# Patient Record
Sex: Male | Born: 1935 | Race: White | Hispanic: No | Marital: Married | Smoking: Former smoker
Health system: Southern US, Community
[De-identification: ages and names within clinical notes are randomized; demographics above are authoritative.]

## PROBLEM LIST (undated history)

## (undated) DIAGNOSIS — R269 Unspecified abnormalities of gait and mobility: Secondary | ICD-10-CM

## (undated) DIAGNOSIS — F0280 Dementia in other diseases classified elsewhere without behavioral disturbance: Secondary | ICD-10-CM

## (undated) DIAGNOSIS — I4891 Unspecified atrial fibrillation: Secondary | ICD-10-CM

## (undated) DIAGNOSIS — Z8673 Personal history of transient ischemic attack (TIA), and cerebral infarction without residual deficits: Secondary | ICD-10-CM

## (undated) DIAGNOSIS — K219 Gastro-esophageal reflux disease without esophagitis: Secondary | ICD-10-CM

## (undated) DIAGNOSIS — N400 Enlarged prostate without lower urinary tract symptoms: Secondary | ICD-10-CM

## (undated) DIAGNOSIS — I872 Venous insufficiency (chronic) (peripheral): Secondary | ICD-10-CM

## (undated) DIAGNOSIS — G309 Alzheimer's disease, unspecified: Secondary | ICD-10-CM

## (undated) DIAGNOSIS — E785 Hyperlipidemia, unspecified: Secondary | ICD-10-CM

## (undated) DIAGNOSIS — F0391 Unspecified dementia with behavioral disturbance: Secondary | ICD-10-CM

## (undated) DIAGNOSIS — J309 Allergic rhinitis, unspecified: Secondary | ICD-10-CM

## (undated) DIAGNOSIS — E739 Lactose intolerance, unspecified: Secondary | ICD-10-CM

## (undated) DIAGNOSIS — Z7901 Long term (current) use of anticoagulants: Principal | ICD-10-CM

## (undated) HISTORY — DX: Alzheimer's disease, unspecified: G30.9

## (undated) HISTORY — DX: Hyperlipidemia, unspecified: E78.5

## (undated) HISTORY — DX: Unspecified atrial fibrillation: I48.91

## (undated) HISTORY — PX: HERNIA REPAIR: SHX51

## (undated) HISTORY — DX: Allergic rhinitis, unspecified: J30.9

## (undated) HISTORY — DX: Personal history of transient ischemic attack (TIA), and cerebral infarction without residual deficits: Z86.73

## (undated) HISTORY — DX: Long term (current) use of anticoagulants: Z79.01

## (undated) HISTORY — DX: Dementia in other diseases classified elsewhere without behavioral disturbance: F02.80

## (undated) HISTORY — DX: Benign prostatic hyperplasia without lower urinary tract symptoms: N40.0

## (undated) HISTORY — PX: CATARACT EXTRACTION W/ INTRAOCULAR LENS  IMPLANT, BILATERAL: SHX1307

## (undated) HISTORY — DX: Lactose intolerance, unspecified: E73.9

## (undated) HISTORY — DX: Gastro-esophageal reflux disease without esophagitis: K21.9

## (undated) HISTORY — DX: Venous insufficiency (chronic) (peripheral): I87.2

## (undated) HISTORY — DX: Unspecified abnormalities of gait and mobility: R26.9

## (undated) HISTORY — DX: Unspecified dementia with behavioral disturbance: F03.91

---

## 1997-10-06 ENCOUNTER — Encounter: Admission: RE | Admit: 1997-10-06 | Discharge: 1998-01-04 | Payer: Self-pay | Admitting: Orthopaedic Surgery

## 1998-03-16 ENCOUNTER — Encounter: Admission: RE | Admit: 1998-03-16 | Discharge: 1998-06-14 | Payer: Self-pay | Admitting: Orthopaedic Surgery

## 1998-07-08 HISTORY — PX: KNEE SURGERY: SHX244

## 1999-07-09 DIAGNOSIS — Z8673 Personal history of transient ischemic attack (TIA), and cerebral infarction without residual deficits: Secondary | ICD-10-CM

## 1999-07-09 HISTORY — DX: Personal history of transient ischemic attack (TIA), and cerebral infarction without residual deficits: Z86.73

## 1999-09-11 ENCOUNTER — Inpatient Hospital Stay (HOSPITAL_COMMUNITY): Admission: EM | Admit: 1999-09-11 | Discharge: 1999-09-15 | Payer: Self-pay | Admitting: Emergency Medicine

## 1999-09-11 ENCOUNTER — Encounter: Payer: Self-pay | Admitting: Emergency Medicine

## 1999-09-11 ENCOUNTER — Encounter: Payer: Self-pay | Admitting: Neurology

## 1999-10-28 ENCOUNTER — Encounter: Payer: Self-pay | Admitting: Neurology

## 1999-10-28 ENCOUNTER — Ambulatory Visit (HOSPITAL_COMMUNITY): Admission: RE | Admit: 1999-10-28 | Discharge: 1999-10-28 | Payer: Self-pay | Admitting: Neurology

## 1999-11-06 ENCOUNTER — Encounter: Admission: RE | Admit: 1999-11-06 | Discharge: 2000-02-04 | Payer: Self-pay | Admitting: Internal Medicine

## 2000-08-17 ENCOUNTER — Encounter: Payer: Self-pay | Admitting: Emergency Medicine

## 2000-08-17 ENCOUNTER — Inpatient Hospital Stay (HOSPITAL_COMMUNITY): Admission: EM | Admit: 2000-08-17 | Discharge: 2000-08-18 | Payer: Self-pay | Admitting: Emergency Medicine

## 2002-03-11 ENCOUNTER — Encounter: Payer: Self-pay | Admitting: Internal Medicine

## 2002-03-11 ENCOUNTER — Encounter: Admission: RE | Admit: 2002-03-11 | Discharge: 2002-03-11 | Payer: Self-pay | Admitting: Internal Medicine

## 2004-09-04 ENCOUNTER — Inpatient Hospital Stay (HOSPITAL_BASED_OUTPATIENT_CLINIC_OR_DEPARTMENT_OTHER): Admission: RE | Admit: 2004-09-04 | Discharge: 2004-09-04 | Payer: Self-pay | Admitting: Interventional Cardiology

## 2007-07-09 HISTORY — PX: RETINAL DETACHMENT SURGERY: SHX105

## 2008-04-11 ENCOUNTER — Ambulatory Visit (HOSPITAL_COMMUNITY): Admission: RE | Admit: 2008-04-11 | Discharge: 2008-04-11 | Payer: Self-pay | Admitting: Ophthalmology

## 2008-08-31 ENCOUNTER — Encounter: Admission: RE | Admit: 2008-08-31 | Discharge: 2008-08-31 | Payer: Self-pay | Admitting: Neurology

## 2010-11-20 NOTE — Op Note (Signed)
[name], Bryan           ACCOUNT NO.:  [id number]   MEDICAL RECORD NO.:  [id number]          PATIENT TYPE:  AMB   LOCATION:  SDS                          FACILITY:  [facility]   PHYSICIAN:  Clent Demark. Rankin, M.D.   DATE OF BIRTH:  1923/06/26   DATE OF PROCEDURE:  04/11/2008  DATE OF DISCHARGE:                               OPERATIVE REPORT   PREOPERATIVE DIAGNOSES:  1. Epiretinal membrane with macular topographic distortion, right eye.  2. Macular degeneration, right eye, underlying.   POSTOPERATIVE DIAGNOSES:  1. Epiretinal membrane with macular topographic distortion, right eye.  2. Macular degeneration, right eye, underlying.   PROCEDURE:  1. Posterior vitrectomy with membrane peel, right eye - 25 gauge.  2. Endolaser photocoagulation for retinal break found temporal to the      macula, right eye.   SURGEON:  Clent Demark. Rankin, MD.   ANESTHESIA:  Local retrobulbar with anesthesia control.   INDICATIONS FOR PROCEDURE:  The patient is an 77 year old man who has  significant impairment of activities of daily living and in the right  eye on the basis of epiretinal membrane, macular topographic distortion,  and loss of visual acuity.  The patient says this was an attempt to move  the macular tractional changes.  He understands the risks of anesthesia  including the rare occurrence of death, loss of the eye including but  not limited to hemorrhage, infection, scarring, need for another  surgery, no change in vision, loss of vision, or progressive disease  despite intervention.  After appropriate signed consent was obtained,  the patient was taken to the operating room.   In the operating room, appropriate monitors followed by mild sedation.  Local retrobulbar with 2% Xylocaine injected 5 mL, with additional 5 mL  laterally in fashion of modified Kirk Ruths.  The right periocular region  was sterilely prepped and draped in the usual sterile fashion.  A lid  speculum applied.  A  25-gauge trocar placed in the inferotemporal  quadrant.  The infusion was turned on.  Superior trocars were applied.  Cauterization was then begun.  Posterior vitreous __________ was  identified and removed.  Vitreous skirt trimmed to 360 degrees.  A 25-  gauge forceps were then used to engage what appeared to be likely an  epiretinal membranes intermittently the internal limiting membrane.  A  single continuous tear area proximally 4 disk areas in extent was  removed as one sheet off the entire foveal macular region for an area of  approximately a 1000 microns approximately 1 disk area temporal to the  fovea and 1 disk area circumferentially.  Small retinal hole was  identified at the temple edges of this area, the __________  juncture  and this was actually treated with endolaser photocoagulation.  Fluid-  air exchange completed.  Laser photocoagulation completed in this area.  At this time, a decision was made to leave dilute SF6 in place.  SF6  15% was left in place after an air/SF6 exchange.  Superior trocars were  removed.  The infusion was removed.  Subconjunctival Decadron applied.  Sterile patch and Fox shield were  applied. The patient tolerated the  procedure well without complications.      Clent Demark Rankin, M.D.  Electronically Signed     GAR/MEDQ  D:  04/11/2008  T:  04/12/2008  Job:  [id number]

## 2010-11-23 NOTE — Discharge Summary (Signed)
[place]. [facility]  Patient:    Bryan [name], Bryan [name]                    MRN: [id number] Adm. Date:  [id number] Disc. Date: [id number] Attending:  Blima Ledger CC:         Bryan [name], M.D.                           Discharge Summary  PRIMARY CARE PHYSICIAN:  Bryan [name], M.D.  DIAGNOSES: 1. Right middle cerebral artery distribution infarction. 2. Right middle cerebral artery occlusion. 3. Hiatal hernia.  PROCEDURES AND INTERVENTIONS: 1. CT scan of the head. 2. MRI/MRA of the brain. 3. Transesophageal echocardiogram.  SUMMARY OF HOSPITALIZATION:  The patient is a 77 year old man who presented to the emergency room after waking up early the morning of admission with changes in neurological status.  He was unable to walk.  He lost his balance and fell on the floor without loosing consciousness.  He wife referred that he was drooling on the left side, and he had slurred speech as well.  This patient has not had a prior history of strokes, TIAs, or significant vascular disease.  ADMISSION PHYSICAL EXAMINATION:  GENERAL:  Upon admission, his examination showed an awake and alert patient.  HEENT:  He had dysarthric speech and left facial central paresis.  EXTREMITIES:  He had a left hemiparesis involving the arm more than the leg. The strength in the left upper extremity was 3/5, and left lower extremity was 4/5.  INITIAL IMAGING STUDIES:  Studies showed an overly hypodense area in the distribution of the arterial branch of the right middle cerebral artery.  HOSPITAL COURSE:  He was admitted to the hospital for further workup and testing for cerebrovascular disease.  Initial management consisted of hemodynamic support, IV fluids, normal saline 100 cc/hr, oxygen 2 L, and heparin ischemic stroke protocol.  The patient underwent testing including MRI/MRA of the brain, which showed an occlusion of the branches of the right middle cerebral artery.   An area of infarction in the right insular cortex and temporal lobe as well.  A transesophageal echocardiogram showed a normal ejection fraction, mild mitral regurgitation, no embolic sources of the cause of a stroke, and no PFO.  During his hospitalization, the patients neurological status improved significantly.  His muscle strength in the left upper extremity improved to a slight drift with the arm and left lower extremity was stronger as well, close to 5/5.  He still remained with mild facial paresis.  He was evaluated by PT, OT, speech therapy, and home health outpatient therapy of PT and OT was recommended.  During his last day of hospitalization, he was slightly confused in regards to location, but otherwise, his mental abilities were preserved. His neurological exam was unchanged, and it was attributed to environment deprivation.  Since the patients MCA occlusion most likely occurred within the last onset of symptoms, it was decided to prescribe Coumadin for secondary stroke prevention, along with Plavix 75 mg q.d., since the patient had already been on aspirin prior to this admission.  Plans are to reschedule neuroimaging studies of the right MCA ideally, and a cerebral catheterization and angiography to determine the presence of any underlying stenosis of the right middle cerebral artery.  CONDITION ON DISCHARGE:  The patient was discharged home in stable condition.  DISCHARGE MEDICATIONS: 1. Zantac 150 once a day. 2. Plavix 75  mg once a day. 3. Coumadin 5 mg once a day.  SPECIAL INSTRUCTIONS:  PT/INR needs to be drawn on September 17, 1999, and the results need to be communicated to Dr. Alysia Penna. [name] for adjustment of PT/INR.  Dose of Coumadin:  The patient was discharged on 5 mg once a day.  FOLLOWUP:  The patient is to be followed up with Dr. [name] in 2-3 weeks from discharge and Dr. [name] in four weeks from discharge. DD:  09/15/99 TD:  09/16/99 Job:  [id number]

## 2010-11-23 NOTE — H&P (Signed)
[place]. [facility]  Patient:    Bryan [name], Bryan [name]                    MRN: [id number] Adm. Date:  [id number] Attending:  Lajean Saver                         History and Physical  CHIEF COMPLAINT:  [place] on the floor, out of balance.  HISTORY OF PRESENT ILLNESS:  The patient is a 77 year old man who woke up early  this morning around 8 a.m.   When he tried to get out of the bed he lost his balance and fell on the floor without losing consciousness.  His wife refers he had been drooling on the side, and also had slurred speech as well.  The patient walked towards the kitchen and was staggering, and he fell over the counter.  He also needed lots of assistance to get into the car.  There is no prior history of strokes or TIAs.  As per the wife, last night when he went to bed he was in his  normal neurological status.  There is no history of double vision, visual changes, or vertigo.  PAST MEDICAL HISTORY: 1. Hiatal hernia. 2. Right knee replacement. 3. Varicose veins.  CURRENT MEDICATIONS:   Zantac 150 mg once q.d.  ALLERGIES:  PENICILLIN.  His primary care physician is Dr. Jeneen Rinks C. Maxwell Caul, phone [phone].  SOCIAL HISTORY:  He lives at home with his wife.  He is a very active individual. He quit smoking 25 years ago.  He drinks a Scotch, about 2-3 ounces, on a daily  basis.  FAMILY HISTORY:  Father and brother with a stroke.  REVIEW OF SYSTEMS:  He refers a mild dull headache on the right temporoparietal  area.  He denies chest pain or shortness of breath.  No fever, no loss of consciousness, no abdominal pain.  PHYSICAL EXAMINATION:  VITAL SIGNS:  Blood pressure 131/70, pulse 54, respirations 20, temperature 97.2 degrees.  GENERAL:  A well-developed individual laying on the stretcher, in no distress.  HEENT:  Head normocephalic, atraumatic.  NECK:  Supple, no bruits.  LUNGS:  Clear bilaterally.  HEART:  Sounds, regular rhythm  without murmurs.  ABDOMEN:  Soft, bowel sounds present.  No visceromegaly.  EXTREMITIES:  Varicose veins.  No cyanosis or edema.  NEUROLOGIC:  Awake, alert, oriented.  His speech is dysarthric.  Memory and language are intact.  Visual fields are full to confrontation.  Pupils equal, reactive bilaterally.  Extraocular cephalic movements present.  Face is asymmetric, with a left facial central paresis.  Tongue is in the midline.  Palate elevates  symmetrically.  Motor examination displays a left hemiparesis, left upper extremity 3/5, left lower extremity 4/5.  Deep tendon reflexes +1 throughout.  Plantars downgoing.  Coordination:  Slight tremor on finger-to-nose in the left upper extremity.  Sensory intact to touch and pinprick.  Gait evaluation was deferred at this time.  NEURO IMAGING:  I have personally reviewed a CT scan of the patients brain which showed a hypodensity in the area of the distribution of a branch of the anterior division of the right middle cerebral artery.  IMPRESSION: 1. Stroke, right middle cerebral artery distribution. 2. Hiatal hernia.  PLAN/RECOMMENDATIONS:  The diagnosis, condition, and further interventions were  discussed at length with the patient and with his wife by the bedside.  The patient will be admitted to the hospital  to the neuro science workup for further workup and testing for cerebrovascular disease.  Management will consist of hemodynamic support, IV fluids, normal saline at 100 cc an hour, oxygen 2 L, and a heparin ischemic stroke protocol.  Further testing will include neuro imaging of the intracranial and extracranial vessels, with an MRI/MRA of the brain.  Also a cardiac evaluation was recommended with a transesophageal echocardiogram for evaluation of embolic sources as the cause of his stroke.  The patient will remain on heparin until his workup is completed.  Would like to consult speech therapy, occupational therapy, and  physical therapy as well.  Long-term secondary stroke  prevention will be decided on, upon the completion of the testing.  Dr. [name] was notified of the patients admission. DD:  09/11/99 TD:  09/11/99 Job: [id number]

## 2010-11-23 NOTE — Cardiovascular Report (Signed)
NAMEPOUL, WOOLRIDGE NO.:  [id number]   MEDICAL RECORD NO.:  [id number]          PATIENT TYPE:  OIB   LOCATION:  6501                         FACILITY:  [facility]   PHYSICIAN:  Belva Crome III, M.D.DATE OF BIRTH:  [birth year]   DATE OF PROCEDURE:  09/04/2004  DATE OF DISCHARGE:                              CARDIAC CATHETERIZATION   INDICATIONS:  Mildly abnormal Cardiolite study demonstrating inferobasal  ischemia.   PROCEDURE PERFORMED:  1.  Left heart catheterization  2.  Selective coronary angiography.  3.  Left ventriculography.   DESCRIPTION:  After informed consent, a 4-French sheath was placed via the  right femoral artery using modified Seldinger technique.  A 4-French, A-2,  multipurpose catheter was then used for hemodynamic recordings, left  ventriculography by hand injection, selective right coronary angiography.  A  100 mcg of intracoronary nitroglycerin was administered.  A 4-French, left  Judkins catheter was used for left coronary angiography.  The patient  tolerated procedure without complications.   RESULTS:  Left ventriculography:  Left ventricle was faintly opacified by  hand injection.  LVEF is estimated to be greater than 6%.  No obvious mitral  regurgitation.   Hemodynamic Data:  1.  Aortic pressure 132/61.  2.  Left ventricular pressure 130/12.   Coronary angiography:  1.  Left main coronary:  Left the left main coronary artery is large and      trifurcates into LAD, circumflex and LAD, ramus and circumflex.  No      obstruction is noted.  Mild calcification is noted.  2.  LAD:  The LAD is transapical.  There are luminal irregularities in the      proximal vessel with calcification present.  The first diagonal      contained 50-70% narrowing.  This vessel is relatively small. There is a      small diagonal that arises from the mid vessel.  This vessel is widely      patent.  The LAD is transapical.  No significant obstruction is  noted on      LAD.  3.  Ramus branch:  The ramus intermedius is a large vessel with no      significant obstruction noted.  Irregularities are noted in the mid      vessel.  It bifurcates on the lateral wall.  4.  Circumflex artery:  The circumflex coronary artery is moderate in size      and gives origin to a small obtuse marginal-1 and small distal obtuse      marginal-2.  It also gives a left atrial recurrent branch.  There is      segmental 50-60% ostial and proximal narrowing.  No high-grade      obstruction is felt to be present.  5.  Right coronary:  The right coronary artery is a large vessel giving PDA      and two left ventricular branches.  The mid vessel contains ectasia and      luminal irregularities with up to 30-40% narrowing.  There is an      eccentric distal RCA stenosis before the PDA that  contains 50-70%      narrowing depending upon the view.  This could potentially be a region      that could represent prior plaque rupture. The PDA and LV branches are      free of any significant obstruction.   CONCLUSION:  1.  Moderate coronary artery disease with borderline significant distal      right coronary artery.  There is mild to moderate circumflex disease as      noted above.  The left anterior descending and ramus branch are free of      significant obstruction.  2.  Normal left ventricular function.   PLAN:  Risk factor modification, aspirin, sublingual nitroglycerin for  current pain.  Consider repeat Cardiolite study 6-12 months to reassess the  distal RCA.  He should use sublingual nitroglycerin with recurrent chest  discomfort.  If he has recurrent chest discomfort, he should report this.      HWS/MEDQ  D:  09/04/2004  T:  09/04/2004  Job:  [id number]   cc:   Nehemiah Settle, M.D.  [address]  [place] [address]  Fax: [phone]

## 2011-04-09 LAB — CBC
HCT: 47.3
Hemoglobin: 15.6
MCHC: 33
MCV: 91.5
RDW: 14.4

## 2011-04-09 LAB — BASIC METABOLIC PANEL
CO2: 23
Calcium: 9
Chloride: 107
Glucose, Bld: 81
Sodium: 137

## 2011-04-09 LAB — PROTIME-INR: INR: 2.1 — ABNORMAL HIGH

## 2011-07-09 DIAGNOSIS — F0280 Dementia in other diseases classified elsewhere without behavioral disturbance: Secondary | ICD-10-CM

## 2011-07-09 HISTORY — DX: Dementia in other diseases classified elsewhere, unspecified severity, without behavioral disturbance, psychotic disturbance, mood disturbance, and anxiety: F02.80

## 2011-07-10 DIAGNOSIS — Z7901 Long term (current) use of anticoagulants: Secondary | ICD-10-CM | POA: Diagnosis not present

## 2011-08-07 DIAGNOSIS — Z7901 Long term (current) use of anticoagulants: Secondary | ICD-10-CM | POA: Diagnosis not present

## 2011-09-04 DIAGNOSIS — Z7901 Long term (current) use of anticoagulants: Secondary | ICD-10-CM | POA: Diagnosis not present

## 2011-09-12 DIAGNOSIS — B079 Viral wart, unspecified: Secondary | ICD-10-CM | POA: Diagnosis not present

## 2011-10-02 DIAGNOSIS — Z7901 Long term (current) use of anticoagulants: Secondary | ICD-10-CM | POA: Diagnosis not present

## 2011-10-09 DIAGNOSIS — H353190 Nonexudative age-related macular degeneration, unspecified eye, stage unspecified: Secondary | ICD-10-CM | POA: Diagnosis not present

## 2011-10-09 DIAGNOSIS — H40019 Open angle with borderline findings, low risk, unspecified eye: Secondary | ICD-10-CM | POA: Diagnosis not present

## 2011-10-09 DIAGNOSIS — H26499 Other secondary cataract, unspecified eye: Secondary | ICD-10-CM | POA: Diagnosis not present

## 2011-10-09 DIAGNOSIS — H43819 Vitreous degeneration, unspecified eye: Secondary | ICD-10-CM | POA: Diagnosis not present

## 2011-10-09 DIAGNOSIS — H35359 Cystoid macular degeneration, unspecified eye: Secondary | ICD-10-CM | POA: Diagnosis not present

## 2011-10-09 DIAGNOSIS — H35379 Puckering of macula, unspecified eye: Secondary | ICD-10-CM | POA: Diagnosis not present

## 2011-10-11 DIAGNOSIS — F0281 Dementia in other diseases classified elsewhere with behavioral disturbance: Secondary | ICD-10-CM | POA: Diagnosis not present

## 2011-10-11 DIAGNOSIS — M6281 Muscle weakness (generalized): Secondary | ICD-10-CM | POA: Diagnosis not present

## 2011-10-11 DIAGNOSIS — R488 Other symbolic dysfunctions: Secondary | ICD-10-CM | POA: Diagnosis not present

## 2011-10-11 DIAGNOSIS — R279 Unspecified lack of coordination: Secondary | ICD-10-CM | POA: Diagnosis not present

## 2011-10-21 DIAGNOSIS — R279 Unspecified lack of coordination: Secondary | ICD-10-CM | POA: Diagnosis not present

## 2011-10-21 DIAGNOSIS — M6281 Muscle weakness (generalized): Secondary | ICD-10-CM | POA: Diagnosis not present

## 2011-10-21 DIAGNOSIS — F0281 Dementia in other diseases classified elsewhere with behavioral disturbance: Secondary | ICD-10-CM | POA: Diagnosis not present

## 2011-10-21 DIAGNOSIS — R488 Other symbolic dysfunctions: Secondary | ICD-10-CM | POA: Diagnosis not present

## 2011-10-23 DIAGNOSIS — M6281 Muscle weakness (generalized): Secondary | ICD-10-CM | POA: Diagnosis not present

## 2011-10-23 DIAGNOSIS — R488 Other symbolic dysfunctions: Secondary | ICD-10-CM | POA: Diagnosis not present

## 2011-10-23 DIAGNOSIS — F0281 Dementia in other diseases classified elsewhere with behavioral disturbance: Secondary | ICD-10-CM | POA: Diagnosis not present

## 2011-10-23 DIAGNOSIS — R279 Unspecified lack of coordination: Secondary | ICD-10-CM | POA: Diagnosis not present

## 2011-10-25 DIAGNOSIS — R279 Unspecified lack of coordination: Secondary | ICD-10-CM | POA: Diagnosis not present

## 2011-10-25 DIAGNOSIS — M6281 Muscle weakness (generalized): Secondary | ICD-10-CM | POA: Diagnosis not present

## 2011-10-25 DIAGNOSIS — F0281 Dementia in other diseases classified elsewhere with behavioral disturbance: Secondary | ICD-10-CM | POA: Diagnosis not present

## 2011-10-25 DIAGNOSIS — R488 Other symbolic dysfunctions: Secondary | ICD-10-CM | POA: Diagnosis not present

## 2011-10-30 DIAGNOSIS — Z7901 Long term (current) use of anticoagulants: Secondary | ICD-10-CM | POA: Diagnosis not present

## 2011-11-01 DIAGNOSIS — R488 Other symbolic dysfunctions: Secondary | ICD-10-CM | POA: Diagnosis not present

## 2011-11-01 DIAGNOSIS — F0281 Dementia in other diseases classified elsewhere with behavioral disturbance: Secondary | ICD-10-CM | POA: Diagnosis not present

## 2011-11-01 DIAGNOSIS — R279 Unspecified lack of coordination: Secondary | ICD-10-CM | POA: Diagnosis not present

## 2011-11-01 DIAGNOSIS — M6281 Muscle weakness (generalized): Secondary | ICD-10-CM | POA: Diagnosis not present

## 2011-11-20 DIAGNOSIS — Z7901 Long term (current) use of anticoagulants: Secondary | ICD-10-CM | POA: Diagnosis not present

## 2011-12-18 DIAGNOSIS — Z7901 Long term (current) use of anticoagulants: Secondary | ICD-10-CM | POA: Diagnosis not present

## 2011-12-19 DIAGNOSIS — G309 Alzheimer's disease, unspecified: Secondary | ICD-10-CM | POA: Diagnosis not present

## 2011-12-19 DIAGNOSIS — F0280 Dementia in other diseases classified elsewhere without behavioral disturbance: Secondary | ICD-10-CM | POA: Diagnosis not present

## 2011-12-24 DIAGNOSIS — R413 Other amnesia: Secondary | ICD-10-CM | POA: Diagnosis not present

## 2011-12-30 DIAGNOSIS — L0390 Cellulitis, unspecified: Secondary | ICD-10-CM | POA: Diagnosis not present

## 2011-12-30 DIAGNOSIS — L0291 Cutaneous abscess, unspecified: Secondary | ICD-10-CM | POA: Diagnosis not present

## 2012-01-07 DIAGNOSIS — R109 Unspecified abdominal pain: Secondary | ICD-10-CM | POA: Diagnosis not present

## 2012-02-20 DIAGNOSIS — H353190 Nonexudative age-related macular degeneration, unspecified eye, stage unspecified: Secondary | ICD-10-CM | POA: Diagnosis not present

## 2012-02-20 DIAGNOSIS — H43819 Vitreous degeneration, unspecified eye: Secondary | ICD-10-CM | POA: Diagnosis not present

## 2012-02-20 DIAGNOSIS — H35359 Cystoid macular degeneration, unspecified eye: Secondary | ICD-10-CM | POA: Diagnosis not present

## 2012-02-20 DIAGNOSIS — H35049 Retinal micro-aneurysms, unspecified, unspecified eye: Secondary | ICD-10-CM | POA: Diagnosis not present

## 2012-02-20 DIAGNOSIS — H35369 Drusen (degenerative) of macula, unspecified eye: Secondary | ICD-10-CM | POA: Diagnosis not present

## 2012-03-24 DIAGNOSIS — Z7901 Long term (current) use of anticoagulants: Secondary | ICD-10-CM | POA: Diagnosis not present

## 2012-04-14 DIAGNOSIS — Z7901 Long term (current) use of anticoagulants: Secondary | ICD-10-CM | POA: Diagnosis not present

## 2012-04-21 DIAGNOSIS — Z23 Encounter for immunization: Secondary | ICD-10-CM | POA: Diagnosis not present

## 2012-06-11 DIAGNOSIS — R339 Retention of urine, unspecified: Secondary | ICD-10-CM | POA: Diagnosis not present

## 2012-06-16 DIAGNOSIS — R413 Other amnesia: Secondary | ICD-10-CM | POA: Diagnosis not present

## 2012-06-16 DIAGNOSIS — I2510 Atherosclerotic heart disease of native coronary artery without angina pectoris: Secondary | ICD-10-CM | POA: Diagnosis not present

## 2012-06-16 DIAGNOSIS — Z79899 Other long term (current) drug therapy: Secondary | ICD-10-CM | POA: Diagnosis not present

## 2012-06-16 DIAGNOSIS — Z7901 Long term (current) use of anticoagulants: Secondary | ICD-10-CM | POA: Diagnosis not present

## 2012-06-16 DIAGNOSIS — Z1331 Encounter for screening for depression: Secondary | ICD-10-CM | POA: Diagnosis not present

## 2012-07-07 DIAGNOSIS — Z7901 Long term (current) use of anticoagulants: Secondary | ICD-10-CM | POA: Diagnosis not present

## 2012-07-13 DIAGNOSIS — Z7901 Long term (current) use of anticoagulants: Secondary | ICD-10-CM | POA: Diagnosis not present

## 2012-08-10 DIAGNOSIS — Z7901 Long term (current) use of anticoagulants: Secondary | ICD-10-CM | POA: Diagnosis not present

## 2012-09-07 DIAGNOSIS — Z7901 Long term (current) use of anticoagulants: Secondary | ICD-10-CM | POA: Diagnosis not present

## 2012-10-05 DIAGNOSIS — Z7901 Long term (current) use of anticoagulants: Secondary | ICD-10-CM | POA: Diagnosis not present

## 2012-10-26 DIAGNOSIS — Z7901 Long term (current) use of anticoagulants: Secondary | ICD-10-CM | POA: Diagnosis not present

## 2012-11-16 ENCOUNTER — Non-Acute Institutional Stay: Payer: Medicare Other | Admitting: Geriatric Medicine

## 2012-11-16 DIAGNOSIS — R269 Unspecified abnormalities of gait and mobility: Secondary | ICD-10-CM | POA: Diagnosis not present

## 2012-11-16 DIAGNOSIS — N400 Enlarged prostate without lower urinary tract symptoms: Secondary | ICD-10-CM | POA: Diagnosis not present

## 2012-11-16 DIAGNOSIS — Z7901 Long term (current) use of anticoagulants: Secondary | ICD-10-CM | POA: Diagnosis not present

## 2012-11-16 DIAGNOSIS — F0280 Dementia in other diseases classified elsewhere without behavioral disturbance: Secondary | ICD-10-CM

## 2012-11-16 DIAGNOSIS — G309 Alzheimer's disease, unspecified: Secondary | ICD-10-CM

## 2012-11-16 DIAGNOSIS — I4891 Unspecified atrial fibrillation: Secondary | ICD-10-CM | POA: Diagnosis not present

## 2012-11-16 NOTE — Progress Notes (Deleted)
Patient ID: Bryan [name], male   DOB: 1923/04/01, 79 y.o.   MRN: [id number]  Code Status: ***  Allergies  Allergen Reactions  . Penicillins Rash    Chief Complaint: ***  HPI:  ***  No past medical history on file. No past surgical history on file. Social History:   has no tobacco, alcohol, and drug history on file.  No family history on file.  Medications: Patient's Medications   No medications on file    Review of Systems:  ***   There were no vitals filed for this visit. Physical Exam: ***    Labs reviewed: Basic Metabolic Panel: No results found for this basename: NA, K, CL, CO2, GLUCOSE, BUN, CREATININE, CALCIUM, MG, PHOS,  in the last 8760 hours Liver Function Tests: No results found for this basename: AST, ALT, ALKPHOS, BILITOT, PROT, ALBUMIN,  in the last 8760 hours No results found for this basename: LIPASE, AMYLASE,  in the last 8760 hours No results found for this basename: AMMONIA,  in the last 8760 hours CBC: No results found for this basename: WBC, NEUTROABS, HGB, HCT, MCV, PLT,  in the last 8760 hours Cardiac Enzymes: No results found for this basename: CKTOTAL, CKMB, CKMBINDEX, TROPONINI,  in the last 8760 hours BNP: No components found with this basename: POCBNP,  CBG: No results found for this basename: GLUCAP,  in the last 8760 hours  Radiological Exams:   EKG: Independently reviewed. ***  Assessment/Plan No problem-specific assessment & plan notes found for this encounter.     {Rehab current functional status:30803}  Family/ staff Communication: ***   Goals of care: ***   Labs/tests ordered: CBC,CMP, Lipid panel, TSH, B12 (11/17/12)   Dago Jungwirth T.Rolf Fells, NP-C 11/16/2012

## 2012-11-17 ENCOUNTER — Encounter: Payer: Self-pay | Admitting: Geriatric Medicine

## 2012-11-17 DIAGNOSIS — Z7901 Long term (current) use of anticoagulants: Secondary | ICD-10-CM | POA: Diagnosis not present

## 2012-11-17 DIAGNOSIS — I2510 Atherosclerotic heart disease of native coronary artery without angina pectoris: Secondary | ICD-10-CM | POA: Diagnosis not present

## 2012-11-17 DIAGNOSIS — E781 Pure hyperglyceridemia: Secondary | ICD-10-CM | POA: Diagnosis not present

## 2012-11-17 DIAGNOSIS — I4891 Unspecified atrial fibrillation: Secondary | ICD-10-CM | POA: Diagnosis not present

## 2012-11-17 DIAGNOSIS — R413 Other amnesia: Secondary | ICD-10-CM | POA: Diagnosis not present

## 2012-11-17 DIAGNOSIS — Z79899 Other long term (current) drug therapy: Secondary | ICD-10-CM | POA: Diagnosis not present

## 2012-11-23 ENCOUNTER — Encounter: Payer: Self-pay | Admitting: Geriatric Medicine

## 2012-11-23 DIAGNOSIS — I4891 Unspecified atrial fibrillation: Secondary | ICD-10-CM | POA: Insufficient documentation

## 2012-11-23 DIAGNOSIS — Z7901 Long term (current) use of anticoagulants: Secondary | ICD-10-CM | POA: Insufficient documentation

## 2012-11-23 DIAGNOSIS — F0280 Dementia in other diseases classified elsewhere without behavioral disturbance: Secondary | ICD-10-CM | POA: Insufficient documentation

## 2012-11-23 DIAGNOSIS — R269 Unspecified abnormalities of gait and mobility: Secondary | ICD-10-CM | POA: Insufficient documentation

## 2012-11-23 DIAGNOSIS — N400 Enlarged prostate without lower urinary tract symptoms: Secondary | ICD-10-CM | POA: Insufficient documentation

## 2012-11-23 HISTORY — DX: Long term (current) use of anticoagulants: Z79.01

## 2012-11-23 NOTE — Assessment & Plan Note (Signed)
Patient's gait is unsteady at times.Per family's report, he was unable to walk Sunday. Ambulating okay today. Spouse reports he uses her walker sometimes "appears more secure". Will ask for PT evaluation for gait training, LE strengthening, appropriate assistive device.

## 2012-11-23 NOTE — Assessment & Plan Note (Signed)
Spouse reports patient has had some difficulty with voiding in the past, better since on medication. Review of record shows patient has intermittent incontinence. He's had no recent problem with urinary retention.

## 2012-11-23 NOTE — Assessment & Plan Note (Signed)
INR below therapeutic range today, increase warfarin dose recheck 1 week

## 2012-11-23 NOTE — Assessment & Plan Note (Signed)
Patient has had progressive decline and language, functional status, now requires 24-hour care. MMSE completed last week on [facility] Care admission patient scored 6/30, noted with intermittent incontinence of both bowel and bladder. He requires assistance with dressing toileting and personal hygiene. Interactions with staff and other residents has been pleasant. Family remains very involved

## 2012-11-23 NOTE — Assessment & Plan Note (Addendum)
Long-standing PAF, currently rate controlled off medication

## 2012-11-23 NOTE — Progress Notes (Signed)
Patient ID: Bryan [name], male   DOB: [birth year], 79 y.o.   MRN: [id number] [facility] (13)Memory Care  Code Status: Living Will, Full Code  Allergies  Allergen Reactions  . Penicillins Rash    Chief Complaint  Patient presents with  . Dementia  . Coagulation status    HPI: This is a 79 y.o. male resident of [facility] moved to  [facility] section last week. He is evaluated today as a new patient. His primary care provider has been Dr. Nehemiah Settle, family has requested change to [facility] with this Memory Care admission. Patient's main medical issues include Alzheimer's dementia, depression, gait instability, chronic anticoagulation related to previous CVA. The patient's wife has been the primary caregiver, however his functional status has declined such that she can no longer manage at home.    Past Medical History  Diagnosis Date  . Hyperlipidemia   . Alzheimer's disease 2013  . GERD (gastroesophageal reflux disease)   . BPH (benign prostatic hyperplasia)   . Atrial fibrillation     Long term anti coagulation w/ warfarin fro stroke risk reduction  . 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 Lactose intolerance   . Allergic rhinitis   . Long term (current) use of anticoagulants 11/23/2012    Long-term anticoagulation with warfarin for stroke risk reduction related to AF  . Gait disorder    Past Surgical History  Procedure Laterality Date  . Cataract extraction w/ intraocular lens  implant, bilateral    . Hernia repair Right 1970s  . Retinal detachment surgery Right 2009  . Knee surgery Right 2000   Social History:   History   Social History Narrative   Married. 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. Patient's bowels have resided at [facility] since June 2010. Patient recently moved to the Memory Care sections and community. He quit smoking 1972, drinks an occasional  alcoholic drink.   Has a living will and healthcare power of attorney.   No family history on file.  Medications: Current outpatient prescriptions:donepezil (ARICEPT) 10 MG tablet, Take 10 mg by mouth daily., Disp: , Rfl: ;  fluticasone (VERAMYST) 27.5 MCG/SPRAY nasal spray, Place 2 sprays into the nose daily., Disp: , Rfl: ;  LORazepam (ATIVAN) 0.5 MG tablet, Take 0.5 mg by mouth daily. After dinner, Disp: , Rfl: ;  Memantine HCl ER (NAMENDA XR) 28 MG CP24, Take 1 capsule by mouth daily., Disp: , Rfl:  nitroGLYCERIN (NITROSTAT) 0.4 MG SL tablet, Place 0.4 mg under the tongue every 5 (five) minutes as needed for chest pain., Disp: , Rfl: ;  omeprazole (PRILOSEC) 20 MG capsule, Take 20 mg by mouth daily., Disp: , Rfl: ;  simvastatin (ZOCOR) 20 MG tablet, Take 20 mg by mouth every evening., Disp: , Rfl: ;  tamsulosin (FLOMAX) 0.4 MG CAPS, Take 0.4 mg by mouth daily after supper., Disp: , Rfl:  vitamin B-12 (CYANOCOBALAMIN) 1000 MCG tablet, Take 1,000 mcg by mouth daily., Disp: , Rfl: ;  warfarin (COUMADIN) 3 MG tablet, Take 3 mg by mouth as directed. 3mg  M,F, Disp: , Rfl: ;  warfarin (COUMADIN) 4 MG tablet, Take 4 mg by mouth as directed. 4mg  S,Tu,We,Th,Sa, Disp: , Rfl:   Review of Systems:  DATA OBTAINED: from patient, nurse, medical record, family member GENERAL: Feels well No fevers, fatigue, change in appetite or weight SKIN: No itch, rash or open wounds EYES: No eye pain, dryness or itching   EARS:  No earache,  change in hearing NOSE: No congestion, drainage or bleeding MOUTH/THROAT: No mouth or tooth pain  No difficulty chewing or swallowing RESPIRATORY: No cough, wheezing, SOB CARDIAC: No chest pain, No edema. GI: No abdominal pain  No N/V/D or constipation  No heartburn or reflux  GU: No dysuria, frequency or urgency  No change in urine volume or character No nocturia or change in stream   MUSCULOSKELETAL: No joint pain, swelling or stiffness  No back pain  No muscle ache, pain, weakness   Gait is unsteady   NEUROLOGIC: No dizziness, fainting, headache,  No change in mental status (dementia).  PSYCHIATRIC: Sleeps well.  He is easily gets frustrated with his limitations, requires p.r.n. dosing of lorazepam     Filed Vitals:   11/16/12 1106  BP: 129/71  Pulse: 82  Temp: 97.3 F (36.3 C)  Resp: 20  Height: 6\' 1"  (1.854 m)  Weight: 228 lb (103.42 kg)  SpO2: 95%  Body mass index is 30.09 kg/(m^2).  Physical Exam: GENERAL APPEARANCE: No acute distress, appropriately groomed, Overweight body habitus. Alert, pleasant, conversant. HEAD: Normocephalic, atraumatic EYES: Conjunctiva/lids clear. Pupils round, reactive.   EARS: External exam WNL. Hearing decreased. NOSE: No deformity or discharge. MOUTH/THROAT: Lips w/o lesions. Oral mucosa, tongue moist, w/o lesion. Oropharynx w/o redness or lesions.  NECK: Supple, full ROM. No thyroid tenderness, enlargement or nodule LYMPHATICS: No head, neck or supraclavicular adenopathy RESPIRATORY: Breathing is even, unlabored. Lungs with rhonchi throughout, clear with cough. O2 saturation 96%   CARDIOVASCULAR: Heart RRR. No murmur or extra heart sounds  ARTERIAL: No carotid bruit.   VENOUS: No varicosities. No venous stasis skin changes  EDEMA: No peripheral or periorbital edema.  GASTROINTESTINAL: Abdomen is soft, non-tender, not distended w/ normal bowel sounds. No hepatic or splenic enlargement.  MUSCULOSKELETAL: Moves all extremities with full ROM, strength and tone. Back is without kyphosis, scoliosis or spinal process tenderness. Gait is unsteady NEUROLOGIC: Not oriented to time, place. Speech clear,language limited. No tremor.  PSYCHIATRIC: Mood and affect appropriate to situation  Labs reviewed: 11/17/2012: 30 BC 6.4, hemoglobin 14.5, hematocrit 41.1, platelets 151.  INR 1.73  Glucose 100, BUN 20, creatinine 1.05, sodium 138, potassium 4.1. LFTs WNL. Albumin 3.3  Total cholesterol 111, triglycerides 57, HDL 40, LDL 60  TSH  2.8   B. 12-29   Assessment/Plan Long term (current) use of anticoagulants INR below therapeutic range today, increase warfarin dose recheck 1 week  Gait disorder Patient's gait is unsteady at times.Per family's report, he was unable to walk Sunday. Ambulating okay today. Spouse reports he uses her walker sometimes "appears more secure". Will ask for PT evaluation for gait training, LE strengthening, appropriate assistive device.   BPH (benign prostatic hyperplasia) Spouse reports patient has had some difficulty with voiding in the past, better since on medication. Review of record shows patient has intermittent incontinence. He's had no recent problem with urinary retention.  Atrial fibrillation Long-standing PAF, currently rate controlled off medication  Alzheimer's disease Patient has had progressive decline and language, functional status, now requires 24-hour care. MMSE completed last week on Memory Care admission patient scored 6/30, noted with intermittent incontinence of both bowel and bladder. He requires assistance with dressing toileting and personal hygiene. Interactions with staff and other residents has been pleasant. Family remains very involved   Bathing: Moderate Assist, Bed Mobility: Minimal Assist, Toileting / Clothing: Moderate Assist, Transfers: Moderate Assist  Spent 45 minutes with patient/ family. Discussed role/availability of [facility] providers, general goals  of care (limited interventions), focus on quality of life.     Follow up: Routine or as needed Labs/tests ordered: CBC,CMP, Lipid panel, TSH, B12 (11/17/12)  Huntington Leverich T.Saanvi Hakala, NP-C 11/23/2012

## 2012-11-24 DIAGNOSIS — Z7901 Long term (current) use of anticoagulants: Secondary | ICD-10-CM | POA: Diagnosis not present

## 2012-11-26 DIAGNOSIS — R279 Unspecified lack of coordination: Secondary | ICD-10-CM | POA: Diagnosis not present

## 2012-11-26 DIAGNOSIS — R269 Unspecified abnormalities of gait and mobility: Secondary | ICD-10-CM | POA: Diagnosis not present

## 2012-12-01 DIAGNOSIS — Z7901 Long term (current) use of anticoagulants: Secondary | ICD-10-CM | POA: Diagnosis not present

## 2012-12-02 ENCOUNTER — Other Ambulatory Visit: Payer: Self-pay | Admitting: Geriatric Medicine

## 2012-12-02 MED ORDER — LORAZEPAM 0.5 MG PO TABS: 0.5000 mg | ORAL_TABLET | Freq: Every day | ORAL | Status: DC

## 2012-12-15 DIAGNOSIS — Z7901 Long term (current) use of anticoagulants: Secondary | ICD-10-CM | POA: Diagnosis not present

## 2012-12-21 DIAGNOSIS — M6281 Muscle weakness (generalized): Secondary | ICD-10-CM | POA: Diagnosis not present

## 2012-12-21 DIAGNOSIS — R279 Unspecified lack of coordination: Secondary | ICD-10-CM | POA: Diagnosis not present

## 2012-12-21 DIAGNOSIS — R609 Edema, unspecified: Secondary | ICD-10-CM | POA: Diagnosis not present

## 2012-12-22 DIAGNOSIS — M6281 Muscle weakness (generalized): Secondary | ICD-10-CM | POA: Diagnosis not present

## 2012-12-22 DIAGNOSIS — Z7901 Long term (current) use of anticoagulants: Secondary | ICD-10-CM | POA: Diagnosis not present

## 2012-12-22 DIAGNOSIS — R609 Edema, unspecified: Secondary | ICD-10-CM | POA: Diagnosis not present

## 2012-12-22 DIAGNOSIS — R279 Unspecified lack of coordination: Secondary | ICD-10-CM | POA: Diagnosis not present

## 2012-12-23 DIAGNOSIS — R609 Edema, unspecified: Secondary | ICD-10-CM | POA: Diagnosis not present

## 2012-12-23 DIAGNOSIS — R279 Unspecified lack of coordination: Secondary | ICD-10-CM | POA: Diagnosis not present

## 2012-12-23 DIAGNOSIS — M6281 Muscle weakness (generalized): Secondary | ICD-10-CM | POA: Diagnosis not present

## 2012-12-24 DIAGNOSIS — R279 Unspecified lack of coordination: Secondary | ICD-10-CM | POA: Diagnosis not present

## 2012-12-24 DIAGNOSIS — R609 Edema, unspecified: Secondary | ICD-10-CM | POA: Diagnosis not present

## 2012-12-24 DIAGNOSIS — M6281 Muscle weakness (generalized): Secondary | ICD-10-CM | POA: Diagnosis not present

## 2012-12-29 DIAGNOSIS — R279 Unspecified lack of coordination: Secondary | ICD-10-CM | POA: Diagnosis not present

## 2012-12-29 DIAGNOSIS — M6281 Muscle weakness (generalized): Secondary | ICD-10-CM | POA: Diagnosis not present

## 2012-12-29 DIAGNOSIS — R609 Edema, unspecified: Secondary | ICD-10-CM | POA: Diagnosis not present

## 2012-12-31 DIAGNOSIS — M6281 Muscle weakness (generalized): Secondary | ICD-10-CM | POA: Diagnosis not present

## 2012-12-31 DIAGNOSIS — R279 Unspecified lack of coordination: Secondary | ICD-10-CM | POA: Diagnosis not present

## 2012-12-31 DIAGNOSIS — R609 Edema, unspecified: Secondary | ICD-10-CM | POA: Diagnosis not present

## 2013-01-05 DIAGNOSIS — M6281 Muscle weakness (generalized): Secondary | ICD-10-CM | POA: Diagnosis not present

## 2013-01-05 DIAGNOSIS — Z7901 Long term (current) use of anticoagulants: Secondary | ICD-10-CM | POA: Diagnosis not present

## 2013-01-05 DIAGNOSIS — R609 Edema, unspecified: Secondary | ICD-10-CM | POA: Diagnosis not present

## 2013-01-05 DIAGNOSIS — R279 Unspecified lack of coordination: Secondary | ICD-10-CM | POA: Diagnosis not present

## 2013-01-12 ENCOUNTER — Encounter: Payer: Self-pay | Admitting: Geriatric Medicine

## 2013-01-12 ENCOUNTER — Non-Acute Institutional Stay: Payer: Medicare Other | Admitting: Geriatric Medicine

## 2013-01-12 DIAGNOSIS — I4891 Unspecified atrial fibrillation: Secondary | ICD-10-CM | POA: Diagnosis not present

## 2013-01-12 DIAGNOSIS — Z7901 Long term (current) use of anticoagulants: Secondary | ICD-10-CM | POA: Diagnosis not present

## 2013-01-12 DIAGNOSIS — R609 Edema, unspecified: Secondary | ICD-10-CM | POA: Diagnosis not present

## 2013-01-12 DIAGNOSIS — R600 Localized edema: Secondary | ICD-10-CM | POA: Insufficient documentation

## 2013-01-12 DIAGNOSIS — I872 Venous insufficiency (chronic) (peripheral): Secondary | ICD-10-CM

## 2013-01-12 DIAGNOSIS — F0280 Dementia in other diseases classified elsewhere without behavioral disturbance: Secondary | ICD-10-CM

## 2013-01-12 HISTORY — DX: Venous insufficiency (chronic) (peripheral): I87.2

## 2013-01-12 MED ORDER — FUROSEMIDE 20 MG PO TABS: 20.0000 mg | ORAL_TABLET | Freq: Every day | ORAL | Status: DC

## 2013-01-12 NOTE — Assessment & Plan Note (Signed)
No change reported in cognition or functional status. Patient is adjusting very well to Memory Care environment. Family remains engaged and supportive.

## 2013-01-12 NOTE — Assessment & Plan Note (Signed)
Recent INR below therapeutic range, warfarin dose adjusted. Most recent INR in range- next due 01/26/13

## 2013-01-12 NOTE — Assessment & Plan Note (Addendum)
Bilateral LE edema and weight increase in the last month. No sign of DVT, pulmonary edema. No skin redness or breakdown. Both legs with significant venous varicosities. Pt also with chronic AF, rate controlled. Compression hose, ultrasound therapy and attempts at leg elevation have been mildly useful. Add low dose diuretic. Check BMP with next INR

## 2013-01-12 NOTE — Assessment & Plan Note (Signed)
Irregular rhythm, rate conrolled. Copntinue present medication including anticoagulation

## 2013-01-12 NOTE — Progress Notes (Signed)
Patient ID: Bryan [name], male   DOB: 1922-09-10, 79 y.o.   MRN: [id number] [name] [phone])  Chief Complaint  Patient presents with  . Leg Swelling    HPI: This is a 79 y.o. male resident of [facility],  Memory Care section.  Evaluation is requested today due to persistent LE edema and weight gain. OT has been working with patient to reduce edema with use of ultrasound tx, addition of new compression hose and encouraging LE elevation. Review of record notes edema has been 1-2+ in each leg. Vital signs have been stable, weight has increased 12lbs in the last month. After lab results returned in May, simvastatin was decreased due to low cholesterol levels, B12 supplement was added due to borderline low reading. INR was subtherapeutic, warfarin dose was adjusted most, most recent INR is in the range. Review of record shows patient's vital signs have been stable, his p.o. Intake has been excellent, he is resting well at night. He continues to have a caregiver most of the day, frequently is accompanied off the unit for outings. Patient also participates in activities on the unit.  Functional Status: Bathing: Moderate Assist Bed Mobility: Minimal Assist, Toileting / Clothing: Moderate Assist Transfers: Moderate Assist, Ambulation: Supervision   Allergies  Allergen Reactions  . Penicillins Rash   Medications Reviewed  DATA REVIEWED  Laboratory Studies: [facility], external 11/17/2012: 30 BC 6.4, hemoglobin 14.5, hematocrit 41.1, platelets 151.   INR 1.73   Glucose 100, BUN 20, creatinine 1.05, sodium 138, potassium 4.1. LFTs WNL. Albumin 3.3   Total cholesterol 111, triglycerides 57, HDL 40, LDL 60   TSH 2.8   B12 229  12/01/12 INR 2.33 12/15/12 INR 1.73  6/17 INR 1.79 01/05/13 INR 2.23     Review of Systems  DATA OBTAINED: from patient, nurse, medical record, caregiver GENERAL: Feels well   No fevers, fatigue, change in appetite, Weight  has increased SKIN: No itch, rash or open wounds EYES: No eye pain, dryness or itching  No change in vision EARS: No earache, tinnitus, change in hearing NOSE: No congestion, drainage or bleeding MOUTH/THROAT: No mouth or tooth pain  No sore throat No difficulty chewing or swallowing RESPIRATORY: No cough, wheezing, SOB CARDIAC: No chest pain, palpitations  Edema. GI: No abdominal pain  No N/V/D or constipation  No heartburn or reflux  GU: No dysuria, frequency or urgency  No change in urine volume or character  No nocturia or change in stream   MUSCULOSKELETAL: No joint pain, swelling or stiffness  No back pain  No muscle ache, pain, weakness  Gait is steady  No recent falls.  NEUROLOGIC: No dizziness, fainting, headache,  No change in mental status (dementia).  PSYCHIATRIC: No feelings of anxiety, depression Sleeps well.  No behavior issue.    Physical Exam Filed Vitals:   01/12/13 1052  BP: 113/64  Pulse: 81  Temp: 97.5 F (36.4 C)  Resp: 20  Weight: 236 lb 11.2 oz (107.366 kg)   Body mass index is 31.24 kg/(m^2).  GENERAL APPEARANCE: No acute distress, appropriately groomed, Overweight body habitus. Alert, pleasant, conversant.  HEAD: Normocephalic, atraumatic  EYES: Conjunctiva/lids clear. Pupils round, reactive.  EARS: External exam WNL. Hearing decreased.  NOSE: No deformity or discharge.  MOUTH/THROAT: Lips w/o lesions. Oral mucosa, tongue moist, w/o lesion. Oropharynx w/o redness or lesions.  NECK: Supple, full ROM. No thyroid tenderness, enlargement or nodule  LYMPHATICS: No head, neck or supraclavicular adenopathy  RESPIRATORY: Breathing is even,  unlabored. Lungs clear in all fields CARDIOVASCULAR: Heart RRR. No murmur or extra heart sounds   VENOUS: Bilateral LE varicosities. No venous stasis skin changes  EDEMA: 1+ Bilateral LE edema, compression hose on.  GASTROINTESTINAL: Abdomen is soft, non-tender, not distended w/ normal bowel sounds.  MUSCULOSKELETAL: Moves  all extremities with full ROM, strength and tone. Back is without kyphosis, scoliosis or spinal process tenderness. Gait is steady,waddling  NEUROLOGIC: Not oriented to time, place. Speech clear,language limited. No tremor.  PSYCHIATRIC: Mood and affect appropriate to situation  ASSESSMENT/PLAN  Edema of both legs Bilateral LE edema and weight increase in the last month. No sign of DVT, pulmonary edema. No skin redness or breakdown. Both legs with significant venous varicosities. Pt also with chronic AF, rate controlled. Compression hose, ultrasound therapy and attempts at leg elevation have been mildly useful. Add low dose diuretic. Check BMP with next INR  Atrial fibrillation Irregular rhythm, rate conrolled. Copntinue present medication including anticoagulation  Long term (current) use of anticoagulants Recent INR below therapeutic range, warfarin dose adjusted. Most recent INR in range- next due 01/26/13  Alzheimer's disease No change reported in cognition or functional status. Patient is adjusting very well to Memory Care environment. Family remains engaged and supportive.    Follow up: Routine or as needed  Braylin Xu T.Catalia Massett, NP-C 01/12/2013

## 2013-01-26 DIAGNOSIS — R609 Edema, unspecified: Secondary | ICD-10-CM | POA: Diagnosis not present

## 2013-01-26 DIAGNOSIS — M2040 Other hammer toe(s) (acquired), unspecified foot: Secondary | ICD-10-CM | POA: Diagnosis not present

## 2013-01-26 DIAGNOSIS — B351 Tinea unguium: Secondary | ICD-10-CM | POA: Diagnosis not present

## 2013-01-26 DIAGNOSIS — Z7901 Long term (current) use of anticoagulants: Secondary | ICD-10-CM | POA: Diagnosis not present

## 2013-02-01 DIAGNOSIS — R079 Chest pain, unspecified: Secondary | ICD-10-CM | POA: Diagnosis not present

## 2013-02-02 ENCOUNTER — Other Ambulatory Visit: Payer: Self-pay | Admitting: Geriatric Medicine

## 2013-02-02 MED ORDER — LORAZEPAM 0.5 MG PO TABS: 0.5000 mg | ORAL_TABLET | Freq: Every day | ORAL | Status: DC

## 2013-02-09 DIAGNOSIS — R609 Edema, unspecified: Secondary | ICD-10-CM | POA: Diagnosis not present

## 2013-02-09 DIAGNOSIS — M6281 Muscle weakness (generalized): Secondary | ICD-10-CM | POA: Diagnosis not present

## 2013-02-09 DIAGNOSIS — R279 Unspecified lack of coordination: Secondary | ICD-10-CM | POA: Diagnosis not present

## 2013-02-10 DIAGNOSIS — M6281 Muscle weakness (generalized): Secondary | ICD-10-CM | POA: Diagnosis not present

## 2013-02-10 DIAGNOSIS — R609 Edema, unspecified: Secondary | ICD-10-CM | POA: Diagnosis not present

## 2013-02-10 DIAGNOSIS — R279 Unspecified lack of coordination: Secondary | ICD-10-CM | POA: Diagnosis not present

## 2013-02-16 DIAGNOSIS — Z7901 Long term (current) use of anticoagulants: Secondary | ICD-10-CM | POA: Diagnosis not present

## 2013-02-23 DIAGNOSIS — H35359 Cystoid macular degeneration, unspecified eye: Secondary | ICD-10-CM | POA: Diagnosis not present

## 2013-02-23 DIAGNOSIS — H35369 Drusen (degenerative) of macula, unspecified eye: Secondary | ICD-10-CM | POA: Diagnosis not present

## 2013-02-23 DIAGNOSIS — H35379 Puckering of macula, unspecified eye: Secondary | ICD-10-CM | POA: Diagnosis not present

## 2013-02-23 DIAGNOSIS — H35049 Retinal micro-aneurysms, unspecified, unspecified eye: Secondary | ICD-10-CM | POA: Diagnosis not present

## 2013-03-07 DIAGNOSIS — R109 Unspecified abdominal pain: Secondary | ICD-10-CM | POA: Diagnosis not present

## 2013-03-07 DIAGNOSIS — R4182 Altered mental status, unspecified: Secondary | ICD-10-CM | POA: Diagnosis not present

## 2013-03-09 ENCOUNTER — Non-Acute Institutional Stay (SKILLED_NURSING_FACILITY): Payer: Medicare Other | Admitting: Geriatric Medicine

## 2013-03-09 ENCOUNTER — Encounter: Payer: Self-pay | Admitting: Geriatric Medicine

## 2013-03-09 DIAGNOSIS — F0280 Dementia in other diseases classified elsewhere without behavioral disturbance: Secondary | ICD-10-CM | POA: Diagnosis not present

## 2013-03-09 NOTE — Progress Notes (Signed)
Patient ID: Bryan [name], male   DOB: 1923/03/04, 79 y.o.   MRN: [id number] [facility] [phone])  Code Status: Full Code  Contact Information   Name Relation Home Work [id number]     Xzavien, Santora Daughter [phone]     [name],[name] Daughter [phone]     Javar, Castellano [phone]        Chief Complaint  Patient presents with  . Difficulty mobility    HPI: This is a 79 y.o. male resident of [name],  Memory Care section.  Evaluation is requested today due to difficulty getting up from chair or bed. Nurse reports patient appears to be in pain.  Urine was collected 8/31, preliminary U/A results negative for sign of infection. Caregiver reports patient has exhibited increasing difficulty managing getting getting in/out of the car.   Review of record shows patient's INR remains in therapeutic range.   Quarterly care planning meeting was held in early August. Patient's spouse and son [name] attended. Patient's weight gain was noted, new order to adjust portion sizes and add healthier snacks. Spouse very pleased with patient's adjustment to Memory Care setting. Social worker reviewed CODE STATUS and encouraged discussion with all family members and consider initiating a DO NOT RESUSCITATE order.    Functional Status: Bathing: Moderate Assist Bed Mobility: Minimal Assist, Toileting / Clothing: Moderate Assist Transfers: Moderate Assist, Ambulation: Supervision   Allergies  Allergen Reactions  . Penicillins Rash  . Bee Venom    Medications Reviewed  DATA REVIEWED  Radiologic Exams  [facility] 02/01/2013 x-ray right ribs: No definite right rib fractures can be identified in the lower right rib areas. Right lung is clear in the mid and lower lung area  Laboratory Studies   [facility], external 11/17/2012: 30 BC 6.4, hemoglobin 14.5, hematocrit 41.1, platelets 151.   INR 1.73    Glucose 100, BUN 20, creatinine 1.05, sodium 138, potassium 4.1. LFTs WNL. Albumin 3.3   Total cholesterol 111, triglycerides 57, HDL 40, LDL 60   TSH 2.8   B12 229  01/05/13 INR 2.23 01/26/2013 INR 2.41  Glucose 86, BUN 19, creatinine 1.15, sodium 139, potassium 4.2 02/16/2013 INR 2.32     Review of Systems  DATA OBTAINED: from patient, nurse, medical record, caregiver GENERAL: Feels well   No fevers, fatigue, change in appetite, Weight has increased SKIN: No itch, rash or open wounds EYES: No eye pain, dryness or itching  No change in vision EARS: No earache, no change in hearing NOSE: No congestion, drainage or bleeding MOUTH/THROAT: No mouth or tooth pain  No sore throat No difficulty chewing or swallowing RESPIRATORY: No cough, wheezing, SOB CARDIAC: No chest pain.  Edema. GI: No abdominal pain  No N/V/D or constipation  No heartburn or reflux   Intermittent incontinence GU: No dysuria, frequency or urgency  No change in urine volume or character    Intermittent incontinence MUSCULOSKELETAL: No joint pain, swelling or stiffness   No muscle ache, pain, weakness  Gait is steady with walker.  No recent falls. Increasing difficulty with transfers NEUROLOGIC: No dizziness, fainting, headache,  Requires more direction (dementia).  PSYCHIATRIC: No feelings of anxiety, depression Sleeps well.  Occ. Verbal outbursts  Physical Exam Filed Vitals:   03/09/13 1033  BP: 116/61  Pulse: 58  Temp: 96.7 F (35.9 C)  Resp: 18  Weight: 241 lb (109.317 kg)   Body mass index is 31.8 kg/(m^2).  GENERAL APPEARANCE: No acute distress, appropriately  groomed, Overweight body habitus. Alert, pleasant, conversant.  HEAD: Normocephalic, atraumatic  EYES: Conjunctiva/lids clear. Pupils round, reactive.  EARS: External exam WNL. Hearing decreased.  NOSE: No deformity or discharge.  MOUTH/THROAT: Lips w/o lesions. Oral mucosa, tongue moist, w/o lesion. Oropharynx w/o redness or lesions.  NECK:  Supple, full ROM. No thyroid tenderness, enlargement or nodule  LYMPHATICS: No head, neck or supraclavicular adenopathy  RESPIRATORY: Breathing is even, unlabored. Lungs clear in all fields CARDIOVASCULAR: Heart RRR. No murmur or extra heart sounds   VENOUS: Bilateral LE varicosities. No venous stasis skin changes  EDEMA: 1+ Rt LE edema, trace LEft LE, compression hose on.  GASTROINTESTINAL: Abdomen is soft, non-tender, not distended w/ normal bowel sounds.  MUSCULOSKELETAL: Moves all extremities with full ROM, strength and tone. Back is without kyphosis, scoliosis or spinal process tenderness.  Requires increased direction to move from sitting to standing, very can follow only one direction at a time. No sign of discomfort with any movement. Once standing, Gait is steady,with walker  NEUROLOGIC: Not oriented to time, place. Speech clear,language limited. No tremor.  PSYCHIATRIC: Mood and affect appropriate to situation  ASSESSMENT/PLAN  Alzheimer's disease Difficulty with mobility does not appear to be due to back pain today; I could not elicit any back pain with movement. Patient is having increasing difficulty with following through with activities that require multiple steps. He is able to follow single step instructions, cannot follow 2 step instructions today. I expect some of this patient's recent verbal outbursts may be related to frustration of not understanding what he needs to do. Will ask OT to evaluate.  Patient will benefit from simplification of his daily routine. Going out in the car for errands, etc, may be beyond his abilities. Discussed with pt's spouse.   Time: 25 minutes, >50% spent counseling, care coordination  Follow up: Routine or as needed  Amman Bartel T.Darrelle Barrell, NP-C 03/09/2013

## 2013-03-09 NOTE — Assessment & Plan Note (Addendum)
Difficulty with mobility does not appear to be due to back pain today; I could not elicit any back pain with movement. Patient is having increasing difficulty with following through with activities that require multiple steps. He is able to follow single step instructions, cannot follow 2 step instructions today. I expect some of this patient's recent verbal outbursts may be related to frustration of not understanding what he needs to do. Will ask OT to evaluate.  Patient will benefit from simplification of his daily routine. Going out in the car for errands, etc, may be beyond his abilities. Discussed with pt's spouse.

## 2013-03-10 ENCOUNTER — Other Ambulatory Visit: Payer: Self-pay | Admitting: Geriatric Medicine

## 2013-03-10 DIAGNOSIS — M545 Low back pain: Secondary | ICD-10-CM | POA: Diagnosis not present

## 2013-03-10 DIAGNOSIS — M479 Spondylosis, unspecified: Secondary | ICD-10-CM | POA: Insufficient documentation

## 2013-03-10 DIAGNOSIS — M546 Pain in thoracic spine: Secondary | ICD-10-CM | POA: Diagnosis not present

## 2013-03-10 MED ORDER — ACETAMINOPHEN 325 MG PO TABS: 650.0000 mg | ORAL_TABLET | Freq: Three times a day (TID) | ORAL | Status: DC

## 2013-03-16 DIAGNOSIS — I4891 Unspecified atrial fibrillation: Secondary | ICD-10-CM | POA: Diagnosis not present

## 2013-03-18 DIAGNOSIS — M6281 Muscle weakness (generalized): Secondary | ICD-10-CM | POA: Diagnosis not present

## 2013-03-18 DIAGNOSIS — R269 Unspecified abnormalities of gait and mobility: Secondary | ICD-10-CM | POA: Diagnosis not present

## 2013-03-18 DIAGNOSIS — R279 Unspecified lack of coordination: Secondary | ICD-10-CM | POA: Diagnosis not present

## 2013-03-30 ENCOUNTER — Non-Acute Institutional Stay (SKILLED_NURSING_FACILITY): Payer: Medicare Other | Admitting: Geriatric Medicine

## 2013-03-30 ENCOUNTER — Encounter: Payer: Self-pay | Admitting: Geriatric Medicine

## 2013-03-30 DIAGNOSIS — L989 Disorder of the skin and subcutaneous tissue, unspecified: Secondary | ICD-10-CM | POA: Insufficient documentation

## 2013-03-30 DIAGNOSIS — I872 Venous insufficiency (chronic) (peripheral): Secondary | ICD-10-CM

## 2013-03-30 DIAGNOSIS — I4891 Unspecified atrial fibrillation: Secondary | ICD-10-CM

## 2013-03-30 DIAGNOSIS — Z7901 Long term (current) use of anticoagulants: Secondary | ICD-10-CM

## 2013-03-30 NOTE — Assessment & Plan Note (Signed)
Patient with small suspicious lesion on left temporal area of his face. Spouse reports patient has history of skin cancer and so would like this area it assessed by dermatology. I do not have any information regarding previous diagnosis or treatment of skin cancer. Will refer patient to [facility]Marland Kitchen

## 2013-03-30 NOTE — Assessment & Plan Note (Signed)
Most recent INR remained in therapeutic range, continue current warfarin dose. Next INR is due October 7.

## 2013-03-30 NOTE — Assessment & Plan Note (Signed)
Heart rate remains irregular, rate is controlled. Continue current medications including anticoagulation

## 2013-03-30 NOTE — Progress Notes (Signed)
Patient ID: Bryan [name], male   DOB: 1923/06/25, 79 y.o.   MRN: [id number] [facility] [phone])  Code Status: Full Code  Contact Information   Name Relation Home Work [place] [id number]     [name], [name] Daughter [phone]     [name],[name] Daughter [phone]     Akil, Ryba [phone]        Chief Complaint  Patient presents with  . Skin lesion  . Leg Swelling  . Atrial Fibrillation  . Anticoagulation    HPI: This is a 79 y.o. male resident of [place], Memory Care section.  Patient's spouse requested evaluation of face lesion.  It has been present 'for some time...". Patient has history of skin cancer, acidosis should be assessed by a dermatologist. Spouse also expresses some concern about pimples on the patient's back.   Diuretic was started last month due to LE edema and weight gain. Pt's has had modest weght loss, LE edema mildly improved. Patient continues to wear compression hose daily.  Review of record shows patient's BP/HR have been stable,  INR remains in therapeutic range.   Quarterly care planning meeting was held in early August. Patient's spouse and son Bryan [name] attended.Spouse very pleased with patient's adjustment to Memory Care setting. Social worker reviewed CODE STATUS and encouraged discussion with all family members and consider initiating a DO NOT RESUSCITATE order. No further discussion since re; code status   Functional Status: Bathing: Moderate Assist Bed Mobility: Minimal Assist, Toileting / Clothing: Moderate Assist Transfers: Moderate Assist, Ambulation: Supervision   Allergies  Allergen Reactions  . Penicillins Rash  . Bee Venom    Medications    Medication List       This list is accurate as of: 03/30/13 10:41 AM.  Always use your most recent med list.               acetaminophen 325 MG tablet  Commonly known as:  TYLENOL  Take 2 tablets (650  mg total) by mouth 3 (three) times daily.     atorvastatin 10 MG tablet  Commonly known as:  LIPITOR  Take 10 mg by mouth daily.     donepezil 10 MG tablet  Commonly known as:  ARICEPT  Take 10 mg by mouth daily.     fluticasone 27.5 MCG/SPRAY nasal spray  Commonly known as:  VERAMYST  Place 2 sprays into the nose daily.     furosemide 20 MG tablet  Commonly known as:  LASIX  Take 1 tablet (20 mg total) by mouth daily.     LORazepam 0.5 MG tablet  Commonly known as:  ATIVAN  Take 1 tablet (0.5 mg total) by mouth daily. After dinner     NAMENDA XR 28 MG Cp24  Generic drug:  Memantine HCl ER  Take 1 capsule by mouth daily.     nitroGLYCERIN 0.4 MG SL tablet  Commonly known as:  NITROSTAT  Place 0.4 mg under the tongue every 5 (five) minutes as needed for chest pain.     omeprazole 20 MG capsule  Commonly known as:  PRILOSEC  Take 20 mg by mouth daily.     tamsulosin 0.4 MG Caps capsule  Commonly known as:  FLOMAX  Take 0.4 mg by mouth daily after supper.     vitamin B-12 1000 MCG tablet  Commonly known as:  CYANOCOBALAMIN  Take 1,000 mcg by mouth daily.     warfarin 4 MG tablet  Commonly known  as:  COUMADIN  Take 4 mg by mouth as directed. 4mg  S,M,Th,Sa     warfarin 5 MG tablet  Commonly known as:  COUMADIN  Take 5 mg by mouth as directed. Give 5mg  Tu,W, Fri or as directed       Past Medical History  Diagnosis Date  . Hyperlipidemia   . Alzheimer's disease 2013  . GERD (gastroesophageal reflux disease)   . BPH (benign prostatic hyperplasia)   . Atrial fibrillation     Long term anti coagulation w/ warfarin fro stroke risk reduction  . 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 Lactose intolerance   . Allergic rhinitis   . Long term (current) use of anticoagulants 11/23/2012    Long-term anticoagulation with warfarin for stroke risk reduction related to AF  . Gait disorder   . Chronic venous insufficiency 01/12/2013   Past Surgical  History  Procedure Laterality Date  . Cataract extraction w/ intraocular lens  implant, bilateral    . Hernia repair Right 1970s  . Retinal detachment surgery Right 2009  . Knee surgery Right 2000   History   Social History Narrative   Married. 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. Patient and spouse have resided at [place] since June 2010. Patient recently moved to the Memory Care section in same community. He quit smoking 1972, drinks an occasional alcoholic drink.   Has a living will and healthcare power of attorney.    DATA REVIEWED  Radiologic Exams  [facility] mobile x-ray 02/01/2013 x-ray right ribs: No definite right rib fractures can be identified in the lower right rib areas. Right lung is clear in the mid and lower lung area  Laboratory Studies   [facility], external 11/17/2012: WBC 6.4, hemoglobin 14.5, hematocrit 41.1, platelets 151.   INR 1.73   Glucose 100, BUN 20, creatinine 1.05, sodium 138, potassium 4.1. LFTs WNL. Albumin 3.3   Total cholesterol 111, triglycerides 57, HDL 40, LDL 60   TSH 2.8   B12 229  01/05/13 INR 2.23 01/26/2013 INR 2.41  Glucose 86, BUN 19, creatinine 1.15, sodium 139, potassium 4.2 02/16/2013 INR 2.32 9/9/201 INR 2.97     Review of Systems  DATA OBTAINED: from patient, nurse, medical record, caregiver GENERAL: Feels well   No fevers, fatigue, change in appetite, Weight has decreased 7.5lb since starting diuretic SKIN: No itch, rash SEE HPI EYES: No eye pain, dryness or itching  No change in vision EARS: No earache, no change in hearing NOSE: No congestion, drainage or bleeding MOUTH/THROAT: No mouth or tooth pain  No sore throat No difficulty chewing or swallowing RESPIRATORY: No cough, wheezing, SOB CARDIAC: No chest pain.  Edema. GI: No abdominal pain  No N/V/D or constipation  No heartburn or reflux   Intermittent incontinence GU: No dysuria, frequency or urgency  No change in urine volume or character     Intermittent incontinence MUSCULOSKELETAL: No joint pain, swelling or stiffness   No muscle ache, pain, weakness  Gait is steady with walker.  No recent falls. Increasing difficulty with transfers NEUROLOGIC: No dizziness, fainting, headache,  Requires more direction (dementia).  PSYCHIATRIC: No feelings of anxiety, depression Sleeps well.  Occ. Verbal outbursts  Physical Exam Filed Vitals:   03/30/13 1032  BP: 118/81  Pulse: 80  Temp: 97.4 F (36.3 C)  Resp: 22  Weight: 233 lb 9.6 oz (105.96 kg)   Body mass index is 30.83 kg/(m^2).  GENERAL APPEARANCE: No acute distress, appropriately groomed, Overweight  body habitus. Alert, pleasant, conversant.  SKIN: At left temporal region there is a small, raised pink lesion, edge is a bit uneven, surface is shiny. There is a second lesion close by that is flatter, dry and scaly. On patient's back I see 2 comedones with mild surrounding swelling. No redness or tenderness. HEAD: Normocephalic, atraumatic  EYES: Conjunctiva/lids clear. Pupils round, reactive.  EARS: External exam WNL. Hearing decreased.  NOSE: No deformity or discharge.  MOUTH/THROAT: Lips w/o lesions. Oral mucosa, tongue moist, w/o lesion. Oropharynx w/o redness or lesions.  NECK: Supple, full ROM. No thyroid tenderness, enlargement or nodule  LYMPHATICS: No head, neck or supraclavicular adenopathy  RESPIRATORY: Breathing is even, unlabored. Lungs clear in all fields CARDIOVASCULAR: Heart RRR. No murmur or extra heart sounds   VENOUS: Bilateral LE varicosities. No venous stasis skin changes  EDEMA: 1+ Rt LE edema, trace LEft LE, compression hose on.  GASTROINTESTINAL: Abdomen is soft, non-tender, not distended w/ normal bowel sounds.  MUSCULOSKELETAL: Moves all extremities with full ROM, strength and tone. Back is without kyphosis, scoliosis or spinal process tenderness.  Requires significant direction to move from sitting to standing, ( can follow only one direction at a time)  No sign of discomfort with any movement. Once standing, Gait is steady,with walker  NEUROLOGIC: Not oriented to time, place. Speech clear,language limited. No tremor.  PSYCHIATRIC: Mood and affect appropriate to situation  ASSESSMENT/PLAN  Atrial fibrillation Heart rate remains irregular, rate is controlled. Continue current medications including anticoagulation  Chronic venous insufficiency Mild bilateral lower extremity edema persists despite low-dose diuretic and compression hose. No leg discomfort or skin integrity issues. Continue current medications and compression  Long term (current) use of anticoagulants Most recent INR remained in therapeutic range, continue current warfarin dose. Next INR is due October 7.  Skin lesion of face Patient with small suspicious lesion on left temporal area of his face. Spouse reports patient has history of skin cancer and so would like this area it assessed by dermatology. I do not have any information regarding previous diagnosis or treatment of skin cancer. Will refer patient to [place]..    Follow up: Routine or as needed  Jazmyne Beauchesne T.Karon Heckendorn, NP-C 03/30/2013

## 2013-03-30 NOTE — Assessment & Plan Note (Signed)
Mild bilateral lower extremity edema persists despite low-dose diuretic and compression hose. No leg discomfort or skin integrity issues. Continue current medications and compression

## 2013-04-06 DIAGNOSIS — L723 Sebaceous cyst: Secondary | ICD-10-CM | POA: Diagnosis not present

## 2013-04-06 DIAGNOSIS — D485 Neoplasm of uncertain behavior of skin: Secondary | ICD-10-CM | POA: Diagnosis not present

## 2013-04-06 DIAGNOSIS — C44319 Basal cell carcinoma of skin of other parts of face: Secondary | ICD-10-CM | POA: Diagnosis not present

## 2013-04-13 DIAGNOSIS — Z7901 Long term (current) use of anticoagulants: Secondary | ICD-10-CM | POA: Diagnosis not present

## 2013-04-27 DIAGNOSIS — Z7901 Long term (current) use of anticoagulants: Secondary | ICD-10-CM | POA: Diagnosis not present

## 2013-05-15 DIAGNOSIS — M25579 Pain in unspecified ankle and joints of unspecified foot: Secondary | ICD-10-CM | POA: Diagnosis not present

## 2013-05-16 DIAGNOSIS — Z7901 Long term (current) use of anticoagulants: Secondary | ICD-10-CM | POA: Diagnosis not present

## 2013-05-16 DIAGNOSIS — M7989 Other specified soft tissue disorders: Secondary | ICD-10-CM | POA: Diagnosis not present

## 2013-05-16 DIAGNOSIS — M79609 Pain in unspecified limb: Secondary | ICD-10-CM | POA: Diagnosis not present

## 2013-05-16 DIAGNOSIS — L539 Erythematous condition, unspecified: Secondary | ICD-10-CM | POA: Diagnosis not present

## 2013-05-17 ENCOUNTER — Encounter: Payer: Self-pay | Admitting: Geriatric Medicine

## 2013-05-17 ENCOUNTER — Non-Acute Institutional Stay (SKILLED_NURSING_FACILITY): Payer: Medicare Other | Admitting: Geriatric Medicine

## 2013-05-17 DIAGNOSIS — L0291 Cutaneous abscess, unspecified: Secondary | ICD-10-CM

## 2013-05-17 DIAGNOSIS — R609 Edema, unspecified: Secondary | ICD-10-CM

## 2013-05-17 DIAGNOSIS — L0390 Cellulitis, unspecified: Secondary | ICD-10-CM

## 2013-05-17 DIAGNOSIS — R600 Localized edema: Secondary | ICD-10-CM

## 2013-05-17 MED ORDER — FUROSEMIDE 20 MG PO TABS: 20.0000 mg | ORAL_TABLET | Freq: Two times a day (BID) | ORAL | Status: DC

## 2013-05-17 NOTE — Progress Notes (Signed)
Patient ID: Bryan [name], male   DOB: July 03, 1923, 79 y.o.   MRN: [id number] [facility] [phone])  Code Status: Full Code      Contact Information   Name Relation Home Work [place] [id number]     Vidit, Beaubrun Daughter [phone]     [name],[name] Daughter [phone]     Jammy, Lagrassa [phone]        Chief Complaint  Patient presents with  . Red, swollen foot    HPI: This is a 79 y.o. male resident of [place], Memory Care section.  Evaluation requested today due to red, pain, swollen rt. Foot.   On November 8 record shows patient complained of painful right foot when he was walking, nurse noted redness and swelling to the right foot and ankle and leg. Patient compression hose was removed and the patient was placed in a recliner for leg elevation. Pain and swelling continued, the next day the on-call provider was contacted who ordered a venous Doppler study  of the right lower leg, x-ray of the right foot, stat INR, and uric acid.  There is no evidence of blood clot, fracture, osteomyelitis or gout. Doxycycline was started for presumed cellulitis.  Functional Status: Bathing: Moderate Assist Bed Mobility: Minimal Assist, Toileting / Clothing: Moderate Assist Transfers: Moderate Assist, Ambulation: Supervision   Allergies  Allergen Reactions  . Penicillins Rash  . Bee Venom    Medications reviewed   DATA REVIEWED  Radiologic Exams  [facility] x-ray 02/01/2013 x-ray right ribs: No definite right rib fractures can be identified in the lower right rib areas. Right lung is clear in the mid and lower lung area  05/15/2013 three-view x-ray of the right foot: No fracture or dislocation seen. No radiographic evidence for osteomyelitis   [facility] ultrasound 05/16/2013 right lower extremity venous Doppler study negative for DVT  Laboratory Studies   [facility], external 11/17/2012:  WBC 6.4, hemoglobin 14.5, hematocrit 41.1, platelets 151.   INR 1.73   Glucose 100, BUN 20, creatinine 1.05, sodium 138, potassium 4.1. LFTs WNL. Albumin 3.3   Total cholesterol 111, triglycerides 57, HDL 40, LDL 60   TSH 2.8   B12 229  01/05/13 INR 2.23 01/26/2013 INR 2.41  Glucose 86, BUN 19, creatinine 1.15, sodium 139, potassium 4.2 02/16/2013 INR 2.32 9/9/201 INR 2.97 04/13/2013 INR 3.1,   10/29 INR 2.97  05/16/2013 INR 2.95  Uric acid 5.5     Review of Systems  DATA OBTAINED: from patient, nurse, medical record, caregiver GENERAL: Feels well   No fevers, fatigue, change in appetite, Weight is unchanged the last several weeks  SKIN: No itch, rash SEE HPI RESPIRATORY: No cough, wheezing, SOB CARDIAC: No chest pain.  Edema. GI: No abdominal pain  No N/V/D or constipation  No heartburn or reflux   Intermittent incontinence GU: No dysuria, frequency or urgency  No change in urine volume or character    Intermittent incontinence NEUROLOGIC: No dizziness, fainting, headache,  Requires more direction (dementia).  PSYCHIATRIC: No feelings of anxiety, depression Sleeps well.  Occ. Verbal outbursts  Physical Exam Filed Vitals:   05/17/13 1119  Temp: 97 F (36.1 C)  Weight: 228 lb (103.42 kg)   Body mass index is 30.09 kg/(m^2).  GENERAL APPEARANCE: No acute distress, appropriately groomed, Overweight body habitus. Alert, pleasant, conversant.  EYES: Conjunctiva/lids clear. EARS:  Hearing decreased.  NOSE: No deformity or discharge.  RESPIRATORY: Breathing is even, unlabored. Lungs clear in all fields CARDIOVASCULAR:  Heart RRR. No murmur or extra heart sounds   VENOUS: Bilateral LE varicosities. No venous stasis skin changes  EDEMA: 2-3+ Rt LE edema, forefoot is very red, warm, tender. Several small scabbed areas on forefoot. Ankle/lower leg red, edematous. Left LE 2+edema, compression hose on.  NEUROLOGIC: Not oriented to time, place. Speech clear,language limited. No tremor.   PSYCHIATRIC: Mood and affect appropriate to situation  ASSESSMENT/PLAN  Cellulitis Rt. Forefoot with evidence of cellulitis, likely due to superficial wounds/ edema.  Keep legs elevated, increase diuretic. Add compression with ACE when cellulitis improves.   Edema of both legs Patient  had good initial response to low dose diuretic, increase to b.i.d., keep legs elevated today.     Follow up: Routine or as needed  Jawara Latorre T.Lynnex Fulp, NP-C 05/17/2013

## 2013-05-17 NOTE — Assessment & Plan Note (Signed)
Patient  had good initial response to low dose diuretic, increase to b.i.d., keep legs elevated today.

## 2013-05-17 NOTE — Assessment & Plan Note (Signed)
Rt. Forefoot with evidence of cellulitis, likely due to superficial wounds/ edema.  Keep legs elevated, increase diuretic. Add compression with ACE when cellulitis improves.

## 2013-05-20 ENCOUNTER — Other Ambulatory Visit: Payer: Self-pay | Admitting: Geriatric Medicine

## 2013-05-20 DIAGNOSIS — Z7901 Long term (current) use of anticoagulants: Secondary | ICD-10-CM | POA: Diagnosis not present

## 2013-05-20 MED ORDER — SPIRONOLACTONE 25 MG PO TABS: 25.0000 mg | ORAL_TABLET | Freq: Every day | ORAL | Status: DC

## 2013-05-27 DIAGNOSIS — Z79899 Other long term (current) drug therapy: Secondary | ICD-10-CM | POA: Diagnosis not present

## 2013-05-27 DIAGNOSIS — Z7901 Long term (current) use of anticoagulants: Secondary | ICD-10-CM | POA: Diagnosis not present

## 2013-06-07 DIAGNOSIS — C44319 Basal cell carcinoma of skin of other parts of face: Secondary | ICD-10-CM | POA: Diagnosis not present

## 2013-06-17 DIAGNOSIS — Z7901 Long term (current) use of anticoagulants: Secondary | ICD-10-CM | POA: Diagnosis not present

## 2013-06-22 DIAGNOSIS — Z7901 Long term (current) use of anticoagulants: Secondary | ICD-10-CM | POA: Diagnosis not present

## 2013-06-22 LAB — POCT INR: INR: 1.8 — AB (ref 0.9–1.1)

## 2013-06-29 DIAGNOSIS — Z7901 Long term (current) use of anticoagulants: Secondary | ICD-10-CM | POA: Diagnosis not present

## 2013-06-29 LAB — POCT INR: INR: 2.2 — AB (ref 0.9–1.1)

## 2013-07-13 DIAGNOSIS — C44319 Basal cell carcinoma of skin of other parts of face: Secondary | ICD-10-CM | POA: Diagnosis not present

## 2013-07-13 DIAGNOSIS — I4891 Unspecified atrial fibrillation: Secondary | ICD-10-CM | POA: Diagnosis not present

## 2013-07-13 LAB — POCT INR: INR: 2.8 — AB (ref 0.9–1.1)

## 2013-07-20 ENCOUNTER — Encounter: Payer: Self-pay | Admitting: Geriatric Medicine

## 2013-07-20 ENCOUNTER — Non-Acute Institutional Stay (SKILLED_NURSING_FACILITY): Payer: Medicare Other | Admitting: Geriatric Medicine

## 2013-07-20 DIAGNOSIS — L989 Disorder of the skin and subcutaneous tissue, unspecified: Secondary | ICD-10-CM

## 2013-07-20 DIAGNOSIS — I4891 Unspecified atrial fibrillation: Secondary | ICD-10-CM

## 2013-07-20 DIAGNOSIS — Z7901 Long term (current) use of anticoagulants: Secondary | ICD-10-CM

## 2013-07-20 NOTE — Progress Notes (Signed)
Patient ID: Bryan [name], male   DOB: 1922-11-16, 80 y.o.   MRN: [id number]  [facility] [phone])  Code Status: Full Code  Contact Information   Name Relation Home Work [place] [id number]     Bryan [name] Daughter [phone]     Bryan [name] Daughter [phone]     Bryan [name] [phone]        Chief Complaint  Patient presents with  . Leg Swelling  . Coagulation management    HPI: This is a 80 y.o. male resident of [facility] [place], Memory Care section.  Evaluation requested today in followup of his lower extremity edema as well as coagulation management prior to planned dermatologic surgery.  Last visit: Cellulitis Rt. Forefoot with evidence of cellulitis, likely due to superficial wounds/ edema.  Keep legs elevated, increase diuretic. Add compression with ACE when cellulitis improves.   Edema of both legs Patient  had good initial response to low dose diuretic, increase to b.i.d., keep legs elevated today.   Since last visit, patient's cellulitis has resolved completely. Edema has improved as well. He has had significant weight loss over the last 2 months attributed to a combination of diuretic and possibly decreased p.o. Intake.  Patient was evaluated at [facility] regarding a recurrent lesion on his right temple area. Biopsy was taken and showed recurrent skin cancer. Excisional surgery is planned for January 22, there is request for coagulation management in the perioperative period   Functional Status: Bathing: Moderate Assist Bed Mobility: Minimal Assist, Toileting / Clothing: Moderate Assist Transfers: Moderate Assist, Ambulation: Supervision   Allergies  Allergen Reactions  . Penicillins Rash  . Bee Venom    MEDICATION-  Reviewed   DATA REVIEWED  Radiologic Exams  [facility] x-ray 02/01/2013 x-ray right ribs: No definite right rib fractures  can be identified in the lower right rib areas. Right lung is clear in the mid and lower lung area  05/15/2013 three-view x-ray of the right foot: No fracture or dislocation seen. No radiographic evidence for osteomyelitis   [facility] ultrasound 05/16/2013 right lower extremity venous Doppler study negative for DVT  Laboratory Studies   [facility], external 11/17/2012: WBC 6.4, hemoglobin 14.5, hematocrit 41.1, platelets 151.   INR 1.73   Glucose 100, BUN 20, creatinine 1.05, sodium 138, potassium 4.1. LFTs WNL. Albumin 3.3   Total cholesterol 111, triglycerides 57, HDL 40, LDL 60   TSH 2.8   B12 229  01/26/2013 INR 2.41  Glucose 86, BUN 19, creatinine 1.15, sodium 139, potassium 4.2  05/16/2013 Uric acid 5.5     Lab Results  Component Value Date   INR 2.8* 07/13/2013   INR 2.2* 06/29/2013   INR 1.8* 06/22/2013    REVIEW OF SYSTEMS  DATA OBTAINED: from patient, nurse, medical record, caregiver GENERAL: Feels well   No fevers, fatigue, change in appetite, Weight change last several weeks (down 7lbs) SKIN: No itch, rash   RESPIRATORY: No cough, wheezing, SOB CARDIAC: No chest pain.  Edema. GI: No abdominal pain  No N/V/D or constipation  No heartburn or reflux   Intermittent incontinence GU: No dysuria, frequency or urgency  No change in urine volume or character    Intermittent incontinence NEUROLOGIC: No dizziness, fainting, headache,  Requires more direction (dementia).  PSYCHIATRIC: No feelings of anxiety, depression Sleeps well.  Occ. Verbal outbursts  PHYSICAL EXAM   Filed Vitals:   07/20/13 1334  BP: 151/83  Pulse: 59  Temp: 96.1 F (35.6 C)  Resp: 18  Weight: 217 lb 8 oz (98.657 kg)   Body mass index is 28.7 kg/(m^2).  GENERAL APPEARANCE: No acute distress, appropriately groomed, Overweight body habitus. Alert, pleasant, conversant.  SKIN:  Small lesion left parietal scalp, clean dry EYES: Conjunctiva/lids clear. EARS:  Hearing decreased.  NOSE: No  deformity or discharge.  RESPIRATORY: Breathing is even, unlabored. Lungs clear in all fields CARDIOVASCULAR: Heart RRR. No murmur or extra heart sounds   VENOUS: Bilateral LE varicosities. No venous stasis skin changes   EDEMA: 1+ bilateral lower extremity edema  NEUROLOGIC: Not oriented to time, place. Speech clear,language limited. No tremor.  PSYCHIATRIC: Mood and affect appropriate to situation  ASSESSMENT/PLAN  Atrial fibrillation Heart in regular rhythm today, remains well controlled. Continue current medications including anticoagulation  Skin lesion of face Recurrent lesion patient's left parietal scalp area, has been biopsied and returned positive for recurrent skin cancer. Patient is scheduled for surgical excision on 07/29/2013  Long term (current) use of anticoagulants Most recent recent INR in therapeutic range. In anticipation of surgical procedure, will stop warfarin on January 17. Start Lovenox bridge on January 21 for 5 doses (stop Lovenox day prior to surgery). Anticipate restarting warfarin for anticoagulation postop day 1.    Follow up: Routine or as needed  Bryan [name] BryanAzalya Galyon, NP-C 07/20/2013

## 2013-07-20 NOTE — Assessment & Plan Note (Signed)
Most recent recent INR in therapeutic range. In anticipation of surgical procedure, will stop warfarin on January 17. Start Lovenox bridge on January 21 for 5 doses (stop Lovenox day prior to surgery). Anticipate restarting warfarin for anticoagulation postop day 1.

## 2013-07-20 NOTE — Assessment & Plan Note (Signed)
Heart in regular rhythm today, remains well controlled. Continue current medications including anticoagulation

## 2013-07-20 NOTE — Assessment & Plan Note (Addendum)
Recurrent lesion patient's left parietal scalp area, has been biopsied and returned positive for recurrent skin cancer. Patient is scheduled for surgical excision on 07/29/2013

## 2013-07-23 ENCOUNTER — Encounter (HOSPITAL_BASED_OUTPATIENT_CLINIC_OR_DEPARTMENT_OTHER): Payer: Self-pay | Admitting: *Deleted

## 2013-07-23 NOTE — Progress Notes (Signed)
Talked with wife-she will be here-[facility] to send info and ger pt ptt inr,bmet-ekg To send fl2,med list Faxed all preop instructions Asked for HCPOA on file for wife to sign permit

## 2013-07-28 ENCOUNTER — Encounter (HOSPITAL_BASED_OUTPATIENT_CLINIC_OR_DEPARTMENT_OTHER): Payer: Self-pay | Admitting: *Deleted

## 2013-07-28 DIAGNOSIS — Z7901 Long term (current) use of anticoagulants: Secondary | ICD-10-CM | POA: Diagnosis not present

## 2013-07-28 DIAGNOSIS — I4891 Unspecified atrial fibrillation: Secondary | ICD-10-CM | POA: Diagnosis not present

## 2013-07-28 DIAGNOSIS — I10 Essential (primary) hypertension: Secondary | ICD-10-CM | POA: Diagnosis not present

## 2013-07-29 ENCOUNTER — Ambulatory Visit (HOSPITAL_BASED_OUTPATIENT_CLINIC_OR_DEPARTMENT_OTHER): Admission: RE | Admit: 2013-07-29 | Payer: Medicare Other | Source: Ambulatory Visit | Admitting: Plastic Surgery

## 2013-07-29 SURGERY — EXCISION MASS
Anesthesia: General | Site: Head | Laterality: Left

## 2013-07-29 MED ORDER — BUPIVACAINE-EPINEPHRINE PF 0.25-1:200000 % IJ SOLN
INTRAMUSCULAR | Status: AC
  Filled 2013-07-29: qty 30

## 2013-07-29 MED ORDER — FENTANYL CITRATE 0.05 MG/ML IJ SOLN
INTRAMUSCULAR | Status: AC
  Filled 2013-07-29: qty 4

## 2013-07-29 MED ORDER — LIDOCAINE-EPINEPHRINE 1 %-1:100000 IJ SOLN
INTRAMUSCULAR | Status: AC
  Filled 2013-07-29: qty 1

## 2013-07-29 MED ORDER — MIDAZOLAM HCL 2 MG/2ML IJ SOLN
INTRAMUSCULAR | Status: AC
  Filled 2013-07-29: qty 2

## 2013-07-29 NOTE — Progress Notes (Signed)
Talked to nurse [facility]-they were so sorry an aid gave him breakfast-he has demencia-she will have him here [phone]-wife will come to sign permit

## 2013-07-29 NOTE — H&P (Signed)
  Subjective:   Patient ID: Bryan [name] is a 80 y.o. male.   HPI  The patient is an 80 yrs old wm with nodular type BCC on shave biopsy of the left forehead. He is on coumadin, which has been held. Plan was for excision and closure lesion with Dr. Migdalia Dk but patient ate breakfast morning of original surgery date. Plan for surgery with myself so that he may resume his anticoagulation. Both the caregiver and wife requested this to be done in the OR. He has some dementia, hyperlipidemia, hypertension, stroke history and heart disease.   The following portions of the patient's history were reviewed and updated as appropriate: allergies, current medications, past family history, past medical history, past social history, past surgical history and problem list.    Objective:   Physical Exam  Constitutional: He appears well-developed and well-nourished.  HENT: left temple/forehead healed biopsy site Head: Normocephalic and atraumatic. Left temple biopsy site healed Eyes: Conjunctivae and EOM are normal. Pupils are equal, round, and reactive to light.  Cardiovascular: Normal rate.  Pulmonary/Chest: Effort normal.   Assessment:    1.  Basal cell carcinoma of skin of face    Plan:   Plan excision of the area with primary closure.

## 2013-07-30 ENCOUNTER — Encounter (HOSPITAL_BASED_OUTPATIENT_CLINIC_OR_DEPARTMENT_OTHER)
Admission: RE | Disposition: A | Discharge status: Other Acute Inpatient Hospital | Payer: Self-pay | Source: Ambulatory Visit | Attending: Plastic Surgery

## 2013-07-30 ENCOUNTER — Ambulatory Visit (HOSPITAL_BASED_OUTPATIENT_CLINIC_OR_DEPARTMENT_OTHER): Payer: Medicare Other | Admitting: *Deleted

## 2013-07-30 ENCOUNTER — Encounter (HOSPITAL_BASED_OUTPATIENT_CLINIC_OR_DEPARTMENT_OTHER): Payer: Medicare Other | Admitting: *Deleted

## 2013-07-30 ENCOUNTER — Ambulatory Visit (HOSPITAL_BASED_OUTPATIENT_CLINIC_OR_DEPARTMENT_OTHER)
Admission: RE | Admit: 2013-07-30 | Discharge: 2013-07-30 | Payer: Medicare Other | Source: Ambulatory Visit | Attending: Plastic Surgery | Admitting: Plastic Surgery

## 2013-07-30 ENCOUNTER — Encounter (HOSPITAL_BASED_OUTPATIENT_CLINIC_OR_DEPARTMENT_OTHER): Payer: Self-pay | Admitting: *Deleted

## 2013-07-30 DIAGNOSIS — Z8673 Personal history of transient ischemic attack (TIA), and cerebral infarction without residual deficits: Secondary | ICD-10-CM | POA: Diagnosis not present

## 2013-07-30 DIAGNOSIS — I10 Essential (primary) hypertension: Secondary | ICD-10-CM | POA: Insufficient documentation

## 2013-07-30 DIAGNOSIS — C44319 Basal cell carcinoma of skin of other parts of face: Secondary | ICD-10-CM

## 2013-07-30 DIAGNOSIS — E785 Hyperlipidemia, unspecified: Secondary | ICD-10-CM | POA: Insufficient documentation

## 2013-07-30 DIAGNOSIS — I519 Heart disease, unspecified: Secondary | ICD-10-CM | POA: Diagnosis not present

## 2013-07-30 DIAGNOSIS — F0390 Unspecified dementia without behavioral disturbance: Secondary | ICD-10-CM | POA: Insufficient documentation

## 2013-07-30 HISTORY — PX: MASS EXCISION: SHX2000

## 2013-07-30 SURGERY — EXCISION MASS
Anesthesia: General | Site: Head | Laterality: Left

## 2013-07-30 SURGERY — Surgical Case
Anesthesia: *Unknown

## 2013-07-30 MED ORDER — LIDOCAINE-EPINEPHRINE 1 %-1:100000 IJ SOLN
INTRAMUSCULAR | Status: DC | PRN
  Administered 2013-07-30: 4 mL

## 2013-07-30 MED ORDER — ACETAMINOPHEN-CODEINE #3 300-30 MG PO TABS: 1.0000 | ORAL_TABLET | ORAL | Status: DC | PRN

## 2013-07-30 MED ORDER — CLINDAMYCIN PHOSPHATE 600 MG/50ML IV SOLN
INTRAVENOUS | Status: AC
  Filled 2013-07-30: qty 50

## 2013-07-30 MED ORDER — BUPIVACAINE-EPINEPHRINE PF 0.25-1:200000 % IJ SOLN
INTRAMUSCULAR | Status: AC
  Filled 2013-07-30: qty 30

## 2013-07-30 MED ORDER — BACITRACIN ZINC 500 UNIT/GM EX OINT
TOPICAL_OINTMENT | CUTANEOUS | Status: AC
  Filled 2013-07-30: qty 0.9

## 2013-07-30 MED ORDER — LACTATED RINGERS IV SOLN
INTRAVENOUS | Status: DC
  Administered 2013-07-30: 10:00:00 via INTRAVENOUS

## 2013-07-30 MED ORDER — MIDAZOLAM HCL 2 MG/2ML IJ SOLN: 1.0000 mg | INTRAMUSCULAR | Status: DC | PRN

## 2013-07-30 MED ORDER — CLINDAMYCIN PHOSPHATE 600 MG/50ML IV SOLN
600.0000 mg | Freq: Once | INTRAVENOUS | Status: AC
  Administered 2013-07-30: 600 mg via INTRAVENOUS

## 2013-07-30 MED ORDER — FENTANYL CITRATE 0.05 MG/ML IJ SOLN
INTRAMUSCULAR | Status: AC
  Filled 2013-07-30: qty 4

## 2013-07-30 MED ORDER — ONDANSETRON HCL 4 MG/2ML IJ SOLN
INTRAMUSCULAR | Status: DC | PRN
  Administered 2013-07-30: 4 mg via INTRAVENOUS

## 2013-07-30 MED ORDER — FENTANYL CITRATE 0.05 MG/ML IJ SOLN: 50.0000 ug | INTRAMUSCULAR | Status: DC | PRN

## 2013-07-30 MED ORDER — LIDOCAINE-EPINEPHRINE 1 %-1:100000 IJ SOLN
INTRAMUSCULAR | Status: AC
  Filled 2013-07-30: qty 1

## 2013-07-30 MED ORDER — FENTANYL CITRATE 0.05 MG/ML IJ SOLN
INTRAMUSCULAR | Status: DC | PRN
  Administered 2013-07-30: 50 ug via INTRAVENOUS

## 2013-07-30 MED ORDER — PROPOFOL INFUSION 10 MG/ML OPTIME
INTRAVENOUS | Status: DC | PRN
  Administered 2013-07-30: 25 ug/kg/min via INTRAVENOUS

## 2013-07-30 SURGICAL SUPPLY — 73 items
ADH SKN CLS APL DERMABOND .7 (GAUZE/BANDAGES/DRESSINGS)
APL SKNCLS STERI-STRIP NONHPOA (GAUZE/BANDAGES/DRESSINGS)
BENZOIN TINCTURE PRP APPL 2/3 (GAUZE/BANDAGES/DRESSINGS) IMPLANT
BLADE SURG 15 STRL LF DISP TIS (BLADE) ×1 IMPLANT
BLADE SURG 15 STRL SS (BLADE) ×3
BLADE SURG ROTATE 9660 (MISCELLANEOUS) IMPLANT
BNDG CMPR MD 5X2 ELC HKLP STRL (GAUZE/BANDAGES/DRESSINGS)
BNDG CONFORM 2 STRL LF (GAUZE/BANDAGES/DRESSINGS) IMPLANT
BNDG ELASTIC 2 VLCR STRL LF (GAUZE/BANDAGES/DRESSINGS) IMPLANT
CANISTER SUCT 1200ML W/VALVE (MISCELLANEOUS) IMPLANT
CHLORAPREP W/TINT 26ML (MISCELLANEOUS) ×1 IMPLANT
CLEANER CAUTERY TIP 5X5 PAD (MISCELLANEOUS) IMPLANT
CLOSURE WOUND 1/2 X4 (GAUZE/BANDAGES/DRESSINGS) ×1
CORDS BIPOLAR (ELECTRODE) IMPLANT
COVER MAYO STAND STRL (DRAPES) ×3 IMPLANT
COVER TABLE BACK 60X90 (DRAPES) ×3 IMPLANT
DERMABOND ADVANCED (GAUZE/BANDAGES/DRESSINGS)
DERMABOND ADVANCED .7 DNX12 (GAUZE/BANDAGES/DRESSINGS) IMPLANT
DRAIN JP 10F RND SILICONE (MISCELLANEOUS) IMPLANT
DRAPE PED LAPAROTOMY (DRAPES) IMPLANT
DRAPE U-SHAPE 76X120 STRL (DRAPES) ×3 IMPLANT
DRSG TELFA 3X8 NADH (GAUZE/BANDAGES/DRESSINGS) ×3 IMPLANT
ELECT COATED BLADE 2.86 ST (ELECTRODE) ×2 IMPLANT
ELECT NDL BLADE 2-5/6 (NEEDLE) ×1 IMPLANT
ELECT NEEDLE BLADE 2-5/6 (NEEDLE) ×3 IMPLANT
ELECT REM PT RETURN 9FT ADLT (ELECTROSURGICAL) ×3
ELECT REM PT RETURN 9FT PED (ELECTROSURGICAL)
ELECTRODE REM PT RETRN 9FT PED (ELECTROSURGICAL) IMPLANT
ELECTRODE REM PT RTRN 9FT ADLT (ELECTROSURGICAL) IMPLANT
EVACUATOR SILICONE 100CC (DRAIN) IMPLANT
GAUZE XEROFORM 1X8 LF (GAUZE/BANDAGES/DRESSINGS) IMPLANT
GLOVE BIO SURGEON STRL SZ 6 (GLOVE) ×6 IMPLANT
GLOVE BIO SURGEON STRL SZ 6.5 (GLOVE) ×1 IMPLANT
GLOVE BIO SURGEONS STRL SZ 6.5 (GLOVE)
GLOVE ECLIPSE 7.0 STRL STRAW (GLOVE) ×2 IMPLANT
GOWN STRL REUS W/ TWL LRG LVL3 (GOWN DISPOSABLE) ×2 IMPLANT
GOWN STRL REUS W/TWL LRG LVL3 (GOWN DISPOSABLE) ×6
NDL HYPO 25X1 1.5 SAFETY (NEEDLE) IMPLANT
NEEDLE 27GAX1X1/2 (NEEDLE) ×3 IMPLANT
NEEDLE HYPO 25X1 1.5 SAFETY (NEEDLE) IMPLANT
NS IRRIG 1000ML POUR BTL (IV SOLUTION) IMPLANT
PACK BASIN DAY SURGERY FS (CUSTOM PROCEDURE TRAY) ×3 IMPLANT
PAD CLEANER CAUTERY TIP 5X5 (MISCELLANEOUS)
PAD DRESSING TELFA 3X8 NADH (GAUZE/BANDAGES/DRESSINGS) IMPLANT
PENCIL BUTTON HOLSTER BLD 10FT (ELECTRODE) ×2 IMPLANT
RUBBERBAND STERILE (MISCELLANEOUS) IMPLANT
SHEET MEDIUM DRAPE 40X70 STRL (DRAPES) IMPLANT
SPONGE GAUZE 2X2 8PLY STER LF (GAUZE/BANDAGES/DRESSINGS) ×1
SPONGE GAUZE 2X2 8PLY STRL LF (GAUZE/BANDAGES/DRESSINGS) ×1 IMPLANT
SPONGE GAUZE 4X4 12PLY STER LF (GAUZE/BANDAGES/DRESSINGS) IMPLANT
SPONGE LAP 18X18 X RAY DECT (DISPOSABLE) IMPLANT
STRIP CLOSURE SKIN 1/2X4 (GAUZE/BANDAGES/DRESSINGS) ×1 IMPLANT
SUCTION FRAZIER TIP 10 FR DISP (SUCTIONS) IMPLANT
SUT CHROMIC 4 0 P 3 18 (SUTURE) IMPLANT
SUT ETHILON 4 0 PS 2 18 (SUTURE) IMPLANT
SUT ETHILON 5 0 P 3 18 (SUTURE)
SUT MNCRL 6-0 UNDY P1 1X18 (SUTURE) ×1 IMPLANT
SUT MNCRL AB 4-0 PS2 18 (SUTURE) IMPLANT
SUT MON AB 5-0 P3 18 (SUTURE) IMPLANT
SUT MONOCRYL 6-0 P1 1X18 (SUTURE)
SUT NYLON ETHILON 5-0 P-3 1X18 (SUTURE) IMPLANT
SUT PLAIN 5 0 P 3 18 (SUTURE) IMPLANT
SUT PROLENE 5 0 P 3 (SUTURE) ×2 IMPLANT
SUT SILK 2 0 FS (SUTURE) IMPLANT
SUT VIC AB 5-0 P-3 18X BRD (SUTURE) IMPLANT
SUT VIC AB 5-0 P3 18 (SUTURE) ×3
SUT VICRYL 4-0 PS2 18IN ABS (SUTURE) ×1 IMPLANT
SYR BULB 3OZ (MISCELLANEOUS) IMPLANT
SYR CONTROL 10ML LL (SYRINGE) ×3 IMPLANT
TOWEL OR 17X24 6PK STRL BLUE (TOWEL DISPOSABLE) ×3 IMPLANT
TRAY DSU PREP LF (CUSTOM PROCEDURE TRAY) ×2 IMPLANT
TUBE CONNECTING 20'X1/4 (TUBING)
TUBE CONNECTING 20X1/4 (TUBING) IMPLANT

## 2013-07-30 NOTE — Discharge Instructions (Signed)

## 2013-07-30 NOTE — Anesthesia Postprocedure Evaluation (Signed)
Anesthesia Post Note  Patient: Bryan [name]  Procedure(s) Performed: Procedure(s) (LRB): EXCISION OF BASAL CELL CARCINOMA  LEFT FOREHEAD  (Left)  Anesthesia type: MAC  Patient location: PACU  Post pain: Pain level controlled and Adequate analgesia  Post assessment: Post-op Vital signs reviewed, Patient's Cardiovascular Status Stable and Respiratory Function Stable  Last Vitals:  Filed Vitals:   07/30/13 1200  BP: 144/85  Pulse: 69  Temp: 36.7 C  Resp: 16    Post vital signs: Reviewed and stable  Level of consciousness: awake, alert  and oriented  Complications: No apparent anesthesia complications

## 2013-07-30 NOTE — Op Note (Signed)
Operative Note   DATE OF OPERATION: 07/30/13  LOCATION: [place]  SURGICAL DIVISION: Plastic Surgery  PREOPERATIVE DIAGNOSES:  Basal cell carcinoma left temple  POSTOPERATIVE DIAGNOSES:  same  PROCEDURE:  1. Excision malignant lesion left temple 2 cm 2. Layered closure left temple 4 cm  SURGEON: Irene Limbo MD MBA  ASSISTANT: none  ANESTHESIA:  General.   EBL: minimal  COMPLICATIONS: None.   INDICATIONS FOR PROCEDURE:  The patient, Bryan [name], is a 80 y.o. male born on 1922-08-23, is here for treatment of biopsy proved nodular basal cell carcinoma. This is his first skin cancer   FINDINGS: Adjacent scarring to area of biopsy.  DESCRIPTION OF PROCEDURE:  The patient's operative site was marked with the patient's wife in the preoperative area. The patient was taken to the operating room. IV antibiotics were given. The patient's operative site was prepped and draped in a sterile fashion. A time out was performed and all information was confirmed to be correct. Local anesthetic infiltrated. The left temple lesion was curreted to debulk lesion and aid with identification of borders. Excision of lesion with margins was completed. Adjacent skin was elevated in subcutaneous plane to aid with closure. Layered closure with 5-0 vicryl in dermis and running 5-0 prolene for skin closure. Additional simple interrupted 5-0 prolene placed. Steri strips, telfa and dry dressing applied.   The patient was allowed to wake from sedation, extubated and taken to the recovery room in satisfactory condition.   SPECIMENS: left temple basal cell  DRAINS: none

## 2013-07-30 NOTE — Transfer of Care (Signed)
Immediate Anesthesia Transfer of Care Note  Patient: Bryan [name]  Procedure(s) Performed: Procedure(s): EXCISION OF BASAL CELL CARCINOMA  LEFT FOREHEAD  (Left)  Patient Location: PACU  Anesthesia Type:MAC  Level of Consciousness: awake and patient cooperative  Airway & Oxygen Therapy: Patient Spontanous Breathing and Patient connected to face mask oxygen  Post-op Assessment: Report given to PACU RN and Post -op Vital signs reviewed and stable  Post vital signs: Reviewed and stable  Complications: No apparent anesthesia complications

## 2013-07-30 NOTE — Anesthesia Preprocedure Evaluation (Addendum)
Anesthesia Evaluation  Patient identified by MRN, date of birth, ID band Patient awake    Reviewed: Allergy & Precautions, H&P , NPO status , Patient's Chart, lab work & pertinent test results  Airway Mallampati: II  Neck ROM: full    Dental   Pulmonary former smoker,          Cardiovascular + Peripheral Vascular Disease + dysrhythmias Atrial Fibrillation     Neuro/Psych Alzheimer's dz CVA    GI/Hepatic GERD-  ,  Endo/Other    Renal/GU      Musculoskeletal   Abdominal   Peds  Hematology   Anesthesia Other Findings   Reproductive/Obstetrics                          Anesthesia Physical Anesthesia Plan  ASA: III  Anesthesia Plan: General   Post-op Pain Management:    Induction: Intravenous  Airway Management Planned: LMA  Additional Equipment:   Intra-op Plan:   Post-operative Plan:   Informed Consent: I have reviewed the patients History and Physical, chart, labs and discussed the procedure including the risks, benefits and alternatives for the proposed anesthesia with the patient or authorized representative who has indicated his/her understanding and acceptance.     Plan Discussed with: CRNA, Anesthesiologist and Surgeon  Anesthesia Plan Comments:         Anesthesia Quick Evaluation

## 2013-08-02 ENCOUNTER — Encounter (HOSPITAL_BASED_OUTPATIENT_CLINIC_OR_DEPARTMENT_OTHER): Payer: Self-pay | Admitting: Plastic Surgery

## 2013-08-03 DIAGNOSIS — Z7901 Long term (current) use of anticoagulants: Secondary | ICD-10-CM | POA: Diagnosis not present

## 2013-08-03 DIAGNOSIS — I4891 Unspecified atrial fibrillation: Secondary | ICD-10-CM | POA: Diagnosis not present

## 2013-08-03 LAB — POCT INR: INR: 1.2 — AB (ref 0.9–1.1)

## 2013-08-05 ENCOUNTER — Encounter: Payer: Self-pay | Admitting: Geriatric Medicine

## 2013-08-05 ENCOUNTER — Non-Acute Institutional Stay (SKILLED_NURSING_FACILITY): Payer: Medicare Other | Admitting: Geriatric Medicine

## 2013-08-05 DIAGNOSIS — Z7901 Long term (current) use of anticoagulants: Secondary | ICD-10-CM | POA: Diagnosis not present

## 2013-08-05 DIAGNOSIS — R062 Wheezing: Secondary | ICD-10-CM | POA: Insufficient documentation

## 2013-08-05 DIAGNOSIS — L989 Disorder of the skin and subcutaneous tissue, unspecified: Secondary | ICD-10-CM

## 2013-08-05 NOTE — Assessment & Plan Note (Signed)
Anticoagulation resumed January 24, INR 10/27 remained below therapeutic range. Continue current warfarin dose, repeat INR 08/09/13

## 2013-08-05 NOTE — Progress Notes (Signed)
Patient ID: Bryan [name], male   DOB: 01/15/1923, 80 y.o.   MRN: [id number]  [facility] [phone])  Code Status: Full Code  Contact Information   Name Relation Home Work [place] L Spouse [phone]     Beric, [place] Daughter [phone]     [name],[name] Daughter [phone]     Rodney, [place] [phone]        Chief Complaint  Patient presents with  . Wheezing    HPI: This is a 80 y.o. male resident of [place], Memory Care section.  Evaluation requested today in followup of his lower extremity edema as well as coagulation management prior to planned dermatologic surgery.  Last visit: Atrial fibrillation Heart in regular rhythm today, remains well controlled. Continue current medications including anticoagulation  Skin lesion of face Recurrent lesion patient's left parietal scalp area, has been biopsied and returned positive for recurrent skin cancer. Patient is scheduled for surgical excision on 07/29/2013  Long term (current) use of anticoagulants Most recent recent INR in therapeutic range. In anticipation of surgical procedure, will stop warfarin on January 17. Start Lovenox bridge on January 21 for 5 doses (stop Lovenox day prior to surgery). Anticipate restarting warfarin for anticoagulation postop day 1.   Since last visit, patient underwent uneventful excision of left parietal scalp lesion. Warfarin was resumed postop day 1, INR on January 27 remained below therapeutic range as expected. 2 days ago nursing staff reported this patient had a runny nose and some respiratory wheezing. Antihistamine and duo neb treatments were added. The symptoms somewhat improved though not resolved, patient has been more agitated last several days, p.o. intake less than usual.   Allergies  Allergen Reactions  . Penicillins Rash  . Bee Venom    MEDICATION-  Reviewed   DATA REVIEWED  Radiologic  Exams  [facility] mobile x-ray 02/01/2013 x-ray right ribs: No definite right rib fractures can be identified in the lower right rib areas. Right lung is clear in the mid and lower lung area  05/15/2013 three-view x-ray of the right foot: No fracture or dislocation seen. No radiographic evidence for osteomyelitis   [facility] mobile ultrasound 05/16/2013 right lower extremity venous Doppler study negative for DVT  Laboratory Studies   [facility], external 11/17/2012: WBC 6.4, hemoglobin 14.5, hematocrit 41.1, platelets 151.   INR 1.73   Glucose 100, BUN 20, creatinine 1.05, sodium 138, potassium 4.1. LFTs WNL. Albumin 3.3   Total cholesterol 111, triglycerides 57, HDL 40, LDL 60   TSH 2.8   B12 229  01/26/2013 INR 2.41  Glucose 86, BUN 19, creatinine 1.15, sodium 139, potassium 4.2  05/16/2013 Uric acid 5.5     Lab Results  Component Value Date   INR 1.2* 08/03/2013   INR 2.8* 07/13/2013   INR 2.2* 06/29/2013    REVIEW OF SYSTEMS  DATA OBTAINED: from patient, nurse, medical record, caregiver GENERAL:  No fevers, fatigue. Decreased appetite,  SKIN: No itch, rash   RESPIRATORY: No cough, SOB.  Wheezing present  CARDIAC: No chest pain.  Edema. GI: No abdominal pain  No N/V/D or constipation  No heartburn or reflux   Intermittent incontinence GU: No dysuria, frequency or urgency  No change in urine volume or character    Intermittent incontinence NEUROLOGIC: No dizziness, fainting, headache,  Requires more direction (dementia).  PSYCHIATRIC: l.   increased irritability last few days  PHYSICAL EXAM   Filed Vitals:   08/05/13 1340  Temp: 96.7 F (35.9 C)  SpO2: 94%   There is no weight on file to calculate BMI.  GENERAL APPEARANCE: No acute distress, appropriately groomed, Overweight body habitus. Alert, pleasant, conversant.  SKIN:  Surgical site right side of head covered with clean dry Steri-Strip  EYES: Conjunctiva/lids clear. EARS:  Hearing decreased.  NOSE: No deformity or  discharge.  RESPIRATORY: Breathing is even, unlabored. Lungs with faint wheeze posteriorly on the left, anterior upper airway congestion evident, O2 saturation 94% room air  CARDIOVASCULAR: Heart RRR. No murmur or extra heart sounds   VENOUS: Bilateral LE varicosities. No venous stasis skin changes   EDEMA:  trace bilateral lower extremity edema  NEUROLOGIC: Not oriented to time, place. Speech clear,language limited. No tremor.  PSYCHIATRIC: Mood and affect appropriate to situation  ASSESSMENT/PLAN  Skin lesion of face Status post extensive excision skin lesion right parietal scalp area. Patient returned to surgeon today, no new orders.  Long term (current) use of anticoagulants Anticoagulation resumed January 24, INR 10/27 remained below therapeutic range. Continue current warfarin dose, repeat INR 08/09/13  Wheeze Patient with respiratory wheezing and upper airway congestion not improved significantly with antihistamine and DuoNeb treatments. No other sign of acute illness. Obtain chest x-ray today    Follow up: Routine or as needed  Izzy Doubek T.Barron Vanloan, NP-C 08/05/2013

## 2013-08-05 NOTE — Assessment & Plan Note (Signed)
Patient with respiratory wheezing and upper airway congestion not improved significantly with antihistamine and DuoNeb treatments. No other sign of acute illness. Obtain chest x-ray today

## 2013-08-05 NOTE — Assessment & Plan Note (Signed)
Status post extensive excision skin lesion right parietal scalp area. Patient returned to surgeon today, no new orders.

## 2013-08-09 DIAGNOSIS — R062 Wheezing: Secondary | ICD-10-CM | POA: Diagnosis not present

## 2013-08-10 DIAGNOSIS — Z7901 Long term (current) use of anticoagulants: Secondary | ICD-10-CM | POA: Diagnosis not present

## 2013-08-10 LAB — CBC AND DIFFERENTIAL
HEMATOCRIT: 41 % (ref 41–53)
HEMOGLOBIN: 13.7 g/dL (ref 13.5–17.5)
Platelets: 224 10*3/uL (ref 150–399)
WBC: 6.3 10*3/mL

## 2013-08-12 DIAGNOSIS — Z7901 Long term (current) use of anticoagulants: Secondary | ICD-10-CM | POA: Diagnosis not present

## 2013-08-12 LAB — BASIC METABOLIC PANEL
BUN: 29 mg/dL — AB (ref 4–21)
Creatinine: 0.9 mg/dL (ref 0.6–1.3)
GLUCOSE: 88 mg/dL
Potassium: 3.9 mmol/L (ref 3.4–5.3)
SODIUM: 139 mmol/L (ref 137–147)

## 2013-08-12 LAB — POCT INR: INR: 2.3 — AB (ref 0.9–1.1)

## 2013-08-26 DIAGNOSIS — Z7901 Long term (current) use of anticoagulants: Secondary | ICD-10-CM | POA: Diagnosis not present

## 2013-09-03 DIAGNOSIS — Z7901 Long term (current) use of anticoagulants: Secondary | ICD-10-CM | POA: Diagnosis not present

## 2013-09-07 DIAGNOSIS — Z7901 Long term (current) use of anticoagulants: Secondary | ICD-10-CM | POA: Diagnosis not present

## 2013-09-07 LAB — POCT INR: INR: 1.9 — AB (ref 0.9–1.1)

## 2013-09-13 DIAGNOSIS — R05 Cough: Secondary | ICD-10-CM | POA: Diagnosis not present

## 2013-09-13 DIAGNOSIS — R059 Cough, unspecified: Secondary | ICD-10-CM | POA: Diagnosis not present

## 2013-09-14 DIAGNOSIS — Z7901 Long term (current) use of anticoagulants: Secondary | ICD-10-CM | POA: Diagnosis not present

## 2013-09-14 LAB — POCT INR: INR: 1.9 — AB (ref 0.9–1.1)

## 2013-09-16 DIAGNOSIS — Z7901 Long term (current) use of anticoagulants: Secondary | ICD-10-CM | POA: Diagnosis not present

## 2013-09-16 LAB — POCT INR: INR: 1.9 — AB (ref 0.9–1.1)

## 2013-09-20 DIAGNOSIS — I509 Heart failure, unspecified: Secondary | ICD-10-CM | POA: Diagnosis not present

## 2013-09-21 ENCOUNTER — Non-Acute Institutional Stay (SKILLED_NURSING_FACILITY): Payer: Medicare Other | Admitting: Geriatric Medicine

## 2013-09-21 ENCOUNTER — Encounter: Payer: Self-pay | Admitting: Geriatric Medicine

## 2013-09-21 DIAGNOSIS — Z7901 Long term (current) use of anticoagulants: Secondary | ICD-10-CM

## 2013-09-21 DIAGNOSIS — R062 Wheezing: Secondary | ICD-10-CM

## 2013-09-21 DIAGNOSIS — I4891 Unspecified atrial fibrillation: Secondary | ICD-10-CM | POA: Diagnosis not present

## 2013-09-21 DIAGNOSIS — R609 Edema, unspecified: Secondary | ICD-10-CM | POA: Diagnosis not present

## 2013-09-21 DIAGNOSIS — Z79899 Other long term (current) drug therapy: Secondary | ICD-10-CM | POA: Diagnosis not present

## 2013-09-21 DIAGNOSIS — R600 Localized edema: Secondary | ICD-10-CM

## 2013-09-21 LAB — BASIC METABOLIC PANEL
BUN: 27 mg/dL — AB (ref 4–21)
CREATININE: 1 mg/dL (ref 0.6–1.3)
Glucose: 88 mg/dL
Potassium: 3.8 mmol/L (ref 3.4–5.3)
Sodium: 140 mmol/L (ref 137–147)

## 2013-09-21 LAB — POCT INR: INR: 2.1 — AB (ref 0.9–1.1)

## 2013-09-21 NOTE — Assessment & Plan Note (Signed)
Wheeze and upper airway congestion  has resolved with a combination of antihistamine and diuretic. Recent chest x-ray with evidence of congestive changes, echocardiogram results are pending. Lab study is satisfactory, continue diuretic.

## 2013-09-21 NOTE — Assessment & Plan Note (Signed)
INR remains in therapeutic range, continue current warfarin dose, next INR in 4 weeks

## 2013-09-21 NOTE — Assessment & Plan Note (Signed)
Heart in irregular rhythm today, rate remains well controlled. No rate or rhythm medications prescribed. Continue anticoagulation

## 2013-09-21 NOTE — Progress Notes (Signed)
Patient ID: Bryan [name], male   DOB: 1922-11-24, 80 y.o.   MRN: [id number]   [facility] [phone])  Code Status: Full Code Contact Information   Name Relation Home Work [place] L Spouse [phone]     Suave, Mckittrick Daughter [phone]     [name],[name] Daughter [phone]     Orlandus, [place] [phone]        Chief Complaint  Patient presents with  . Wheezing    HPI: This is a 80 y.o. male resident of [place], [place] Section evaluated today in followup of respiratory issues. In the last 2 weeks patient has had symptoms of viral upper respiratory infection with cough, runny nose, watery eyes. He was treated with oral and nasal antihistamine. On March 8 on-call provider was contacted due to 2 nurse's assessment of respiratory wheezing. DuoNeb treatments were added as well as empiric Levaquin. A chest x-ray showed evidence of mild congestive heart failure, patient was also prescribed Lasix for 3 days. Patient had modest weight loss (3lb) with addition of this diuretic, Lasix was restarted 3/12 with further modest weight loss(3lb). Last week patient was noted to have continued upper airway congestion, less coughing, some mild improvement in his chronic lower extremity edema. Following telephone discussion with patient's son [name], in light of new finding of congestive changes on x-ray, an echocardiogram was ordered to further evaluate heart function. Final interpretation pending.  INR has been followed closely due to use of antibiotic, has remained in therapeutic range.  BMP returned this morning satisfactory; renal function is stable, electrolytes within normal limits    Allergies  Allergen Reactions  . Penicillins Rash  . Bee Venom     MEDICATIONS -  Reviewed  DATA REVIEWED  Radiologic Exams  [facility] mobile x-ray 09/13/2012 chest x-ray: Unchanged minimal cardiomegaly. Prominent pulmonary vascular  chair suggestive of mild congestive changes. No focal pulmonary consolidation or pleural effusions identified  Cardiovascular Exams:   Laboratory Studies:   Lab Results  Component Value Date   NA 140 09/21/2013   K 3.8 09/21/2013   GLU 88 09/21/2013   BUN 27* 09/21/2013   CREATININE 1.0 09/21/2013    Lab Results  Component Value Date   INR 2.1* 09/21/2013   INR 1.9* 09/16/2013   INR 1.9* 09/14/2013    REVIEW OF SYSTEMS  DATA OBTAINED: from patient, medical record, caregiver GENERAL: Feels well   No recent fever, fatigue, change in appetite or weight SKIN: No itch, rash or open wounds NOSE: No congestion, drainage or bleeding MOUTH/THROAT: No mouth or tooth pain  No sore throat   No difficulty chewing or swallowing RESPIRATORY: Occ. Cough, No wheezing, SOB CARDIAC: No chest pain, palpitations  Chronic edema. GI: No abdominal pain  No nausea, vomiting,diarrhea or constipation  No heartburn or reflux  GU: No dysuria, frequency or urgency  No change in urine volume or character   MUSCULOSKELETAL: No joint pain, swelling  No back pain  No muscle ache, pain, weakness  Gait is steady with walker  No recent falls.  NEUROLOGIC: No dizziness, fainting, headache,  No change in mental status (dementia).  PSYCHIATRIC: No signs of anxiety, depression  Sleeps well.  No behavior issue.    PHYSICAL EXAM Filed Vitals:   09/21/13 1021  BP: 136/70  Pulse: 74  Weight: 218 lb 11.2 oz (99.202 kg)   Body mass index is 28.86 kg/(m^2). GENERAL APPEARANCE: No acute distress, appropriately groomed, normal body habitus. Alert, pleasant,interactive, conversation is difficult  due to language deficit. SKIN: No diaphoresis, rash HEAD: Normocephalic, atraumatic EYES: Conjunctiva/lids clear.  MOUTH/THROAT: Lips w/o lesions. Oral mucosa, tongue moist, w/o lesion.  RESPIRATORY: Breathing is even, unlabored. Lung sounds are clear and full.  CARDIOVASCULAR: Heart IRRR. No murmur or extra heart sounds   EDEMA:  Trace-1+ bilateral LE edema.  GASTROINTESTINAL: Abdomen is soft, non-tender, not distended w/ normal bowel sounds. MUSCULOSKELETAL: . Gait is steady with walker NEUROLOGIC: Not oriented to time, place (dementia). Speech clear,unintelligible,  no tremor. Marland Kitchen PSYCHIATRIC: Mood and affect appropriate to situation   ASSESSMENT/PLAN  Atrial fibrillation Heart in irregular rhythm today, rate remains well controlled. No rate or rhythm medications prescribed. Continue anticoagulation  Edema of both legs 7 improvement in bilateral lower extremity edema with addition of furosemide. Continue medication, compression hose and daily ambulation  Wheeze Wheeze and upper airway congestion  has resolved with a combination of antihistamine and diuretic. Recent chest x-ray with evidence of congestive changes, echocardiogram results are pending. Lab study is satisfactory, continue diuretic.    Family/ staff Communication:     Labs/tests ordered:    Follow up: As needed  Mardene Celeste, NP-C [facility] [phone]  09/21/2013

## 2013-09-21 NOTE — Assessment & Plan Note (Signed)
7 improvement in bilateral lower extremity edema with addition of furosemide. Continue medication, compression hose and daily ambulation

## 2013-09-22 NOTE — Addendum Note (Signed)
Addended by: Lestine Box T on: 09/22/2013 08:41 AM   Modules accepted: Level of Service

## 2013-10-19 DIAGNOSIS — Z7901 Long term (current) use of anticoagulants: Secondary | ICD-10-CM | POA: Diagnosis not present

## 2013-10-19 LAB — POCT INR: INR: 2.8 — AB (ref 0.9–1.1)

## 2013-11-02 ENCOUNTER — Encounter: Payer: Self-pay | Admitting: Geriatric Medicine

## 2013-11-02 ENCOUNTER — Non-Acute Institutional Stay (SKILLED_NURSING_FACILITY): Payer: Medicare Other | Admitting: Geriatric Medicine

## 2013-11-02 DIAGNOSIS — I4891 Unspecified atrial fibrillation: Secondary | ICD-10-CM

## 2013-11-02 DIAGNOSIS — Z7901 Long term (current) use of anticoagulants: Secondary | ICD-10-CM | POA: Diagnosis not present

## 2013-11-02 DIAGNOSIS — F0280 Dementia in other diseases classified elsewhere without behavioral disturbance: Secondary | ICD-10-CM | POA: Diagnosis not present

## 2013-11-02 DIAGNOSIS — R609 Edema, unspecified: Secondary | ICD-10-CM | POA: Diagnosis not present

## 2013-11-02 DIAGNOSIS — G309 Alzheimer's disease, unspecified: Secondary | ICD-10-CM

## 2013-11-02 DIAGNOSIS — R600 Localized edema: Secondary | ICD-10-CM

## 2013-11-02 MED ORDER — FUROSEMIDE 40 MG PO TABS: 40.0000 mg | ORAL_TABLET | Freq: Every day | ORAL | Status: DC

## 2013-11-02 NOTE — Assessment & Plan Note (Signed)
Weight was stable at 207-218 lbs last several weeks, jumped up to 222lbs in the last week. Mild increase Rt.LE edema, c/o knee pian with extended ambulation. Increase diuretic x 3 days, add low dose KCl as well. Recheck BMP with next INR

## 2013-11-02 NOTE — Progress Notes (Signed)
Patient ID: Bryan [name], male   DOB: 24-Sep-1922, 80 y.o.   MRN: [id number]   [facility] [phone])  Code Status: Full Code  Contact Information   Name Relation Home Work [place] L Spouse [phone]     Edwards, Mindel Daughter [phone]     [name],[name] Daughter [phone]     Russel, [place] [phone]        Chief Complaint  Patient presents with  . Medical Management of Chronic Issues    HPI: This is a 80 y.o. male resident of [place], Memory Care Section  evaluated today for management of ongoing medical issues.   Recent visits:  Atrial fibrillation Heart in irregular rhythm today, rate remains well controlled. No rate or rhythm medications prescribed. Continue anticoagulation Edema of both legs 7 improvement in bilateral lower extremity edema with addition of furosemide. Continue medication, compression hose and daily ambulation Wheeze Wheeze and upper airway congestion  has resolved with a combination of antihistamine and diuretic. Recent chest x-ray with evidence of congestive changes, echocardiogram results are pending. Lab study is satisfactory, continue diuretic.  Since last visit no acute medical issues. Patient has been cleaning complaining of his left knee hurting this is noted after long walks. He has not had any falls recently. Caregiver reports he is frequently grumpy, but no significant behavioral issues. Most recent MDS review was in February 2015, this reflects occasional agitation agitated behavior but does not interfere with care. Patient requires extensive assistance with many ADLs. Review of facility record shows patient's vital signs have been stable, his weight has increased from 218 pounds to 222 pounds in the last 6 days. For several weeks prior to this weight was stable between 217 and 218 pounds. Most recent INR remains in therapeutic range      Allergies  Allergen  Reactions  . Penicillins Rash  . Bee Venom     MEDICATIONS -  Reviewed  DATA REVIEWED  Radiologic Exams  [facility] mobile x-ray 09/13/2012 chest x-ray: Unchanged minimal cardiomegaly. Prominent pulmonary vascular chair suggestive of mild congestive changes. No focal pulmonary consolidation or pleural effusions identified  Cardiovascular Exams 09/20/13 2D Echocardiogram; Technically difficult study, unable to estimate LVEF, appears grossly WNL. LA mildly enlarged, RA moderately enlarged.   Laboratory Studies:   Lab Results  Component Value Date   WBC 6.3 08/10/2013   HGB 13.7 08/10/2013   HCT 41 08/10/2013   MCV 90.4 08/10/2013   PLT 224 08/10/2013    Lab Results  Component Value Date   NA 140 09/21/2013   K 3.8 09/21/2013   GLU 88 09/21/2013   BUN 27* 09/21/2013   CREATININE 1.0 09/21/2013    Lab Results  Component Value Date   INR 2.8* 10/19/2013   INR 2.1* 09/21/2013   INR 1.9* 09/16/2013    REVIEW OF SYSTEMS  DATA OBTAINED: from patient, medical record, caregiver GENERAL: Feels well   No recent fever, fatigue, change in appetite. SKIN: No itch, rash or open wounds NOSE: No congestion, drainage or bleeding MOUTH/THROAT: No mouth or tooth pain  No sore throat   No difficulty chewing or swallowing RESPIRATORY: Occ. Cough, No wheezing, SOB CARDIAC: No chest pain, palpitations  Chronic edema. GI: No abdominal pain  No nausea, vomiting,diarrhea or constipation  No heartburn or reflux Frequently incontinent GU: No dysuria, frequency or urgency  No change in urine volume or character  Frequently incontinent MUSCULOSKELETAL: Intermittent right knee pain, mild swelling  No back pain  No  muscle ache, pain, weakness  Gait is steady with walker  No recent falls.  NEUROLOGIC: No dizziness, fainting, headache,  No change in mental status (dementia).  PSYCHIATRIC: No signs of anxiety, depression  Sleeps well.  No behavior issue.    PHYSICAL EXAM Filed Vitals:   11/02/13 1143  BP: 105/55    Pulse: 64  Temp: 96.4 F (35.8 C)  Resp: 18  Weight: 222 lb 4.8 oz (100.835 kg)  SpO2: 97%   Body mass index is 29.34 kg/(m^2).  GENERAL APPEARANCE: No acute distress, appropriately groomed, normal body habitus. Alert, pleasant,interactive, conversation is difficult due to language deficit.  He has difficulty following directions SKIN: No diaphoresis, rash HEAD: Normocephalic, atraumatic EYES: Conjunctiva/lids clear.  MOUTH/THROAT: Lips w/o lesions. Oral mucosa, tongue moist, w/o lesion.  RESPIRATORY: Breathing is even, unlabored. Lungs with few scattered  wheezes anteriorly. Posterior lung fields are clear sounds are clear and full.  CARDIOVASCULAR: Heart IRRR. No murmur or extra heart sounds   EDEMA: 2+ left ankle edema, 1+ right ankle edema   GASTROINTESTINAL: Abdomen is soft, non-tender, not distended w/ normal bowel sounds. MUSCULOSKELETAL: . Gait is steady with walker NEUROLOGIC: Not oriented to time, place (dementia). Speech clear,unintelligible,  no tremor. Marland Kitchen PSYCHIATRIC: Mood and affect appropriate to situation   ASSESSMENT/PLAN  Edema of both legs Weight was stable at 207-218 lbs last several weeks, jumped up to 222lbs in the last week. Mild increase Rt.LE edema, c/o knee pian with extended ambulation. Increase diuretic x 3 days, add low dose KCl as well. Recheck BMP with next INR  Atrial fibrillation Heart remains in a regular rhythm, rate satisfactory. Continue current medication including anticoagulation  Alzheimer's disease Most recent MDS February 2015: BIMS3/15,PHQ-9 9/27. Occasional agitated behaviors noted. He requires extensive assistance with dressing, toileting, bathing. History incontinent of both bowel and bladder. Requires limited assistance with mobility. Requires supervision for eating. Continues to have a caregiver daily who who assists with direction and continues to take him on short trips out of the building and walks around the building. Continue  current medication and supportive environment of memory care unit  Long term (current) use of anticoagulants Most recent INR remains in therapeutic range, continue current warfarin dose, next INR May 5    Family/ staff Communication:  Findings and plan discussed with primary caregiver   Labs/tests ordered: 11/09/2013 BNP, INR   Follow up: Return for Routine/as needed.   Mardene Celeste, NP-C [place] [phone]  11/02/2013

## 2013-11-04 NOTE — Assessment & Plan Note (Addendum)
Most recent MDS February 2015: BIMS3/15,PHQ-9 9/27. Occasional agitated behaviors noted. He requires extensive assistance with dressing, toileting, bathing. History incontinent of both bowel and bladder. Requires limited assistance with mobility. Requires supervision for eating. Continues to have a caregiver daily who who assists with direction and continues to take him on short trips out of the building and walks around the building. Continue current medication and supportive environment of memory care unit

## 2013-11-04 NOTE — Assessment & Plan Note (Signed)
Heart remains in a regular rhythm, rate satisfactory. Continue current medication including anticoagulation

## 2013-11-04 NOTE — Assessment & Plan Note (Addendum)
Most recent INR remains in therapeutic range, continue current warfarin dose, next INR May 5

## 2013-11-09 DIAGNOSIS — Z79899 Other long term (current) drug therapy: Secondary | ICD-10-CM | POA: Diagnosis not present

## 2013-11-09 DIAGNOSIS — R609 Edema, unspecified: Secondary | ICD-10-CM | POA: Diagnosis not present

## 2013-11-09 LAB — BASIC METABOLIC PANEL
BUN: 31 mg/dL — AB (ref 4–21)
Creatinine: 1.2 mg/dL (ref 0.6–1.3)
Glucose: 94 mg/dL
POTASSIUM: 4.1 mmol/L (ref 3.4–5.3)
SODIUM: 138 mmol/L (ref 137–147)

## 2013-11-09 LAB — POCT INR: INR: 3.1 — AB (ref 0.9–1.1)

## 2013-11-23 DIAGNOSIS — Z79899 Other long term (current) drug therapy: Secondary | ICD-10-CM | POA: Diagnosis not present

## 2013-11-23 DIAGNOSIS — Z7901 Long term (current) use of anticoagulants: Secondary | ICD-10-CM | POA: Diagnosis not present

## 2013-11-23 LAB — POCT INR: INR: 2.5 — AB (ref 0.9–1.1)

## 2013-12-09 DIAGNOSIS — F0281 Dementia in other diseases classified elsewhere with behavioral disturbance: Secondary | ICD-10-CM | POA: Diagnosis not present

## 2013-12-09 DIAGNOSIS — F02818 Dementia in other diseases classified elsewhere, unspecified severity, with other behavioral disturbance: Secondary | ICD-10-CM | POA: Diagnosis not present

## 2013-12-10 DIAGNOSIS — F02818 Dementia in other diseases classified elsewhere, unspecified severity, with other behavioral disturbance: Secondary | ICD-10-CM | POA: Diagnosis not present

## 2013-12-10 DIAGNOSIS — F0280 Dementia in other diseases classified elsewhere without behavioral disturbance: Secondary | ICD-10-CM | POA: Diagnosis not present

## 2013-12-10 DIAGNOSIS — F0281 Dementia in other diseases classified elsewhere with behavioral disturbance: Secondary | ICD-10-CM | POA: Diagnosis not present

## 2013-12-10 DIAGNOSIS — R4182 Altered mental status, unspecified: Secondary | ICD-10-CM | POA: Diagnosis not present

## 2013-12-10 DIAGNOSIS — G309 Alzheimer's disease, unspecified: Secondary | ICD-10-CM | POA: Diagnosis not present

## 2013-12-14 ENCOUNTER — Non-Acute Institutional Stay (SKILLED_NURSING_FACILITY): Payer: Medicare Other | Admitting: Geriatric Medicine

## 2013-12-14 ENCOUNTER — Encounter: Payer: Self-pay | Admitting: Geriatric Medicine

## 2013-12-14 DIAGNOSIS — F0280 Dementia in other diseases classified elsewhere without behavioral disturbance: Secondary | ICD-10-CM | POA: Diagnosis not present

## 2013-12-14 DIAGNOSIS — G309 Alzheimer's disease, unspecified: Secondary | ICD-10-CM

## 2013-12-14 DIAGNOSIS — I4891 Unspecified atrial fibrillation: Secondary | ICD-10-CM | POA: Diagnosis not present

## 2013-12-14 DIAGNOSIS — R609 Edema, unspecified: Secondary | ICD-10-CM

## 2013-12-14 DIAGNOSIS — R600 Localized edema: Secondary | ICD-10-CM

## 2013-12-14 DIAGNOSIS — Z7901 Long term (current) use of anticoagulants: Secondary | ICD-10-CM | POA: Diagnosis not present

## 2013-12-14 MED ORDER — DIVALPROEX SODIUM 125 MG PO CPSP: 500.0000 mg | ORAL_CAPSULE | Freq: Two times a day (BID) | ORAL | Status: DC

## 2013-12-14 NOTE — Assessment & Plan Note (Signed)
Weight has not changed since last visit (222-225lb), LE edema is improved. Most recent BMP with mild bum in BUN/Cr. Continue curretn diuretic, update lab at intervals

## 2013-12-14 NOTE — Assessment & Plan Note (Signed)
Heart remains in a regular rhythm, rate satisfactory. Continue current medication including anticoagulation

## 2013-12-14 NOTE — Assessment & Plan Note (Addendum)
Mood and behavior (impulsive/ aggressive) addressed by Dr.Plovsky; started Depakote, ordered U/A. No UTI. Medication appears to be effective though he is not sleeping at night. When dose increases to 500mg , schedule BID dosing.  Functional status is unchanged, significant language deficit present, able to make his needs known. Continue current medication and supportive environment of memory care unit

## 2013-12-14 NOTE — Progress Notes (Signed)
Patient ID: Bryan [name], male   DOB: 07/23/1922, 80 y.o.   MRN: [id number]   [facility] [phone])  Code Status: Full Code  Contact Information   Name Relation Home Work [place] L Spouse [phone]     Geraldine, Alessandrini Daughter [phone]     [name],[name] Daughter [phone]     Srinath, Blackmer [phone]        Chief Complaint  Patient presents with  . Medical Management of Chronic Issues    HPI: This is a 80 y.o. male resident of [place], Memory Care Section  evaluated today for management of ongoing medical issues.   Recent visits:  Edema of both legs Weight was stable at 207-218 lbs last several weeks, jumped up to 222lbs in the last week. Mild increase Rt.LE edema, c/o knee pian with extended ambulation. Increase diuretic x 3 days, add low dose KCl as well. Recheck BMP with next INR Atrial fibrillation Heart remains in a regular rhythm, rate satisfactory. Continue current medication including anticoagulation Alzheimer's disease Most recent MDS February 2015: BIMS3/15,PHQ-9 9/27. Occasional agitated behaviors noted. He requires extensive assistance with dressing, toileting, bathing. History incontinent of both bowel and bladder. Requires limited assistance with mobility. Requires supervision for eating. Continues to have a caregiver daily who who assists with direction and continues to take him on short trips out of the building and walks around the building. Continue current medication and supportive environment of memory care unit Long term (current) use of anticoagulants Most recent INR remains in therapeutic range, continue current warfarin dose, next INR May 5  Since last visit, dose reduction in lorazepam was attempted (CMS guidelines), patient had significant increase in agitation. Medication resumed, continued with impulsive/ aggressive outbursts. Was evaluated by psychiatry (Dr.Plovsky) 6/4,  Depakote started.Review of record shows patient has been awake overnight, new since Depakote. General mood/ behavior improved.  Facility record shows stable vital signs, weight range 222-225 last 7 days. Most recent BMP satisfactory. Coagulation status is stable.   Allergies  Allergen Reactions  . Penicillins Rash  . Bee Venom     MEDICATIONS -  Reviewed  DATA REVIEWED  Radiologic Exams  Quality mobile x-ray 09/13/2012 chest x-ray: Unchanged minimal cardiomegaly. Prominent pulmonary vascular chair suggestive of mild congestive changes. No focal pulmonary consolidation or pleural effusions identified  Cardiovascular Exams 09/20/13 2D Echocardiogram; Technically difficult study, unable to estimate LVEF, appears grossly WNL. LA mildly enlarged, RA moderately enlarged.   Laboratory Studies:   Lab Results  Component Value Date   WBC 6.3 08/10/2013   HGB 13.7 08/10/2013   HCT 41 08/10/2013   MCV 90.4 08/10/2013   PLT 224 08/10/2013    Lab Results  Component Value Date   NA 138 11/09/2013   K 4.1 11/09/2013   GLU 94 11/09/2013   BUN 31* 11/09/2013   CREATININE 1.2 11/09/2013    Lab Results  Component Value Date   INR 2.5* 11/23/2013   INR 3.1* 11/09/2013   INR 2.8* 10/19/2013    REVIEW OF SYSTEMS  DATA OBTAINED: from patient, medical record, caregiver GENERAL: Feels well   No recent fever, fatigue, change in appetite. SKIN: No itch, rash or open wounds NOSE: No congestion, drainage or bleeding MOUTH/THROAT: No mouth or tooth pain  No sore throat   No difficulty chewing or swallowing RESPIRATORY: Occ. Cough, No wheezing, SOB CARDIAC: No chest pain, palpitations  Chronic edema. GI: No abdominal pain  No nausea, vomiting,diarrhea or constipation  No heartburn  or reflux Frequently incontinent GU: No dysuria, frequency or urgency  No change in urine volume or character  Frequently incontinent MUSCULOSKELETAL: Intermittent right knee pain, mild swelling  No back pain  No muscle ache, pain,  weakness  Gait is steady with walker  No recent falls.  NEUROLOGIC: No dizziness, fainting, headache,  No change in mental status (dementia).  PSYCHIATRIC: No signs of anxiety, depression  Sleeps well.  No behavior issue.    PHYSICAL EXAM Filed Vitals:   12/14/13 1048  BP: 141/69  Pulse: 79  Temp: 95.6 F (35.3 C)  Resp: 20  Weight: 225 lb 4.8 oz (102.195 kg)  SpO2: 100%   Body mass index is 29.73 kg/(m^2).  GENERAL APPEARANCE: No acute distress, appropriately groomed, normal body habitus. Alert, pleasant,interactive, conversation is difficult due to language deficit (nonsensical speech).  Follows simple directions SKIN: No diaphoresis, rash HEAD: Normocephalic, atraumatic EYES: Conjunctiva/lids clear.  MOUTH/THROAT: Lips w/o lesions. Oral mucosa, tongue moist, w/o lesion.  RESPIRATORY: Breathing is even, unlabored. Lung sounds are clear and full.  CARDIOVASCULAR: Heart IRRR. No murmur or extra heart sounds   EDEMA: Trace ankle edema bilateral  GASTROINTESTINAL: Abdomen is soft, non-tender, not distended w/ normal bowel sounds. MUSCULOSKELETAL: . Gait is steady with walker NEUROLOGIC: Not oriented to time, place (dementia). Speech clear,unintelligible,  no tremor. Marland Kitchen PSYCHIATRIC: Mood and affect appropriate to situation   ASSESSMENT/PLAN  Atrial fibrillation Heart remains in a regular rhythm, rate satisfactory. Continue current medication including anticoagulation   Alzheimer's disease Mood and behavior (impulsive/ aggressive) addressed by Dr.Plovsky; started Depakote, ordered U/A. No UTI. Medication appears to be effective though he is not sleeping at night. When dose increases to 500mg , schedule BID dosing.  Functional status is unchanged, significant language deficit present, able to make his needs known. Continue current medication and supportive environment of memory care unit  Long term (current) use of anticoagulants Most recent INR remains in therapeutic range,  continue current warfarin dose, next INR May16   Edema of both legs Weight has not changed since last visit (222-225lb), LE edema is improved. Most recent BMP with mild bum in BUN/Cr. Continue curretn diuretic, update lab at intervals    Family/ staff Communication:  Findings and plan discussed with primary caregiver. Code status discussed at most recent [place], spouse was discuss further with family. Patient remains Full Code. Recommend this be discussed again at next Care Plan meeting   Labs/tests ordered:  INR 12/21/13   Follow up: Return for Routine/as needed.   Mardene Celeste, NP-C [place] [phone]  12/14/2013

## 2013-12-14 NOTE — Assessment & Plan Note (Signed)
Most recent INR remains in therapeutic range, continue current warfarin dose, next INR May16

## 2013-12-21 DIAGNOSIS — Z79899 Other long term (current) drug therapy: Secondary | ICD-10-CM | POA: Diagnosis not present

## 2013-12-21 DIAGNOSIS — Z7901 Long term (current) use of anticoagulants: Secondary | ICD-10-CM | POA: Diagnosis not present

## 2013-12-29 DIAGNOSIS — F0391 Unspecified dementia with behavioral disturbance: Secondary | ICD-10-CM

## 2013-12-29 DIAGNOSIS — F03918 Unspecified dementia, unspecified severity, with other behavioral disturbance: Secondary | ICD-10-CM | POA: Insufficient documentation

## 2013-12-29 HISTORY — DX: Unspecified dementia, unspecified severity, with other behavioral disturbance: F03.918

## 2013-12-29 HISTORY — DX: Unspecified dementia with behavioral disturbance: F03.91

## 2013-12-30 ENCOUNTER — Non-Acute Institutional Stay (SKILLED_NURSING_FACILITY): Payer: Medicare Other | Admitting: Internal Medicine

## 2013-12-30 DIAGNOSIS — F23 Brief psychotic disorder: Secondary | ICD-10-CM

## 2013-12-30 DIAGNOSIS — F0281 Dementia in other diseases classified elsewhere with behavioral disturbance: Secondary | ICD-10-CM

## 2013-12-30 DIAGNOSIS — F02818 Dementia in other diseases classified elsewhere, unspecified severity, with other behavioral disturbance: Secondary | ICD-10-CM

## 2013-12-30 DIAGNOSIS — G309 Alzheimer's disease, unspecified: Principal | ICD-10-CM

## 2013-12-30 DIAGNOSIS — F0280 Dementia in other diseases classified elsewhere without behavioral disturbance: Secondary | ICD-10-CM | POA: Diagnosis not present

## 2013-12-30 DIAGNOSIS — F29 Unspecified psychosis not due to a substance or known physiological condition: Secondary | ICD-10-CM | POA: Diagnosis not present

## 2013-12-30 NOTE — Progress Notes (Signed)
Patient ID: Bryan [name], male   DOB: 03/04/1923, 80 y.o.   MRN: [id number]    [facility]  Chief Complaint  Patient presents with  . Acute Visit    agitation and behavior changes   Allergies  Allergen Reactions  . Penicillins Rash  . Bee Venom    HPI:  80 y.o. male resident of [place], [place] Section is seen today for acute issue. He has been getting agitate recently and more frequently over past few days. His behavior changes have been there for 6 months as per nursing staff but over past week he has been getting agitate to a point where he is a throat to staff. He has been seen by psych services 2 weeks back and started on depakote sprinkles and is currently on 250 mg bid along with ativan once a day and additional one dose prn. Patient unable to provide any history or ROS. He appears to be actively hallucinating at present and staff mentions him this way on and off for few weeks now.  He has history of afib, alzhimer's dementia among others.  As per staff no change in behavior with depakote on board No falls reported No fever or chills Patient denies hurting anywhere No change in bowel/ bladder habit as per staff  Past Medical History  Diagnosis Date  . Hyperlipidemia   . Alzheimer's disease 2013  . GERD (gastroesophageal reflux disease)   . BPH (benign prostatic hyperplasia)   . Atrial fibrillation     Long term anti coagulation w/ warfarin fro stroke risk reduction  . 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 Lactose intolerance   . Allergic rhinitis   . Long term (current) use of anticoagulants 11/23/2012    Long-term anticoagulation with warfarin for stroke risk reduction related to AF  . Gait disorder   . Chronic venous insufficiency 01/12/2013   Medication reviewed. See [facility]  Physical exam Nursing note reviewed. Vital signs stable, afebrile  GENERAL APPEARANCE: No acute distress, alert of his surrounding,  pleasantly confused at present, has language deficit to some extent HEAD: Normocephalic, atraumatic EYES: Conjunctiva clear.   MOUTH/THROAT: Lips w/o lesions. Oral mucosa, tongue moist, w/o lesion.   RESPIRATORY: Breathing is even, unlabored. Lung sounds are clear  CARDIOVASCULAR: irregular heart rate, b/l trace edema GASTROINTESTINAL: Abdomen is soft, non-tender, normal bowel sounds. MUSCULOSKELETAL: . Gait is steady with walker NEUROLOGIC: alert to surrounding, calm this visit, hallucinating PSYCHIATRIC: talking about having packaging to do and about people chopping arms  Assessment/plan  alzhimer's disease with behavioral disturbance Continue aricept and namenda xr. Will have him continue depakote sprinkle current dose and add seroquel 25 mg qhs to help with ? Delirium and agitation in setting of his memory loss.  Acute psychosis Likely related to progression of his alzhimer's. With his hallucination and worsening agitation where staff feel as he is of threat, will have him started on risperidal 0.5 mg bid and reassess. Will also get to follow up with psych services

## 2013-12-31 ENCOUNTER — Telehealth: Payer: Self-pay | Admitting: Internal Medicine

## 2013-12-31 NOTE — Telephone Encounter (Signed)
Returned the call and Spoke with Mr Bryan [name], patient's son. He was concerned regarding the medication changes from yesterday's visit. I had seen Mr Bryan [name] for worsening agitation and had introduced risperidal 0.5 mg bid and seroquel 25 mg at bedtime to help with agitation and psychosis most likely related to progressive dementia and some delirium. I explained the reason to the son and he seems to agree with the plan. I informed him about not going up or having ativan on scheduled basis given his age and side effects with benzodiazepine. Son agrees. Also informed that Hassell Done, NP will see patient on Tuesday and assess for change/ improvement. If needed, will have psychiatry team see patient sooner. Son agrees with the plan  Will forward this communication to Dr Nyoka Cowden

## 2014-01-04 ENCOUNTER — Non-Acute Institutional Stay (SKILLED_NURSING_FACILITY): Payer: Medicare Other | Admitting: Nurse Practitioner

## 2014-01-04 DIAGNOSIS — F02818 Dementia in other diseases classified elsewhere, unspecified severity, with other behavioral disturbance: Secondary | ICD-10-CM

## 2014-01-04 DIAGNOSIS — F0281 Dementia in other diseases classified elsewhere with behavioral disturbance: Secondary | ICD-10-CM

## 2014-01-04 DIAGNOSIS — F23 Brief psychotic disorder: Secondary | ICD-10-CM

## 2014-01-04 DIAGNOSIS — F0280 Dementia in other diseases classified elsewhere without behavioral disturbance: Secondary | ICD-10-CM

## 2014-01-04 DIAGNOSIS — F29 Unspecified psychosis not due to a substance or known physiological condition: Secondary | ICD-10-CM

## 2014-01-04 DIAGNOSIS — G309 Alzheimer's disease, unspecified: Secondary | ICD-10-CM | POA: Diagnosis not present

## 2014-01-04 NOTE — Progress Notes (Signed)
Patient ID: Bryan [name], male   DOB: 04/03/1923, 80 y.o.   MRN: [id number]    Nursing Home Location:  [place] of Service: SNF (31)  PCP: Estill Dooms, MD  Allergies  Allergen Reactions  . Penicillins Rash  . Bee Venom     Chief Complaint  Patient presents with  . Medical Management of Chronic Issues    HPI:  80 y.o. male resident of [place], [place] Section is seen today for follow up on psychosis; pt was seen last week due to increase in agitation which is likely progression on dementia. Pt was currently on namenda, aricept and Depakote.  He was started on seroquel and risperdal. Caregiver who is with him during day reports she has noted improvement with agitation since medications have been started. Less outburst and ADLs have improved. Does notice some slowing of his gait but has not been unsteady at this time.  No falls reported, No fever or chills, denies pain, no shortness of breath, changes in bowel/ bladder habit, no lethargy or changes in LOC as per staff   Review of Systems:  Patient unable to provide any history or ROS. Past Medical History  Diagnosis Date  . Hyperlipidemia   . Alzheimer's disease 2013  . GERD (gastroesophageal reflux disease)   . BPH (benign prostatic hyperplasia)   . Atrial fibrillation     Long term anti coagulation w/ warfarin fro stroke risk reduction  . 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 Lactose intolerance   . Allergic rhinitis   . Long term (current) use of anticoagulants 11/23/2012    Long-term anticoagulation with warfarin for stroke risk reduction related to AF  . Gait disorder   . Chronic venous insufficiency 01/12/2013   Past Surgical History  Procedure Laterality Date  . Cataract extraction w/ intraocular lens  implant, bilateral    . Hernia repair Right 1970s  . Retinal detachment surgery Right 2009  . Knee surgery Right 2000  . Mass  excision Left 07/30/2013    Procedure: EXCISION OF BASAL CELL CARCINOMA  LEFT FOREHEAD ;  Surgeon: Irene Limbo, MD;  Location: [facility];  Service: Plastics;  Laterality: Left;   Social History:   reports that he quit smoking about 30 years ago. He quit smokeless tobacco use about 43 years ago. He reports that he drinks alcohol. He reports that he does not use illicit drugs.  No family history on file.  Medications: Patient's Medications  New Prescriptions   No medications on file  Previous Medications   ACETAMINOPHEN (TYLENOL) 325 MG TABLET    Take 2 tablets (650 mg total) by mouth 3 (three) times daily.   ATORVASTATIN (LIPITOR) 10 MG TABLET    Take 10 mg by mouth daily.   DIVALPROEX (DEPAKOTE SPRINKLES) 125 MG CAPSULE    Take 4 capsules (500 mg total) by mouth 2 (two) times daily.   DONEPEZIL (ARICEPT) 10 MG TABLET    Take 10 mg by mouth daily.   FLUTICASONE (VERAMYST) 27.5 MCG/SPRAY NASAL SPRAY    Place 2 sprays into the nose daily.   FUROSEMIDE (LASIX) 40 MG TABLET    Take 1 tablet (40 mg total) by mouth daily.   IPRATROPIUM-ALBUTEROL (DUONEB) 0.5-2.5 (3) MG/3ML SOLN    Take 3 mLs by nebulization every 6 (six) hours.    LORATADINE (CLARITIN) 10 MG TABLET    Take 10 mg by mouth daily.  LORAZEPAM (ATIVAN) 0.5 MG TABLET    Take 0.5 mg by mouth daily. DailyAfter dinner. May have additional .5mg  daily as needed for anxiety   MEMANTINE HCL ER (NAMENDA XR) 28 MG CP24    Take 1 capsule by mouth daily.   NITROGLYCERIN (NITROSTAT) 0.4 MG SL TABLET    Place 0.4 mg under the tongue every 5 (five) minutes as needed for chest pain.   OMEPRAZOLE (PRILOSEC) 20 MG CAPSULE    Take 20 mg by mouth daily.   QUETIAPINE (SEROQUEL) 25 MG TABLET    Take 25 mg by mouth at bedtime.   RISPERIDONE (RISPERDAL) 0.5 MG TABLET    Take 0.5 mg by mouth 2 (two) times daily.   SPIRONOLACTONE (ALDACTONE) 25 MG TABLET    Take 1 tablet (25 mg total) by mouth daily.   TAMSULOSIN (FLOMAX) 0.4 MG CAPS     Take 0.4 mg by mouth daily after breakfast.    VITAMIN B-12 (CYANOCOBALAMIN) 1000 MCG TABLET    Take 1,000 mcg by mouth daily.   WARFARIN (COUMADIN) 5 MG TABLET    Take 5 mg by mouth as directed. Give 5mg  daily or as directed  Modified Medications   No medications on file  Discontinued Medications   No medications on file     Physical Exam:  Filed Vitals:   01/04/14 1031  BP: 145/72  Pulse: 90  Temp: 97.4 F (36.3 C)  Resp: 20   GENERAL APPEARANCE: No acute distress, appropriately groomed, normal body habitus. Alert, pleasant SKIN: No diaphoresis, rash  HEAD: Normocephalic, atraumatic  EYES: Conjunctiva/lids clear.  MOUTH/THROAT: Lips w/o lesions. Oral mucosa, tongue moist, w/o lesion.  RESPIRATORY: Breathing is even, unlabored. Lung sounds are clear and full.  CARDIOVASCULAR: Heart IRRR. No murmur or extra heart sounds  EDEMA: Trace ankle edema bilateral  GASTROINTESTINAL: Abdomen is soft, non-tender, not distended w/ normal bowel sounds.  MUSCULOSKELETAL: . Gait is steady with walker  NEUROLOGIC: Not oriented to time, place (dementia) no tremor. Marland Kitchen  PSYCHIATRIC: Mood and affect appropriate to situation     Assessment/Plan 1. Alzheimer's dementia with behavioral disturbance -has done well with addition of seroquel and risperdal, will monitor closely for adverse effects of medications, staff aware to notify for gait disturbance, increase lethargy or other changes in behavior -will cont current medications at this time.   2. Acute psychosis -has improved, still with some behaviors but overall better. Pt still has PRN ativan if needed for worsening anxiety or agitation.

## 2014-01-06 ENCOUNTER — Encounter: Payer: Self-pay | Admitting: Internal Medicine

## 2014-01-06 ENCOUNTER — Non-Acute Institutional Stay (SKILLED_NURSING_FACILITY): Payer: Medicare Other | Admitting: Internal Medicine

## 2014-01-06 DIAGNOSIS — F0391 Unspecified dementia with behavioral disturbance: Secondary | ICD-10-CM | POA: Diagnosis not present

## 2014-01-06 DIAGNOSIS — G309 Alzheimer's disease, unspecified: Secondary | ICD-10-CM | POA: Diagnosis not present

## 2014-01-06 DIAGNOSIS — R269 Unspecified abnormalities of gait and mobility: Secondary | ICD-10-CM

## 2014-01-06 DIAGNOSIS — F0280 Dementia in other diseases classified elsewhere without behavioral disturbance: Secondary | ICD-10-CM

## 2014-01-06 DIAGNOSIS — F03918 Unspecified dementia, unspecified severity, with other behavioral disturbance: Secondary | ICD-10-CM | POA: Diagnosis not present

## 2014-01-06 NOTE — Progress Notes (Signed)
Patient ID: Bryan [name], male   DOB: June 28, 1923, 80 y.o.   MRN: [id number]    Location:  [place] of Service: SNF (31)    Allergies  Allergen Reactions  . Penicillins Rash  . Bee Venom     Chief Complaint  Patient presents with  . Acute Visit    Difficulty walking    HPI:  Dementia with behavioral disturbance: Patient has been seen by Dr. Casimiro Needle, who started Depakote for his behavioral disturbances. This patient has been aggressive and he cannot be redirected. He has been striking out at the staff. The Depakote did not work. Dr. Bubba Camp started Risperdal 0.5 mg twice daily and Seroquel 25 mg nightly on 12/29/13. Since these medications were started, he has been having increasing problems with walking.  Alzheimer's disease: Unchanged  Gait disorder: Patient has a history of gait disorder, but it has become much more difficult for him to walk steadily since being put on the antipsychotic medications noted above.    Medications: Patient's Medications  New Prescriptions   No medications on file  Previous Medications   ACETAMINOPHEN (TYLENOL) 325 MG TABLET    Take 2 tablets (650 mg total) by mouth 3 (three) times daily.   ATORVASTATIN (LIPITOR) 10 MG TABLET    Take 10 mg by mouth daily.   DIVALPROEX (DEPAKOTE SPRINKLES) 125 MG CAPSULE    Take 4 capsules (500 mg total) by mouth 2 (two) times daily.   DONEPEZIL (ARICEPT) 10 MG TABLET    Take 10 mg by mouth daily.   FLUTICASONE (VERAMYST) 27.5 MCG/SPRAY NASAL SPRAY    Place 2 sprays into the nose daily.   FUROSEMIDE (LASIX) 40 MG TABLET    Take 1 tablet (40 mg total) by mouth daily.   IPRATROPIUM-ALBUTEROL (DUONEB) 0.5-2.5 (3) MG/3ML SOLN    Take 3 mLs by nebulization every 6 (six) hours.    LORATADINE (CLARITIN) 10 MG TABLET    Take 10 mg by mouth daily.   LORAZEPAM (ATIVAN) 0.5 MG TABLET    Take 0.5 mg by mouth daily. DailyAfter dinner. May have additional .5mg  daily as needed for anxiety   MEMANTINE HCL ER (NAMENDA XR) 28 MG CP24    Take 1 capsule by mouth daily.   NITROGLYCERIN (NITROSTAT) 0.4 MG SL TABLET    Place 0.4 mg under the tongue every 5 (five) minutes as needed for chest pain.   OMEPRAZOLE (PRILOSEC) 20 MG CAPSULE    Take 20 mg by mouth daily.   QUETIAPINE (SEROQUEL) 25 MG TABLET    Take 25 mg by mouth at bedtime.   RISPERIDONE (RISPERDAL) 0.5 MG TABLET    Take 0.5 mg by mouth 2 (two) times daily.   SPIRONOLACTONE (ALDACTONE) 25 MG TABLET    Take 1 tablet (25 mg total) by mouth daily.   TAMSULOSIN (FLOMAX) 0.4 MG CAPS    Take 0.4 mg by mouth daily after breakfast.    VITAMIN B-12 (CYANOCOBALAMIN) 1000 MCG TABLET    Take 1,000 mcg by mouth daily.   WARFARIN (COUMADIN) 5 MG TABLET    Take 5 mg by mouth as directed. Give 5mg  daily or as directed  Modified Medications   No medications on file  Discontinued Medications   No medications on file     Review of Systems  Constitutional: Positive for activity change and fatigue.       Overweight  HENT: Negative.   Eyes: Negative.   Respiratory: Negative.   Cardiovascular: Positive for  leg swelling. Negative for chest pain and palpitations.  Gastrointestinal: Negative.   Endocrine: Negative.   Genitourinary:       Incontinence  Musculoskeletal: Negative for arthralgias, back pain and neck pain.       Unstable gait  Skin: Negative.   Allergic/Immunologic: Negative.   Neurological: Positive for weakness. Negative for dizziness, tremors and seizures.       Severe dementia of the Alzheimer's type  Hematological: Negative.   Psychiatric/Behavioral: Positive for behavioral problems, confusion and agitation. The patient is nervous/anxious.     Filed Vitals:   01/06/14 1718  BP: 134/66  Pulse: 69  Temp: 96.7 F (35.9 C)  Resp: 14  Height: 6' (1.829 m)  Weight: 228 lb (103.42 kg)  SpO2: 95%   Body mass index is 30.92 kg/(m^2).  Physical Exam  Constitutional:  Overweight  HENT:  Head: Normocephalic and  atraumatic.  Nose: Nose normal.  Mouth/Throat: Oropharynx is clear and moist. No oropharyngeal exudate.  Eyes: Conjunctivae and EOM are normal. Pupils are equal, round, and reactive to light.  Neck: No JVD present. No tracheal deviation present. No thyromegaly present.  Cardiovascular: Normal rate, regular rhythm, normal heart sounds and intact distal pulses.  Exam reveals no gallop and no friction rub.   No murmur heard. Pulmonary/Chest: No respiratory distress. He has no wheezes. He has no rales. He exhibits no tenderness.  Abdominal: He exhibits no distension and no mass. There is no tenderness.  Musculoskeletal: Normal range of motion. He exhibits edema. He exhibits no tenderness.  Unstable on standing and walking.  Lymphadenopathy:    He has no cervical adenopathy.  Neurological: He is alert. No cranial nerve deficit. Coordination abnormal.  Skin: No rash noted. No erythema. No pallor.  Psychiatric:  Episodic agitation and aggressive behavior striking out at staff. Difficult to redirect patient when he is in this mood.     Labs reviewed: Nursing Home on 12/14/2013  Component Date Value Ref Range Status  . INR 11/09/2013 3.1* 0.9 - 1.1 Final  . Glucose 11/09/2013 94   Final  . BUN 11/09/2013 31* 4 - 21 mg/dL Final  . Creatinine 11/09/2013 1.2  0.6 - 1.3 mg/dL Final  . Potassium 11/09/2013 4.1  3.4 - 5.3 mmol/L Final  . Sodium 11/09/2013 138  137 - 147 mmol/L Final  . INR 11/23/2013 2.5* 0.9 - 1.1 Final  Nursing Home on 11/02/2013  Component Date Value Ref Range Status  . INR 10/19/2013 2.8* 0.9 - 1.1 Final  . Hemoglobin 08/10/2013 13.7  13.5 - 17.5 g/dL Final  . HCT 08/10/2013 41  41 - 53 % Final  . Platelets 08/10/2013 224  150 - 399 K/L Final  . WBC 08/10/2013 6.3   Final      Assessment/Plan  1. Dementia with behavioral disturbance Discontinue risperidone. For the time being he will continue Seroquel 25 mg at bed, Depakote 250 mg twice daily. His walking has not  improved in the next week, consider reducing or discontinuation of those medications.  2. Alzheimer's disease Unchanged  3. Gait disorder : He is a chronic problem with gait disturbance, I believe the recent difficulties are related to the use of the antipsychotic drugs and. We will discontinue risperidone and reevaluate next week.

## 2014-01-11 DIAGNOSIS — Z79899 Other long term (current) drug therapy: Secondary | ICD-10-CM | POA: Diagnosis not present

## 2014-01-11 DIAGNOSIS — Z7901 Long term (current) use of anticoagulants: Secondary | ICD-10-CM | POA: Diagnosis not present

## 2014-01-18 DIAGNOSIS — Z7901 Long term (current) use of anticoagulants: Secondary | ICD-10-CM | POA: Diagnosis not present

## 2014-01-18 DIAGNOSIS — Z79899 Other long term (current) drug therapy: Secondary | ICD-10-CM | POA: Diagnosis not present

## 2014-01-25 DIAGNOSIS — Z7901 Long term (current) use of anticoagulants: Secondary | ICD-10-CM | POA: Diagnosis not present

## 2014-01-25 DIAGNOSIS — Z79899 Other long term (current) drug therapy: Secondary | ICD-10-CM | POA: Diagnosis not present

## 2014-01-25 LAB — LIPID PANEL
Cholesterol: 117 mg/dL (ref 0–200)
HDL: 41 mg/dL (ref 35–70)
LDL Cholesterol: 51 mg/dL
Triglycerides: 126 mg/dL (ref 40–160)

## 2014-01-25 LAB — POCT INR: INR: 1.5 — AB (ref 0.9–1.1)

## 2014-01-27 DIAGNOSIS — F0281 Dementia in other diseases classified elsewhere with behavioral disturbance: Secondary | ICD-10-CM | POA: Diagnosis not present

## 2014-01-27 DIAGNOSIS — F02818 Dementia in other diseases classified elsewhere, unspecified severity, with other behavioral disturbance: Secondary | ICD-10-CM | POA: Diagnosis not present

## 2014-02-01 DIAGNOSIS — Z7901 Long term (current) use of anticoagulants: Secondary | ICD-10-CM | POA: Diagnosis not present

## 2014-02-02 DIAGNOSIS — Z85828 Personal history of other malignant neoplasm of skin: Secondary | ICD-10-CM | POA: Diagnosis not present

## 2014-02-08 ENCOUNTER — Non-Acute Institutional Stay (SKILLED_NURSING_FACILITY): Payer: Medicare Other | Admitting: Nurse Practitioner

## 2014-02-08 DIAGNOSIS — I482 Chronic atrial fibrillation, unspecified: Secondary | ICD-10-CM

## 2014-02-08 DIAGNOSIS — F03918 Unspecified dementia, unspecified severity, with other behavioral disturbance: Secondary | ICD-10-CM

## 2014-02-08 DIAGNOSIS — Z7901 Long term (current) use of anticoagulants: Secondary | ICD-10-CM | POA: Diagnosis not present

## 2014-02-08 DIAGNOSIS — I4891 Unspecified atrial fibrillation: Secondary | ICD-10-CM

## 2014-02-08 DIAGNOSIS — R609 Edema, unspecified: Secondary | ICD-10-CM | POA: Diagnosis not present

## 2014-02-08 DIAGNOSIS — R600 Localized edema: Secondary | ICD-10-CM

## 2014-02-08 DIAGNOSIS — F0391 Unspecified dementia with behavioral disturbance: Secondary | ICD-10-CM | POA: Diagnosis not present

## 2014-02-08 DIAGNOSIS — L723 Sebaceous cyst: Secondary | ICD-10-CM

## 2014-02-08 NOTE — Progress Notes (Signed)
Patient ID: Bryan [name], male   DOB: [birth year], 80 y.o.   MRN: [id number]   [facility] SNF [phone])  Code Status: Full Code  Contact Information   Name Relation Home Work [place] Spouse [phone]     Khrystopher, Mcalpine Daughter [phone]     [name],[name] Daughter [phone]     Harvinder, Kozub [phone]        Chief Complaint  Patient presents with  . Medical Management of Chronic Issues    HPI: This is a 80 y.o. male resident of [facility], Memory Care Section  evaluated today for management of ongoing medical issues.  In the last month pt was seen for increased behaviors, place on Risperdal and sequel but ended up being taking of Risperdal due to gait abnormalities. conts to take Seroquel qhs tolerating well. Behaviors are less frequent but still present. Family noted cyst on right upper back, pt does not report that it bothers him. Pts functional status has been stable since off Risperdal. INR has been stable over the past 2 weeks on 5 mg of coumadin, no signs of bleeding.    Allergies  Allergen Reactions  . Penicillins Rash  . Bee Venom   . Risperidone And Related     Unstable gait    MEDICATIONS    Medication List       This list is accurate as of: 02/08/14 11:13 AM.  Always use your most recent med list.               acetaminophen 325 MG tablet  Commonly known as:  TYLENOL  Take 2 tablets (650 mg total) by mouth 3 (three) times daily.     atorvastatin 10 MG tablet  Commonly known as:  LIPITOR  Take 10 mg by mouth daily.     divalproex 125 MG capsule  Commonly known as:  DEPAKOTE SPRINKLE  Take 250 mg by mouth 2 (two) times daily.     donepezil 10 MG tablet  Commonly known as:  ARICEPT  Take 10 mg by mouth daily.     fluticasone 27.5 MCG/SPRAY nasal spray  Commonly known as:  VERAMYST  Place 2 sprays into the nose daily.     furosemide 40 MG tablet  Commonly known as:   LASIX  Take 1 tablet (40 mg total) by mouth daily.     ipratropium-albuterol 0.5-2.5 (3) MG/3ML Soln  Commonly known as:  DUONEB  Take 3 mLs by nebulization every 6 (six) hours.     loratadine 10 MG tablet  Commonly known as:  CLARITIN  Take 10 mg by mouth daily.     LORazepam 0.5 MG tablet  Commonly known as:  ATIVAN  Take 0.5 mg by mouth daily. DailyAfter dinner. May have additional .5mg  daily as needed for anxiety     NAMENDA XR 28 MG Cp24  Generic drug:  Memantine HCl ER  Take 1 capsule by mouth daily.     nitroGLYCERIN 0.4 MG SL tablet  Commonly known as:  NITROSTAT  Place 0.4 mg under the tongue every 5 (five) minutes as needed for chest pain.     omeprazole 20 MG capsule  Commonly known as:  PRILOSEC  Take 20 mg by mouth daily.     QUEtiapine 25 MG tablet  Commonly known as:  SEROQUEL  Take 25 mg by mouth at bedtime.     spironolactone 25 MG tablet  Commonly known as:  ALDACTONE  Take 1 tablet (25 mg  total) by mouth daily.     tamsulosin 0.4 MG Caps capsule  Commonly known as:  FLOMAX  Take 0.4 mg by mouth daily after breakfast.     vitamin B-12 1000 MCG tablet  Commonly known as:  CYANOCOBALAMIN  Take 1,000 mcg by mouth daily.     warfarin 5 MG tablet  Commonly known as:  COUMADIN  Take 5 mg by mouth as directed. Give 5mg  daily or as directed       REVIEW OF SYSTEMS  Unable to obtain    DATA REVIEWED Radiologic Exams  Quality mobile x-ray 09/13/2012 chest x-ray: Unchanged minimal cardiomegaly. Prominent pulmonary vascular chair suggestive of mild congestive changes. No focal pulmonary consolidation or pleural effusions identified  Cardiovascular Exams 09/20/13 2D Echocardiogram; Technically difficult study, unable to estimate LVEF, appears grossly WNL. LA mildly enlarged, RA moderately enlarged.   Laboratory Studies:  Labs reviewed: Basic Metabolic Panel:  Recent Labs  08/12/13 09/21/13 11/09/13  NA 139 140 138  K 3.9 3.8 4.1  BUN 29* 27*  31*  CREATININE 0.9 1.0 1.2   CBC:  Recent Labs  08/10/13  WBC 6.3  HGB 13.7  HCT 41  PLT 224  Lipid Panel:  Recent Labs  01/25/14  CHOL 117  HDL 41  LDLCALC 51  TRIG 126    Lab Results  Component Value Date   INR 1.5* 01/25/2014   INR 2.5* 11/23/2013   INR 3.1* 11/09/2013      PHYSICAL EXAM Filed Vitals:   02/08/14 1043  BP: 121/74  Pulse: 72  Temp: 98.4 F (36.9 C)  Resp: 18  Weight: 226 lb (102.513 kg)   Body mass index is 30.64 kg/(m^2).  GENERAL APPEARANCE: No acute distress, appropriately groomed, normal body habitus. Alert, pleasant,interactive, conversation is difficult due to language deficit (nonsensical speech).  Follows simple directions SKIN: No diaphoresis, rash, sebaceous cyst noted to right upper back, no signs of infection noted  HEAD: Normocephalic, atraumatic EYES: Conjunctiva/lids clear.  MOUTH/THROAT: Lips w/o lesions. Oral mucosa, tongue moist, w/o lesion.  RESPIRATORY: Breathing is even, unlabored. Lung sounds are CTA CARDIOVASCULAR: Heart IRRR. No murmur or extra heart sounds   EDEMA: Trace ankle edema bilateral  GASTROINTESTINAL: Abdomen is soft, non-tender, not distended w/ normal bowel sounds. MUSCULOSKELETAL: shuffling Gait with walker NEUROLOGIC: Not oriented to time, place (dementia).. PSYCHIATRIC: Mood and affect appropriate to situation   ASSESSMENT/PLAN  1. Dementia with behavioral disturbance -has been stable in the last month. No changes in function or cognition. Will cont current medications  2. Edema of both legs Stable, will follow up bmp  3. Long term (current) use of anticoagulants -INR 2.74 today, stable over the last 2 weeks. Will cont current 5 mg daily dose and recheck 1 week.   4. Chronic atrial fibrillation -rate controlled, cont medication including anticoagulation   5. Sebaceous cyst -noninfected, will have staff apply warm compresses TID and monitor

## 2014-02-15 DIAGNOSIS — Z7901 Long term (current) use of anticoagulants: Secondary | ICD-10-CM | POA: Diagnosis not present

## 2014-02-15 DIAGNOSIS — F0280 Dementia in other diseases classified elsewhere without behavioral disturbance: Secondary | ICD-10-CM | POA: Diagnosis not present

## 2014-02-15 DIAGNOSIS — Z79899 Other long term (current) drug therapy: Secondary | ICD-10-CM | POA: Diagnosis not present

## 2014-02-22 DIAGNOSIS — Z79899 Other long term (current) drug therapy: Secondary | ICD-10-CM | POA: Diagnosis not present

## 2014-02-22 DIAGNOSIS — F0280 Dementia in other diseases classified elsewhere without behavioral disturbance: Secondary | ICD-10-CM | POA: Diagnosis not present

## 2014-02-22 DIAGNOSIS — Z7901 Long term (current) use of anticoagulants: Secondary | ICD-10-CM | POA: Diagnosis not present

## 2014-02-22 LAB — CBC AND DIFFERENTIAL
HEMATOCRIT: 40 % — AB (ref 41–53)
Hemoglobin: 13.5 g/dL (ref 13.5–17.5)
PLATELETS: 240 10*3/uL (ref 150–399)
WBC: 7.2 10^3/mL

## 2014-03-01 DIAGNOSIS — Z7901 Long term (current) use of anticoagulants: Secondary | ICD-10-CM | POA: Diagnosis not present

## 2014-03-04 DIAGNOSIS — L578 Other skin changes due to chronic exposure to nonionizing radiation: Secondary | ICD-10-CM | POA: Diagnosis not present

## 2014-03-04 DIAGNOSIS — D235 Other benign neoplasm of skin of trunk: Secondary | ICD-10-CM | POA: Diagnosis not present

## 2014-03-04 DIAGNOSIS — L219 Seborrheic dermatitis, unspecified: Secondary | ICD-10-CM | POA: Diagnosis not present

## 2014-03-08 DIAGNOSIS — Z7901 Long term (current) use of anticoagulants: Secondary | ICD-10-CM | POA: Diagnosis not present

## 2014-03-17 DIAGNOSIS — F0281 Dementia in other diseases classified elsewhere with behavioral disturbance: Secondary | ICD-10-CM | POA: Diagnosis not present

## 2014-03-17 DIAGNOSIS — F02818 Dementia in other diseases classified elsewhere, unspecified severity, with other behavioral disturbance: Secondary | ICD-10-CM | POA: Diagnosis not present

## 2014-03-22 DIAGNOSIS — R609 Edema, unspecified: Secondary | ICD-10-CM | POA: Diagnosis not present

## 2014-03-22 DIAGNOSIS — Z79899 Other long term (current) drug therapy: Secondary | ICD-10-CM | POA: Diagnosis not present

## 2014-03-22 LAB — BASIC METABOLIC PANEL
BUN: 32 mg/dL — AB (ref 4–21)
Creatinine: 1.3 mg/dL (ref 0.6–1.3)
GLUCOSE: 72 mg/dL
POTASSIUM: 4.4 mmol/L (ref 3.4–5.3)
Sodium: 140 mmol/L (ref 137–147)

## 2014-03-22 LAB — POCT INR: INR: 2.5 — AB (ref 0.9–1.1)

## 2014-03-25 ENCOUNTER — Encounter: Payer: Self-pay | Admitting: Internal Medicine

## 2014-03-29 DIAGNOSIS — R609 Edema, unspecified: Secondary | ICD-10-CM | POA: Diagnosis not present

## 2014-04-06 DIAGNOSIS — R413 Other amnesia: Secondary | ICD-10-CM | POA: Diagnosis not present

## 2014-04-06 DIAGNOSIS — R609 Edema, unspecified: Secondary | ICD-10-CM | POA: Diagnosis not present

## 2014-04-06 DIAGNOSIS — F03918 Unspecified dementia, unspecified severity, with other behavioral disturbance: Secondary | ICD-10-CM | POA: Diagnosis not present

## 2014-04-06 DIAGNOSIS — R269 Unspecified abnormalities of gait and mobility: Secondary | ICD-10-CM | POA: Diagnosis not present

## 2014-04-06 DIAGNOSIS — M6281 Muscle weakness (generalized): Secondary | ICD-10-CM | POA: Diagnosis not present

## 2014-04-06 DIAGNOSIS — F0391 Unspecified dementia with behavioral disturbance: Secondary | ICD-10-CM | POA: Diagnosis not present

## 2014-04-07 DIAGNOSIS — R600 Localized edema: Secondary | ICD-10-CM | POA: Diagnosis not present

## 2014-04-07 DIAGNOSIS — R412 Retrograde amnesia: Secondary | ICD-10-CM | POA: Diagnosis not present

## 2014-04-07 DIAGNOSIS — R269 Unspecified abnormalities of gait and mobility: Secondary | ICD-10-CM | POA: Diagnosis not present

## 2014-04-07 DIAGNOSIS — R2689 Other abnormalities of gait and mobility: Secondary | ICD-10-CM | POA: Diagnosis not present

## 2014-04-07 DIAGNOSIS — F0391 Unspecified dementia with behavioral disturbance: Secondary | ICD-10-CM | POA: Diagnosis not present

## 2014-04-07 DIAGNOSIS — M6281 Muscle weakness (generalized): Secondary | ICD-10-CM | POA: Diagnosis not present

## 2014-04-12 DIAGNOSIS — Z23 Encounter for immunization: Secondary | ICD-10-CM | POA: Diagnosis not present

## 2014-04-21 DIAGNOSIS — Z79899 Other long term (current) drug therapy: Secondary | ICD-10-CM | POA: Diagnosis not present

## 2014-04-21 DIAGNOSIS — E538 Deficiency of other specified B group vitamins: Secondary | ICD-10-CM | POA: Diagnosis not present

## 2014-04-26 ENCOUNTER — Non-Acute Institutional Stay (SKILLED_NURSING_FACILITY): Payer: Medicare Other | Admitting: Nurse Practitioner

## 2014-04-26 DIAGNOSIS — F03918 Unspecified dementia, unspecified severity, with other behavioral disturbance: Secondary | ICD-10-CM

## 2014-04-26 DIAGNOSIS — R600 Localized edema: Secondary | ICD-10-CM

## 2014-04-26 DIAGNOSIS — Z7901 Long term (current) use of anticoagulants: Secondary | ICD-10-CM | POA: Diagnosis not present

## 2014-04-26 DIAGNOSIS — N400 Enlarged prostate without lower urinary tract symptoms: Secondary | ICD-10-CM | POA: Diagnosis not present

## 2014-04-26 DIAGNOSIS — F0391 Unspecified dementia with behavioral disturbance: Secondary | ICD-10-CM

## 2014-04-26 DIAGNOSIS — I482 Chronic atrial fibrillation, unspecified: Secondary | ICD-10-CM

## 2014-04-26 DIAGNOSIS — D649 Anemia, unspecified: Secondary | ICD-10-CM | POA: Diagnosis not present

## 2014-04-26 DIAGNOSIS — R609 Edema, unspecified: Secondary | ICD-10-CM | POA: Diagnosis not present

## 2014-04-26 DIAGNOSIS — R269 Unspecified abnormalities of gait and mobility: Secondary | ICD-10-CM

## 2014-04-26 DIAGNOSIS — E538 Deficiency of other specified B group vitamins: Secondary | ICD-10-CM | POA: Diagnosis not present

## 2014-04-26 LAB — BASIC METABOLIC PANEL
BUN: 43 mg/dL — AB (ref 4–21)
Creatinine: 1.6 mg/dL — AB (ref 0.6–1.3)
GLUCOSE: 78 mg/dL
POTASSIUM: 4.3 mmol/L (ref 3.4–5.3)
SODIUM: 143 mmol/L (ref 137–147)

## 2014-04-26 NOTE — Progress Notes (Signed)
Patient ID: Bryan [name], male   DOB: 1922-08-03, 80 y.o.   MRN: [id number]    Nursing Home Location:  [place] of Service: SNF (31)  PCP: Estill Dooms, MD  Allergies  Allergen Reactions  . Penicillins Rash  . Bee Venom   . Risperidone And Related     Unstable gait    Chief Complaint  Patient presents with  . Medical Management of Chronic Issues    HPI:  This is a 80 y.o. male resident of [facility], Memory Care Section evaluated today for management of ongoing medical issues. In the last month pt has had issues with increase sleepiness with ativan being changes to AM dosing however staff reports more resistant and combative with care. Dose was divided up to ativan 0.25 mg BID and pt has tolerated well. Pt also on Seroquel 50 mg daily in the AM.  Pt currently on 4 mg coumadin, dose was reduced last week after INR was 3.5. No signs of bleeding or bruising and pt has been taking medications Pt functional status has showed some slow decline over the past month. Staff using lift for transferred due to pts behaviors to keep staff safe therefore pt has not been walking as frequently. Needing more assistance with meals.   Review of Systems:  Review of Systems  Unable to perform ROS: Dementia    Past Medical History  Diagnosis Date  . Hyperlipidemia   . Alzheimer's disease 2013  . GERD (gastroesophageal reflux disease)   . BPH (benign prostatic hyperplasia)   . Atrial fibrillation     Long term anti coagulation w/ warfarin fro stroke risk reduction  . 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 Lactose intolerance   . Allergic rhinitis   . Long term (current) use of anticoagulants 11/23/2012    Long-term anticoagulation with warfarin for stroke risk reduction related to AF  . Gait disorder   . Chronic venous insufficiency 01/12/2013  . Dementia with behavioral disturbance 12/29/13   Past Surgical History    Procedure Laterality Date  . Cataract extraction w/ intraocular lens  implant, bilateral    . Hernia repair Right 1970s  . Retinal detachment surgery Right 2009  . Knee surgery Right 2000  . Mass excision Left 07/30/2013    Procedure: EXCISION OF BASAL CELL CARCINOMA  LEFT FOREHEAD ;  Surgeon: Irene Limbo, MD;  Location: [place];  Service: Plastics;  Laterality: Left;   Social History:   reports that he quit smoking about 30 years ago. He quit smokeless tobacco use about 43 years ago. He reports that he drinks alcohol. He reports that he does not use illicit drugs.  No family history on file.  Medications: Patient's Medications  New Prescriptions   No medications on file  Previous Medications   ACETAMINOPHEN (TYLENOL) 325 MG TABLET    Take 2 tablets (650 mg total) by mouth 3 (three) times daily.   ATORVASTATIN (LIPITOR) 10 MG TABLET    Take 10 mg by mouth daily.   DIVALPROEX (DEPAKOTE SPRINKLE) 125 MG CAPSULE    Take 250 mg by mouth 2 (two) times daily.   DONEPEZIL (ARICEPT) 10 MG TABLET    Take 10 mg by mouth daily.   FLUTICASONE (VERAMYST) 27.5 MCG/SPRAY NASAL SPRAY    Place 2 sprays into the nose daily.   FUROSEMIDE (LASIX) 40 MG TABLET    Take 80 mg by mouth. Take one tablet  daily for edema   KETOCONAZOLE (NIZORAL) 2 % SHAMPOO    Apply 1 application topically. Wash head, face, ears, three times a week, Mon, Thur, Sat   LORATADINE (CLARITIN) 10 MG TABLET    Take 10 mg by mouth daily.   LORAZEPAM (ATIVAN) 0.5 MG TABLET    Take 0.25 mg by mouth 2 (two) times daily.    MEMANTINE HCL ER (NAMENDA XR) 28 MG CP24    Take 1 capsule by mouth daily.   NITROGLYCERIN (NITROSTAT) 0.4 MG SL TABLET    Place 0.4 mg under the tongue every 5 (five) minutes as needed for chest pain.   OMEPRAZOLE (PRILOSEC) 20 MG CAPSULE    Take 20 mg by mouth daily.   QUETIAPINE (SEROQUEL) 25 MG TABLET    Take 50 mg by mouth every morning.    SPIRONOLACTONE (ALDACTONE) 25 MG TABLET    Take 1  tablet (25 mg total) by mouth daily.   TAMSULOSIN (FLOMAX) 0.4 MG CAPS    Take 0.4 mg by mouth daily after breakfast.    VITAMIN B-12 (CYANOCOBALAMIN) 1000 MCG TABLET    Take 1,000 mcg by mouth daily.   WARFARIN (COUMADIN) 4 MG TABLET    Take 4 mg by mouth daily.   Modified Medications   No medications on file  Discontinued Medications   WARFARIN (COUMADIN) 5 MG TABLET    Take 5 mg by mouth as directed. Give 5mg  daily Sun, Tue, Thu, Sat     Physical Exam: Danley Danker Vitals:   04/26/14 1034  BP: 111/69  Pulse: 72  Temp: 97.9 F (36.6 C)  Resp: 20  Weight: 239 lb (108.41 kg)    Physical Exam  Constitutional: He appears well-developed and well-nourished. No distress.  Overweight  HENT:  Head: Normocephalic and atraumatic.  Nose: Nose normal.  Mouth/Throat: Oropharynx is clear and moist. No oropharyngeal exudate.  Eyes: Conjunctivae and EOM are normal. Pupils are equal, round, and reactive to light.  Neck: Normal range of motion. Neck supple. No JVD present.  Cardiovascular: Normal rate, regular rhythm and normal heart sounds.   Pulmonary/Chest: Effort normal and breath sounds normal. No respiratory distress.  Abdominal: Soft. Bowel sounds are normal. He exhibits no distension. There is no tenderness.  Musculoskeletal: He exhibits edema (compression hose on). He exhibits no tenderness.  Unstable on standing and walking-- staff using lift   Neurological: He is alert. No cranial nerve deficit. Coordination abnormal.  Skin: Skin is warm and dry. He is not diaphoretic.  Psychiatric:  Episodic agitation and aggressive behavior with personal care and transfers     Labs reviewed: Basic Metabolic Panel:  Recent Labs  09/21/13 11/09/13 03/22/14  NA 140 138 140  K 3.8 4.1 4.4  BUN 27* 31* 32*  CREATININE 1.0 1.2 1.3   Liver Function Tests: No results found for this basename: AST, ALT, ALKPHOS, BILITOT, PROT, ALBUMIN,  in the last 8760 hours No results found for this basename:  LIPASE, AMYLASE,  in the last 8760 hours No results found for this basename: AMMONIA,  in the last 8760 hours CBC:  Recent Labs  08/10/13  WBC 6.3  HGB 13.7  HCT 41  PLT 224   TSH: No results found for this basename: TSH,  in the last 8760 hours A1C: No results found for this basename: HGBA1C   Lipid Panel:  Recent Labs  01/25/14  CHOL 117  HDL 41  LDLCALC 51  TRIG 126    Assessment/Plan 1. Dementia with behavioral  disturbance -advanced with behaviors which have not worsen but still not controlled. Staff works with pt frequently to optimize care, also has care sitter from 7-3.  -increase lethargy per sitter, staff unaware if increase in seroquel provided any benefit. Will reduce dose and change to qhs.  2. Chronic atrial fibrillation -rate controlled, cont on current medications as well as anticoagulant   3. BPH (benign prostatic hyperplasia) conts on flomax  4. Edema of both legs -some improvement with increase lasix, conts on lasix and aldactone  -will follow up bmp  5. Long term current use of anticoagulant therapy -will have staff get INR today  6. Gait disorder -worsening due to progressive dementia and behaviors make it more difficult to work with pt to walk also with increase sleepiness and lack of motivation.   7. Allergic rhinitis -will dc Flonase (not allowing nurse to administer) and Claritin and monitor for symptoms

## 2014-05-03 DIAGNOSIS — Z7901 Long term (current) use of anticoagulants: Secondary | ICD-10-CM | POA: Diagnosis not present

## 2014-05-09 DIAGNOSIS — R2689 Other abnormalities of gait and mobility: Secondary | ICD-10-CM | POA: Diagnosis not present

## 2014-05-09 DIAGNOSIS — R600 Localized edema: Secondary | ICD-10-CM | POA: Diagnosis not present

## 2014-05-09 DIAGNOSIS — R269 Unspecified abnormalities of gait and mobility: Secondary | ICD-10-CM | POA: Diagnosis not present

## 2014-05-09 DIAGNOSIS — R412 Retrograde amnesia: Secondary | ICD-10-CM | POA: Diagnosis not present

## 2014-05-09 DIAGNOSIS — M6281 Muscle weakness (generalized): Secondary | ICD-10-CM | POA: Diagnosis not present

## 2014-05-09 DIAGNOSIS — F0391 Unspecified dementia with behavioral disturbance: Secondary | ICD-10-CM | POA: Diagnosis not present

## 2014-05-10 DIAGNOSIS — G309 Alzheimer's disease, unspecified: Secondary | ICD-10-CM | POA: Diagnosis not present

## 2014-05-10 DIAGNOSIS — R488 Other symbolic dysfunctions: Secondary | ICD-10-CM | POA: Diagnosis not present

## 2014-05-10 DIAGNOSIS — M6281 Muscle weakness (generalized): Secondary | ICD-10-CM | POA: Diagnosis not present

## 2014-05-10 DIAGNOSIS — R600 Localized edema: Secondary | ICD-10-CM | POA: Diagnosis not present

## 2014-05-10 DIAGNOSIS — R269 Unspecified abnormalities of gait and mobility: Secondary | ICD-10-CM | POA: Diagnosis not present

## 2014-05-10 DIAGNOSIS — F0391 Unspecified dementia with behavioral disturbance: Secondary | ICD-10-CM | POA: Diagnosis not present

## 2014-05-10 DIAGNOSIS — F0281 Dementia in other diseases classified elsewhere with behavioral disturbance: Secondary | ICD-10-CM | POA: Diagnosis not present

## 2014-05-10 DIAGNOSIS — R2689 Other abnormalities of gait and mobility: Secondary | ICD-10-CM | POA: Diagnosis not present

## 2014-05-10 DIAGNOSIS — R412 Retrograde amnesia: Secondary | ICD-10-CM | POA: Diagnosis not present

## 2014-05-10 DIAGNOSIS — R279 Unspecified lack of coordination: Secondary | ICD-10-CM | POA: Diagnosis not present

## 2014-05-17 DIAGNOSIS — Z7901 Long term (current) use of anticoagulants: Secondary | ICD-10-CM | POA: Diagnosis not present

## 2014-05-17 LAB — PROTIME-INR: PROTIME: 25.9 s — AB (ref 10.0–13.8)

## 2014-05-17 LAB — POCT INR: INR: 2.4 — AB (ref <=1.1)

## 2014-05-30 ENCOUNTER — Non-Acute Institutional Stay (SKILLED_NURSING_FACILITY): Payer: Medicare Other | Admitting: Adult Health

## 2014-05-30 ENCOUNTER — Encounter: Payer: Self-pay | Admitting: Adult Health

## 2014-05-30 DIAGNOSIS — N183 Chronic kidney disease, stage 3 unspecified: Secondary | ICD-10-CM

## 2014-05-30 DIAGNOSIS — M47814 Spondylosis without myelopathy or radiculopathy, thoracic region: Secondary | ICD-10-CM | POA: Diagnosis not present

## 2014-05-30 DIAGNOSIS — F03918 Unspecified dementia, unspecified severity, with other behavioral disturbance: Secondary | ICD-10-CM

## 2014-05-30 DIAGNOSIS — I482 Chronic atrial fibrillation, unspecified: Secondary | ICD-10-CM

## 2014-05-30 DIAGNOSIS — R600 Localized edema: Secondary | ICD-10-CM

## 2014-05-30 DIAGNOSIS — E785 Hyperlipidemia, unspecified: Secondary | ICD-10-CM | POA: Diagnosis not present

## 2014-05-30 DIAGNOSIS — N400 Enlarged prostate without lower urinary tract symptoms: Secondary | ICD-10-CM

## 2014-05-30 DIAGNOSIS — F0391 Unspecified dementia with behavioral disturbance: Secondary | ICD-10-CM

## 2014-05-30 NOTE — Progress Notes (Signed)
Patient ID: Bryan [name], male   DOB: March 22, 1923, 80 y.o.   MRN: [id number]  Nursing Home Location:  [place]: SNF [phone])  Chief Complaint  Patient presents with  . Medical Management of Chronic Issues    HPI:  80 y.o. male residing at [facility], memory care section. I am here to review his chronic medical issues.  VS have been stable over the past month. Weight has remained stable at  238 lbs. He has a long standing hx of progressive AD and currently the nurse reports that he can be combative and resistant to care. He is can be redirected by the nursing staff. He has increased edema per the staff in both legs and this increases his risk of falls and further complicates his mobility. He is currently on Seroquel, Namenda, Aricept, and Depakote.  Review of Systems:  Review of Systems  Unable to perform ROS: Dementia    Medications: Patient's Medications  New Prescriptions   No medications on file  Previous Medications   ACETAMINOPHEN (TYLENOL) 325 MG TABLET    Take 2 tablets (650 mg total) by mouth 3 (three) times daily.   ATORVASTATIN (LIPITOR) 10 MG TABLET    Take 10 mg by mouth daily.   DIVALPROEX (DEPAKOTE SPRINKLE) 125 MG CAPSULE    Take 250 mg by mouth 2 (two) times daily.   DONEPEZIL (ARICEPT) 10 MG TABLET    Take 10 mg by mouth daily.   FLUTICASONE (VERAMYST) 27.5 MCG/SPRAY NASAL SPRAY    Place 2 sprays into the nose daily.   FUROSEMIDE (LASIX) 40 MG TABLET    Take 80 mg by mouth. Take one tablet daily for edema   KETOCONAZOLE (NIZORAL) 2 % SHAMPOO    Apply 1 application topically. Wash head, face, ears, three times a week, Mon, Thur, Sat   LORATADINE (CLARITIN) 10 MG TABLET    Take 10 mg by mouth daily.   LORAZEPAM (ATIVAN) 0.5 MG TABLET    Take 0.25 mg by mouth 2 (two) times daily.    MEMANTINE HCL ER (NAMENDA XR) 28 MG CP24    Take 1 capsule by mouth daily.   NITROGLYCERIN (NITROSTAT) 0.4 MG SL TABLET     Place 0.4 mg under the tongue every 5 (five) minutes as needed for chest pain.   OMEPRAZOLE (PRILOSEC) 20 MG CAPSULE    Take 20 mg by mouth daily.   QUETIAPINE (SEROQUEL) 25 MG TABLET    Take 25 mg by mouth at bedtime.    SPIRONOLACTONE (ALDACTONE) 25 MG TABLET    Take 1 tablet (25 mg total) by mouth daily.   TAMSULOSIN (FLOMAX) 0.4 MG CAPS    Take 0.4 mg by mouth daily after breakfast.    VITAMIN B-12 (CYANOCOBALAMIN) 1000 MCG TABLET    Take 1,000 mcg by mouth daily.   WARFARIN (COUMADIN) 4 MG TABLET    Take 4 mg by mouth daily.   Modified Medications   No medications on file  Discontinued Medications   No medications on file     Physical Exam:  Filed Vitals:   05/30/14 1346  BP: 145/90  Pulse: 85  Temp: 97.5 F (36.4 C)  Resp: 20  Weight: 238 lb (107.956 kg)    Physical Exam  Constitutional: He is well-developed, well-nourished, and in no distress.  HENT:  Head: Normocephalic and atraumatic.  Eyes: Pupils are equal, round, and reactive to light.  Neck: No JVD present. No tracheal  deviation present. No thyromegaly present.  Cardiovascular: Normal rate.  An irregularly irregular rhythm present.  +3 BLE edema  Pulmonary/Chest: Effort normal and breath sounds normal. No respiratory distress. He has no wheezes.  Abdominal: Soft. Bowel sounds are normal. He exhibits no distension. There is no tenderness.  Musculoskeletal:  Decreased ROM to shoulders, hips, knees. Crepitus to his knees bilat, no swelling or tenderness.  Lymphadenopathy:    He has no cervical adenopathy.  Neurological: He is alert. No cranial nerve deficit.  Oriented to self, verbal but can not f/c well or answer q's  Skin: Skin is warm and dry.    Labs reviewed/Significant Diagnostic Results:  Basic Metabolic Panel:  Recent Labs  11/09/13 03/22/14 04/26/14  NA 138 140 143  K 4.1 4.4 4.3  BUN 31* 32* 43*  CREATININE 1.2 1.3 1.6*   Liver Function Tests: No results for input(s): AST, ALT, ALKPHOS,  BILITOT, PROT, ALBUMIN in the last 8760 hours. No results for input(s): LIPASE, AMYLASE in the last 8760 hours. No results for input(s): AMMONIA in the last 8760 hours. CBC:  Recent Labs  08/10/13 02/22/14  WBC 6.3 7.2  HGB 13.7 13.5  HCT 41 40*  PLT 224 240   CBG: No results for input(s): GLUCAP in the last 8760 hours. TSH: No results for input(s): TSH in the last 8760 hours. A1C: No results found for: HGBA1C Lipid Panel:  Recent Labs  01/25/14  CHOL 117  HDL 41  LDLCALC 51  TRIG 126    Assessment/Plan  Atrial fibrillation His rate is controlled without meds. Continue current dose of Coumadin, recheck INR with next lab draw. If he begins to fall frequently, would d/c  Edema of both legs There are no echo reports in the system. He has a hx of venous insuff. I have asked the staff to fit him for new TED hose. He is on aldactone and Lasix, continue these and check a TSH and BMP at the next lab draw  Dementia with behavioral disturbance Continue current meds and monitor behaviors. He is combative but the staff feels they are able to redirect him at this time.  Osteoarthritis of spine Also reports of knee pain as well. Continue current dose of scheduled Tylenol. We discussed that some of his combative behavior may be due to pain but its difficult to determine. I could not elicit pain during my exam.  BPH (benign prostatic hyperplasia) He continues on Flomax with out reports of urinary difficulties or UTI. The current medical regimen is effective;  continue present plan and medications.   Hyperlipidemia LDL 51 01/2014. Continue statin for risk reduction of another CVA. Recheck lipid panel next month  Chronic kidney disease Worsening with increase diuretic therapy. Recheck BMP and monitor pt.   Labs/tests ordered TSH, BMP  Cindi Carbon, ANP [facility] [phone]

## 2014-06-03 DIAGNOSIS — L578 Other skin changes due to chronic exposure to nonionizing radiation: Secondary | ICD-10-CM | POA: Diagnosis not present

## 2014-06-03 DIAGNOSIS — N183 Chronic kidney disease, stage 3 unspecified: Secondary | ICD-10-CM | POA: Insufficient documentation

## 2014-06-03 DIAGNOSIS — L570 Actinic keratosis: Secondary | ICD-10-CM | POA: Diagnosis not present

## 2014-06-03 DIAGNOSIS — E785 Hyperlipidemia, unspecified: Secondary | ICD-10-CM | POA: Insufficient documentation

## 2014-06-03 NOTE — Assessment & Plan Note (Signed)
He continues on Flomax with out reports of urinary difficulties or UTI. The current medical regimen is effective;  continue present plan and medications.

## 2014-06-03 NOTE — Assessment & Plan Note (Signed)
Also reports of knee pain as well. Continue current dose of scheduled Tylenol. We discussed that some of his combative behavior may be due to pain but its difficult to determine. I could not elicit pain during my exam.

## 2014-06-03 NOTE — Assessment & Plan Note (Signed)
There are no echo reports in the system. He has a hx of venous insuff. I have asked the staff to fit him for new TED hose. He is on aldactone and Lasix, continue these and check a TSH and BMP at the next lab draw

## 2014-06-03 NOTE — Assessment & Plan Note (Signed)
Worsening with increase diuretic therapy. Recheck BMP and monitor pt.

## 2014-06-03 NOTE — Assessment & Plan Note (Signed)
LDL 51 01/2014. Continue statin for risk reduction of another CVA. Recheck lipid panel next month

## 2014-06-03 NOTE — Assessment & Plan Note (Addendum)
His rate is controlled without meds. Continue current dose of Coumadin, recheck INR with next lab draw. If he begins to fall frequently, would d/c

## 2014-06-03 NOTE — Assessment & Plan Note (Signed)
Continue current meds and monitor behaviors. He is combative but the staff feels they are able to redirect him at this time.

## 2014-06-07 DIAGNOSIS — F0391 Unspecified dementia with behavioral disturbance: Secondary | ICD-10-CM | POA: Diagnosis not present

## 2014-06-07 DIAGNOSIS — R269 Unspecified abnormalities of gait and mobility: Secondary | ICD-10-CM | POA: Diagnosis not present

## 2014-06-07 DIAGNOSIS — R488 Other symbolic dysfunctions: Secondary | ICD-10-CM | POA: Diagnosis not present

## 2014-06-07 DIAGNOSIS — F0281 Dementia in other diseases classified elsewhere with behavioral disturbance: Secondary | ICD-10-CM | POA: Diagnosis not present

## 2014-06-07 DIAGNOSIS — R600 Localized edema: Secondary | ICD-10-CM | POA: Diagnosis not present

## 2014-06-07 DIAGNOSIS — G309 Alzheimer's disease, unspecified: Secondary | ICD-10-CM | POA: Diagnosis not present

## 2014-06-07 DIAGNOSIS — R279 Unspecified lack of coordination: Secondary | ICD-10-CM | POA: Diagnosis not present

## 2014-06-07 LAB — BASIC METABOLIC PANEL
BUN: 45 mg/dL — AB (ref 4–21)
Creatinine: 1.5 mg/dL — AB (ref 0.6–1.3)
Glucose: 86 mg/dL
Potassium: 3.8 mmol/L (ref 3.4–5.3)
Sodium: 145 mmol/L (ref 137–147)

## 2014-06-08 DIAGNOSIS — F0391 Unspecified dementia with behavioral disturbance: Secondary | ICD-10-CM | POA: Diagnosis not present

## 2014-06-08 DIAGNOSIS — G309 Alzheimer's disease, unspecified: Secondary | ICD-10-CM | POA: Diagnosis not present

## 2014-06-08 DIAGNOSIS — F0281 Dementia in other diseases classified elsewhere with behavioral disturbance: Secondary | ICD-10-CM | POA: Diagnosis not present

## 2014-06-08 DIAGNOSIS — R488 Other symbolic dysfunctions: Secondary | ICD-10-CM | POA: Diagnosis not present

## 2014-06-08 DIAGNOSIS — R269 Unspecified abnormalities of gait and mobility: Secondary | ICD-10-CM | POA: Diagnosis not present

## 2014-06-08 DIAGNOSIS — R279 Unspecified lack of coordination: Secondary | ICD-10-CM | POA: Diagnosis not present

## 2014-06-09 DIAGNOSIS — G309 Alzheimer's disease, unspecified: Secondary | ICD-10-CM | POA: Diagnosis not present

## 2014-06-09 DIAGNOSIS — R269 Unspecified abnormalities of gait and mobility: Secondary | ICD-10-CM | POA: Diagnosis not present

## 2014-06-09 DIAGNOSIS — F0391 Unspecified dementia with behavioral disturbance: Secondary | ICD-10-CM | POA: Diagnosis not present

## 2014-06-09 DIAGNOSIS — F0281 Dementia in other diseases classified elsewhere with behavioral disturbance: Secondary | ICD-10-CM | POA: Diagnosis not present

## 2014-06-09 DIAGNOSIS — R488 Other symbolic dysfunctions: Secondary | ICD-10-CM | POA: Diagnosis not present

## 2014-06-09 DIAGNOSIS — R279 Unspecified lack of coordination: Secondary | ICD-10-CM | POA: Diagnosis not present

## 2014-06-10 DIAGNOSIS — G309 Alzheimer's disease, unspecified: Secondary | ICD-10-CM | POA: Diagnosis not present

## 2014-06-10 DIAGNOSIS — R279 Unspecified lack of coordination: Secondary | ICD-10-CM | POA: Diagnosis not present

## 2014-06-10 DIAGNOSIS — R488 Other symbolic dysfunctions: Secondary | ICD-10-CM | POA: Diagnosis not present

## 2014-06-10 DIAGNOSIS — R269 Unspecified abnormalities of gait and mobility: Secondary | ICD-10-CM | POA: Diagnosis not present

## 2014-06-10 DIAGNOSIS — F0281 Dementia in other diseases classified elsewhere with behavioral disturbance: Secondary | ICD-10-CM | POA: Diagnosis not present

## 2014-06-10 DIAGNOSIS — F0391 Unspecified dementia with behavioral disturbance: Secondary | ICD-10-CM | POA: Diagnosis not present

## 2014-06-13 DIAGNOSIS — R279 Unspecified lack of coordination: Secondary | ICD-10-CM | POA: Diagnosis not present

## 2014-06-13 DIAGNOSIS — F0281 Dementia in other diseases classified elsewhere with behavioral disturbance: Secondary | ICD-10-CM | POA: Diagnosis not present

## 2014-06-13 DIAGNOSIS — G309 Alzheimer's disease, unspecified: Secondary | ICD-10-CM | POA: Diagnosis not present

## 2014-06-13 DIAGNOSIS — F0391 Unspecified dementia with behavioral disturbance: Secondary | ICD-10-CM | POA: Diagnosis not present

## 2014-06-13 DIAGNOSIS — R488 Other symbolic dysfunctions: Secondary | ICD-10-CM | POA: Diagnosis not present

## 2014-06-13 DIAGNOSIS — R269 Unspecified abnormalities of gait and mobility: Secondary | ICD-10-CM | POA: Diagnosis not present

## 2014-06-14 DIAGNOSIS — R488 Other symbolic dysfunctions: Secondary | ICD-10-CM | POA: Diagnosis not present

## 2014-06-14 DIAGNOSIS — R279 Unspecified lack of coordination: Secondary | ICD-10-CM | POA: Diagnosis not present

## 2014-06-14 DIAGNOSIS — F0391 Unspecified dementia with behavioral disturbance: Secondary | ICD-10-CM | POA: Diagnosis not present

## 2014-06-14 DIAGNOSIS — G309 Alzheimer's disease, unspecified: Secondary | ICD-10-CM | POA: Diagnosis not present

## 2014-06-14 DIAGNOSIS — R269 Unspecified abnormalities of gait and mobility: Secondary | ICD-10-CM | POA: Diagnosis not present

## 2014-06-14 DIAGNOSIS — F0281 Dementia in other diseases classified elsewhere with behavioral disturbance: Secondary | ICD-10-CM | POA: Diagnosis not present

## 2014-06-15 DIAGNOSIS — R269 Unspecified abnormalities of gait and mobility: Secondary | ICD-10-CM | POA: Diagnosis not present

## 2014-06-15 DIAGNOSIS — G309 Alzheimer's disease, unspecified: Secondary | ICD-10-CM | POA: Diagnosis not present

## 2014-06-15 DIAGNOSIS — R279 Unspecified lack of coordination: Secondary | ICD-10-CM | POA: Diagnosis not present

## 2014-06-15 DIAGNOSIS — F0281 Dementia in other diseases classified elsewhere with behavioral disturbance: Secondary | ICD-10-CM | POA: Diagnosis not present

## 2014-06-15 DIAGNOSIS — F0391 Unspecified dementia with behavioral disturbance: Secondary | ICD-10-CM | POA: Diagnosis not present

## 2014-06-15 DIAGNOSIS — R488 Other symbolic dysfunctions: Secondary | ICD-10-CM | POA: Diagnosis not present

## 2014-06-20 ENCOUNTER — Non-Acute Institutional Stay (SKILLED_NURSING_FACILITY): Payer: Medicare Other | Admitting: Adult Health

## 2014-06-20 ENCOUNTER — Encounter: Payer: Self-pay | Admitting: Adult Health

## 2014-06-20 DIAGNOSIS — R600 Localized edema: Secondary | ICD-10-CM

## 2014-06-20 DIAGNOSIS — I482 Chronic atrial fibrillation, unspecified: Secondary | ICD-10-CM

## 2014-06-20 NOTE — Progress Notes (Signed)
Patient ID: Margues Exume, male   DOB: [birth year], 80 y.o.   MRN: [id number]  Nursing Home Location:  [facility]   Code Status: DNR   Place of Service: SNF (31)  Chief Complaint  Patient presents with  . Acute Visit    edema    HPI:  80 y.o. male residing at [facility], memory care section. I was asked to see him today due to the resident's son's concern regarding edema and worsening renal function.  The resident has chronic edema that has been a problem for quite some time. An echo was ordered in March but this was a technically difficult study and they were not able to assess his LV function. The resident does not have SOB or CP, however, he is a poor historian due to his dementia. He has impaired mobility due to the edema, his dementia, and overall debility. His BUN/Cr have risen over time, to 45/1.47 on 06/07/14.     Review of Systems:  Review of Systems  Unable to perform ROS: Dementia    Medications: Patient's Medications  New Prescriptions   No medications on file  Previous Medications   ACETAMINOPHEN (TYLENOL) 325 MG TABLET    Take 2 tablets (650 mg total) by mouth 3 (three) times daily.   ATORVASTATIN (LIPITOR) 10 MG TABLET    Take 10 mg by mouth daily.   DIVALPROEX (DEPAKOTE SPRINKLE) 125 MG CAPSULE    Take 250 mg by mouth 2 (two) times daily.   DONEPEZIL (ARICEPT) 10 MG TABLET    Take 10 mg by mouth daily.   FLUTICASONE (VERAMYST) 27.5 MCG/SPRAY NASAL SPRAY    Place 2 sprays into the nose daily.   FUROSEMIDE (LASIX) 40 MG TABLET    Take 40 mg by mouth daily. Take one tablet daily for edema   KETOCONAZOLE (NIZORAL) 2 % SHAMPOO    Apply 1 application topically. Wash head, face, ears, three times a week, Mon, Thur, Sat   LORATADINE (CLARITIN) 10 MG TABLET    Take 10 mg by mouth daily.   LORAZEPAM (ATIVAN) 0.5 MG TABLET    Take 0.25 mg by mouth 2 (two) times daily.    MEMANTINE HCL ER (NAMENDA XR) 28 MG CP24    Take 1  capsule by mouth daily.   NITROGLYCERIN (NITROSTAT) 0.4 MG SL TABLET    Place 0.4 mg under the tongue every 5 (five) minutes as needed for chest pain.   OMEPRAZOLE (PRILOSEC) 20 MG CAPSULE    Take 20 mg by mouth daily.   QUETIAPINE (SEROQUEL) 25 MG TABLET    Take 25 mg by mouth at bedtime.    SPIRONOLACTONE (ALDACTONE) 25 MG TABLET    Take 1 tablet (25 mg total) by mouth daily.   TAMSULOSIN (FLOMAX) 0.4 MG CAPS    Take 0.4 mg by mouth daily after breakfast.    VITAMIN B-12 (CYANOCOBALAMIN) 1000 MCG TABLET    Take 1,000 mcg by mouth daily.   WARFARIN (COUMADIN) 4 MG TABLET    Take 4 mg by mouth daily.   Modified Medications   No medications on file  Discontinued Medications   No medications on file     Physical Exam:  Filed Vitals:   06/20/14 1328  BP: 109/67  Pulse: 73  Temp: 96.4 F (35.8 C)  Resp: 20  Weight: 240 lb 3.2 oz (108.954 kg)    Physical Exam  Constitutional: He is well-developed, well-nourished, and in no distress.  HENT:  Head: Normocephalic and atraumatic.  Eyes: Pupils are equal, round, and reactive to light.  Neck: No JVD present. No tracheal deviation present. No thyromegaly present.  Cardiovascular: Normal rate.  An irregularly irregular rhythm present.  +3 BLE edema.  No hepatojugular reflux  Pulmonary/Chest: Effort normal and breath sounds normal. No respiratory distress. He has no wheezes.  Abdominal: Soft. Bowel sounds are normal. He exhibits no distension. There is no tenderness.  Musculoskeletal:  Decreased ROM to shoulders, hips, knees. Crepitus to his knees bilat, no swelling or tenderness.  Lymphadenopathy:    He has no cervical adenopathy.  Neurological: He is alert. No cranial nerve deficit.  Oriented to self, verbal but can not f/c well or answer q's  Skin: Skin is warm and dry.    Labs reviewed/Significant Diagnostic Results:  Basic Metabolic Panel:  Recent Labs  03/22/14 04/26/14 06/07/14  NA 140 143 145  K 4.4 4.3 3.8  BUN 32*  43* 45*  CREATININE 1.3 1.6* 1.5*   Lab Results  Component Value Date   WBC 7.2 02/22/2014   HGB 13.5 02/22/2014   HCT 40* 02/22/2014   MCV 91.5 04/08/2008   PLT 240 02/22/2014     Assessment/Plan  Edema of both legs Continue TED hose. Check 2D echo out of facility for better quality test. Discussed this with his son, Dr. Cheryln Manly. There is concern for prerenal azotemia so we have decided to decrease his Lasix to 40mg  daily and continue the aldactone. We have asked the staff to encourage fluid. The increased diuretic dose seems to have decreased his renal function without reducing the edema. A f/u BMP has been ordered in 1 week.  Atrial fibrillation His rate is controlled. He continues on coumadin. See plan above.  Cindi Carbon, ANP [facility] [phone]

## 2014-06-20 NOTE — Assessment & Plan Note (Signed)
His rate is controlled. He continues on coumadin. See plan above.

## 2014-06-20 NOTE — Assessment & Plan Note (Signed)
Continue TED hose. Check 2D echo out of facility for better quality test. Discussed this with his son, Dr. Cheryln Manly. There is concern for prerenal azotemia so we have decided to decrease his Lasix to 40mg  daily and continue the aldactone. We have asked the staff to encourage fluid. The increased diuretic dose seems to have decreased his renal function without reducing the edema. A f/u BMP has been ordered in 1 week.

## 2014-06-21 DIAGNOSIS — I4891 Unspecified atrial fibrillation: Secondary | ICD-10-CM | POA: Diagnosis not present

## 2014-06-22 ENCOUNTER — Ambulatory Visit (HOSPITAL_COMMUNITY): Payer: Medicare Other | Attending: Cardiology | Admitting: Cardiology

## 2014-06-22 ENCOUNTER — Other Ambulatory Visit (HOSPITAL_COMMUNITY): Payer: Self-pay | Admitting: Internal Medicine

## 2014-06-22 DIAGNOSIS — R600 Localized edema: Secondary | ICD-10-CM

## 2014-06-22 DIAGNOSIS — E785 Hyperlipidemia, unspecified: Secondary | ICD-10-CM | POA: Diagnosis not present

## 2014-06-22 DIAGNOSIS — R0600 Dyspnea, unspecified: Secondary | ICD-10-CM

## 2014-06-22 DIAGNOSIS — R609 Edema, unspecified: Secondary | ICD-10-CM | POA: Diagnosis not present

## 2014-06-22 DIAGNOSIS — I4891 Unspecified atrial fibrillation: Secondary | ICD-10-CM | POA: Diagnosis not present

## 2014-06-22 DIAGNOSIS — F0390 Unspecified dementia without behavioral disturbance: Secondary | ICD-10-CM | POA: Diagnosis not present

## 2014-06-22 DIAGNOSIS — D682 Hereditary deficiency of other clotting factors: Secondary | ICD-10-CM | POA: Diagnosis not present

## 2014-06-22 NOTE — Progress Notes (Signed)
Echo performed. 

## 2014-06-23 DIAGNOSIS — R488 Other symbolic dysfunctions: Secondary | ICD-10-CM | POA: Diagnosis not present

## 2014-06-23 DIAGNOSIS — R269 Unspecified abnormalities of gait and mobility: Secondary | ICD-10-CM | POA: Diagnosis not present

## 2014-06-23 DIAGNOSIS — R279 Unspecified lack of coordination: Secondary | ICD-10-CM | POA: Diagnosis not present

## 2014-06-23 DIAGNOSIS — G309 Alzheimer's disease, unspecified: Secondary | ICD-10-CM | POA: Diagnosis not present

## 2014-06-23 DIAGNOSIS — F0281 Dementia in other diseases classified elsewhere with behavioral disturbance: Secondary | ICD-10-CM | POA: Diagnosis not present

## 2014-06-23 DIAGNOSIS — F0391 Unspecified dementia with behavioral disturbance: Secondary | ICD-10-CM | POA: Diagnosis not present

## 2014-06-24 DIAGNOSIS — R279 Unspecified lack of coordination: Secondary | ICD-10-CM | POA: Diagnosis not present

## 2014-06-24 DIAGNOSIS — F0281 Dementia in other diseases classified elsewhere with behavioral disturbance: Secondary | ICD-10-CM | POA: Diagnosis not present

## 2014-06-24 DIAGNOSIS — R269 Unspecified abnormalities of gait and mobility: Secondary | ICD-10-CM | POA: Diagnosis not present

## 2014-06-24 DIAGNOSIS — G309 Alzheimer's disease, unspecified: Secondary | ICD-10-CM | POA: Diagnosis not present

## 2014-06-24 DIAGNOSIS — F0391 Unspecified dementia with behavioral disturbance: Secondary | ICD-10-CM | POA: Diagnosis not present

## 2014-06-24 DIAGNOSIS — R488 Other symbolic dysfunctions: Secondary | ICD-10-CM | POA: Diagnosis not present

## 2014-06-27 DIAGNOSIS — R609 Edema, unspecified: Secondary | ICD-10-CM | POA: Diagnosis not present

## 2014-06-27 DIAGNOSIS — R062 Wheezing: Secondary | ICD-10-CM | POA: Diagnosis not present

## 2014-06-27 DIAGNOSIS — R269 Unspecified abnormalities of gait and mobility: Secondary | ICD-10-CM | POA: Diagnosis not present

## 2014-06-27 LAB — CBC AND DIFFERENTIAL
HEMATOCRIT: 40 % — AB (ref 41–53)
HEMOGLOBIN: 13.8 g/dL (ref 13.5–17.5)
PLATELETS: 187 10*3/uL (ref 150–399)
WBC: 6.9 10^3/mL

## 2014-06-27 LAB — BASIC METABOLIC PANEL
BUN: 39 mg/dL — AB (ref 4–21)
Creatinine: 1.4 mg/dL — AB (ref 0.6–1.3)
Glucose: 96 mg/dL
Potassium: 4.5 mmol/L (ref 3.4–5.3)
SODIUM: 140 mmol/L (ref 137–147)

## 2014-06-28 DIAGNOSIS — G309 Alzheimer's disease, unspecified: Secondary | ICD-10-CM | POA: Diagnosis not present

## 2014-06-28 DIAGNOSIS — F0391 Unspecified dementia with behavioral disturbance: Secondary | ICD-10-CM | POA: Diagnosis not present

## 2014-06-28 DIAGNOSIS — R488 Other symbolic dysfunctions: Secondary | ICD-10-CM | POA: Diagnosis not present

## 2014-06-28 DIAGNOSIS — R269 Unspecified abnormalities of gait and mobility: Secondary | ICD-10-CM | POA: Diagnosis not present

## 2014-06-28 DIAGNOSIS — R279 Unspecified lack of coordination: Secondary | ICD-10-CM | POA: Diagnosis not present

## 2014-06-28 DIAGNOSIS — F0281 Dementia in other diseases classified elsewhere with behavioral disturbance: Secondary | ICD-10-CM | POA: Diagnosis not present

## 2014-06-30 ENCOUNTER — Non-Acute Institutional Stay (SKILLED_NURSING_FACILITY): Payer: Medicare Other | Admitting: Adult Health

## 2014-06-30 DIAGNOSIS — R600 Localized edema: Secondary | ICD-10-CM

## 2014-06-30 DIAGNOSIS — F0391 Unspecified dementia with behavioral disturbance: Secondary | ICD-10-CM

## 2014-06-30 DIAGNOSIS — N400 Enlarged prostate without lower urinary tract symptoms: Secondary | ICD-10-CM

## 2014-06-30 DIAGNOSIS — F03918 Unspecified dementia, unspecified severity, with other behavioral disturbance: Secondary | ICD-10-CM

## 2014-06-30 DIAGNOSIS — N183 Chronic kidney disease, stage 3 unspecified: Secondary | ICD-10-CM

## 2014-06-30 NOTE — Assessment & Plan Note (Signed)
Slightly worse with 5 lb weight gain. Spoke with both of resident's children and they agree with increasing the lasix back to 80mg  and discontinuing the aldactone. Recheck weight and BMP on Monday 07/04/14.  He did not appear to have heart failure on the most recent echo. We will continue TED hose and weight monitoring. He is already on a heart healthy diet.

## 2014-06-30 NOTE — Assessment & Plan Note (Signed)
Advanced stage. His family would like to discontinue the Namenda and Aricept. I will taper off the Namenda now and the Aricept at our next visit.

## 2014-06-30 NOTE — Progress Notes (Signed)
Patient ID: Bryan [name], male   DOB: [birth year], 80 y.o.   MRN: [id number]    Patient ID: Bryan [name], male   DOB: Nov 01, 1922, 80 y.o.   MRN: [id number]  Nursing Home Location:  [facility]   Code Status: DNR   Place of Service: SNF (31)  Chief Complaint  Patient presents with  . Acute Visit    weight gain    HPI:  80 y.o. male residing at [facility], memory care section. I was asked to see him on 06/20/14 due to worsening renal function, his BUN/Cr was 45/1.47. His lasix was reduced to 40mg . An echo was performed showing an EF of 50-55%.  I was asked to see him today due to a 5lb weight gain in a week. There has been no reports of SOB or CP.   Review of Systems:  Review of Systems  Unable to perform ROS: Dementia    Medications: Patient's Medications  New Prescriptions   No medications on file  Previous Medications   ACETAMINOPHEN (TYLENOL) 325 MG TABLET    Take 2 tablets (650 mg total) by mouth 3 (three) times daily.   ATORVASTATIN (LIPITOR) 10 MG TABLET    Take 10 mg by mouth daily.   DIVALPROEX (DEPAKOTE SPRINKLE) 125 MG CAPSULE    Take 250 mg by mouth 2 (two) times daily.   DONEPEZIL (ARICEPT) 10 MG TABLET    Take 10 mg by mouth daily.   FUROSEMIDE (LASIX) 40 MG TABLET    Take 80 mg by mouth daily. Take one tablet daily for edema   IPRATROPIUM (ATROVENT) 0.06 % NASAL SPRAY    Place 2 sprays into both nostrils 4 (four) times daily.   KETOCONAZOLE (NIZORAL) 2 % SHAMPOO    Apply 1 application topically. Wash head, face, ears, three times a week, Mon, Thur, Sat   LORAZEPAM (ATIVAN) 0.5 MG TABLET    Take 0.25 mg by mouth 2 (two) times daily.    NITROGLYCERIN (NITROSTAT) 0.4 MG SL TABLET    Place 0.4 mg under the tongue every 5 (five) minutes as needed for chest pain.   OMEPRAZOLE (PRILOSEC) 20 MG CAPSULE    Take 20 mg by mouth daily.   QUETIAPINE (SEROQUEL) 25 MG TABLET    Take 25 mg by mouth at bedtime.    SPIRONOLACTONE  (ALDACTONE) 25 MG TABLET    Take 1 tablet (25 mg total) by mouth daily.   WARFARIN (COUMADIN) 4 MG TABLET    Take 4 mg by mouth daily.   Modified Medications   No medications on file  Discontinued Medications   FLUTICASONE (VERAMYST) 27.5 MCG/SPRAY NASAL SPRAY    Place 2 sprays into the nose daily.   LORATADINE (CLARITIN) 10 MG TABLET    Take 10 mg by mouth daily.   MEMANTINE HCL ER (NAMENDA XR) 28 MG CP24    Take 1 capsule by mouth daily.   TAMSULOSIN (FLOMAX) 0.4 MG CAPS    Take 0.4 mg by mouth daily after breakfast.    VITAMIN B-12 (CYANOCOBALAMIN) 1000 MCG TABLET    Take 1,000 mcg by mouth daily.     Physical Exam:  Filed Vitals:   06/30/14 1118  BP: 120/72  Pulse: 62  Temp: 97.8 F (36.6 C)  Resp: 20  Weight: 245 lb 9.6 oz (111.403 kg)  SpO2: 96%    Physical Exam  Constitutional: He is well-developed, well-nourished, and in no distress.  HENT:  Head: Normocephalic and atraumatic.  Eyes: Pupils  are equal, round, and reactive to light.  Neck: No JVD present. No tracheal deviation present. No thyromegaly present.  Cardiovascular: Normal rate.  An irregularly irregular rhythm present.  +3 BLE edema.  No hepatojugular reflux  Pulmonary/Chest: Effort normal and breath sounds normal. No respiratory distress. He has no wheezes.  Abdominal: Soft. Bowel sounds are normal. He exhibits no distension. There is no tenderness.  Musculoskeletal:  Decreased ROM to shoulders, hips, knees. Crepitus to his knees bilat, no swelling or tenderness.  Lymphadenopathy:    He has no cervical adenopathy.  Neurological: He is alert. No cranial nerve deficit.  Oriented to self, verbal but can not f/c well or answer q's  Skin: Skin is warm and dry.    Labs reviewed/Significant Diagnostic Results:  06/22/14 2D echo EF 50-55% with no regional wall abnormality  Basic Metabolic Panel:  Recent Labs  04/26/14 06/07/14 06/27/14  NA 143 145 140  K 4.3 3.8 4.5  BUN 43* 45* 39*  CREATININE 1.6*  1.5* 1.4*   Lab Results  Component Value Date   WBC 6.9 06/27/2014   HGB 13.8 06/27/2014   HCT 40* 06/27/2014   MCV 91.5 04/08/2008   PLT 187 06/27/2014     Assessment/Plan  Edema of both legs Slightly worse with 5 lb weight gain. Spoke with both of resident's children and they agree with increasing the lasix back to 80mg  and discontinuing the aldactone. Recheck weight and BMP on Monday 07/04/14.  He did not appear to have heart failure on the most recent echo. We will continue TED hose and weight monitoring. He is already on a heart healthy diet.   Dementia with behavioral disturbance Advanced stage. His family would like to discontinue the Namenda and Aricept. I will taper off the Namenda now and the Aricept at our next visit.  BPH (benign prostatic hyperplasia) D/C flomax per family request and monitor for symptoms of urinary retention  Chronic kidney disease Improved with decreased lasix dose, however, he is gaining weight. Will increase the lasix back up to 80 and d/c aldactone per family request. See above plan  I will see him again on 07/04/14. Consider nutritional supplementation for low albumin.  Cindi Carbon, ANP [facility] [phone]

## 2014-06-30 NOTE — Assessment & Plan Note (Signed)
D/C flomax per family request and monitor for symptoms of urinary retention

## 2014-06-30 NOTE — Assessment & Plan Note (Signed)
Improved with decreased lasix dose, however, he is gaining weight. Will increase the lasix back up to 80 and d/c aldactone per family request. See above plan

## 2014-07-04 DIAGNOSIS — N189 Chronic kidney disease, unspecified: Secondary | ICD-10-CM | POA: Diagnosis not present

## 2014-07-05 ENCOUNTER — Encounter: Payer: Self-pay | Admitting: Internal Medicine

## 2014-07-05 ENCOUNTER — Non-Acute Institutional Stay (SKILLED_NURSING_FACILITY): Payer: Medicare Other | Admitting: Internal Medicine

## 2014-07-05 DIAGNOSIS — R269 Unspecified abnormalities of gait and mobility: Secondary | ICD-10-CM | POA: Diagnosis not present

## 2014-07-05 DIAGNOSIS — F0391 Unspecified dementia with behavioral disturbance: Secondary | ICD-10-CM | POA: Diagnosis not present

## 2014-07-05 DIAGNOSIS — I482 Chronic atrial fibrillation, unspecified: Secondary | ICD-10-CM

## 2014-07-05 DIAGNOSIS — I872 Venous insufficiency (chronic) (peripheral): Secondary | ICD-10-CM | POA: Diagnosis not present

## 2014-07-05 DIAGNOSIS — F03918 Unspecified dementia, unspecified severity, with other behavioral disturbance: Secondary | ICD-10-CM

## 2014-07-05 DIAGNOSIS — E785 Hyperlipidemia, unspecified: Secondary | ICD-10-CM | POA: Diagnosis not present

## 2014-07-05 DIAGNOSIS — Z7901 Long term (current) use of anticoagulants: Secondary | ICD-10-CM

## 2014-07-05 DIAGNOSIS — N183 Chronic kidney disease, stage 3 unspecified: Secondary | ICD-10-CM

## 2014-07-05 DIAGNOSIS — R600 Localized edema: Secondary | ICD-10-CM

## 2014-07-05 DIAGNOSIS — G309 Alzheimer's disease, unspecified: Secondary | ICD-10-CM

## 2014-07-05 DIAGNOSIS — F0280 Dementia in other diseases classified elsewhere without behavioral disturbance: Secondary | ICD-10-CM

## 2014-07-05 NOTE — Progress Notes (Signed)
Patient ID: Bryan [name], male   DOB: 04-09-1923, 80 y.o.   MRN: [id number]  Location:  [facility] Provider:  Rexene Edison. Mariea Clonts, D.O., C.M.D.  Code Status:  DNR, no hospitalizations  Chief Complaint  Patient presents with  . Acute Visit    edema, cardiorenal syndrome, right arm abrasion    HPI:  80 yo white male was seen this am with h/o Alzheimer's disease with behavioral disturbance--dependent in ADLs except can minimally feed himself some days and is able to throw a ball during activities sometimes, hyperlipidemia, chronic venous insuffiency, afib on coumadin which is rate controlled, osteoarthritis of his thoracic and lumbar spine, and gait disorder (requires hoyer lift for transfers at this point).  His son, Dr. Cheryln Manly, was visiting and requested an update from the resident's physician.  [name] seems to have stabilized at this time.  Weight trending down and creatinine stable in 1.4 range with lasix $RemoveBe'80mg'OGBuIbmZE$  daily and off spironolactone.  Potassium also wnl.  He cannot provide much history himself.  He was very sleepy this am but did answer that he was not in pain.  He has some persistent 2+ pitting edema bilaterally, but his son and staff feel this has improved (my first times seeing him).    We also discussed his persistent decline in terms of cognition, as well.  We are gradually tapering down his namenda and aricept due to lack of significant benefit at this stage of his dementia.    Review of Systems:  Review of Systems  Constitutional: Positive for malaise/fatigue.  Respiratory: Negative for shortness of breath.   Cardiovascular: Positive for leg swelling. Negative for chest pain and palpitations.       Had chest pain earlier this month, but none currently  Gastrointestinal: Negative for abdominal pain.  Genitourinary: Positive for frequency.       With lasix--review of outputs shows large volumes consistently with lasix $RemoveBe'80mg'[id number]$   Musculoskeletal:  Negative for back pain.  Skin:       Bandlike excoriation over his right arm   Neurological: Positive for weakness. Negative for dizziness and loss of consciousness.  Endo/Heme/Allergies: Bruises/bleeds easily.  Psychiatric/Behavioral: Positive for memory loss.    Medications: Patient's Medications  New Prescriptions   No medications on file  Previous Medications   ACETAMINOPHEN (TYLENOL) 325 MG TABLET    Take 2 tablets (650 mg total) by mouth 3 (three) times daily.   ATORVASTATIN (LIPITOR) 10 MG TABLET    Take 10 mg by mouth daily.   DIVALPROEX (DEPAKOTE SPRINKLE) 125 MG CAPSULE    Take 250 mg by mouth 2 (two) times daily.   DONEPEZIL (ARICEPT) 10 MG TABLET    Take 10 mg by mouth daily.   FUROSEMIDE (LASIX) 40 MG TABLET    Take 80 mg by mouth daily. Take one tablet daily for edema   IPRATROPIUM (ATROVENT) 0.06 % NASAL SPRAY    Place 2 sprays into both nostrils 4 (four) times daily.   KETOCONAZOLE (NIZORAL) 2 % SHAMPOO    Apply 1 application topically. Wash head, face, ears, three times a week, Mon, Thur, Sat   LORAZEPAM (ATIVAN) 0.5 MG TABLET    Take 0.25 mg by mouth 2 (two) times daily.    NITROGLYCERIN (NITROSTAT) 0.4 MG SL TABLET    Place 0.4 mg under the tongue every 5 (five) minutes as needed for chest pain.   OMEPRAZOLE (PRILOSEC) 20 MG CAPSULE    Take 20 mg by mouth daily.  QUETIAPINE (SEROQUEL) 25 MG TABLET    Take 25 mg by mouth at bedtime.    SPIRONOLACTONE (ALDACTONE) 25 MG TABLET    Take 1 tablet (25 mg total) by mouth daily.   WARFARIN (COUMADIN) 4 MG TABLET    Take 4 mg by mouth daily.   Modified Medications   No medications on file  Discontinued Medications   No medications on file    Physical Exam: Filed Vitals:   07/05/14 1319  BP: 131/73  Pulse: 76  Temp: 97.2 F (36.2 C)  Resp: 20  Height: 6' (1.829 m)  Weight: 242 lb (109.77 kg)   Physical Exam  Constitutional: No distress.  Neck: No JVD present.  Cardiovascular:  irreg irreg; 2+ pitting edema    Pulmonary/Chest: Effort normal and breath sounds normal. No respiratory distress.  Musculoskeletal: Normal range of motion.  Neurological:  Is arousable, but does not stay awake for long; answers a few questions, but changes responses frequently  Skin: Skin is warm and dry.  Bandlike excoriation of right upper arm  Psychiatric:  Was pleasant this am     Labs reviewed: Basic Metabolic Panel:  Recent Labs  04/26/14 06/07/14 06/27/14  NA 143 145 140  K 4.3 3.8 4.5  BUN 43* 45* 39*  CREATININE 1.6* 1.5* 1.4*    CBC:  Recent Labs  08/10/13 02/22/14 06/27/14  WBC 6.3 7.2 6.9  HGB 13.7 13.5 13.8  HCT 41 40* 40*  PLT 224 240 187     Assessment/Plan 1. Chronic venous insufficiency -continues to have about 2+ pitting edema, but has had weight loss and excellent urine output with lasix $RemoveBe'80mg'BNZSAhMwH$  and off of aldactone -only elevates feet if in chair in activity area -he is to be getting new compression hose -will continue current regimen -recheck bmp Tuesday and prealbumin, also to assess his nutritional status  2. Chronic atrial fibrillation -rate is controlled -continues on coumadin for anticoagulation  3. Alzheimer's disease -continues aricept and namenda is being tapered off at this point  4. Dementia with behavioral disturbance -is on depakote and seroquel for mood stabilization--check depakote level with next labs  5. CKD (chronic kidney disease) stage 3, GFR 30-59 ml/min -has stabilized for now -will recheck bmp on tuesday  6. Long term current use of anticoagulant therapy -continues on coumadin with INR goal 2-3 for stroke prevention in afib--last INR 2.4 05/17/14  7. Hyperlipidemia -remains on statin therapy--would also consider discontinuing this as we are tapering down aricept and namenda (did not touch on this with his son, however)  64. Gait disorder -now requiring hoyer lift for transfers and abrasion on right arm appears to be due to this -staff are being  reinserviced on this to prevent recurrence -this appears more severe in his case due to his coumadin therapy  9. Edema of both legs -improved with lasix $RemoveBe'80mg'gEmdtYsKR$  daily, compression hose, low sodium diet and elevating feet -f/u labs tues  Family/ staff Communication: I met with his son, [name], who is a cardiologist in [place] this am  Goals of care: DNR, [place], the Director of Nursing has provided [name] with a MOST form to discuss with the remainder of the family   Labs/tests ordered:  Bmp, prealbumin, depakote level tuesday

## 2014-07-06 ENCOUNTER — Encounter: Payer: Self-pay | Admitting: Internal Medicine

## 2014-07-12 DIAGNOSIS — Z79899 Other long term (current) drug therapy: Secondary | ICD-10-CM | POA: Diagnosis not present

## 2014-07-19 DIAGNOSIS — Z79899 Other long term (current) drug therapy: Secondary | ICD-10-CM | POA: Diagnosis not present

## 2014-07-19 DIAGNOSIS — Z7901 Long term (current) use of anticoagulants: Secondary | ICD-10-CM | POA: Diagnosis not present

## 2014-07-19 DIAGNOSIS — I4891 Unspecified atrial fibrillation: Secondary | ICD-10-CM | POA: Diagnosis not present

## 2014-07-19 DIAGNOSIS — R062 Wheezing: Secondary | ICD-10-CM | POA: Diagnosis not present

## 2014-07-19 DIAGNOSIS — I2510 Atherosclerotic heart disease of native coronary artery without angina pectoris: Secondary | ICD-10-CM | POA: Diagnosis not present

## 2014-07-19 LAB — POCT INR: INR: 2.2 — AB (ref 0.9–1.1)

## 2014-07-19 LAB — BASIC METABOLIC PANEL
BUN: 38 mg/dL — AB (ref 4–21)
CREATININE: 1.2 mg/dL (ref 0.6–1.3)
GLUCOSE: 100 mg/dL
Potassium: 3.6 mmol/L (ref 3.4–5.3)
Sodium: 143 mmol/L (ref 137–147)

## 2014-07-19 LAB — PROTIME-INR: PROTIME: 24.6 s — AB (ref 10.0–13.8)

## 2014-08-09 ENCOUNTER — Encounter: Payer: Self-pay | Admitting: Adult Health

## 2014-08-09 ENCOUNTER — Non-Acute Institutional Stay (SKILLED_NURSING_FACILITY): Payer: Medicare Other | Admitting: Adult Health

## 2014-08-09 DIAGNOSIS — E46 Unspecified protein-calorie malnutrition: Secondary | ICD-10-CM | POA: Insufficient documentation

## 2014-08-09 DIAGNOSIS — E785 Hyperlipidemia, unspecified: Secondary | ICD-10-CM

## 2014-08-09 DIAGNOSIS — Z7901 Long term (current) use of anticoagulants: Secondary | ICD-10-CM | POA: Diagnosis not present

## 2014-08-09 DIAGNOSIS — F0391 Unspecified dementia with behavioral disturbance: Secondary | ICD-10-CM

## 2014-08-09 DIAGNOSIS — F0280 Dementia in other diseases classified elsewhere without behavioral disturbance: Secondary | ICD-10-CM

## 2014-08-09 DIAGNOSIS — F03918 Unspecified dementia, unspecified severity, with other behavioral disturbance: Secondary | ICD-10-CM

## 2014-08-09 DIAGNOSIS — G309 Alzheimer's disease, unspecified: Secondary | ICD-10-CM | POA: Diagnosis not present

## 2014-08-09 DIAGNOSIS — I872 Venous insufficiency (chronic) (peripheral): Secondary | ICD-10-CM | POA: Diagnosis not present

## 2014-08-09 DIAGNOSIS — N183 Chronic kidney disease, stage 3 unspecified: Secondary | ICD-10-CM

## 2014-08-09 DIAGNOSIS — I482 Chronic atrial fibrillation, unspecified: Secondary | ICD-10-CM

## 2014-08-09 NOTE — Progress Notes (Signed)
Patient ID: Bryan [name], male   DOB: [birth year], 81 y.o.   MRN: [id number]  Location:  [facility]   Code Status:  DNR, no hospitalizations  Chief Complaint  Patient presents with  . Medical Management of Chronic Issues    HPI:  81 yo white male was seen this am with h/o Alzheimer's disease with behavioral disturbance--dependent in ADLs except can minimally feed himself some days and is able to throw a ball during activities sometimes, hyperlipidemia, chronic venous insuffiency, afib on coumadin which is rate controlled, osteoarthritis of his thoracic and lumbar spine, and gait disorder (requires hoyer lift for transfers at this point).  His VS have been stable over the past month. His weight has trended down over time, currently at 237.4 lb (6lb loss in 2 months). His BUN/Cr has improved with discontinuation of aldactone at 38/1.23 on 07/19/14.  Review of Systems:  Review of Systems  Unable to perform ROS: dementia     Medications: Patient's Medications  New Prescriptions   No medications on file  Previous Medications   ACETAMINOPHEN (TYLENOL) 325 MG TABLET    Take 2 tablets (650 mg total) by mouth 3 (three) times daily.   ATORVASTATIN (LIPITOR) 10 MG TABLET    Take 10 mg by mouth daily.   DIVALPROEX (DEPAKOTE SPRINKLE) 125 MG CAPSULE    Take 250 mg by mouth 2 (two) times daily.   FUROSEMIDE (LASIX) 40 MG TABLET    Take 80 mg by mouth daily. Take one tablet daily for edema   IPRATROPIUM (ATROVENT) 0.06 % NASAL SPRAY    Place 2 sprays into both nostrils 4 (four) times daily.   KETOCONAZOLE (NIZORAL) 2 % SHAMPOO    Apply 1 application topically. Wash head, face, ears, three times a week, Mon, Thur, Sat   LORAZEPAM (ATIVAN) 0.5 MG TABLET    Take 0.5 mg by mouth 2 (two) times daily.    NITROGLYCERIN (NITROSTAT) 0.4 MG SL TABLET    Place 0.4 mg under the tongue every 5 (five) minutes as needed for chest pain.   OMEPRAZOLE (PRILOSEC) 20 MG CAPSULE    Take 20  mg by mouth daily.   QUETIAPINE (SEROQUEL) 25 MG TABLET    Take 25 mg by mouth at bedtime.    SPIRONOLACTONE (ALDACTONE) 25 MG TABLET    Take 1 tablet (25 mg total) by mouth daily.   WARFARIN (COUMADIN) 4 MG TABLET    Take 4 mg by mouth daily.   Modified Medications   No medications on file  Discontinued Medications   DONEPEZIL (ARICEPT) 10 MG TABLET    Take 10 mg by mouth daily.    Physical Exam: Filed Vitals:   08/09/14 1017  BP: 111/86  Pulse: 57  Temp: 98.4 F (36.9 C)  Resp: 18  Weight: 237 lb 6.4 oz (107.684 kg)  SpO2: 94%   Physical Exam  Constitutional: No distress.  HENT:  Head: Normocephalic.  Eyes: Pupils are equal, round, and reactive to light.  Neck: No JVD present. No tracheal deviation present. No thyromegaly present.  Cardiovascular:  irreg irreg; 2+ pitting edema  Pulmonary/Chest: Effort normal and breath sounds normal. No respiratory distress.  Abdominal: Soft. Bowel sounds are normal. He exhibits no distension. There is no tenderness.  Musculoskeletal: Normal range of motion.  Lymphadenopathy:    He has no cervical adenopathy.  Neurological:  Is arousable, but does not stay awake for long; answers a few questions, but changes responses frequently  Skin:  Skin is warm and dry. He is not diaphoretic.  Psychiatric: He has a normal mood and affect.     Labs/Studies reviewed:  2D echo 06/2015 Study Conclusions  - Left ventricle: The cavity size was normal. There was mild focal basal hypertrophy of the septum. Systolic function was normal. The estimated ejection fraction was in the range of 50% to 55%. Although no diagnostic regional wall motion abnormality was identified, this possibility cannot be completely excluded on the basis of this study. - Aortic valve: Cusp separation was reduced. - Mitral valve: Calcified annulus. Mildly thickened leaflets . - Left atrium: The atrium was mildly dilated.  Basic Metabolic Panel:  Recent Labs   06/07/14 06/27/14 07/19/14  NA 145 140 143  K 3.8 4.5 3.6  BUN 45* 39* 38*  CREATININE 1.5* 1.4* 1.2    CBC:  Recent Labs  08/10/13 02/22/14 06/27/14  WBC 6.3 7.2 6.9  HGB 13.7 13.5 13.8  HCT 41 40* 40*  PLT 224 240 187   Lab Results  Component Value Date   INR 2.2* 07/19/2014   INR 2.4* 05/17/2014   INR 2.5* 03/22/2014   PROTIME 24.6* 07/19/2014   PROTIME 25.9* 05/17/2014     Assessment/Plan 1. Chronic venous insufficiency -continues to have about 2+ pitting edema, but has had weight loss and excellent urine output with lasix 80mg  and off of aldactone.  Continue lasix, recheck CMP in 1 month  2. Chronic atrial fibrillation -rate is controlled -continues on coumadin for anticoagulation, INR due next week  3. Alzheimer's disease -Per discussion with family at last visit, will d/c aricept as it is most likely not providing benefit at this point  4. Dementia with behavioral disturbance -is on depakote and seroquel for mood stabilization--check depakote level with next labs  5. CKD (chronic kidney disease) stage 3, GFR 30-59 ml/min -Improved, see above  6. Long term current use of anticoagulant therapy -continues on coumadin with INR goal 2-3 for stroke prevention in afib--last INR 2.4 05/17/14  7. Hyperlipidemia -Continue statin, will consider discontinuing at next meeting  8.  Protein calorie malnutrition Last Albumin 3.0 on unjury powder qd, will check [place], [place] [phone]

## 2014-08-18 DIAGNOSIS — Z7901 Long term (current) use of anticoagulants: Secondary | ICD-10-CM | POA: Diagnosis not present

## 2014-08-25 DIAGNOSIS — Z7901 Long term (current) use of anticoagulants: Secondary | ICD-10-CM | POA: Diagnosis not present

## 2014-09-01 ENCOUNTER — Emergency Department (HOSPITAL_COMMUNITY)
Admission: EM | Admit: 2014-09-01 | Discharge: 2014-09-02 | Disposition: A | Discharge status: Home / Self Care | Payer: Medicare Other | Attending: Emergency Medicine | Admitting: Emergency Medicine

## 2014-09-01 ENCOUNTER — Encounter (HOSPITAL_COMMUNITY): Payer: Self-pay | Admitting: Emergency Medicine

## 2014-09-01 DIAGNOSIS — Z7901 Long term (current) use of anticoagulants: Secondary | ICD-10-CM | POA: Insufficient documentation

## 2014-09-01 DIAGNOSIS — E785 Hyperlipidemia, unspecified: Secondary | ICD-10-CM | POA: Diagnosis not present

## 2014-09-01 DIAGNOSIS — R0600 Dyspnea, unspecified: Secondary | ICD-10-CM | POA: Diagnosis not present

## 2014-09-01 DIAGNOSIS — G309 Alzheimer's disease, unspecified: Secondary | ICD-10-CM | POA: Diagnosis not present

## 2014-09-01 DIAGNOSIS — K219 Gastro-esophageal reflux disease without esophagitis: Secondary | ICD-10-CM | POA: Insufficient documentation

## 2014-09-01 DIAGNOSIS — Z8673 Personal history of transient ischemic attack (TIA), and cerebral infarction without residual deficits: Secondary | ICD-10-CM | POA: Diagnosis not present

## 2014-09-01 DIAGNOSIS — Z87438 Personal history of other diseases of male genital organs: Secondary | ICD-10-CM | POA: Diagnosis not present

## 2014-09-01 DIAGNOSIS — F0391 Unspecified dementia with behavioral disturbance: Secondary | ICD-10-CM | POA: Diagnosis not present

## 2014-09-01 DIAGNOSIS — R062 Wheezing: Secondary | ICD-10-CM | POA: Diagnosis not present

## 2014-09-01 DIAGNOSIS — R269 Unspecified abnormalities of gait and mobility: Secondary | ICD-10-CM | POA: Diagnosis not present

## 2014-09-01 DIAGNOSIS — J189 Pneumonia, unspecified organism: Secondary | ICD-10-CM | POA: Diagnosis not present

## 2014-09-01 DIAGNOSIS — I517 Cardiomegaly: Secondary | ICD-10-CM | POA: Diagnosis not present

## 2014-09-01 DIAGNOSIS — R0902 Hypoxemia: Secondary | ICD-10-CM | POA: Diagnosis not present

## 2014-09-01 DIAGNOSIS — Z79891 Long term (current) use of opiate analgesic: Secondary | ICD-10-CM | POA: Diagnosis not present

## 2014-09-01 DIAGNOSIS — Z880 Allergy status to penicillin: Secondary | ICD-10-CM | POA: Diagnosis not present

## 2014-09-01 DIAGNOSIS — I4891 Unspecified atrial fibrillation: Secondary | ICD-10-CM | POA: Diagnosis not present

## 2014-09-01 DIAGNOSIS — R05 Cough: Secondary | ICD-10-CM | POA: Diagnosis not present

## 2014-09-01 DIAGNOSIS — Z79899 Other long term (current) drug therapy: Secondary | ICD-10-CM | POA: Diagnosis not present

## 2014-09-01 DIAGNOSIS — R059 Cough, unspecified: Secondary | ICD-10-CM

## 2014-09-01 DIAGNOSIS — J9811 Atelectasis: Secondary | ICD-10-CM | POA: Diagnosis not present

## 2014-09-01 DIAGNOSIS — R1312 Dysphagia, oropharyngeal phase: Secondary | ICD-10-CM | POA: Diagnosis not present

## 2014-09-01 NOTE — ED Provider Notes (Signed)
CSN: [id number]     Arrival date & time 09/01/14  2326 History  This chart was scribed for Veryl Speak, MD by Rayfield Citizen, ED Scribe. This patient was seen in room [facility] and the patient's care was started at 11:36 PM.    Chief Complaint  Patient presents with  . Pneumonia   The history is provided by a relative and the EMS personnel. No language interpreter was used.     HPI Comments: Bryan [name] is a 81 y.o. male with past medical history of afib, CVA, HLD, long term use of anticoagulants, Alzheimer's disease and dementia with behavioral disturbance who presents to the Emergency Department via EMS complaining of pneumonia and decreased mental status. Patient's nephew reports that patient is in [facility] and was sent here for evaluation after a CXR that showed pneumonia; [facility] staff report that patient has become less responsive than normal, though he is still responsive to verbal stimuli at this time. Nephew reports fever yesterday but denies fever today.   He is anticoagulated on Coumadin (4mg  per day) at this time. Patient reportedly has abdominal distension and pedal edema at baseline. He has been wearing O2 for the past few days. He has a DNR and MOST form.    Past Medical History  Diagnosis Date  . Hyperlipidemia   . Alzheimer's disease 2013  . GERD (gastroesophageal reflux disease)   . BPH (benign prostatic hyperplasia)   . Atrial fibrillation     Long term anti coagulation w/ warfarin fro stroke risk reduction  . 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 Lactose intolerance   . Allergic rhinitis   . Long term (current) use of anticoagulants 11/23/2012    Long-term anticoagulation with warfarin for stroke risk reduction related to AF  . Gait disorder   . Chronic venous insufficiency 01/12/2013  . Dementia with behavioral disturbance 12/29/13   Past Surgical History  Procedure Laterality Date  . Cataract extraction w/ intraocular  lens  implant, bilateral    . Hernia repair Right 1970s  . Retinal detachment surgery Right 2009  . Knee surgery Right 2000  . Mass excision Left 07/30/2013    Procedure: EXCISION OF BASAL CELL CARCINOMA  LEFT FOREHEAD ;  Surgeon: Irene Limbo, MD;  Location: [facility];  Service: Plastics;  Laterality: Left;   No family history on file. History  Substance Use Topics  . Smoking status: Former Smoker    Quit date: 07/24/1983  . Smokeless tobacco: Former Systems developer    Quit date: 11/18/1970  . Alcohol Use: Yes     Comment: occasionally    Review of Systems  A complete 10 system review of systems was obtained and all systems are negative except as noted in the HPI and PMH.   Allergies  Penicillins; Bee venom; and Risperidone and related  Home Medications   Prior to Admission medications   Medication Sig Start Date End Date Taking? Authorizing Provider  acetaminophen (TYLENOL) 325 MG tablet Take 2 tablets (650 mg total) by mouth 3 (three) times daily. 03/10/13   Claudette Jeri Cos, NP  atorvastatin (LIPITOR) 10 MG tablet Take 10 mg by mouth daily.    Historical Provider, MD  divalproex (DEPAKOTE SPRINKLE) 125 MG capsule Take 250 mg by mouth 2 (two) times daily. 12/16/13   Claudette Jeri Cos, NP  furosemide (LASIX) 40 MG tablet Take 80 mg by mouth daily. Take one tablet daily for edema 11/02/13   Claudette T  Krell, NP  ipratropium (ATROVENT) 0.06 % nasal spray Place 2 sprays into both nostrils 4 (four) times daily.    Historical Provider, MD  ketoconazole (NIZORAL) 2 % shampoo Apply 1 application topically. Wash head, face, ears, three times a week, Mon, [place], Sat    Historical Provider, MD  LORazepam (ATIVAN) 0.5 MG tablet Take 0.5 mg by mouth 2 (two) times daily.  02/02/13   Estill Dooms, MD  nitroGLYCERIN (NITROSTAT) 0.4 MG SL tablet Place 0.4 mg under the tongue every 5 (five) minutes as needed for chest pain.    Historical Provider, MD  omeprazole (PRILOSEC) 20 MG capsule  Take 20 mg by mouth daily.    Historical Provider, MD  QUEtiapine (SEROQUEL) 25 MG tablet Take 25 mg by mouth at bedtime.     Historical Provider, MD  spironolactone (ALDACTONE) 25 MG tablet Take 1 tablet (25 mg total) by mouth daily. Patient not taking: Reported on 07/06/2014 05/20/13   Mardene Celeste, NP  warfarin (COUMADIN) 4 MG tablet Take 4 mg by mouth daily.     Historical Provider, MD   There were no vitals taken for this visit. Physical Exam  Constitutional: He appears well-developed and well-nourished. No distress.  Patient is for the most part nonverbal. He is semi-awake and responds to noxious stimuli.   HENT:  Head: Normocephalic and atraumatic.  Mouth/Throat: Oropharynx is clear and moist. No oropharyngeal exudate.  Eyes: EOM are normal. Pupils are equal, round, and reactive to light.  Neck: Normal range of motion. Neck supple.  Cardiovascular: Normal rate, regular rhythm and normal heart sounds.  Exam reveals no gallop and no friction rub.   No murmur heard. Pulmonary/Chest: Effort normal. No respiratory distress. He has no wheezes. He has rales.  Patient has a rattling cough with rales in the bases bilaterally.   Abdominal: Soft. There is no tenderness. There is no rebound and no guarding.  Musculoskeletal: Normal range of motion. He exhibits edema.  There is 3+ BLE edema   Neurological:  Patient is semi-alert and withdraws to noxious stimuli. He moves all four extremities but does not follow commands.   Skin: Skin is warm and dry. No rash noted.  Psychiatric: He has a normal mood and affect. His behavior is normal.  Nursing note and vitals reviewed.   ED Course  Procedures   DIAGNOSTIC STUDIES: Oxygen Saturation is 94% on RA, low by my interpretation.    COORDINATION OF CARE: 11:44 PM Discussed treatment plan with pt at bedside and pt agreed to plan.   Labs Review Labs Reviewed - No data to display  Imaging Review No results found.   EKG  Interpretation   Date/Time:  Thursday September 01 2014 23:39:54 EST Ventricular Rate:  91 PR Interval:    QRS Duration: 134 QT Interval:  403 QTC Calculation: 496 R Axis:   6 Text Interpretation:  Atrial fibrillation Ventricular premature complex  Left bundle branch block Confirmed by DELOS  MD, Emie Sommerfeld ([id number]) on  09/02/2014 3:40:18 AM      MDM   Final diagnoses:  None    Patient is an 81 year old male with history of dementia and atrial fibrillation for which she is on Coumadin. He is brought from the Alzheimer's care unit for evaluation of possible pneumonia. He has had chest congestion and chest x-ray there are suggested pneumonia. Patient is able to add no useful history due to his severe dementia.  On exam, his vitals are stable, he is afebrile, and he  is not hypoxic. His lungs do have slight rales in the bases with some rattling in his chest. His workup reveals chest x-ray with no obvious infiltrate or consolidation. His EKG shows atrial fibrillation with negative troponin and coagulation studies show that he is adequately anticoagulated. He has no significant oxygen requirement and appears quite comfortable. At this point, I feel as though he is appropriate for discharge. Due to the congestion and rattling in his chest, he will be prescribed Levaquin. He is to be returned to the ER if his symptoms substantially worsen or change.   I personally performed the services described in this documentation, which was scribed in my presence. The recorded information has been reviewed and is accurate.       Veryl Speak, MD 09/02/14 [phone]

## 2014-09-01 NOTE — ED Notes (Signed)
Per ems-- pt from [facility]- pt had cxr which showed pneumonia- therefore pt sent here for evaluation. Pt with decreased mental status. Pt responds to verbal stimuli but normally more responsive. Pt presents with non productive cough and rhonchi/wheezing noted in lung fields. Pt also has abdominal distention and pedal edema- per norm. Pt has DNR and MOST form. 20 IV F fa. bp 130/90 hr 90, 97% on 3 L. Pt has been wearing o2 for past few days.

## 2014-09-02 ENCOUNTER — Emergency Department (HOSPITAL_COMMUNITY): Payer: Medicare Other

## 2014-09-02 DIAGNOSIS — I517 Cardiomegaly: Secondary | ICD-10-CM | POA: Diagnosis not present

## 2014-09-02 DIAGNOSIS — J189 Pneumonia, unspecified organism: Secondary | ICD-10-CM | POA: Diagnosis not present

## 2014-09-02 DIAGNOSIS — R05 Cough: Secondary | ICD-10-CM | POA: Diagnosis not present

## 2014-09-02 DIAGNOSIS — J9811 Atelectasis: Secondary | ICD-10-CM | POA: Diagnosis not present

## 2014-09-02 LAB — CBC WITH DIFFERENTIAL/PLATELET
BAND NEUTROPHILS: 0 % (ref 0–10)
BASOS PCT: 0 % (ref 0–1)
Basophils Absolute: 0 10*3/uL (ref 0.0–0.1)
Blasts: 0 %
EOS ABS: 0 10*3/uL (ref 0.0–0.7)
EOS PCT: 0 % (ref 0–5)
HCT: 47.1 % (ref 39.0–52.0)
Hemoglobin: 14.5 g/dL (ref 13.0–17.0)
LYMPHS ABS: 3.9 10*3/uL (ref 0.7–4.0)
Lymphocytes Relative: 35 % (ref 12–46)
MCH: 30.3 pg (ref 26.0–34.0)
MCHC: 30.8 g/dL (ref 30.0–36.0)
MCV: 98.3 fL (ref 78.0–100.0)
METAMYELOCYTES PCT: 0 %
MONO ABS: 1.4 10*3/uL — AB (ref 0.1–1.0)
MONOS PCT: 12 % (ref 3–12)
Myelocytes: 0 %
NEUTROS ABS: 6 10*3/uL (ref 1.7–7.7)
Neutrophils Relative %: 53 % (ref 43–77)
PLATELETS: 208 10*3/uL (ref 150–400)
Promyelocytes Absolute: 0 %
RBC: 4.79 MIL/uL (ref 4.22–5.81)
RDW: 14.5 % (ref 11.5–15.5)
WBC: 11.2 10*3/uL — AB (ref 4.0–10.5)
nRBC: 0 /100 WBC

## 2014-09-02 LAB — COMPREHENSIVE METABOLIC PANEL
ALT: 18 U/L (ref 0–53)
AST: 24 U/L (ref 0–37)
Albumin: 2.7 g/dL — ABNORMAL LOW (ref 3.5–5.2)
Alkaline Phosphatase: 126 U/L — ABNORMAL HIGH (ref 39–117)
Anion gap: 11 (ref 5–15)
BILIRUBIN TOTAL: 0.8 mg/dL (ref 0.3–1.2)
BUN: 48 mg/dL — ABNORMAL HIGH (ref 6–23)
CHLORIDE: 111 mmol/L (ref 96–112)
CO2: 29 mmol/L (ref 19–32)
Calcium: 8.7 mg/dL (ref 8.4–10.5)
Creatinine, Ser: 1.56 mg/dL — ABNORMAL HIGH (ref 0.50–1.35)
GFR calc Af Amer: 44 mL/min — ABNORMAL LOW (ref >90)
GFR, EST NON AFRICAN AMERICAN: 38 mL/min — AB (ref >90)
Glucose, Bld: 118 mg/dL — ABNORMAL HIGH (ref 70–99)
Potassium: 4 mmol/L (ref 3.5–5.1)
SODIUM: 151 mmol/L — AB (ref 135–145)
Total Protein: 7.2 g/dL (ref 6.0–8.3)

## 2014-09-02 LAB — I-STAT TROPONIN, ED: Troponin i, poc: 0.02 ng/mL (ref 0.00–0.08)

## 2014-09-02 LAB — PROTIME-INR
INR: 3.8 — ABNORMAL HIGH (ref 0.00–1.49)
Prothrombin Time: 37.7 seconds — ABNORMAL HIGH (ref 11.6–15.2)

## 2014-09-02 LAB — BRAIN NATRIURETIC PEPTIDE: B Natriuretic Peptide: 123.8 pg/mL — ABNORMAL HIGH (ref 0.0–100.0)

## 2014-09-02 MED ORDER — LEVOFLOXACIN 750 MG PO TABS: 750.0000 mg | ORAL_TABLET | Freq: Every day | ORAL | Status: DC

## 2014-09-02 MED ORDER — LEVOFLOXACIN 750 MG PO TABS
750.0000 mg | ORAL_TABLET | Freq: Once | ORAL | Status: AC
  Administered 2014-09-02: 750 mg via ORAL
  Filled 2014-09-02: qty 1

## 2014-09-02 NOTE — ED Notes (Signed)
[facility] paged for transport 

## 2014-09-02 NOTE — Discharge Instructions (Signed)
Levaquin as prescribed.  Return to the emergency department for low oxygen saturations, respiratory difficulty, or other new and concerning symptoms.   Cough, Adult  A cough is a reflex that helps clear your throat and airways. It can help heal the body or may be a reaction to an irritated airway. A cough may only last 2 or 3 weeks (acute) or may last more than 8 weeks (chronic).  CAUSES Acute cough:  Viral or bacterial infections. Chronic cough:  Infections.  Allergies.  Asthma.  Post-nasal drip.  Smoking.  Heartburn or acid reflux.  Some medicines.  Chronic lung problems (COPD).  Cancer. SYMPTOMS   Cough.  Fever.  Chest pain.  Increased breathing rate.  High-pitched whistling sound when breathing (wheezing).  Colored mucus that you cough up (sputum). TREATMENT   A bacterial cough may be treated with antibiotic medicine.  A viral cough must run its course and will not respond to antibiotics.  Your caregiver may recommend other treatments if you have a chronic cough. HOME CARE INSTRUCTIONS   Only take over-the-counter or prescription medicines for pain, discomfort, or fever as directed by your caregiver. Use cough suppressants only as directed by your caregiver.  Use a cold steam vaporizer or humidifier in your bedroom or home to help loosen secretions.  Sleep in a semi-upright position if your cough is worse at night.  Rest as needed.  Stop smoking if you smoke. SEEK IMMEDIATE MEDICAL CARE IF:   You have pus in your sputum.  Your cough starts to worsen.  You cannot control your cough with suppressants and are losing sleep.  You begin coughing up blood.  You have difficulty breathing.  You develop pain which is getting worse or is uncontrolled with medicine.  You have a fever. MAKE SURE YOU:   Understand these instructions.  Will watch your condition.  Will get help right away if you are not doing well or get worse. Document Released:  12/21/2010 Document Revised: 09/16/2011 Document Reviewed: 12/21/2010 [facility] Patient Information 2015 [place], [place]. This information is not intended to replace advice given to you by your health care provider. Make sure you discuss any questions you have with your health care provider.

## 2014-09-02 NOTE — ED Notes (Signed)
[facility] CALLED  °

## 2014-09-03 DIAGNOSIS — Z7901 Long term (current) use of anticoagulants: Secondary | ICD-10-CM | POA: Diagnosis not present

## 2014-09-04 DIAGNOSIS — Z7901 Long term (current) use of anticoagulants: Secondary | ICD-10-CM | POA: Diagnosis not present

## 2014-09-04 LAB — PROTIME-INR: Protime: 37.2 seconds — AB (ref 10.0–13.8)

## 2014-09-04 LAB — POCT INR: INR: 3.8 — AB (ref <=1.1)

## 2014-09-05 ENCOUNTER — Non-Acute Institutional Stay (SKILLED_NURSING_FACILITY): Payer: Medicare Other | Admitting: Adult Health

## 2014-09-05 DIAGNOSIS — E860 Dehydration: Secondary | ICD-10-CM

## 2014-09-05 DIAGNOSIS — I87002 Postthrombotic syndrome without complications of left lower extremity: Secondary | ICD-10-CM | POA: Diagnosis not present

## 2014-09-05 DIAGNOSIS — R269 Unspecified abnormalities of gait and mobility: Secondary | ICD-10-CM | POA: Diagnosis not present

## 2014-09-05 DIAGNOSIS — I482 Chronic atrial fibrillation, unspecified: Secondary | ICD-10-CM

## 2014-09-05 DIAGNOSIS — R1314 Dysphagia, pharyngoesophageal phase: Secondary | ICD-10-CM | POA: Diagnosis not present

## 2014-09-05 DIAGNOSIS — G309 Alzheimer's disease, unspecified: Secondary | ICD-10-CM | POA: Diagnosis not present

## 2014-09-05 DIAGNOSIS — J690 Pneumonitis due to inhalation of food and vomit: Secondary | ICD-10-CM | POA: Diagnosis not present

## 2014-09-05 DIAGNOSIS — F0391 Unspecified dementia with behavioral disturbance: Secondary | ICD-10-CM | POA: Diagnosis not present

## 2014-09-05 DIAGNOSIS — R1312 Dysphagia, oropharyngeal phase: Secondary | ICD-10-CM | POA: Diagnosis not present

## 2014-09-05 NOTE — Progress Notes (Signed)
Patient ID: Bryan [name], male   DOB: 11/14/1922, 81 y.o.   MRN: [id number]  Location:  [facility]   Code Status:  DNR, no hospitalizations  Chief Complaint  Patient presents with  . Acute Visit    f/u ER visit for dyspnea    HPI:  81 y.o. male  residing at [facility], memory care section.I am here to evaluate after an ER visit on 09/01/14 due dyspnea. Bryan [name] has a hx of dementia with overall recent functional decline. He is currently requiring help with all ADL's and is a hoyer lift. During his ER visit he was diagnosed with pneumonia, and placed on Levaquin for 7 days. His chest xray was unrevealing but he had decreased 02 sats and a congested cough. He was also quite dry.  His Na during the visit was 151. He was continued on Lasix 80mg  daily due to significant fluid overload in his hands. He appears dry today with shriveling of his hands but staff reports that he has been eating better. He was placed on NTL due to concern for dysphagia and is currently being evaluated by speech. He still is dependent on oxygen at 2L and desats when removed. He has a congested cough and is more lethargic than usual per the staff. He has not had a fever over the weekend but was 100 degrees this am.   Review of Systems:  ROS  NA due to dementia  Medications: Patient's Medications  New Prescriptions   No medications on file  Previous Medications   ACETAMINOPHEN (TYLENOL) 325 MG TABLET    Take 2 tablets (650 mg total) by mouth 3 (three) times daily.   ATORVASTATIN (LIPITOR) 10 MG TABLET    Take 10 mg by mouth daily.   DIVALPROEX (DEPAKOTE SPRINKLE) 125 MG CAPSULE    Take 250 mg by mouth 2 (two) times daily.   FUROSEMIDE (LASIX) 40 MG TABLET    Take 80 mg by mouth daily. Take one tablet daily for edema   IPRATROPIUM (ATROVENT) 0.06 % NASAL SPRAY    Place 2 sprays into both nostrils 4 (four) times daily.   KETOCONAZOLE (NIZORAL) 2 % SHAMPOO    Apply 1  application topically. Wash head, face, ears, three times a week, Mon, Thur, Sat   LEVOFLOXACIN (LEVAQUIN) 750 MG TABLET    Take 1 tablet (750 mg total) by mouth daily. X 7 days   LORAZEPAM (ATIVAN) 0.5 MG TABLET    Take 0.5 mg by mouth 2 (two) times daily.    NITROGLYCERIN (NITROSTAT) 0.4 MG SL TABLET    Place 0.4 mg under the tongue every 5 (five) minutes as needed for chest pain.   OMEPRAZOLE (PRILOSEC) 20 MG CAPSULE    Take 20 mg by mouth daily.   QUETIAPINE (SEROQUEL) 25 MG TABLET    Take 25 mg by mouth at bedtime.    SPIRONOLACTONE (ALDACTONE) 25 MG TABLET    Take 1 tablet (25 mg total) by mouth daily.   WARFARIN (COUMADIN) 4 MG TABLET    Take 4 mg by mouth daily.   Modified Medications   No medications on file  Discontinued Medications   No medications on file    Physical Exam: Filed Vitals:   09/05/14 1046  BP: 107/65  Pulse: 87  Temp: 100 F (37.8 C)  Resp: 20  Weight: 232 lb (105.235 kg)  SpO2: 96%   Physical Exam  Constitutional: No distress.  HENT:  Head: Normocephalic.  Would not allow me to exam his mouth  Eyes: Pupils are equal, round, and reactive to light.  Neck: No JVD present. No tracheal deviation present. No thyromegaly present.  Cardiovascular:  irreg irreg; 3+ pitting edema  Pulmonary/Chest: Effort normal. No respiratory distress.  Decreased throughout with congested cough  Abdominal: Soft. Bowel sounds are normal. He exhibits no distension. There is no tenderness.  Musculoskeletal: Normal range of motion.  Lymphadenopathy:    He has no cervical adenopathy.  Neurological:  Is arousable, but does not stay awake for long; answers a few questions, but changes responses frequently  Skin: Skin is warm and dry. He is not diaphoretic.  Psychiatric:  Lethargic, intermittently following commands     Labs/Studies reviewed:  CXR 09/01/14 reviewed IMPRESSION: Shallow inspiration with atelectasis in the lung bases. Chronic bronchitic changes. Mild cardiac  enlargement. No evidence of active Consolidation.  2D echo 06/2015 Study Conclusions  - Left ventricle: The cavity size was normal. There was mild focal basal hypertrophy of the septum. Systolic function was normal. The estimated ejection fraction was in the range of 50% to 55%. Although no diagnostic regional wall motion abnormality was identified, this possibility cannot be completely excluded on the basis of this study. - Aortic valve: Cusp separation was reduced. - Mitral valve: Calcified annulus. Mildly thickened leaflets . - Left atrium: The atrium was mildly dilated.  Basic Metabolic Panel:  Recent Labs  06/27/14 07/19/14 09/01/14 2352  NA 140 143 151*  K 4.5 3.6 4.0  CL  --   --  111  CO2  --   --  29  GLUCOSE  --   --  118*  BUN 39* 38* 48*  CREATININE 1.4* 1.2 1.56*  CALCIUM  --   --  8.7    CBC:  Recent Labs  02/22/14 06/27/14 09/01/14 2352  WBC 7.2 6.9 11.2*  NEUTROABS  --   --  6.0  HGB 13.5 13.8 14.5  HCT 40* 40* 47.1  MCV  --   --  98.3  PLT 240 187 208   Lab Results  Component Value Date   INR 3.8* 09/04/2014   INR 3.80* 09/01/2014   INR 2.2* 07/19/2014   PROTIME 37.2* 09/04/2014   PROTIME 24.6* 07/19/2014   PROTIME 25.9* 05/17/2014     Assessment/Plan  1. Aspiration pneumonia, unspecified aspiration pneumonia type Continue Levaquin. Begin xopenex and atrovent nebs TID for 48 hrs then prn SOB/dyspnea. Add Florastor 1 cap BID for 1 week  2. Dysphagia, pharyngoesophageal phase Recently changed to NTL and followed by ST  3. Chronic atrial fibrillation Coumadin on hold due to elevated INR, recheck in am  4. Dehydration Already received lasix today. Will check BMP in am. Hold am lasix til results are back. Encourage fluids.   Cindi Carbon, ANP [facility] [phone]

## 2014-09-06 DIAGNOSIS — Z7901 Long term (current) use of anticoagulants: Secondary | ICD-10-CM | POA: Diagnosis not present

## 2014-09-06 DIAGNOSIS — R2681 Unsteadiness on feet: Secondary | ICD-10-CM | POA: Diagnosis not present

## 2014-09-06 DIAGNOSIS — R2689 Other abnormalities of gait and mobility: Secondary | ICD-10-CM | POA: Diagnosis not present

## 2014-09-06 DIAGNOSIS — R269 Unspecified abnormalities of gait and mobility: Secondary | ICD-10-CM | POA: Diagnosis not present

## 2014-09-06 DIAGNOSIS — F0391 Unspecified dementia with behavioral disturbance: Secondary | ICD-10-CM | POA: Diagnosis not present

## 2014-09-06 DIAGNOSIS — R1312 Dysphagia, oropharyngeal phase: Secondary | ICD-10-CM | POA: Diagnosis not present

## 2014-09-06 DIAGNOSIS — R799 Abnormal finding of blood chemistry, unspecified: Secondary | ICD-10-CM | POA: Diagnosis not present

## 2014-09-06 DIAGNOSIS — G309 Alzheimer's disease, unspecified: Secondary | ICD-10-CM | POA: Diagnosis not present

## 2014-09-07 DIAGNOSIS — R269 Unspecified abnormalities of gait and mobility: Secondary | ICD-10-CM | POA: Diagnosis not present

## 2014-09-07 DIAGNOSIS — G309 Alzheimer's disease, unspecified: Secondary | ICD-10-CM | POA: Diagnosis not present

## 2014-09-07 DIAGNOSIS — E860 Dehydration: Secondary | ICD-10-CM | POA: Diagnosis not present

## 2014-09-07 DIAGNOSIS — R2681 Unsteadiness on feet: Secondary | ICD-10-CM | POA: Diagnosis not present

## 2014-09-07 DIAGNOSIS — R1312 Dysphagia, oropharyngeal phase: Secondary | ICD-10-CM | POA: Diagnosis not present

## 2014-09-07 DIAGNOSIS — R2689 Other abnormalities of gait and mobility: Secondary | ICD-10-CM | POA: Diagnosis not present

## 2014-09-07 DIAGNOSIS — F0391 Unspecified dementia with behavioral disturbance: Secondary | ICD-10-CM | POA: Diagnosis not present

## 2014-09-08 DIAGNOSIS — R2689 Other abnormalities of gait and mobility: Secondary | ICD-10-CM | POA: Diagnosis not present

## 2014-09-08 DIAGNOSIS — R1312 Dysphagia, oropharyngeal phase: Secondary | ICD-10-CM | POA: Diagnosis not present

## 2014-09-08 DIAGNOSIS — I4892 Unspecified atrial flutter: Secondary | ICD-10-CM | POA: Diagnosis not present

## 2014-09-08 DIAGNOSIS — F0391 Unspecified dementia with behavioral disturbance: Secondary | ICD-10-CM | POA: Diagnosis not present

## 2014-09-08 DIAGNOSIS — Z7902 Long term (current) use of antithrombotics/antiplatelets: Secondary | ICD-10-CM | POA: Diagnosis not present

## 2014-09-08 DIAGNOSIS — G309 Alzheimer's disease, unspecified: Secondary | ICD-10-CM | POA: Diagnosis not present

## 2014-09-08 DIAGNOSIS — R2681 Unsteadiness on feet: Secondary | ICD-10-CM | POA: Diagnosis not present

## 2014-09-08 DIAGNOSIS — R269 Unspecified abnormalities of gait and mobility: Secondary | ICD-10-CM | POA: Diagnosis not present

## 2014-09-09 DIAGNOSIS — N184 Chronic kidney disease, stage 4 (severe): Secondary | ICD-10-CM | POA: Diagnosis not present

## 2014-09-09 LAB — BASIC METABOLIC PANEL
BUN: 37 mg/dL — AB (ref 4–21)
Creatinine: 1.3 mg/dL (ref 0.6–1.3)
Glucose: 109 mg/dL
Potassium: 4.1 mmol/L (ref 3.4–5.3)
SODIUM: 148 mmol/L — AB (ref 137–147)

## 2014-09-10 DIAGNOSIS — R2681 Unsteadiness on feet: Secondary | ICD-10-CM | POA: Diagnosis not present

## 2014-09-10 DIAGNOSIS — R269 Unspecified abnormalities of gait and mobility: Secondary | ICD-10-CM | POA: Diagnosis not present

## 2014-09-10 DIAGNOSIS — F0391 Unspecified dementia with behavioral disturbance: Secondary | ICD-10-CM | POA: Diagnosis not present

## 2014-09-10 DIAGNOSIS — R2689 Other abnormalities of gait and mobility: Secondary | ICD-10-CM | POA: Diagnosis not present

## 2014-09-10 DIAGNOSIS — G309 Alzheimer's disease, unspecified: Secondary | ICD-10-CM | POA: Diagnosis not present

## 2014-09-10 DIAGNOSIS — R1312 Dysphagia, oropharyngeal phase: Secondary | ICD-10-CM | POA: Diagnosis not present

## 2014-09-12 DIAGNOSIS — R1312 Dysphagia, oropharyngeal phase: Secondary | ICD-10-CM | POA: Diagnosis not present

## 2014-09-12 DIAGNOSIS — R269 Unspecified abnormalities of gait and mobility: Secondary | ICD-10-CM | POA: Diagnosis not present

## 2014-09-12 DIAGNOSIS — F0391 Unspecified dementia with behavioral disturbance: Secondary | ICD-10-CM | POA: Diagnosis not present

## 2014-09-12 DIAGNOSIS — G309 Alzheimer's disease, unspecified: Secondary | ICD-10-CM | POA: Diagnosis not present

## 2014-09-12 DIAGNOSIS — R2689 Other abnormalities of gait and mobility: Secondary | ICD-10-CM | POA: Diagnosis not present

## 2014-09-12 DIAGNOSIS — N189 Chronic kidney disease, unspecified: Secondary | ICD-10-CM | POA: Diagnosis not present

## 2014-09-12 DIAGNOSIS — R2681 Unsteadiness on feet: Secondary | ICD-10-CM | POA: Diagnosis not present

## 2014-09-12 DIAGNOSIS — N184 Chronic kidney disease, stage 4 (severe): Secondary | ICD-10-CM | POA: Diagnosis not present

## 2014-09-12 LAB — BASIC METABOLIC PANEL
BUN: 38 mg/dL — AB (ref 4–21)
CREATININE: 1.2 mg/dL (ref 0.6–1.3)
Glucose: 99 mg/dL
Potassium: 4 mmol/L (ref 3.4–5.3)
SODIUM: 145 mmol/L (ref 137–147)

## 2014-09-13 ENCOUNTER — Non-Acute Institutional Stay (SKILLED_NURSING_FACILITY): Payer: Medicare Other | Admitting: Adult Health

## 2014-09-13 ENCOUNTER — Encounter: Payer: Self-pay | Admitting: Adult Health

## 2014-09-13 DIAGNOSIS — J690 Pneumonitis due to inhalation of food and vomit: Secondary | ICD-10-CM | POA: Diagnosis not present

## 2014-09-13 DIAGNOSIS — R1312 Dysphagia, oropharyngeal phase: Secondary | ICD-10-CM | POA: Diagnosis not present

## 2014-09-13 DIAGNOSIS — N183 Chronic kidney disease, stage 3 unspecified: Secondary | ICD-10-CM

## 2014-09-13 DIAGNOSIS — E860 Dehydration: Secondary | ICD-10-CM

## 2014-09-13 DIAGNOSIS — R2681 Unsteadiness on feet: Secondary | ICD-10-CM | POA: Diagnosis not present

## 2014-09-13 DIAGNOSIS — R269 Unspecified abnormalities of gait and mobility: Secondary | ICD-10-CM | POA: Diagnosis not present

## 2014-09-13 DIAGNOSIS — G309 Alzheimer's disease, unspecified: Secondary | ICD-10-CM | POA: Diagnosis not present

## 2014-09-13 DIAGNOSIS — F0391 Unspecified dementia with behavioral disturbance: Secondary | ICD-10-CM | POA: Diagnosis not present

## 2014-09-13 DIAGNOSIS — R609 Edema, unspecified: Secondary | ICD-10-CM | POA: Diagnosis not present

## 2014-09-13 DIAGNOSIS — R2689 Other abnormalities of gait and mobility: Secondary | ICD-10-CM | POA: Diagnosis not present

## 2014-09-13 NOTE — Progress Notes (Signed)
Patient ID: Bryan [name], male   DOB: 05-03-1923, 81 y.o.   MRN: [id number]  Location:  [facility] ([place]   Code Status:  DNR, no hospitalizations  Chief Complaint  Patient presents with  . Acute Visit    f/u pna, dehydrated    HPI:  81 y.o. male  residing at [facility], memory care section. I am here to follow up regarding pneumonia and dehydration. He was seen in the ER on for pneumonia placed on Levaquin on 09/01/14. He has completed a 7 day course. His CXR at that time was unremarkable but he had a congested cough, fever, and desaturation. He was previously oxygen dependent but is now on room air. He did have one instance of requiring oxygen on 09/11/14 but not since then. He has not received an prn breathing tx and has been afebrile. There was concern for aspiration by ST and he was placed on NTL. He is allow to have thin liquids after oral care (he signed a waiver).   He also experienced dehydration with a sodium of 158 on 09/05/14. His lasix was held and he was given 4L of D5W. This was discontinued due to wheezing and since then his sodium has trended downward to 145 and he is back on Lasix.  Today the staff reports that he is not drinking water consistently and is more lethargic than usual. He remains otherwise stable.  Review of Systems:  ROS  NA due to dementia  Medications: Patient's Medications  New Prescriptions   No medications on file  Previous Medications   ACETAMINOPHEN (TYLENOL) 325 MG TABLET    Take 2 tablets (650 mg total) by mouth 3 (three) times daily.   ATORVASTATIN (LIPITOR) 10 MG TABLET    Take 10 mg by mouth daily.   DIVALPROEX (DEPAKOTE SPRINKLE) 125 MG CAPSULE    Take 250 mg by mouth 2 (two) times daily.   FUROSEMIDE (LASIX) 40 MG TABLET    Take 80 mg by mouth daily. Take one tablet daily for edema   IPRATROPIUM (ATROVENT) 0.06 % NASAL SPRAY    Place 2 sprays into both nostrils 4 (four) times daily.   KETOCONAZOLE  (NIZORAL) 2 % SHAMPOO    Apply 1 application topically. Wash head, face, ears, three times a week, Mon, Thur, Sat   LEVOFLOXACIN (LEVAQUIN) 750 MG TABLET    Take 1 tablet (750 mg total) by mouth daily. X 7 days   LORAZEPAM (ATIVAN) 0.5 MG TABLET    Take 0.5 mg by mouth 2 (two) times daily.    NITROGLYCERIN (NITROSTAT) 0.4 MG SL TABLET    Place 0.4 mg under the tongue every 5 (five) minutes as needed for chest pain.   OMEPRAZOLE (PRILOSEC) 20 MG CAPSULE    Take 20 mg by mouth daily.   QUETIAPINE (SEROQUEL) 25 MG TABLET    Take 25 mg by mouth at bedtime.    SPIRONOLACTONE (ALDACTONE) 25 MG TABLET    Take 1 tablet (25 mg total) by mouth daily.   WARFARIN (COUMADIN) 4 MG TABLET    Take 4 mg by mouth daily.   Modified Medications   No medications on file  Discontinued Medications   No medications on file    Physical Exam: Filed Vitals:   09/13/14 1146  BP: 106/66  Pulse: 72  Temp: 97.4 F (36.3 C)  Resp: 16  Weight: 236 lb (107.049 kg)  SpO2: 100%   Physical Exam  Constitutional: No distress.  HENT:  Head: Normocephalic.  Would not allow me to exam his mouth  Eyes: Pupils are equal, round, and reactive to light.  Neck: No JVD present. No tracheal deviation present. No thyromegaly present.  Cardiovascular:  irreg irreg; 3+ pitting edema  Pulmonary/Chest: Effort normal. No respiratory distress.  Decreased throughout   Abdominal: Soft. Bowel sounds are normal. He exhibits no distension. There is no tenderness.  Musculoskeletal: Normal range of motion.  Lymphadenopathy:    He has no cervical adenopathy.  Neurological:  Can not stay awake to f/c or answer q's  Skin: Skin is warm and dry. He is not diaphoretic.  Mild skin tenting noted     Labs/Studies reviewed:  CXR 09/01/14 reviewed IMPRESSION: Shallow inspiration with atelectasis in the lung bases. Chronic bronchitic changes. Mild cardiac enlargement. No evidence of active Consolidation.  2D echo 06/2015 Study  Conclusions  - Left ventricle: The cavity size was normal. There was mild focal basal hypertrophy of the septum. Systolic function was normal. The estimated ejection fraction was in the range of 50% to 55%. Although no diagnostic regional wall motion abnormality was identified, this possibility cannot be completely excluded on the basis of this study. - Aortic valve: Cusp separation was reduced. - Mitral valve: Calcified annulus. Mildly thickened leaflets . - Left atrium: The atrium was mildly dilated.  Basic Metabolic Panel:  Recent Labs  09/01/14 2352 09/09/14 09/12/14  NA 151* 148* 145  K 4.0 4.1 4.0  CL 111  --   --   CO2 29  --   --   GLUCOSE 118*  --   --   BUN 48* 37* 38*  CREATININE 1.56* 1.3 1.2  CALCIUM 8.7  --   --     CBC:  Recent Labs  02/22/14 06/27/14 09/01/14 2352  WBC 7.2 6.9 11.2*  NEUTROABS  --   --  6.0  HGB 13.5 13.8 14.5  HCT 40* 40* 47.1  MCV  --   --  98.3  PLT 240 187 208   Lab Results  Component Value Date   INR 3.8* 09/04/2014   INR 3.80* 09/01/2014   INR 2.2* 07/19/2014   PROTIME 37.2* 09/04/2014   PROTIME 24.6* 07/19/2014   PROTIME 25.9* 05/17/2014   Wt Readings from Last 3 Encounters:  09/13/14 236 lb (107.049 kg)  09/05/14 232 lb (105.235 kg)  08/09/14 237 lb 6.4 oz (107.684 kg)    Assessment/Plan  1. Aspiration pneumonia, unspecified aspiration pneumonia type Resolving, continue nebs prn and NTL diet.  2. Dehydration Improved per labs yesterday, continue Lasix. Will recheck BMP due to decrease mental status and decreased intake per sitter.   3. Edema His weight has remained stable over the past week. Continue to monitor weight, see above  4. CKD stage 3 Improved   Cindi Carbon, ANP [facility] [phone]

## 2014-09-14 DIAGNOSIS — R269 Unspecified abnormalities of gait and mobility: Secondary | ICD-10-CM | POA: Diagnosis not present

## 2014-09-14 DIAGNOSIS — F0391 Unspecified dementia with behavioral disturbance: Secondary | ICD-10-CM | POA: Diagnosis not present

## 2014-09-14 DIAGNOSIS — R2681 Unsteadiness on feet: Secondary | ICD-10-CM | POA: Diagnosis not present

## 2014-09-14 DIAGNOSIS — G309 Alzheimer's disease, unspecified: Secondary | ICD-10-CM | POA: Diagnosis not present

## 2014-09-14 DIAGNOSIS — N189 Chronic kidney disease, unspecified: Secondary | ICD-10-CM | POA: Diagnosis not present

## 2014-09-14 DIAGNOSIS — R1312 Dysphagia, oropharyngeal phase: Secondary | ICD-10-CM | POA: Diagnosis not present

## 2014-09-14 DIAGNOSIS — R2689 Other abnormalities of gait and mobility: Secondary | ICD-10-CM | POA: Diagnosis not present

## 2014-09-14 DIAGNOSIS — Z7901 Long term (current) use of anticoagulants: Secondary | ICD-10-CM | POA: Diagnosis not present

## 2014-09-15 DIAGNOSIS — Z7901 Long term (current) use of anticoagulants: Secondary | ICD-10-CM | POA: Diagnosis not present

## 2014-09-15 DIAGNOSIS — F0391 Unspecified dementia with behavioral disturbance: Secondary | ICD-10-CM | POA: Diagnosis not present

## 2014-09-15 DIAGNOSIS — R1312 Dysphagia, oropharyngeal phase: Secondary | ICD-10-CM | POA: Diagnosis not present

## 2014-09-15 DIAGNOSIS — R2681 Unsteadiness on feet: Secondary | ICD-10-CM | POA: Diagnosis not present

## 2014-09-15 DIAGNOSIS — G309 Alzheimer's disease, unspecified: Secondary | ICD-10-CM | POA: Diagnosis not present

## 2014-09-15 DIAGNOSIS — R269 Unspecified abnormalities of gait and mobility: Secondary | ICD-10-CM | POA: Diagnosis not present

## 2014-09-15 DIAGNOSIS — R2689 Other abnormalities of gait and mobility: Secondary | ICD-10-CM | POA: Diagnosis not present

## 2014-09-15 DIAGNOSIS — F0151 Vascular dementia with behavioral disturbance: Secondary | ICD-10-CM | POA: Diagnosis not present

## 2014-09-16 DIAGNOSIS — G309 Alzheimer's disease, unspecified: Secondary | ICD-10-CM | POA: Diagnosis not present

## 2014-09-16 DIAGNOSIS — R1312 Dysphagia, oropharyngeal phase: Secondary | ICD-10-CM | POA: Diagnosis not present

## 2014-09-16 DIAGNOSIS — F0391 Unspecified dementia with behavioral disturbance: Secondary | ICD-10-CM | POA: Diagnosis not present

## 2014-09-16 DIAGNOSIS — R269 Unspecified abnormalities of gait and mobility: Secondary | ICD-10-CM | POA: Diagnosis not present

## 2014-09-16 DIAGNOSIS — R2689 Other abnormalities of gait and mobility: Secondary | ICD-10-CM | POA: Diagnosis not present

## 2014-09-16 DIAGNOSIS — R2681 Unsteadiness on feet: Secondary | ICD-10-CM | POA: Diagnosis not present

## 2014-09-19 ENCOUNTER — Encounter: Payer: Self-pay | Admitting: Adult Health

## 2014-09-19 ENCOUNTER — Non-Acute Institutional Stay (SKILLED_NURSING_FACILITY): Payer: Medicare Other | Admitting: Adult Health

## 2014-09-19 DIAGNOSIS — F0391 Unspecified dementia with behavioral disturbance: Secondary | ICD-10-CM | POA: Diagnosis not present

## 2014-09-19 DIAGNOSIS — E870 Hyperosmolality and hypernatremia: Secondary | ICD-10-CM

## 2014-09-19 DIAGNOSIS — G309 Alzheimer's disease, unspecified: Secondary | ICD-10-CM | POA: Diagnosis not present

## 2014-09-19 DIAGNOSIS — R638 Other symptoms and signs concerning food and fluid intake: Secondary | ICD-10-CM

## 2014-09-19 DIAGNOSIS — R609 Edema, unspecified: Secondary | ICD-10-CM | POA: Diagnosis not present

## 2014-09-19 DIAGNOSIS — R2681 Unsteadiness on feet: Secondary | ICD-10-CM | POA: Diagnosis not present

## 2014-09-19 DIAGNOSIS — G308 Other Alzheimer's disease: Secondary | ICD-10-CM

## 2014-09-19 DIAGNOSIS — R2689 Other abnormalities of gait and mobility: Secondary | ICD-10-CM | POA: Diagnosis not present

## 2014-09-19 DIAGNOSIS — R269 Unspecified abnormalities of gait and mobility: Secondary | ICD-10-CM | POA: Diagnosis not present

## 2014-09-19 DIAGNOSIS — F0281 Dementia in other diseases classified elsewhere with behavioral disturbance: Secondary | ICD-10-CM

## 2014-09-19 DIAGNOSIS — F02818 Dementia in other diseases classified elsewhere, unspecified severity, with other behavioral disturbance: Secondary | ICD-10-CM

## 2014-09-19 DIAGNOSIS — R1312 Dysphagia, oropharyngeal phase: Secondary | ICD-10-CM | POA: Diagnosis not present

## 2014-09-19 LAB — BASIC METABOLIC PANEL
BUN: 36 mg/dL — AB (ref 4–21)
Creatinine: 1.2 mg/dL (ref 0.6–1.3)
GLUCOSE: 95 mg/dL
POTASSIUM: 3.8 mmol/L (ref 3.4–5.3)
Sodium: 150 mmol/L — AB (ref 137–147)

## 2014-09-19 NOTE — Progress Notes (Signed)
Patient ID: Bryan [name], male   DOB: 1922-11-07, 82 y.o.   MRN: [id number]  Location:  [facility]   Code Status:  DNR, no hospitalizations  Chief Complaint  Patient presents with  . Acute Visit    hypernatremia, excess sedation    HPI:  81 y.o. male  residing at [facility], memory care section. I am here to follow up regarding concerns of dehydration and excess sedation.  He was seen in the ER on for pneumonia placed on Levaquin on 09/01/14. He has completed a 7 day course. His CXR at that time was unremarkable but he had a congested cough, fever, and desaturation. He was previously oxygen dependent but is now on room air. He did have one instance of requiring oxygen on 09/11/14 but not since then. He has not received an prn breathing tx and has been afebrile. There was concern for aspiration by ST and he was placed on NTL. He is allow to have thin liquids after oral care (he signed a waiver).   He also experienced dehydration with a sodium of 158 on 09/05/14. His lasix was held and he was given 4L of D5W. This was discontinued due to wheezing and since then his sodium has trended downward to 145 and he is back on Lasix.  Today his sodium is 150. He did receive lasix this morning. He was seen by Dr. Casimiro Needle on 3/10 and seroquel was discontinued. He continues on ativan and depakote scheduled.  Overall he has experienced a functional decline in the past few months, requiring thickened liquids, more assistance with ADLs and decreased mobility. He is no longer on Aricept or Namenda due to this issue. The staff is concerned for poor fluid intake. His weight has remained fairly stable at 233 lbs.   Review of Systems:  ROS  NA due to dementia  Medications: Patient's Medications  New Prescriptions   No medications on file  Previous Medications   ACETAMINOPHEN (TYLENOL) 325 MG TABLET    Take 2 tablets (650 mg total) by mouth 3 (three) times daily.     ATORVASTATIN (LIPITOR) 10 MG TABLET    Take 10 mg by mouth daily.   DIVALPROEX (DEPAKOTE SPRINKLE) 125 MG CAPSULE    Take 250 mg by mouth 2 (two) times daily.   FUROSEMIDE (LASIX) 40 MG TABLET    Take 80 mg by mouth daily. Take one tablet daily for edema   IPRATROPIUM (ATROVENT) 0.06 % NASAL SPRAY    Place 2 sprays into both nostrils 4 (four) times daily.   KETOCONAZOLE (NIZORAL) 2 % SHAMPOO    Apply 1 application topically. Wash head, face, ears, three times a week, Mon, Thur, Sat   LEVOFLOXACIN (LEVAQUIN) 750 MG TABLET    Take 1 tablet (750 mg total) by mouth daily. X 7 days   LORAZEPAM (ATIVAN) 0.5 MG TABLET    Take 0.5 mg by mouth 2 (two) times daily.    NITROGLYCERIN (NITROSTAT) 0.4 MG SL TABLET    Place 0.4 mg under the tongue every 5 (five) minutes as needed for chest pain.   OMEPRAZOLE (PRILOSEC) 20 MG CAPSULE    Take 20 mg by mouth daily.   QUETIAPINE (SEROQUEL) 25 MG TABLET    Take 25 mg by mouth at bedtime.    SPIRONOLACTONE (ALDACTONE) 25 MG TABLET    Take 1 tablet (25 mg total) by mouth daily.   WARFARIN (COUMADIN) 4 MG TABLET    Take 4 mg  by mouth daily.   Modified Medications   No medications on file  Discontinued Medications   No medications on file    Physical Exam: Filed Vitals:   09/19/14 1129  BP: 106/66  Pulse: 72  Temp: 97.4 F (36.3 C)  Resp: 16  Weight: 233 lb 8 oz (105.915 kg)  SpO2: 97%   Physical Exam  Constitutional: No distress.  HENT:  Head: Normocephalic.  Would not allow me to exam his mouth  Eyes: Pupils are equal, round, and reactive to light.  Neck: No JVD present. No tracheal deviation present. No thyromegaly present.  Cardiovascular:  irreg irreg; 3+ pitting edema  Pulmonary/Chest: Effort normal. No respiratory distress.  Decreased throughout   Abdominal: Soft. Bowel sounds are normal. He exhibits no distension. There is no tenderness.  Lymphadenopathy:    He has no cervical adenopathy.  Neurological:  Can not stay awake to f/c or  answer q's  Skin: Skin is warm and dry. He is not diaphoretic.  No skin tenting today     Labs/Studies reviewed:  CXR 09/01/14 reviewed IMPRESSION: Shallow inspiration with atelectasis in the lung bases. Chronic bronchitic changes. Mild cardiac enlargement. No evidence of active Consolidation.  2D echo 06/2015 Study Conclusions  - Left ventricle: The cavity size was normal. There was mild focal basal hypertrophy of the septum. Systolic function was normal. The estimated ejection fraction was in the range of 50% to 55%. Although no diagnostic regional wall motion abnormality was identified, this possibility cannot be completely excluded on the basis of this study. - Aortic valve: Cusp separation was reduced. - Mitral valve: Calcified annulus. Mildly thickened leaflets . - Left atrium: The atrium was mildly dilated.  Basic Metabolic Panel:  Recent Labs  09/01/14 2352 09/09/14 09/12/14 09/19/14  NA 151* 148* 145 150*  K 4.0 4.1 4.0 3.8  CL 111  --   --   --   CO2 29  --   --   --   GLUCOSE 118*  --   --   --   BUN 48* 37* 38* 36*  CREATININE 1.56* 1.3 1.2 1.2  CALCIUM 8.7  --   --   --     CBC:  Recent Labs  02/22/14 06/27/14 09/01/14 2352  WBC 7.2 6.9 11.2*  NEUTROABS  --   --  6.0  HGB 13.5 13.8 14.5  HCT 40* 40* 47.1  MCV  --   --  98.3  PLT 240 187 208   Lab Results  Component Value Date   INR 3.8* 09/04/2014   INR 3.80* 09/01/2014   INR 2.2* 07/19/2014   PROTIME 37.2* 09/04/2014   PROTIME 24.6* 07/19/2014   PROTIME 25.9* 05/17/2014   Wt Readings from Last 3 Encounters:  09/19/14 233 lb 8 oz (105.915 kg)  09/13/14 236 lb (107.049 kg)  09/05/14 232 lb (105.235 kg)    Assessment/Plan  1. Dehydration Worsened over the weekend. Hold lasix on 3/15 and 3/16 and then recheck BMP on 3/17. Consider lower dose of lasix, see below  2. Edema His weight has remained stable over the past week. Continue to monitor weight, see above  3. AD with  behavioral disturbance Declining over time. Change ativan to prn due to excess sedation. Will taper depakote if poor po intake continues with excess sedation  4. Poor p.o. Intake Combination of AD with functional decline as well as excess sedation from medications leading to hypernatremia.  Encourage fluids.   Cindi Carbon, ANP [facility] [phone]

## 2014-09-20 DIAGNOSIS — R1312 Dysphagia, oropharyngeal phase: Secondary | ICD-10-CM | POA: Diagnosis not present

## 2014-09-20 DIAGNOSIS — E870 Hyperosmolality and hypernatremia: Secondary | ICD-10-CM | POA: Diagnosis not present

## 2014-09-20 DIAGNOSIS — R2681 Unsteadiness on feet: Secondary | ICD-10-CM | POA: Diagnosis not present

## 2014-09-20 DIAGNOSIS — G309 Alzheimer's disease, unspecified: Secondary | ICD-10-CM | POA: Diagnosis not present

## 2014-09-20 DIAGNOSIS — R269 Unspecified abnormalities of gait and mobility: Secondary | ICD-10-CM | POA: Diagnosis not present

## 2014-09-20 DIAGNOSIS — F0391 Unspecified dementia with behavioral disturbance: Secondary | ICD-10-CM | POA: Diagnosis not present

## 2014-09-20 DIAGNOSIS — R2689 Other abnormalities of gait and mobility: Secondary | ICD-10-CM | POA: Diagnosis not present

## 2014-09-21 DIAGNOSIS — R269 Unspecified abnormalities of gait and mobility: Secondary | ICD-10-CM | POA: Diagnosis not present

## 2014-09-21 DIAGNOSIS — R2689 Other abnormalities of gait and mobility: Secondary | ICD-10-CM | POA: Diagnosis not present

## 2014-09-21 DIAGNOSIS — R1312 Dysphagia, oropharyngeal phase: Secondary | ICD-10-CM | POA: Diagnosis not present

## 2014-09-21 DIAGNOSIS — R2681 Unsteadiness on feet: Secondary | ICD-10-CM | POA: Diagnosis not present

## 2014-09-21 DIAGNOSIS — G309 Alzheimer's disease, unspecified: Secondary | ICD-10-CM | POA: Diagnosis not present

## 2014-09-21 DIAGNOSIS — F0391 Unspecified dementia with behavioral disturbance: Secondary | ICD-10-CM | POA: Diagnosis not present

## 2014-09-22 DIAGNOSIS — R269 Unspecified abnormalities of gait and mobility: Secondary | ICD-10-CM | POA: Diagnosis not present

## 2014-09-22 DIAGNOSIS — G309 Alzheimer's disease, unspecified: Secondary | ICD-10-CM | POA: Diagnosis not present

## 2014-09-22 DIAGNOSIS — R1312 Dysphagia, oropharyngeal phase: Secondary | ICD-10-CM | POA: Diagnosis not present

## 2014-09-22 DIAGNOSIS — E870 Hyperosmolality and hypernatremia: Secondary | ICD-10-CM | POA: Diagnosis not present

## 2014-09-22 DIAGNOSIS — R2681 Unsteadiness on feet: Secondary | ICD-10-CM | POA: Diagnosis not present

## 2014-09-22 DIAGNOSIS — R2689 Other abnormalities of gait and mobility: Secondary | ICD-10-CM | POA: Diagnosis not present

## 2014-09-22 DIAGNOSIS — F0391 Unspecified dementia with behavioral disturbance: Secondary | ICD-10-CM | POA: Diagnosis not present

## 2014-09-23 DIAGNOSIS — E860 Dehydration: Secondary | ICD-10-CM | POA: Diagnosis not present

## 2014-09-23 DIAGNOSIS — N183 Chronic kidney disease, stage 3 (moderate): Secondary | ICD-10-CM | POA: Diagnosis not present

## 2014-09-26 DIAGNOSIS — E860 Dehydration: Secondary | ICD-10-CM | POA: Diagnosis not present

## 2014-09-26 DIAGNOSIS — G309 Alzheimer's disease, unspecified: Secondary | ICD-10-CM | POA: Diagnosis not present

## 2014-09-26 DIAGNOSIS — R269 Unspecified abnormalities of gait and mobility: Secondary | ICD-10-CM | POA: Diagnosis not present

## 2014-09-26 DIAGNOSIS — R2681 Unsteadiness on feet: Secondary | ICD-10-CM | POA: Diagnosis not present

## 2014-09-26 DIAGNOSIS — R1312 Dysphagia, oropharyngeal phase: Secondary | ICD-10-CM | POA: Diagnosis not present

## 2014-09-26 DIAGNOSIS — F0391 Unspecified dementia with behavioral disturbance: Secondary | ICD-10-CM | POA: Diagnosis not present

## 2014-09-26 DIAGNOSIS — R2689 Other abnormalities of gait and mobility: Secondary | ICD-10-CM | POA: Diagnosis not present

## 2014-09-27 DIAGNOSIS — R269 Unspecified abnormalities of gait and mobility: Secondary | ICD-10-CM | POA: Diagnosis not present

## 2014-09-27 DIAGNOSIS — R2681 Unsteadiness on feet: Secondary | ICD-10-CM | POA: Diagnosis not present

## 2014-09-27 DIAGNOSIS — R2689 Other abnormalities of gait and mobility: Secondary | ICD-10-CM | POA: Diagnosis not present

## 2014-09-27 DIAGNOSIS — F0391 Unspecified dementia with behavioral disturbance: Secondary | ICD-10-CM | POA: Diagnosis not present

## 2014-09-27 DIAGNOSIS — R1312 Dysphagia, oropharyngeal phase: Secondary | ICD-10-CM | POA: Diagnosis not present

## 2014-09-27 DIAGNOSIS — G309 Alzheimer's disease, unspecified: Secondary | ICD-10-CM | POA: Diagnosis not present

## 2014-09-29 DIAGNOSIS — E860 Dehydration: Secondary | ICD-10-CM | POA: Diagnosis not present

## 2014-09-29 DIAGNOSIS — Z7901 Long term (current) use of anticoagulants: Secondary | ICD-10-CM | POA: Diagnosis not present

## 2014-09-29 LAB — BASIC METABOLIC PANEL
BUN: 32 mg/dL — AB (ref 4–21)
Creatinine: 1 mg/dL (ref 0.6–1.3)
GLUCOSE: 93 mg/dL
Potassium: 4.4 mmol/L (ref 3.4–5.3)
Sodium: 146 mmol/L (ref 137–147)

## 2014-10-03 DIAGNOSIS — Z7901 Long term (current) use of anticoagulants: Secondary | ICD-10-CM | POA: Diagnosis not present

## 2014-10-05 DIAGNOSIS — Z7901 Long term (current) use of anticoagulants: Secondary | ICD-10-CM | POA: Diagnosis not present

## 2014-10-11 ENCOUNTER — Non-Acute Institutional Stay (SKILLED_NURSING_FACILITY): Payer: Medicare Other | Admitting: Adult Health

## 2014-10-11 DIAGNOSIS — I872 Venous insufficiency (chronic) (peripheral): Secondary | ICD-10-CM

## 2014-10-11 DIAGNOSIS — I482 Chronic atrial fibrillation, unspecified: Secondary | ICD-10-CM

## 2014-10-11 DIAGNOSIS — F03918 Unspecified dementia, unspecified severity, with other behavioral disturbance: Secondary | ICD-10-CM

## 2014-10-11 DIAGNOSIS — N183 Chronic kidney disease, stage 3 unspecified: Secondary | ICD-10-CM

## 2014-10-11 DIAGNOSIS — F0391 Unspecified dementia with behavioral disturbance: Secondary | ICD-10-CM | POA: Diagnosis not present

## 2014-10-11 DIAGNOSIS — Z7901 Long term (current) use of anticoagulants: Secondary | ICD-10-CM

## 2014-10-11 DIAGNOSIS — R1314 Dysphagia, pharyngoesophageal phase: Secondary | ICD-10-CM | POA: Diagnosis not present

## 2014-10-13 DIAGNOSIS — G309 Alzheimer's disease, unspecified: Secondary | ICD-10-CM | POA: Diagnosis not present

## 2014-10-13 DIAGNOSIS — R278 Other lack of coordination: Secondary | ICD-10-CM | POA: Diagnosis not present

## 2014-10-13 DIAGNOSIS — F0281 Dementia in other diseases classified elsewhere with behavioral disturbance: Secondary | ICD-10-CM | POA: Diagnosis not present

## 2014-10-13 DIAGNOSIS — F0391 Unspecified dementia with behavioral disturbance: Secondary | ICD-10-CM | POA: Diagnosis not present

## 2014-10-13 DIAGNOSIS — R2689 Other abnormalities of gait and mobility: Secondary | ICD-10-CM | POA: Diagnosis not present

## 2014-10-13 DIAGNOSIS — N183 Chronic kidney disease, stage 3 (moderate): Secondary | ICD-10-CM | POA: Diagnosis not present

## 2014-10-13 NOTE — Progress Notes (Signed)
Patient ID: Bryan [name], male   DOB: 1923/04/14, 81 y.o.   MRN: [id number]  Location:  [facility]   Code Status:  DNR, no hospitalizations  Chief Complaint  Patient presents with  . Medical Management of Chronic Issues    HPI:  81 y.o. male  residing at [facility], memory care section. I am here to follow up regarding his chronic medical issues. He has a hx of AD with behaviors, chronic venous insufficiency, dysphagia, afib on coumadin, CKD, and OA.  Overall he has experienced a functional decline in the past few months, requiring thickened liquids, more assistance with ADLs and decreased mobility. He is no longer on Aricept or Namenda due to this issue. His weight has increased by 4 lbs to 237 lbs. He has had issues recently maintaining fluid balance. He requires Lasix due to venous insuff and diastolic HF, but recently has become dehydrated with an elevated sodium, which required a reduction in his Lasix. This led his recent weight gain. He also had a period of wheezing yesterday, requiring a duoneb.    Review of Systems:  Review of Systems  Unable to perform ROS: dementia      Medications: Patient's Medications  New Prescriptions   No medications on file  Previous Medications   ACETAMINOPHEN (TYLENOL) 325 MG TABLET    Take 2 tablets (650 mg total) by mouth 3 (three) times daily.   ATORVASTATIN (LIPITOR) 10 MG TABLET    Take 10 mg by mouth daily.   DIVALPROEX (DEPAKOTE SPRINKLE) 125 MG CAPSULE    Take 250 mg by mouth 2 (two) times daily.   FUROSEMIDE (LASIX) 40 MG TABLET    Take 80 mg by mouth daily. Take one tablet daily for edema   IPRATROPIUM (ATROVENT) 0.06 % NASAL SPRAY    Place 2 sprays into both nostrils 4 (four) times daily.   KETOCONAZOLE (NIZORAL) 2 % SHAMPOO    Apply 1 application topically. Wash head, face, ears, three times a week, Mon, Thur, Sat   LEVOFLOXACIN (LEVAQUIN) 750 MG TABLET    Take 1 tablet (750 mg  total) by mouth daily. X 7 days   LORAZEPAM (ATIVAN) 0.5 MG TABLET    Take 0.25 mg by mouth at bedtime as needed.    NITROGLYCERIN (NITROSTAT) 0.4 MG SL TABLET    Place 0.4 mg under the tongue every 5 (five) minutes as needed for chest pain.   SPIRONOLACTONE (ALDACTONE) 25 MG TABLET    Take 1 tablet (25 mg total) by mouth daily.   WARFARIN (COUMADIN) 4 MG TABLET    Take 4 mg by mouth daily.   Modified Medications   No medications on file  Discontinued Medications   OMEPRAZOLE (PRILOSEC) 20 MG CAPSULE    Take 20 mg by mouth daily.    Physical Exam: Filed Vitals:   10/11/14 1141  BP: 124/76  Pulse: 81  Temp: 97.5 F (36.4 C)  Resp: 16  Weight: 237 lb (107.502 kg)  SpO2: 97%   Physical Exam  Constitutional: No distress.  HENT:  Head: Normocephalic.  Would not allow me to exam his mouth  Eyes: Pupils are equal, round, and reactive to light.  Neck: No JVD present. No tracheal deviation present. No thyromegaly present.  Cardiovascular:  irreg irreg; 3+ pitting edema  Pulmonary/Chest: Effort normal. No respiratory distress.  Decreased throughout   Abdominal: Soft. Bowel sounds are normal. He exhibits no distension. There is no tenderness.  Lymphadenopathy:  He has no cervical adenopathy.  Neurological: He is alert.  Non verbal  Skin: Skin is warm and dry. He is not diaphoretic.     Labs/Studies reviewed:  CXR 09/01/14 reviewed IMPRESSION: Shallow inspiration with atelectasis in the lung bases. Chronic bronchitic changes. Mild cardiac enlargement. No evidence of active Consolidation.  2D echo 06/2015 Study Conclusions  - Left ventricle: The cavity size was normal. There was mild focal basal hypertrophy of the septum. Systolic function was normal. The estimated ejection fraction was in the range of 50% to 55%. Although no diagnostic regional wall motion abnormality was identified, this possibility cannot be completely excluded on the basis of this study. -  Aortic valve: Cusp separation was reduced. - Mitral valve: Calcified annulus. Mildly thickened leaflets . - Left atrium: The atrium was mildly dilated.  Basic Metabolic Panel:  Recent Labs  09/01/14 2352 09/09/14 09/12/14 09/19/14  NA 151* 148* 145 150*  K 4.0 4.1 4.0 3.8  CL 111  --   --   --   CO2 29  --   --   --   GLUCOSE 118*  --   --   --   BUN 48* 37* 38* 36*  CREATININE 1.56* 1.3 1.2 1.2  CALCIUM 8.7  --   --   --     CBC:  Recent Labs  02/22/14 06/27/14 09/01/14 2352  WBC 7.2 6.9 11.2*  NEUTROABS  --   --  6.0  HGB 13.5 13.8 14.5  HCT 40* 40* 47.1  MCV  --   --  98.3  PLT 240 187 208   Lab Results  Component Value Date   INR 3.8* 09/04/2014   INR 3.80* 09/01/2014   INR 2.2* 07/19/2014   PROTIME 37.2* 09/04/2014   PROTIME 24.6* 07/19/2014   PROTIME 25.9* 05/17/2014   Wt Readings from Last 3 Encounters:  10/11/14 237 lb (107.502 kg)  09/19/14 233 lb 8 oz (105.915 kg)  09/13/14 236 lb (107.049 kg)    Assessment/Plan  1. Dementia with behavioral disturbance Progressive. Off aricept and namenda. No behavioral issues. Continue Depakote in the am to prevent agitation with am care.   2. Chronic atrial fibrillation Rate controlled, on coumadin, see below  3. Chronic venous insufficiency Worsening edema. Check BMP, if sodium has improved will increase lasix to 40 mg daily. Continue to keep his legs elevated and monitor weights  4. Dysphagia, pharyngoesophageal phase Continue NTL, tolerating well  5. CKD (chronic kidney disease) stage 3, GFR 30-59 ml/min Improved  6. Long term current use of anticoagulant therapy Continue current coumadin dose, INR pending.     Cindi Carbon, ANP [facility] [phone]

## 2014-10-18 DIAGNOSIS — M25562 Pain in left knee: Secondary | ICD-10-CM | POA: Diagnosis not present

## 2014-10-18 DIAGNOSIS — M6281 Muscle weakness (generalized): Secondary | ICD-10-CM | POA: Diagnosis not present

## 2014-10-18 DIAGNOSIS — M25561 Pain in right knee: Secondary | ICD-10-CM | POA: Diagnosis not present

## 2014-10-19 DIAGNOSIS — M25561 Pain in right knee: Secondary | ICD-10-CM | POA: Diagnosis not present

## 2014-10-19 DIAGNOSIS — M25562 Pain in left knee: Secondary | ICD-10-CM | POA: Diagnosis not present

## 2014-10-19 DIAGNOSIS — M6281 Muscle weakness (generalized): Secondary | ICD-10-CM | POA: Diagnosis not present

## 2014-10-20 DIAGNOSIS — E860 Dehydration: Secondary | ICD-10-CM | POA: Diagnosis not present

## 2014-10-20 DIAGNOSIS — Z7901 Long term (current) use of anticoagulants: Secondary | ICD-10-CM | POA: Diagnosis not present

## 2014-10-21 DIAGNOSIS — M25561 Pain in right knee: Secondary | ICD-10-CM | POA: Diagnosis not present

## 2014-10-21 DIAGNOSIS — M6281 Muscle weakness (generalized): Secondary | ICD-10-CM | POA: Diagnosis not present

## 2014-10-21 DIAGNOSIS — M25562 Pain in left knee: Secondary | ICD-10-CM | POA: Diagnosis not present

## 2014-10-24 DIAGNOSIS — M25561 Pain in right knee: Secondary | ICD-10-CM | POA: Diagnosis not present

## 2014-10-24 DIAGNOSIS — M25562 Pain in left knee: Secondary | ICD-10-CM | POA: Diagnosis not present

## 2014-10-24 DIAGNOSIS — M6281 Muscle weakness (generalized): Secondary | ICD-10-CM | POA: Diagnosis not present

## 2014-10-25 DIAGNOSIS — M25562 Pain in left knee: Secondary | ICD-10-CM | POA: Diagnosis not present

## 2014-10-25 DIAGNOSIS — M25561 Pain in right knee: Secondary | ICD-10-CM | POA: Diagnosis not present

## 2014-10-25 DIAGNOSIS — M6281 Muscle weakness (generalized): Secondary | ICD-10-CM | POA: Diagnosis not present

## 2014-10-27 DIAGNOSIS — M6281 Muscle weakness (generalized): Secondary | ICD-10-CM | POA: Diagnosis not present

## 2014-10-27 DIAGNOSIS — M25561 Pain in right knee: Secondary | ICD-10-CM | POA: Diagnosis not present

## 2014-10-27 DIAGNOSIS — Z7901 Long term (current) use of anticoagulants: Secondary | ICD-10-CM | POA: Diagnosis not present

## 2014-10-27 DIAGNOSIS — M25562 Pain in left knee: Secondary | ICD-10-CM | POA: Diagnosis not present

## 2014-10-30 DIAGNOSIS — M25562 Pain in left knee: Secondary | ICD-10-CM | POA: Diagnosis not present

## 2014-10-30 DIAGNOSIS — M6281 Muscle weakness (generalized): Secondary | ICD-10-CM | POA: Diagnosis not present

## 2014-10-30 DIAGNOSIS — M25561 Pain in right knee: Secondary | ICD-10-CM | POA: Diagnosis not present

## 2014-11-02 DIAGNOSIS — M6281 Muscle weakness (generalized): Secondary | ICD-10-CM | POA: Diagnosis not present

## 2014-11-02 DIAGNOSIS — M25562 Pain in left knee: Secondary | ICD-10-CM | POA: Diagnosis not present

## 2014-11-02 DIAGNOSIS — M25561 Pain in right knee: Secondary | ICD-10-CM | POA: Diagnosis not present

## 2014-11-03 DIAGNOSIS — Z7901 Long term (current) use of anticoagulants: Secondary | ICD-10-CM | POA: Diagnosis not present

## 2014-11-07 DIAGNOSIS — M25561 Pain in right knee: Secondary | ICD-10-CM | POA: Diagnosis not present

## 2014-11-07 DIAGNOSIS — M25562 Pain in left knee: Secondary | ICD-10-CM | POA: Diagnosis not present

## 2014-11-07 DIAGNOSIS — M6281 Muscle weakness (generalized): Secondary | ICD-10-CM | POA: Diagnosis not present

## 2014-11-09 DIAGNOSIS — M25562 Pain in left knee: Secondary | ICD-10-CM | POA: Diagnosis not present

## 2014-11-09 DIAGNOSIS — M6281 Muscle weakness (generalized): Secondary | ICD-10-CM | POA: Diagnosis not present

## 2014-11-09 DIAGNOSIS — M25561 Pain in right knee: Secondary | ICD-10-CM | POA: Diagnosis not present

## 2014-11-10 ENCOUNTER — Non-Acute Institutional Stay (SKILLED_NURSING_FACILITY): Payer: Medicare Other | Admitting: Adult Health

## 2014-11-10 DIAGNOSIS — F0391 Unspecified dementia with behavioral disturbance: Secondary | ICD-10-CM | POA: Diagnosis not present

## 2014-11-10 DIAGNOSIS — I482 Chronic atrial fibrillation, unspecified: Secondary | ICD-10-CM

## 2014-11-10 DIAGNOSIS — I872 Venous insufficiency (chronic) (peripheral): Secondary | ICD-10-CM | POA: Diagnosis not present

## 2014-11-10 DIAGNOSIS — M25561 Pain in right knee: Secondary | ICD-10-CM | POA: Diagnosis not present

## 2014-11-10 DIAGNOSIS — Z7901 Long term (current) use of anticoagulants: Secondary | ICD-10-CM

## 2014-11-10 DIAGNOSIS — M25562 Pain in left knee: Secondary | ICD-10-CM | POA: Diagnosis not present

## 2014-11-10 DIAGNOSIS — N183 Chronic kidney disease, stage 3 unspecified: Secondary | ICD-10-CM

## 2014-11-10 DIAGNOSIS — F03918 Unspecified dementia, unspecified severity, with other behavioral disturbance: Secondary | ICD-10-CM

## 2014-11-10 DIAGNOSIS — R1314 Dysphagia, pharyngoesophageal phase: Secondary | ICD-10-CM | POA: Diagnosis not present

## 2014-11-10 DIAGNOSIS — M6281 Muscle weakness (generalized): Secondary | ICD-10-CM | POA: Diagnosis not present

## 2014-11-11 DIAGNOSIS — M6281 Muscle weakness (generalized): Secondary | ICD-10-CM | POA: Diagnosis not present

## 2014-11-11 DIAGNOSIS — M25562 Pain in left knee: Secondary | ICD-10-CM | POA: Diagnosis not present

## 2014-11-11 DIAGNOSIS — M25561 Pain in right knee: Secondary | ICD-10-CM | POA: Diagnosis not present

## 2014-11-12 NOTE — Progress Notes (Signed)
Patient ID: Sevren Kett, male   DOB: 06-15-1923, 81 y.o.   MRN: [id number]  Location:  [facility]   Code Status:  DNR, no hospitalizations  Chief Complaint  Patient presents with  . Medical Management of Chronic Issues    HPI:  81 y.o. male  residing at [facility], memory care section. I am here to follow up regarding his chronic medical issues. He has a hx of AD with behaviors, chronic venous insufficiency, dysphagia, afib on coumadin, CKD, and OA.  He has showed progression in his dementia over the past six months but he appears to have hit a plateau.  He is immobile at this point and minimally verbal. He continues on a mech soft diet with NTL and has not had any further issues with pneumonia.  His overall behavioral issues have improved. He is only using depakote in the morning for morning care with no seroquel or ativan.  His weight has remained stable at 236 lbs with stable but significant edema in his lower ext.  He uses elevation for his legs and ted hose. He no longer has any wheezing or SOB.   Review of Systems:  Review of Systems  Unable to perform ROS: dementia      Medications: Patient's Medications  New Prescriptions   No medications on file  Previous Medications   ACETAMINOPHEN (TYLENOL) 325 MG TABLET    Take 2 tablets (650 mg total) by mouth 3 (three) times daily.   ATORVASTATIN (LIPITOR) 10 MG TABLET    Take 10 mg by mouth daily.   DIVALPROEX (DEPAKOTE SPRINKLE) 125 MG CAPSULE    Take 250 mg by mouth daily.    FUROSEMIDE (LASIX) 40 MG TABLET    Take 40 mg by mouth daily. Take one tablet daily for edema   IPRATROPIUM (ATROVENT) 0.06 % NASAL SPRAY    Place 2 sprays into both nostrils 4 (four) times daily.   KETOCONAZOLE (NIZORAL) 2 % SHAMPOO    Apply 1 application topically. Wash head, face, ears, three times a week, Mon, Thur, Sat   LORAZEPAM (ATIVAN) 0.5 MG TABLET    Take 0.25 mg by mouth at bedtime as needed.      NITROGLYCERIN (NITROSTAT) 0.4 MG SL TABLET    Place 0.4 mg under the tongue every 5 (five) minutes as needed for chest pain.   WARFARIN (COUMADIN) 4 MG TABLET    Take 4 mg by mouth daily.   Modified Medications   No medications on file  Discontinued Medications   LEVOFLOXACIN (LEVAQUIN) 750 MG TABLET    Take 1 tablet (750 mg total) by mouth daily. X 7 days   SPIRONOLACTONE (ALDACTONE) 25 MG TABLET    Take 1 tablet (25 mg total) by mouth daily.    Physical Exam: Filed Vitals:   11/12/14 0847  BP: 111/61  Pulse: 68  Temp: 97.6 F (36.4 C)  Resp: 18  Weight: 236 lb 3.2 oz (107.14 kg)  SpO2: 99%   Physical Exam  Constitutional: No distress.  HENT:  Head: Normocephalic.  Eyes: Pupils are equal, round, and reactive to light.  Neck: No JVD present. No tracheal deviation present. No thyromegaly present.  Cardiovascular:  irreg irreg; 3+ pitting edema  Pulmonary/Chest: Effort normal. No respiratory distress.  Decreased throughout   Abdominal: Soft. Bowel sounds are normal. He exhibits no distension. There is no tenderness.  Lymphadenopathy:    He has no cervical adenopathy.  Neurological: He is alert.  Not  able to f/c.  Mumbles and makes eye contact but can not answer q's  Skin: Skin is warm and dry. He is not diaphoretic.     Labs/Studies reviewed:  CXR 09/01/14 reviewed IMPRESSION: Shallow inspiration with atelectasis in the lung bases. Chronic bronchitic changes. Mild cardiac enlargement. No evidence of active Consolidation.  2D echo 06/2015 Study Conclusions  - Left ventricle: The cavity size was normal. There was mild focal basal hypertrophy of the septum. Systolic function was normal. The estimated ejection fraction was in the range of 50% to 55%. Although no diagnostic regional wall motion abnormality was identified, this possibility cannot be completely excluded on the basis of this study. - Aortic valve: Cusp separation was reduced. - Mitral valve:  Calcified annulus. Mildly thickened leaflets . - Left atrium: The atrium was mildly dilated.  Basic Metabolic Panel:  Recent Labs  09/01/14 2352  09/12/14 09/19/14 09/29/14  NA 151*  < > 145 150* 146  K 4.0  < > 4.0 3.8 4.4  CL 111  --   --   --   --   CO2 29  --   --   --   --   GLUCOSE 118*  --   --   --   --   BUN 48*  < > 38* 36* 32*  CREATININE 1.56*  < > 1.2 1.2 1.0  CALCIUM 8.7  --   --   --   --   < > = values in this interval not displayed.  CBC:  Recent Labs  02/22/14 06/27/14 09/01/14 2352  WBC 7.2 6.9 11.2*  NEUTROABS  --   --  6.0  HGB 13.5 13.8 14.5  HCT 40* 40* 47.1  MCV  --   --  98.3  PLT 240 187 208   Lab Results  Component Value Date   INR 3.8* 09/04/2014   INR 3.80* 09/01/2014   INR 2.2* 07/19/2014   PROTIME 37.2* 09/04/2014   PROTIME 24.6* 07/19/2014   PROTIME 25.9* 05/17/2014   Wt Readings from Last 3 Encounters:  11/12/14 236 lb 3.2 oz (107.14 kg)  10/11/14 237 lb (107.502 kg)  09/19/14 233 lb 8 oz (105.915 kg)    Assessment/Plan   1. Dementia with behavioral disturbance Progressive. Off namenda and aricept. Continue depakote to help with morning care. Continue supportive care in the memory unit.   2. Dysphagia, pharyngoesophageal phase Continue mech soft diet with NTL. Tolerating well.   3. Chronic atrial fibrillation Rate controlled without meds. Continue coumadin for CVA risk reduction.   4. Chronic venous insufficiency Stable significant edema. Continue Lasix at 40mg  daily, elevation and TED hose  5. Long term current use of anticoagulant therapy Continue coumadin and INR monitoring.   6. CKD (chronic kidney disease) stage 3, GFR 30-59 ml/min Stable. Check BMP next draw.     Cindi Carbon, ANP [facility] [phone]

## 2014-11-14 DIAGNOSIS — M25561 Pain in right knee: Secondary | ICD-10-CM | POA: Diagnosis not present

## 2014-11-14 DIAGNOSIS — M25562 Pain in left knee: Secondary | ICD-10-CM | POA: Diagnosis not present

## 2014-11-14 DIAGNOSIS — M6281 Muscle weakness (generalized): Secondary | ICD-10-CM | POA: Diagnosis not present

## 2014-11-17 DIAGNOSIS — Z7901 Long term (current) use of anticoagulants: Secondary | ICD-10-CM | POA: Diagnosis not present

## 2014-11-17 DIAGNOSIS — N183 Chronic kidney disease, stage 3 (moderate): Secondary | ICD-10-CM | POA: Diagnosis not present

## 2014-11-25 DIAGNOSIS — L570 Actinic keratosis: Secondary | ICD-10-CM | POA: Diagnosis not present

## 2014-11-25 DIAGNOSIS — L218 Other seborrheic dermatitis: Secondary | ICD-10-CM | POA: Diagnosis not present

## 2014-11-25 DIAGNOSIS — L578 Other skin changes due to chronic exposure to nonionizing radiation: Secondary | ICD-10-CM | POA: Diagnosis not present

## 2014-11-30 DIAGNOSIS — F0151 Vascular dementia with behavioral disturbance: Secondary | ICD-10-CM | POA: Diagnosis not present

## 2014-12-01 DIAGNOSIS — Z7901 Long term (current) use of anticoagulants: Secondary | ICD-10-CM | POA: Diagnosis not present

## 2014-12-15 DIAGNOSIS — Z7901 Long term (current) use of anticoagulants: Secondary | ICD-10-CM | POA: Diagnosis not present

## 2015-01-02 ENCOUNTER — Other Ambulatory Visit: Payer: Self-pay

## 2015-01-05 DIAGNOSIS — Z7901 Long term (current) use of anticoagulants: Secondary | ICD-10-CM | POA: Diagnosis not present

## 2015-01-07 DIAGNOSIS — Z7901 Long term (current) use of anticoagulants: Secondary | ICD-10-CM | POA: Diagnosis not present

## 2015-01-07 LAB — POCT INR: INR: 2.4 — AB (ref 0.9–1.1)

## 2015-01-07 LAB — CBC AND DIFFERENTIAL
HCT: 39 % — AB (ref 41–53)
Hemoglobin: 12.9 g/dL — AB (ref 13.5–17.5)
Platelets: 209 10*3/uL (ref 150–399)
WBC: 8 10^3/mL

## 2015-01-07 LAB — PROTIME-INR: PROTIME: 26.3 s — AB (ref 10.0–13.8)

## 2015-01-12 ENCOUNTER — Non-Acute Institutional Stay (SKILLED_NURSING_FACILITY): Payer: Medicare Other | Admitting: Adult Health

## 2015-01-12 ENCOUNTER — Encounter: Payer: Self-pay | Admitting: Adult Health

## 2015-01-12 DIAGNOSIS — F0391 Unspecified dementia with behavioral disturbance: Secondary | ICD-10-CM | POA: Diagnosis not present

## 2015-01-12 DIAGNOSIS — F03918 Unspecified dementia, unspecified severity, with other behavioral disturbance: Secondary | ICD-10-CM

## 2015-01-12 DIAGNOSIS — I482 Chronic atrial fibrillation, unspecified: Secondary | ICD-10-CM

## 2015-01-12 DIAGNOSIS — R1314 Dysphagia, pharyngoesophageal phase: Secondary | ICD-10-CM | POA: Diagnosis not present

## 2015-01-12 DIAGNOSIS — T148 Other injury of unspecified body region: Secondary | ICD-10-CM | POA: Diagnosis not present

## 2015-01-12 DIAGNOSIS — T148XXA Other injury of unspecified body region, initial encounter: Secondary | ICD-10-CM

## 2015-01-12 DIAGNOSIS — I872 Venous insufficiency (chronic) (peripheral): Secondary | ICD-10-CM

## 2015-01-12 NOTE — Progress Notes (Signed)
Patient ID: Mare Neisen, male   DOB: [birth year], 81 y.o.   MRN: [id number]  Location:  [place] [facility]   Code Status:  DNR, no hospitalizations  Chief Complaint  Patient presents with  . Medical Management of Chronic Issues    HPI:  81 y.o. male  residing at [facility], memory care section. I am here to follow up regarding his chronic medical issues. He has a hx of AD with behaviors, chronic venous insufficiency, dysphagia, afib on coumadin, CKD, HLD, and OA.  He is non ambulatory due to AD and is dependent on the staff for all ADLs. 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 The staff reported a bruised and swollen area to his left calf area on 01/07/15.  A CBC and PT/INR were obtained due to the fact that he is on coumadin. His labs returned normal. He is not able to contribute to the hx.  He uses duoneb prn for wheezing when he lies in bed and has used it 5 x in 2 weeks.  His weight has decreased over time to 232 lbs but he remains with significant edema to both legs which is chronic.    Review of Systems:  Review of Systems  Unable to perform ROS: dementia      Medications: Patient's Medications  New Prescriptions   No medications on file  Previous Medications   ACETAMINOPHEN (TYLENOL) 325 MG TABLET    Take 2 tablets (650 mg total) by mouth 3 (three) times daily.   ATORVASTATIN (LIPITOR) 10 MG TABLET    Take 10 mg by mouth daily.   DIVALPROEX (DEPAKOTE SPRINKLE) 125 MG CAPSULE    Take 250 mg by mouth daily.    FUROSEMIDE (LASIX) 40 MG TABLET    Take 40 mg by mouth daily. Take one tablet daily for edema   IPRATROPIUM (ATROVENT) 0.06 % NASAL SPRAY    Place 2 sprays into both nostrils 4 (four) times daily.   KETOCONAZOLE (NIZORAL) 2 % SHAMPOO    Apply 1 application topically. Wash head, face, ears, three times a week, Mon, Thur, Sat   LORATADINE (CLARITIN) 10 MG TABLET    Take 10 mg by mouth daily.   LORAZEPAM (ATIVAN) 0.5 MG TABLET    Take  0.25 mg by mouth at bedtime as needed.    NITROGLYCERIN (NITROSTAT) 0.4 MG SL TABLET    Place 0.4 mg under the tongue every 5 (five) minutes as needed for chest pain.   POLYETHYLENE GLYCOL (MIRALAX / GLYCOLAX) PACKET    Take 17 g by mouth daily.   WARFARIN (COUMADIN) 4 MG TABLET    Take 4 mg by mouth daily.   Modified Medications   No medications on file  Discontinued Medications   No medications on file    Physical Exam: Filed Vitals:   01/12/15 1508  BP: 127/68  Pulse: 76  Temp: 96.8 F (36 C)  Resp: 18  Weight: 232 lb 6.4 oz (105.416 kg)  SpO2: 93%    Physical Exam  Physical Exam  Constitutional: No distress.  Cardiovascular: Normal rate.   No murmur heard. Irregular. 3+ edema BLE L>R, new edema noted to posterior left knee  Pulmonary/Chest: Effort normal. He has no rales.  Exp wheeze noted bilat  Abdominal: Soft. Bowel sounds are normal. He exhibits no distension. There is no tenderness.  Musculoskeletal:  Decreased ROM to both knees, no obvious pain and tenderness noted  Neurological:  Alert, non verbal, not able  to f/c  Skin: He is not diaphoretic.  Bruising noted to left calf area  Psychiatric: Affect normal.     Labs/Studies reviewed:  CXR 09/01/14 reviewed IMPRESSION: Shallow inspiration with atelectasis in the lung bases. Chronic bronchitic changes. Mild cardiac enlargement. No evidence of active Consolidation.  2D echo 06/2015 Study Conclusions  - Left ventricle: The cavity size was normal. There was mild focal basal hypertrophy of the septum. Systolic function was normal. The estimated ejection fraction was in the range of 50% to 55%. Although no diagnostic regional wall motion abnormality was identified, this possibility cannot be completely excluded on the basis of this study. - Aortic valve: Cusp separation was reduced. - Mitral valve: Calcified annulus. Mildly thickened leaflets . - Left atrium: The atrium was mildly  dilated.  Basic Metabolic Panel:  Recent Labs  09/01/14 2352  09/12/14 09/19/14 09/29/14  NA 151*  < > 145 150* 146  K 4.0  < > 4.0 3.8 4.4  CL 111  --   --   --   --   CO2 29  --   --   --   --   GLUCOSE 118*  --   --   --   --   BUN 48*  < > 38* 36* 32*  CREATININE 1.56*  < > 1.2 1.2 1.0  CALCIUM 8.7  --   --   --   --   < > = values in this interval not displayed.  CBC:  Recent Labs  06/27/14 09/01/14 2352 01/07/15  WBC 6.9 11.2* 8.0  NEUTROABS  --  6.0  --   HGB 13.8 14.5 12.9*  HCT 40* 47.1 39*  MCV  --  98.3  --   PLT 187 208 209   Lab Results  Component Value Date   INR 2.4* 01/07/2015   INR 3.8* 09/04/2014   INR 3.80* 09/01/2014   PROTIME 26.3* 01/07/2015   PROTIME 37.2* 09/04/2014   PROTIME 24.6* 07/19/2014   Wt Readings from Last 3 Encounters:  01/12/15 232 lb 6.4 oz (105.416 kg)  11/12/14 236 lb 3.2 oz (107.14 kg)  10/11/14 237 lb (107.502 kg)    Assessment/Plan  1. Hematoma Resolving. I have asked to have therapy work with the staff on proper positioning and transfer technique. He bruises very easily due to coumadin use and we will need to ensure that he is positioned properly to prevent injury. We will continue to monitor his INR to ensure therapeutic range.  2. Chronic atrial fibrillation Rate controlled without meds. Continue coumadin for CVA risk reduction.   3. Chronic venous insufficiency His edema is unchanged but due to his wheezing on exam and when lying in bed per staff I will increased his lasix to 40 mg in the am and 20 mg in the pm, add Kdur 20 meq daily, and recheck a BMP on Tuesday.   4. Dementia with behavioral disturbance Stable from last visit but overall decline in the past year.   5. Dysphagia, pharyngoesophageal phase Stable on mech soft diet with NTL. No episodes of pneumonia    Cindi Carbon, ANP [facility] [phone]

## 2015-01-17 DIAGNOSIS — R609 Edema, unspecified: Secondary | ICD-10-CM | POA: Diagnosis not present

## 2015-01-17 LAB — BASIC METABOLIC PANEL
BUN: 26 mg/dL — AB (ref 4–21)
Creatinine: 1 mg/dL (ref 0.6–1.3)
Glucose: 89 mg/dL
POTASSIUM: 4.1 mmol/L (ref 3.4–5.3)
Sodium: 143 mmol/L (ref 137–147)

## 2015-01-24 ENCOUNTER — Other Ambulatory Visit: Payer: Self-pay

## 2015-01-31 DIAGNOSIS — Z66 Do not resuscitate: Secondary | ICD-10-CM

## 2015-02-02 DIAGNOSIS — Z7901 Long term (current) use of anticoagulants: Secondary | ICD-10-CM | POA: Diagnosis not present

## 2015-02-02 LAB — POCT INR: INR: 2.8 — AB (ref <=1.1)

## 2015-02-13 ENCOUNTER — Non-Acute Institutional Stay (SKILLED_NURSING_FACILITY): Payer: Medicare Other | Admitting: Adult Health

## 2015-02-13 DIAGNOSIS — I872 Venous insufficiency (chronic) (peripheral): Secondary | ICD-10-CM

## 2015-02-13 DIAGNOSIS — M24561 Contracture, right knee: Secondary | ICD-10-CM

## 2015-02-13 DIAGNOSIS — M24562 Contracture, left knee: Secondary | ICD-10-CM | POA: Diagnosis not present

## 2015-02-13 DIAGNOSIS — R062 Wheezing: Secondary | ICD-10-CM

## 2015-02-13 NOTE — Progress Notes (Signed)
Patient ID: Bryan [name], male   DOB: Aug 07, 1922, 81 y.o.   MRN: [id number]  Location:  [facility]   Code Status:  DNR, no hospitalizations  Chief Complaint  Patient presents with  . Acute Visit    leg contractures, edema    HPI:  81 y.o. male  residing at [facility], memory care section. He has a hx of AD with behaviors, chronic venous insufficiency, diastolic CHF, dysphagia, afib on coumadin, CKD, HLD, and OA.  He is non ambulatory due to AD and is dependent on the staff for all ADLs. 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  He uses duoneb prn for wheezing when he lies in bed and has used it 4x in 2 weeks.  His weight has fluctuated between 237-240 lbs.  His lasix was increased last month due to wheezing and edema with no change in either. It seems to help if he sits upright rather than recumbent.   His son is asking if he can be placed on a exercise program to help with contractures.     Review of Systems:  Review of Systems  Unable to perform ROS: dementia      Medications: Patient's Medications  New Prescriptions   No medications on file  Previous Medications   ACETAMINOPHEN (TYLENOL) 325 MG TABLET    Take 2 tablets (650 mg total) by mouth 3 (three) times daily.   ATORVASTATIN (LIPITOR) 10 MG TABLET    Take 10 mg by mouth daily.   DIVALPROEX (DEPAKOTE SPRINKLE) 125 MG CAPSULE    Take 250 mg by mouth daily.    FUROSEMIDE (LASIX) 40 MG TABLET    Take 40 mg by mouth 2 (two) times daily. 40 mg in the morning and 20 mg in the evening   IPRATROPIUM (ATROVENT) 0.06 % NASAL SPRAY    Place 2 sprays into both nostrils 4 (four) times daily.   KETOCONAZOLE (NIZORAL) 2 % SHAMPOO    Apply 1 application topically. Wash head, face, ears, three times a week, Mon, Thur, Sat   LORATADINE (CLARITIN) 10 MG TABLET    Take 10 mg by mouth daily.   LORAZEPAM (ATIVAN) 0.5 MG TABLET    Take 0.25 mg by mouth at bedtime as needed.    NITROGLYCERIN  (NITROSTAT) 0.4 MG SL TABLET    Place 0.4 mg under the tongue every 5 (five) minutes as needed for chest pain.   POLYETHYLENE GLYCOL (MIRALAX / GLYCOLAX) PACKET    Take 17 g by mouth daily.   POTASSIUM CHLORIDE SA (K-DUR,KLOR-CON) 20 MEQ TABLET    Take 20 mEq by mouth daily.   WARFARIN (COUMADIN) 4 MG TABLET    Take 4 mg by mouth daily.   Modified Medications   No medications on file  Discontinued Medications   No medications on file    Physical Exam: Filed Vitals:   02/13/15 1333  BP: 164/68  Pulse: 77  Temp: 96.3 F (35.7 C)  Resp: 18  Weight: 239 lb (108.41 kg)  SpO2: 92%    Physical Exam  Physical Exam  Constitutional: No distress.  Cardiovascular: Normal rate.   No murmur heard. Irregular. 3+ edema BLE L>R  Pulmonary/Chest: Effort normal and breath sounds normal. No respiratory distress. He has no wheezes. He has no rales.  Abdominal: Soft. Bowel sounds are normal. He exhibits no distension. There is no tenderness.  Musculoskeletal:  Decreased ROM to both knees, no obvious pain and tenderness noted  Neurological:  Alert, non verbal, not able to f/c  Skin: He is not diaphoretic.  Psychiatric: Affect normal.     Labs/Studies reviewed:  CXR 09/01/14 reviewed IMPRESSION: Shallow inspiration with atelectasis in the lung bases. Chronic bronchitic changes. Mild cardiac enlargement. No evidence of active Consolidation.  2D echo 06/2015 Study Conclusions  - Left ventricle: The cavity size was normal. There was mild focal basal hypertrophy of the septum. Systolic function was normal. The estimated ejection fraction was in the range of 50% to 55%. Although no diagnostic regional wall motion abnormality was identified, this possibility cannot be completely excluded on the basis of this study. - Aortic valve: Cusp separation was reduced. - Mitral valve: Calcified annulus. Mildly thickened leaflets . - Left atrium: The atrium was mildly dilated.  Basic  Metabolic Panel:  Recent Labs  09/01/14 2352  09/19/14 09/29/14 01/17/15  NA 151*  < > 150* 146 143  K 4.0  < > 3.8 4.4 4.1  CL 111  --   --   --   --   CO2 29  --   --   --   --   GLUCOSE 118*  --   --   --   --   BUN 48*  < > 36* 32* 26*  CREATININE 1.56*  < > 1.2 1.0 1.0  CALCIUM 8.7  --   --   --   --   < > = values in this interval not displayed.  CBC:  Recent Labs  06/27/14 09/01/14 2352 01/07/15  WBC 6.9 11.2* 8.0  NEUTROABS  --  6.0  --   HGB 13.8 14.5 12.9*  HCT 40* 47.1 39*  MCV  --  98.3  --   PLT 187 208 209   Lab Results  Component Value Date   INR 2.8* 02/02/2015   INR 2.4* 01/07/2015   INR 3.8* 09/04/2014   PROTIME 26.3* 01/07/2015   PROTIME 37.2* 09/04/2014   PROTIME 24.6* 07/19/2014   Wt Readings from Last 3 Encounters:  02/13/15 239 lb (108.41 kg)  01/12/15 232 lb 6.4 oz (105.416 kg)  11/12/14 236 lb 3.2 oz (107.14 kg)    Assessment/Plan  1. Contractures -due to disuse -consult with PT to obtain an exercise program that can be implemented by his caregiver, unfortunately he will not be able to participate in physical therapy due to his dementia  2. Chronic venous insufficiency -continue current dose of lasix and compression hose  3. Wheezing -continues to wheeze occasionally but responds to duoneb -may be due to diastolic dysfxn, he is a former smoker but no known hx of COPD and not a candidate for pft testing -Maintain upright as much as possible and HOB >30 degrees while in bed -may consider adding back aldactone if this continues or weight trends upward However, in the past he was not able to tolerate it due to dehydration but now his  BUN/Cr are improved and his intake is as well  I discussed the above plan of care with his son, Dr. Cheryln Manly, and he is in agreement.   Cindi Carbon, ANP [facility] [phone]

## 2015-02-27 DIAGNOSIS — M24552 Contracture, left hip: Secondary | ICD-10-CM | POA: Diagnosis not present

## 2015-02-27 DIAGNOSIS — M24562 Contracture, left knee: Secondary | ICD-10-CM | POA: Diagnosis not present

## 2015-02-27 DIAGNOSIS — M24511 Contracture, right shoulder: Secondary | ICD-10-CM | POA: Diagnosis not present

## 2015-02-27 DIAGNOSIS — M24551 Contracture, right hip: Secondary | ICD-10-CM | POA: Diagnosis not present

## 2015-02-27 DIAGNOSIS — F0391 Unspecified dementia with behavioral disturbance: Secondary | ICD-10-CM | POA: Diagnosis not present

## 2015-02-27 DIAGNOSIS — M24512 Contracture, left shoulder: Secondary | ICD-10-CM | POA: Diagnosis not present

## 2015-02-27 DIAGNOSIS — F0281 Dementia in other diseases classified elsewhere with behavioral disturbance: Secondary | ICD-10-CM | POA: Diagnosis not present

## 2015-02-27 DIAGNOSIS — M24561 Contracture, right knee: Secondary | ICD-10-CM | POA: Diagnosis not present

## 2015-02-28 DIAGNOSIS — M24512 Contracture, left shoulder: Secondary | ICD-10-CM | POA: Diagnosis not present

## 2015-02-28 DIAGNOSIS — M24551 Contracture, right hip: Secondary | ICD-10-CM | POA: Diagnosis not present

## 2015-02-28 DIAGNOSIS — M24552 Contracture, left hip: Secondary | ICD-10-CM | POA: Diagnosis not present

## 2015-02-28 DIAGNOSIS — M24562 Contracture, left knee: Secondary | ICD-10-CM | POA: Diagnosis not present

## 2015-02-28 DIAGNOSIS — M24511 Contracture, right shoulder: Secondary | ICD-10-CM | POA: Diagnosis not present

## 2015-02-28 DIAGNOSIS — M24561 Contracture, right knee: Secondary | ICD-10-CM | POA: Diagnosis not present

## 2015-03-01 DIAGNOSIS — M24512 Contracture, left shoulder: Secondary | ICD-10-CM | POA: Diagnosis not present

## 2015-03-01 DIAGNOSIS — M24562 Contracture, left knee: Secondary | ICD-10-CM | POA: Diagnosis not present

## 2015-03-01 DIAGNOSIS — M24552 Contracture, left hip: Secondary | ICD-10-CM | POA: Diagnosis not present

## 2015-03-01 DIAGNOSIS — M24561 Contracture, right knee: Secondary | ICD-10-CM | POA: Diagnosis not present

## 2015-03-01 DIAGNOSIS — M24511 Contracture, right shoulder: Secondary | ICD-10-CM | POA: Diagnosis not present

## 2015-03-01 DIAGNOSIS — M24551 Contracture, right hip: Secondary | ICD-10-CM | POA: Diagnosis not present

## 2015-03-02 DIAGNOSIS — I4891 Unspecified atrial fibrillation: Secondary | ICD-10-CM | POA: Diagnosis not present

## 2015-03-02 LAB — PROTIME-INR: Protime: 27 seconds — AB (ref 10.0–13.8)

## 2015-03-02 LAB — POCT INR: INR: 2.5 — AB (ref <=1.1)

## 2015-03-06 DIAGNOSIS — M24512 Contracture, left shoulder: Secondary | ICD-10-CM | POA: Diagnosis not present

## 2015-03-06 DIAGNOSIS — M24511 Contracture, right shoulder: Secondary | ICD-10-CM | POA: Diagnosis not present

## 2015-03-06 DIAGNOSIS — M24551 Contracture, right hip: Secondary | ICD-10-CM | POA: Diagnosis not present

## 2015-03-06 DIAGNOSIS — M24562 Contracture, left knee: Secondary | ICD-10-CM | POA: Diagnosis not present

## 2015-03-06 DIAGNOSIS — M24552 Contracture, left hip: Secondary | ICD-10-CM | POA: Diagnosis not present

## 2015-03-06 DIAGNOSIS — M24561 Contracture, right knee: Secondary | ICD-10-CM | POA: Diagnosis not present

## 2015-03-09 ENCOUNTER — Non-Acute Institutional Stay (SKILLED_NURSING_FACILITY): Payer: Medicare Other | Admitting: Adult Health

## 2015-03-09 ENCOUNTER — Encounter: Payer: Self-pay | Admitting: Adult Health

## 2015-03-09 DIAGNOSIS — I10 Essential (primary) hypertension: Secondary | ICD-10-CM | POA: Diagnosis not present

## 2015-03-09 DIAGNOSIS — F0281 Dementia in other diseases classified elsewhere with behavioral disturbance: Secondary | ICD-10-CM | POA: Diagnosis not present

## 2015-03-09 DIAGNOSIS — M24552 Contracture, left hip: Secondary | ICD-10-CM | POA: Diagnosis not present

## 2015-03-09 DIAGNOSIS — M24512 Contracture, left shoulder: Secondary | ICD-10-CM | POA: Diagnosis not present

## 2015-03-09 DIAGNOSIS — D638 Anemia in other chronic diseases classified elsewhere: Secondary | ICD-10-CM | POA: Diagnosis not present

## 2015-03-09 DIAGNOSIS — R609 Edema, unspecified: Secondary | ICD-10-CM | POA: Diagnosis not present

## 2015-03-09 DIAGNOSIS — L219 Seborrheic dermatitis, unspecified: Secondary | ICD-10-CM | POA: Diagnosis not present

## 2015-03-09 DIAGNOSIS — M24561 Contracture, right knee: Secondary | ICD-10-CM | POA: Diagnosis not present

## 2015-03-09 DIAGNOSIS — M24551 Contracture, right hip: Secondary | ICD-10-CM | POA: Diagnosis not present

## 2015-03-09 DIAGNOSIS — F0391 Unspecified dementia with behavioral disturbance: Secondary | ICD-10-CM | POA: Diagnosis not present

## 2015-03-09 DIAGNOSIS — M24511 Contracture, right shoulder: Secondary | ICD-10-CM | POA: Diagnosis not present

## 2015-03-09 DIAGNOSIS — M24562 Contracture, left knee: Secondary | ICD-10-CM | POA: Diagnosis not present

## 2015-03-09 DIAGNOSIS — D649 Anemia, unspecified: Secondary | ICD-10-CM | POA: Diagnosis not present

## 2015-03-09 LAB — CBC AND DIFFERENTIAL
HCT: 41 % (ref 41–53)
Hemoglobin: 13.6 g/dL (ref 13.5–17.5)
Platelets: 201 10*3/uL (ref 150–399)
WBC: 7.8 10^3/mL

## 2015-03-09 LAB — BASIC METABOLIC PANEL
BUN: 25 mg/dL — AB (ref 4–21)
CREATININE: 1 mg/dL (ref 0.6–1.3)
GLUCOSE: 98 mg/dL
POTASSIUM: 4.4 mmol/L (ref 3.4–5.3)
SODIUM: 139 mmol/L (ref 137–147)

## 2015-03-09 NOTE — Progress Notes (Signed)
Patient ID: Bryan [name], male   DOB: 15-Dec-1922, 81 y.o.   MRN: [id number]  Location:  [facility]   Code Status:  DNR, no hospitalizations  Chief Complaint  Patient presents with  . Acute Visit    f/u anemia, hgb    HPI:  81 y.o. male  residing at [facility], memory care section. He has a hx of AD with behaviors, chronic venous insufficiency, diastolic CHF, dysphagia, afib on coumadin, CKD, HLD, and OA.  He is non ambulatory due to AD and is dependent on the staff for all ADLs. 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  He uses duoneb prn for wheezing when he lies in bed and has used it 3x in 2 weeks.  His weight has fluctuated between 235-244 lbs.   His son is concerned that his Hgb is trending downward but he has not had any dark stools. A CBC was ordered with an improved Hgb of 13.6, he was as high as 16.2  6 years ago.  Hemoccults have been ordered by have not returned. There are no reports of dark stools.  His BMP is unremarkable but he has significant edema to both legs and wears compression hose.  His son, [name], is also concerned that his legs are not being elevated enough in his current chair. I discussed this concern with PT and the staff.   Review of Systems:  Review of Systems  Unable to perform ROS: dementia      Medications: Patient's Medications  New Prescriptions   No medications on file  Previous Medications   ACETAMINOPHEN (TYLENOL) 325 MG TABLET    Take 2 tablets (650 mg total) by mouth 3 (three) times daily.   ATORVASTATIN (LIPITOR) 10 MG TABLET    Take 10 mg by mouth daily.   DIVALPROEX (DEPAKOTE SPRINKLE) 125 MG CAPSULE    Take 250 mg by mouth daily.    FUROSEMIDE (LASIX) 40 MG TABLET    Take 40 mg by mouth 2 (two) times daily. 40 mg in the morning and 20 mg in the evening   IPRATROPIUM (ATROVENT) 0.06 % NASAL SPRAY    Place 2 sprays into both nostrils 4 (four) times daily.   KETOCONAZOLE (NIZORAL) 2 % SHAMPOO     Apply 1 application topically. Wash head, face, ears, three times a week, Mon, Thur, Sat   LORATADINE (CLARITIN) 10 MG TABLET    Take 10 mg by mouth daily.   LORAZEPAM (ATIVAN) 0.5 MG TABLET    Take 0.25 mg by mouth at bedtime as needed.    NITROGLYCERIN (NITROSTAT) 0.4 MG SL TABLET    Place 0.4 mg under the tongue every 5 (five) minutes as needed for chest pain.   POLYETHYLENE GLYCOL (MIRALAX / GLYCOLAX) PACKET    Take 17 g by mouth daily.   POTASSIUM CHLORIDE SA (K-DUR,KLOR-CON) 20 MEQ TABLET    Take 20 mEq by mouth daily.   WARFARIN (COUMADIN) 4 MG TABLET    Take 4 mg by mouth daily.   Modified Medications   No medications on file  Discontinued Medications   No medications on file    Physical Exam: Filed Vitals:   03/09/15 1406  BP: 129/89  Pulse: 68  Temp: 96.2 F (35.7 C)  Resp: 20  Weight: 240 lb 14.4 oz (109.272 kg)  SpO2: 96%    Physical Exam  Physical Exam  Constitutional: No distress.  Cardiovascular: Normal rate.   No murmur heard. Irregular.  3+ edema BLE L>R  Pulmonary/Chest: Effort normal and breath sounds normal. No respiratory distress. He has no wheezes. He has no rales.  Abdominal: Soft. Bowel sounds are normal. He exhibits no distension. There is no tenderness.  Musculoskeletal:  Decreased ROM to both knees, no obvious pain and tenderness noted  Neurological:  Alert, non verbal, not able to f/c  Skin: He is not diaphoretic.  Scaly erythema noted to surrounding mouth and chin  Psychiatric: Affect normal.     Labs/Studies reviewed:  CXR 09/01/14 reviewed IMPRESSION: Shallow inspiration with atelectasis in the lung bases. Chronic bronchitic changes. Mild cardiac enlargement. No evidence of active Consolidation.  2D echo 06/2015 Study Conclusions  - Left ventricle: The cavity size was normal. There was mild focal basal hypertrophy of the septum. Systolic function was normal. The estimated ejection fraction was in the range of 50% to  55%. Although no diagnostic regional wall motion abnormality was identified, this possibility cannot be completely excluded on the basis of this study. - Aortic valve: Cusp separation was reduced. - Mitral valve: Calcified annulus. Mildly thickened leaflets . - Left atrium: The atrium was mildly dilated.  Basic Metabolic Panel:  Recent Labs  09/01/14 2352  09/29/14 01/17/15 03/09/15  NA 151*  < > 146 143 139  K 4.0  < > 4.4 4.1 4.4  CL 111  --   --   --   --   CO2 29  --   --   --   --   GLUCOSE 118*  --   --   --   --   BUN 48*  < > 32* 26* 25*  CREATININE 1.56*  < > 1.0 1.0 1.0  CALCIUM 8.7  --   --   --   --   < > = values in this interval not displayed.  CBC:  Recent Labs  09/01/14 2352 01/07/15 03/09/15  WBC 11.2* 8.0 7.8  NEUTROABS 6.0  --   --   HGB 14.5 12.9* 13.6  HCT 47.1 39* 41  MCV 98.3  --   --   PLT 208 209 201   Lab Results  Component Value Date   INR 2.5* 03/02/2015   INR 2.8* 02/02/2015   INR 2.4* 01/07/2015   PROTIME 27.0* 03/02/2015   PROTIME 26.3* 01/07/2015   PROTIME 37.2* 09/04/2014   Wt Readings from Last 3 Encounters:  03/09/15 240 lb 14.4 oz (109.272 kg)  02/13/15 239 lb (108.41 kg)  01/12/15 232 lb 6.4 oz (105.416 kg)   03/09/15: ferritin 85, TIBC 199, %sat 35, total iron 105  Assessment/Plan  1) Edema -significant to lower ext due to venous insufficiency/diastolic hf, has remained unchanged - due to wheezing, will increase Lasix to 40 mg BID -repeat BMP in 1 week -continue compression -optimize elevation of legs with either the recliner or a new chair (PT to look into this)  2) Seborrhea to face -restart ciclopirox BID to erythema on face  3) Mild normocytic, normochromic anemia -improved, but will check hemoccult since he is coumadin    Cindi Carbon, [place] [phone]

## 2015-03-14 ENCOUNTER — Non-Acute Institutional Stay (SKILLED_NURSING_FACILITY): Payer: Medicare Other | Admitting: Internal Medicine

## 2015-03-14 DIAGNOSIS — I872 Venous insufficiency (chronic) (peripheral): Secondary | ICD-10-CM

## 2015-03-14 DIAGNOSIS — R195 Other fecal abnormalities: Secondary | ICD-10-CM

## 2015-03-14 DIAGNOSIS — M24551 Contracture, right hip: Secondary | ICD-10-CM | POA: Diagnosis not present

## 2015-03-14 DIAGNOSIS — I482 Chronic atrial fibrillation, unspecified: Secondary | ICD-10-CM

## 2015-03-14 DIAGNOSIS — M24562 Contracture, left knee: Secondary | ICD-10-CM | POA: Diagnosis not present

## 2015-03-14 DIAGNOSIS — Z7901 Long term (current) use of anticoagulants: Secondary | ICD-10-CM | POA: Diagnosis not present

## 2015-03-14 DIAGNOSIS — M24512 Contracture, left shoulder: Secondary | ICD-10-CM | POA: Diagnosis not present

## 2015-03-14 DIAGNOSIS — M24552 Contracture, left hip: Secondary | ICD-10-CM | POA: Diagnosis not present

## 2015-03-14 DIAGNOSIS — M24561 Contracture, right knee: Secondary | ICD-10-CM | POA: Diagnosis not present

## 2015-03-14 DIAGNOSIS — M24511 Contracture, right shoulder: Secondary | ICD-10-CM | POA: Diagnosis not present

## 2015-03-14 NOTE — Progress Notes (Signed)
Patient ID: Bryan [name], male   DOB: 25-Nov-1922, 81 y.o.   MRN: [id number]  Location:  [facility] Provider:  Chavon Lucarelli L. Mariea Clonts, D.O., C.M.D.  Code Status: DNR Goals of care: Advanced Directive information Does patient have an advance directive?: Yes, Type of Advance Directive: [place];Living will;Out of facility DNR (pink MOST or yellow form), Pre-existing out of facility DNR order (yellow form or pink MOST form): Pink MOST form placed in chart (order not valid for inpatient use);Yellow form placed in chart (order not valid for inpatient use), Does patient want to make changes to advanced directive?: No - Patient declined  Chief Complaint  Patient presents with  . Acute Visit    2 positive hemoccults 9/1 and 9/3 (only 2 ordered)    HPI:  81 yo white male with late stage AD, but prior stroke, chronic venous insufficiency, CKDIII, BPH, protein-calorie malnutrition and afib was seen for an acute visit due to two positive hemoccult results.  He had just been seen by NP Wert on 9/1 due to his son's concerns about his weight fluctuation between 235 and 244.  He's had chronic severe peripheral edema which staff do not feel has changed significantly.  His h/h has improve.  He's had no abdominal pain, melena or hematochezia.  He has some chronic wheezing.  He continues with his compression hose and it's difficult to elevate his feet too much in his bed or in his chair due to the wheezing.  His lasix was changed to 40mg  po bid on 9/1.  He does remain on coumadin therapy due to afib with risk of recurrent stroke.  When seen, his wife was present and we discussed the results of his hemoccults 2/2 positive for blood.  She agreed that invasively working this up was not in his best interests.  She feels like he's been stable, and doing ok.  His legs are chronically edematous and she does not feel this has changed.  Review of Systems: responses were not reliable or clear,  obtained from his wife and [place] Review of Systems  Unable to perform ROS: dementia  Constitutional: Negative for fever and chills.  HENT: Negative for congestion.   Respiratory: Positive for wheezing. Negative for shortness of breath.        Chronic  Cardiovascular: Positive for leg swelling. Negative for chest pain.  Gastrointestinal: Positive for constipation. Negative for abdominal pain, blood in stool and melena.  Genitourinary: Negative for dysuria.  Skin: Negative for itching and rash.  Neurological: Negative for dizziness.  Psychiatric/Behavioral: Positive for memory loss.    Past Medical History  Diagnosis Date  . Hyperlipidemia   . Alzheimer's disease 2013  . GERD (gastroesophageal reflux disease)   . BPH (benign prostatic hyperplasia)   . Atrial fibrillation     Long term anti coagulation w/ warfarin fro stroke risk reduction  . 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 Lactose intolerance   . Allergic rhinitis   . Long term (current) use of anticoagulants 11/23/2012    Long-term anticoagulation with warfarin for stroke risk reduction related to AF  . Gait disorder   . Chronic venous insufficiency 01/12/2013  . Dementia with behavioral disturbance 12/29/13    Patient Active Problem List   Diagnosis Date Noted  . Dysphagia, pharyngoesophageal phase 09/05/2014  . Protein-calorie malnutrition 08/09/2014  . Hyperlipidemia 06/03/2014  . CKD (chronic kidney disease) stage 3, GFR 30-59 ml/min 06/03/2014  .  Dementia with behavioral disturbance 12/29/2013  . Skin lesion of face 03/30/2013  . Osteoarthritis of spine 03/10/2013  . Chronic venous insufficiency 01/12/2013  . Edema of both legs 01/12/2013  . Long term current use of anticoagulant therapy 11/23/2012  . Alzheimer's disease   . Atrial fibrillation   . BPH (benign prostatic hyperplasia)   . Gait disorder     Allergies  Allergen Reactions  . Penicillins Rash  . Bee Venom   .  Risperidone And Related     Unstable gait    Medications: Patient's Medications  New Prescriptions   No medications on file  Previous Medications   ACETAMINOPHEN (TYLENOL) 325 MG TABLET    Take 2 tablets (650 mg total) by mouth 3 (three) times daily.   ATORVASTATIN (LIPITOR) 10 MG TABLET    Take 10 mg by mouth daily.   DIVALPROEX (DEPAKOTE SPRINKLE) 125 MG CAPSULE    Take 250 mg by mouth daily.    FUROSEMIDE (LASIX) 40 MG TABLET    Take 40 mg by mouth 2 (two) times daily.    IPRATROPIUM (ATROVENT) 0.06 % NASAL SPRAY    Place 2 sprays into both nostrils 4 (four) times daily.   KETOCONAZOLE (NIZORAL) 2 % SHAMPOO    Apply 1 application topically. Wash head, face, ears, three times a week, Mon, Thur, Sat   LORATADINE (CLARITIN) 10 MG TABLET    Take 10 mg by mouth daily.   LORAZEPAM (ATIVAN) 0.5 MG TABLET    Take 0.25 mg by mouth at bedtime as needed.    NITROGLYCERIN (NITROSTAT) 0.4 MG SL TABLET    Place 0.4 mg under the tongue every 5 (five) minutes as needed for chest pain.   POLYETHYLENE GLYCOL (MIRALAX / GLYCOLAX) PACKET    Take 17 g by mouth daily.   POTASSIUM CHLORIDE SA (K-DUR,KLOR-CON) 20 MEQ TABLET    Take 20 mEq by mouth daily.   WARFARIN (COUMADIN) 4 MG TABLET    Take 4 mg by mouth daily.   Modified Medications   No medications on file  Discontinued Medications   No medications on file    Physical Exam: There were no vitals filed for this visit. There is no weight on file to calculate BMI.  Physical Exam  Constitutional: He appears well-developed and well-nourished. No distress.  HENT:  Head: Normocephalic and atraumatic.  Cardiovascular:  irreg irreg; bilateral severe edema, but unchanged from previous visits, wearing compression hose  Pulmonary/Chest: Effort normal. He has wheezes. He has no rales.  Abdominal: Soft. Bowel sounds are normal.  rotund  Neurological: He is alert.  Attempts to converse, but speech unclear and does not make sense  Skin: Skin is warm and  dry.  Psychiatric:  pleasant    Labs reviewed: Basic Metabolic Panel:  Recent Labs  09/01/14 2352  09/29/14 01/17/15 03/09/15  NA 151*  < > 146 143 139  K 4.0  < > 4.4 4.1 4.4  CL 111  --   --   --   --   CO2 29  --   --   --   --   GLUCOSE 118*  --   --   --   --   BUN 48*  < > 32* 26* 25*  CREATININE 1.56*  < > 1.0 1.0 1.0  CALCIUM 8.7  --   --   --   --   < > = values in this interval not displayed.  Liver Function Tests:  Recent Labs  09/01/14 2352  AST 24  ALT 18  ALKPHOS 126*  BILITOT 0.8  PROT 7.2  ALBUMIN 2.7*    CBC:  Recent Labs  09/01/14 2352 01/07/15 03/09/15  WBC 11.2* 8.0 7.8  NEUTROABS 6.0  --   --   HGB 14.5 12.9* 13.6  HCT 47.1 39* 41  MCV 98.3  --   --   PLT 208 209 201    No results found for: TSH No results found for: HGBA1C Lab Results  Component Value Date   CHOL 117 01/25/2014   HDL 41 01/25/2014   LDLCALC 51 01/25/2014   TRIG 126 01/25/2014    Patient Care Team: [name], DO as PCP - General (Geriatric Medicine) [facility] [place], NP as Nurse Practitioner (Nurse Practitioner)  Assessment/Plan 1. Heme positive stool -2/2 FOBT positive -he has not had symptoms of abdominal pain or any visible hematochezia, hematemesis, melena nor has he been losing weight -cbc showed increased h/h so cont to monitor -would favor d/c coumadin to avoid INR checks in this man with advanced dementia and either replace with alternative or nothing at this point, but is high stroke risk  2. Chronic venous insufficiency -stable edema -cont compression hose -would not adjust diuretics unless he was having increased dyspnea and over chf  3. Chronic atrial fibrillation -rate is controlled w/o meds, continues on coumadin per family request due to high CHADS2vasc of 6  4. Long term current use of anticoagulant therapy -continues coumadin, INR goal 2-3  Family/ staff Communication: discussed with his wife who  was present during the visit  Labs/tests ordered:  Would f/u cbc, bmp next routine visit  Winter Trefz L. Danaja Lasota, D.O. [facility] [address], [place] [address] Cell Phone (Mon-Fri 8am-5pm):  [phone] On Call:  [phone] & follow prompts after 5pm & weekends Office Phone:  [phone] Office Fax:  [phone]

## 2015-03-16 DIAGNOSIS — D649 Anemia, unspecified: Secondary | ICD-10-CM | POA: Diagnosis not present

## 2015-03-16 DIAGNOSIS — I10 Essential (primary) hypertension: Secondary | ICD-10-CM | POA: Diagnosis not present

## 2015-03-16 LAB — BASIC METABOLIC PANEL
BUN: 28 mg/dL — AB (ref 4–21)
CREATININE: 1.2 mg/dL (ref 0.6–1.3)
Glucose: 87 mg/dL
Potassium: 5 mmol/L (ref 3.4–5.3)
Sodium: 143 mmol/L (ref 137–147)

## 2015-03-19 ENCOUNTER — Encounter: Payer: Self-pay | Admitting: Internal Medicine

## 2015-03-30 DIAGNOSIS — I482 Chronic atrial fibrillation: Secondary | ICD-10-CM | POA: Diagnosis not present

## 2015-04-12 DIAGNOSIS — Z23 Encounter for immunization: Secondary | ICD-10-CM | POA: Diagnosis not present

## 2015-04-20 ENCOUNTER — Non-Acute Institutional Stay (SKILLED_NURSING_FACILITY): Payer: Medicare Other | Admitting: Adult Health

## 2015-04-20 ENCOUNTER — Encounter: Payer: Self-pay | Admitting: Adult Health

## 2015-04-20 DIAGNOSIS — Z7901 Long term (current) use of anticoagulants: Secondary | ICD-10-CM | POA: Diagnosis not present

## 2015-04-20 DIAGNOSIS — I482 Chronic atrial fibrillation, unspecified: Secondary | ICD-10-CM

## 2015-04-20 DIAGNOSIS — F0391 Unspecified dementia with behavioral disturbance: Secondary | ICD-10-CM | POA: Diagnosis not present

## 2015-04-20 DIAGNOSIS — I872 Venous insufficiency (chronic) (peripheral): Secondary | ICD-10-CM

## 2015-04-20 DIAGNOSIS — N183 Chronic kidney disease, stage 3 unspecified: Secondary | ICD-10-CM

## 2015-04-20 DIAGNOSIS — F03918 Unspecified dementia, unspecified severity, with other behavioral disturbance: Secondary | ICD-10-CM

## 2015-04-20 NOTE — Progress Notes (Signed)
Patient ID: Bryan [name], male   DOB: [birth year], 81 y.o.   MRN: [id number]  Location:  [facility]   Code Status:  DNR, no hospitalizations  Chief Complaint  Patient presents with  . Acute Visit    elevated INR  . Medical Management of Chronic Issues    HPI:  81 y.o. male  residing at [facility], memory care section. He has a hx of AD with behaviors, chronic venous insufficiency, diastolic CHF, dysphagia, afib on coumadin, CKD, HLD, and OA.  He is non ambulatory due to AD and is dependent on the staff for all ADLs. 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  He uses duoneb prn for wheezing when he lies in bed, but has not used in the past week.  His weight is stable at 239 lbs.  Lasix was increased for this reason with no improvement in edema or wheezing.  He was heme pos x 2 last month but no work up was performed due to his age and co morbidities (disccused with his wife). Today his INR was reported at 10 at the lab and later performed on the machine in the facility and was 4.3.  He had no signs of bleeding.    Review of Systems:  Review of Systems  Unable to perform ROS: dementia      Medications: Patient's Medications  New Prescriptions   No medications on file  Previous Medications   ACETAMINOPHEN (TYLENOL) 325 MG TABLET    Take 2 tablets (650 mg total) by mouth 3 (three) times daily.   ATORVASTATIN (LIPITOR) 10 MG TABLET    Take 10 mg by mouth daily.   DIVALPROEX (DEPAKOTE SPRINKLE) 125 MG CAPSULE    Take 250 mg by mouth daily.    FUROSEMIDE (LASIX) 40 MG TABLET    Take 40 mg by mouth 2 (two) times daily.    IPRATROPIUM (ATROVENT) 0.06 % NASAL SPRAY    Place 2 sprays into both nostrils 4 (four) times daily.   KETOCONAZOLE (NIZORAL) 2 % SHAMPOO    Apply 1 application topically. Wash head, face, ears, three times a week, Mon, Thur, Sat   LORATADINE (CLARITIN) 10 MG TABLET    Take 10 mg by mouth daily.   LORAZEPAM (ATIVAN) 0.5 MG  TABLET    Take 0.25 mg by mouth at bedtime as needed.    NITROGLYCERIN (NITROSTAT) 0.4 MG SL TABLET    Place 0.4 mg under the tongue every 5 (five) minutes as needed for chest pain.   POLYETHYLENE GLYCOL (MIRALAX / GLYCOLAX) PACKET    Take 17 g by mouth daily.   POTASSIUM CHLORIDE SA (K-DUR,KLOR-CON) 20 MEQ TABLET    Take 20 mEq by mouth daily.   WARFARIN (COUMADIN) 4 MG TABLET    Take 4 mg by mouth daily.   Modified Medications   No medications on file  Discontinued Medications   No medications on file    Physical Exam: Filed Vitals:   04/20/15 1446  BP: 132/90  Pulse: 84  Temp: 97.5 F (36.4 C)  Resp: 22  Weight: 239 lb 6.4 oz (108.591 kg)    Physical Exam  Physical Exam  Constitutional: No distress.  Cardiovascular: Normal rate.   No murmur heard. Irregular. 3+ edema BLE L>R  Pulmonary/Chest: Effort normal. No respiratory distress. He has wheezes. He has no rales.  Abdominal: Soft. Bowel sounds are normal. He exhibits no distension. There is no tenderness.  Musculoskeletal:  Decreased ROM  to both knees, no obvious pain and tenderness noted  Neurological:  Alert, non verbal, not able to f/c  Skin: He is not diaphoretic.  Scaly erythema noted to surrounding mouth and chin  Psychiatric: Affect normal.     Labs/Studies reviewed:  CXR 09/01/14 reviewed IMPRESSION: Shallow inspiration with atelectasis in the lung bases. Chronic bronchitic changes. Mild cardiac enlargement. No evidence of active Consolidation.  2D echo 06/2015 Study Conclusions  - Left ventricle: The cavity size was normal. There was mild focal basal hypertrophy of the septum. Systolic function was normal. The estimated ejection fraction was in the range of 50% to 55%. Although no diagnostic regional wall motion abnormality was identified, this possibility cannot be completely excluded on the basis of this study. - Aortic valve: Cusp separation was reduced. - Mitral valve: Calcified  annulus. Mildly thickened leaflets . - Left atrium: The atrium was mildly dilated.  Basic Metabolic Panel:  Recent Labs  09/01/14 2352  01/17/15 03/09/15 03/16/15  NA 151*  < > 143 139 143  K 4.0  < > 4.1 4.4 5.0  CL 111  --   --   --   --   CO2 29  --   --   --   --   GLUCOSE 118*  --   --   --   --   BUN 48*  < > 26* 25* 28*  CREATININE 1.56*  < > 1.0 1.0 1.2  CALCIUM 8.7  --   --   --   --   < > = values in this interval not displayed.  CBC:  Recent Labs  09/01/14 2352 01/07/15 03/09/15  WBC 11.2* 8.0 7.8  NEUTROABS 6.0  --   --   HGB 14.5 12.9* 13.6  HCT 47.1 39* 41  MCV 98.3  --   --   PLT 208 209 201   Lab Results  Component Value Date   INR 2.5* 03/02/2015   INR 2.8* 02/02/2015   INR 2.4* 01/07/2015   PROTIME 27.0* 03/02/2015   PROTIME 26.3* 01/07/2015   PROTIME 37.2* 09/04/2014   Wt Readings from Last 3 Encounters:  04/20/15 239 lb 6.4 oz (108.591 kg)  03/14/15 240 lb 4.8 oz (108.999 kg)  03/09/15 240 lb 14.4 oz (109.272 kg)   03/09/15: ferritin 85, TIBC 199, %sat 35, total iron 105  Assessment/Plan  1. Chronic atrial fibrillation (HCC) -rate controlled without meds -currently coumadin on hold  2. Long term current use of anticoagulant therapy -repeat INR ordered due to conflicting lab values -no signs of bleeding -depending upon results tomorrow will discuss with his son about changing to xarelto vs discontinuing therapy  3. CKD (chronic kidney disease) stage 3, GFR 30-59 ml/min -stable, continue to monitor   4. Dementia with behavioral disturbance -advanced with dependency for all ADLs -some agitation with care but overall stable -continue depakote  5. Chronic venous insufficiency -stable edema -continue teds, elevation and diuretic therapy    Cindi Carbon, ANP [facility] [phone]

## 2015-04-20 NOTE — Progress Notes (Signed)
This encounter was created in error - please disregard.

## 2015-04-21 DIAGNOSIS — I5032 Chronic diastolic (congestive) heart failure: Secondary | ICD-10-CM | POA: Diagnosis not present

## 2015-04-21 DIAGNOSIS — Z7901 Long term (current) use of anticoagulants: Secondary | ICD-10-CM | POA: Diagnosis not present

## 2015-04-22 DIAGNOSIS — Z7901 Long term (current) use of anticoagulants: Secondary | ICD-10-CM | POA: Diagnosis not present

## 2015-04-24 DIAGNOSIS — I4891 Unspecified atrial fibrillation: Secondary | ICD-10-CM | POA: Diagnosis not present

## 2015-04-24 DIAGNOSIS — Z7901 Long term (current) use of anticoagulants: Secondary | ICD-10-CM | POA: Diagnosis not present

## 2015-05-02 DIAGNOSIS — I4891 Unspecified atrial fibrillation: Secondary | ICD-10-CM | POA: Diagnosis not present

## 2015-05-08 DIAGNOSIS — F0281 Dementia in other diseases classified elsewhere with behavioral disturbance: Secondary | ICD-10-CM | POA: Diagnosis not present

## 2015-05-08 DIAGNOSIS — N3946 Mixed incontinence: Secondary | ICD-10-CM | POA: Diagnosis not present

## 2015-05-08 DIAGNOSIS — Z7389 Other problems related to life management difficulty: Secondary | ICD-10-CM | POA: Diagnosis not present

## 2015-05-09 DIAGNOSIS — N3946 Mixed incontinence: Secondary | ICD-10-CM | POA: Diagnosis not present

## 2015-05-09 DIAGNOSIS — F0281 Dementia in other diseases classified elsewhere with behavioral disturbance: Secondary | ICD-10-CM | POA: Diagnosis not present

## 2015-05-09 DIAGNOSIS — Z7389 Other problems related to life management difficulty: Secondary | ICD-10-CM | POA: Diagnosis not present

## 2015-05-10 DIAGNOSIS — N3946 Mixed incontinence: Secondary | ICD-10-CM | POA: Diagnosis not present

## 2015-05-10 DIAGNOSIS — Z7389 Other problems related to life management difficulty: Secondary | ICD-10-CM | POA: Diagnosis not present

## 2015-05-10 DIAGNOSIS — F0281 Dementia in other diseases classified elsewhere with behavioral disturbance: Secondary | ICD-10-CM | POA: Diagnosis not present

## 2015-05-11 DIAGNOSIS — N3946 Mixed incontinence: Secondary | ICD-10-CM | POA: Diagnosis not present

## 2015-05-11 DIAGNOSIS — F0281 Dementia in other diseases classified elsewhere with behavioral disturbance: Secondary | ICD-10-CM | POA: Diagnosis not present

## 2015-05-11 DIAGNOSIS — Z7389 Other problems related to life management difficulty: Secondary | ICD-10-CM | POA: Diagnosis not present

## 2015-05-12 DIAGNOSIS — Z7389 Other problems related to life management difficulty: Secondary | ICD-10-CM | POA: Diagnosis not present

## 2015-05-12 DIAGNOSIS — F0281 Dementia in other diseases classified elsewhere with behavioral disturbance: Secondary | ICD-10-CM | POA: Diagnosis not present

## 2015-05-12 DIAGNOSIS — N3946 Mixed incontinence: Secondary | ICD-10-CM | POA: Diagnosis not present

## 2015-05-15 DIAGNOSIS — Z7389 Other problems related to life management difficulty: Secondary | ICD-10-CM | POA: Diagnosis not present

## 2015-05-15 DIAGNOSIS — F0281 Dementia in other diseases classified elsewhere with behavioral disturbance: Secondary | ICD-10-CM | POA: Diagnosis not present

## 2015-05-15 DIAGNOSIS — N3946 Mixed incontinence: Secondary | ICD-10-CM | POA: Diagnosis not present

## 2015-05-16 DIAGNOSIS — F0281 Dementia in other diseases classified elsewhere with behavioral disturbance: Secondary | ICD-10-CM | POA: Diagnosis not present

## 2015-05-16 DIAGNOSIS — I4891 Unspecified atrial fibrillation: Secondary | ICD-10-CM | POA: Diagnosis not present

## 2015-05-16 DIAGNOSIS — N3946 Mixed incontinence: Secondary | ICD-10-CM | POA: Diagnosis not present

## 2015-05-16 DIAGNOSIS — Z7389 Other problems related to life management difficulty: Secondary | ICD-10-CM | POA: Diagnosis not present

## 2015-05-16 LAB — POCT INR: INR: 1.8 — AB (ref <=1.1)

## 2015-05-17 DIAGNOSIS — Z7389 Other problems related to life management difficulty: Secondary | ICD-10-CM | POA: Diagnosis not present

## 2015-05-17 DIAGNOSIS — N3946 Mixed incontinence: Secondary | ICD-10-CM | POA: Diagnosis not present

## 2015-05-17 DIAGNOSIS — F0281 Dementia in other diseases classified elsewhere with behavioral disturbance: Secondary | ICD-10-CM | POA: Diagnosis not present

## 2015-05-18 NOTE — Progress Notes (Signed)
This encounter was created in error - please disregard.

## 2015-05-19 ENCOUNTER — Non-Acute Institutional Stay (SKILLED_NURSING_FACILITY): Payer: Medicare Other | Admitting: Adult Health

## 2015-05-19 ENCOUNTER — Encounter: Payer: Self-pay | Admitting: Adult Health

## 2015-05-19 DIAGNOSIS — Z7901 Long term (current) use of anticoagulants: Secondary | ICD-10-CM | POA: Diagnosis not present

## 2015-05-19 DIAGNOSIS — I872 Venous insufficiency (chronic) (peripheral): Secondary | ICD-10-CM

## 2015-05-19 DIAGNOSIS — N183 Chronic kidney disease, stage 3 unspecified: Secondary | ICD-10-CM

## 2015-05-19 DIAGNOSIS — I482 Chronic atrial fibrillation, unspecified: Secondary | ICD-10-CM

## 2015-05-19 DIAGNOSIS — N3946 Mixed incontinence: Secondary | ICD-10-CM | POA: Diagnosis not present

## 2015-05-19 DIAGNOSIS — M1711 Unilateral primary osteoarthritis, right knee: Secondary | ICD-10-CM

## 2015-05-19 DIAGNOSIS — F03918 Unspecified dementia, unspecified severity, with other behavioral disturbance: Secondary | ICD-10-CM

## 2015-05-19 DIAGNOSIS — Z7389 Other problems related to life management difficulty: Secondary | ICD-10-CM | POA: Diagnosis not present

## 2015-05-19 DIAGNOSIS — F0281 Dementia in other diseases classified elsewhere with behavioral disturbance: Secondary | ICD-10-CM | POA: Diagnosis not present

## 2015-05-19 DIAGNOSIS — F0391 Unspecified dementia with behavioral disturbance: Secondary | ICD-10-CM

## 2015-05-19 NOTE — Progress Notes (Signed)
Patient ID: Bryan [name], male   DOB: 11-15-1922, 81 y.o.   MRN: [id number]  Location:  [facility] [facility]   Code Status:  DNR, no hospitalizations  Chief Complaint  Patient presents with  . Medical Management of Chronic Issues    HPI:  81 y.o. male  residing at [facility], memory care section. He has a hx of AD with behaviors, chronic venous insufficiency, diastolic CHF, dysphagia, afib on coumadin, CKD, HLD, and OA.  He is non ambulatory due to AD and is dependent on the staff for all ADLs. 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  His weight has increased by 2 lbs in the past month to 241 lbs. He has not had any sob or required duonebs in the past two weeks.  He was started on ultram 25 mg BID due to perceived pain with incontinence care on 11/2. He has tolerated this well but the staff is not sure if this is helping. Its difficult to assess any pain as he is resistant to any care provided and not able to communicate what he is feeling. He has a hx of OA but also has contractures to both legs, with some swelling on the right knee.  He has periods of agitation with personal care and has prn ativan ordered. This has not been used due to request made by his family.  He has dysphagia and is currently on mech soft diet with NTL, no bout of choking have been reported.    Review of Systems:  Review of Systems  Unable to perform ROS: dementia      Medications: Patient's Medications  New Prescriptions   No medications on file  Previous Medications   ATORVASTATIN (LIPITOR) 10 MG TABLET    Take 10 mg by mouth daily.   DIVALPROEX (DEPAKOTE SPRINKLE) 125 MG CAPSULE    Take 250 mg by mouth daily.    FUROSEMIDE (LASIX) 40 MG TABLET    Take 40 mg by mouth 2 (two) times daily.    IPRATROPIUM (ATROVENT) 0.06 % NASAL SPRAY    Place 2 sprays into both nostrils 3 (three) times daily.    KETOCONAZOLE (NIZORAL) 2 % SHAMPOO    Apply 1 application topically. Wash  head, face, ears, three times a week, Mon, Thur, Sat   LORATADINE (CLARITIN) 10 MG TABLET    Take 10 mg by mouth daily.   LORAZEPAM (ATIVAN) 0.5 MG TABLET    Take 0.25 mg by mouth daily as needed.    NITROGLYCERIN (NITROSTAT) 0.4 MG SL TABLET    Place 0.4 mg under the tongue every 5 (five) minutes as needed for chest pain.   POLYETHYLENE GLYCOL (MIRALAX / GLYCOLAX) PACKET    Take 17 g by mouth daily.   POTASSIUM CHLORIDE SA (K-DUR,KLOR-CON) 20 MEQ TABLET    Take 20 mEq by mouth daily.   PROTEIN Renee Pain) POWD    Take 1 each by mouth daily.   WARFARIN (COUMADIN) 4 MG TABLET    Take 4 mg by mouth daily.   Modified Medications   No medications on file  Discontinued Medications   ACETAMINOPHEN (TYLENOL) 325 MG TABLET    Take 2 tablets (650 mg total) by mouth 3 (three) times daily.    Physical Exam: Filed Vitals:   05/19/15 1103  BP: 136/66  Pulse: 62  Temp: 96.1 F (35.6 C)  Resp: 16  Weight: 241 lb (109.317 kg)  SpO2: 97%    Physical Exam  Physical Exam  Constitutional: No distress.  HENT:  Head: Normocephalic and atraumatic.  Cardiovascular: Normal rate.   No murmur heard. Irregular. 4+ edema BLE L>R  Pulmonary/Chest: Effort normal. No respiratory distress. He has no wheezes. He has no rales.  Abdominal: Soft. Bowel sounds are normal. He exhibits no distension. There is no tenderness.  Musculoskeletal: He exhibits edema (right knee, no warmth, no redness).  Decreased ROM to both knees, hips, shoulders  Neurological: He is alert.  Alert, non verbal, not able to f/c  Skin: He is not diaphoretic.  Scaly erythema noted to surrounding mouth and chin  Psychiatric: Affect normal.     Labs/Studies reviewed:  CXR 09/01/14 reviewed IMPRESSION: Shallow inspiration with atelectasis in the lung bases. Chronic bronchitic changes. Mild cardiac enlargement. No evidence of active Consolidation.  2D echo 06/2015 Study Conclusions  - Left ventricle: The cavity size was normal. There  was mild focal basal hypertrophy of the septum. Systolic function was normal. The estimated ejection fraction was in the range of 50% to 55%. Although no diagnostic regional wall motion abnormality was identified, this possibility cannot be completely excluded on the basis of this study. - Aortic valve: Cusp separation was reduced. - Mitral valve: Calcified annulus. Mildly thickened leaflets . - Left atrium: The atrium was mildly dilated.  Basic Metabolic Panel:  Recent Labs  09/01/14 2352  01/17/15 03/09/15 03/16/15  NA 151*  < > 143 139 143  K 4.0  < > 4.1 4.4 5.0  CL 111  --   --   --   --   CO2 29  --   --   --   --   GLUCOSE 118*  --   --   --   --   BUN 48*  < > 26* 25* 28*  CREATININE 1.56*  < > 1.0 1.0 1.2  CALCIUM 8.7  --   --   --   --   < > = values in this interval not displayed.  CBC:  Recent Labs  09/01/14 2352 01/07/15 03/09/15  WBC 11.2* 8.0 7.8  NEUTROABS 6.0  --   --   HGB 14.5 12.9* 13.6  HCT 47.1 39* 41  MCV 98.3  --   --   PLT 208 209 201   Lab Results  Component Value Date   INR 1.8* 05/16/2015   INR 2.5* 03/02/2015   INR 2.8* 02/02/2015   PROTIME 27.0* 03/02/2015   PROTIME 26.3* 01/07/2015   PROTIME 37.2* 09/04/2014   Wt Readings from Last 3 Encounters:  05/19/15 241 lb (109.317 kg)  04/20/15 239 lb 6.4 oz (108.591 kg)  03/14/15 240 lb 4.8 oz (108.999 kg)   03/09/15: ferritin 85, TIBC 199, %sat 35, total iron 105  Assessment/Plan  1. Dementia with behavioral disturbance -stable with periods of agitation -continue to work with OT to help find ways to improve this during incontinence care -continue prn ativan  2. Chronic venous insufficiency -compression hose, elevation -lasix 40 mg BID -BMP q 3 months  3. Chronic atrial fibrillation (HCC) -rate controlled without meds -continue coumadin  4. Current use of long term anticoagulation -alternating 3 mg with 4 mg and check INR next week  5. CKD (chronic kidney disease)  stage 3, GFR 30-59 ml/min -stable  6. Primary osteoarthritis of right knee -continue ultram, no nsaids while on coumadin -could consider knee injection but will hold off for now    Cindi Carbon, [place] [phone]

## 2015-05-22 DIAGNOSIS — F0281 Dementia in other diseases classified elsewhere with behavioral disturbance: Secondary | ICD-10-CM | POA: Diagnosis not present

## 2015-05-22 DIAGNOSIS — N3946 Mixed incontinence: Secondary | ICD-10-CM | POA: Diagnosis not present

## 2015-05-22 DIAGNOSIS — Z7389 Other problems related to life management difficulty: Secondary | ICD-10-CM | POA: Diagnosis not present

## 2015-05-23 DIAGNOSIS — F0281 Dementia in other diseases classified elsewhere with behavioral disturbance: Secondary | ICD-10-CM | POA: Diagnosis not present

## 2015-05-23 DIAGNOSIS — Z7389 Other problems related to life management difficulty: Secondary | ICD-10-CM | POA: Diagnosis not present

## 2015-05-23 DIAGNOSIS — N3946 Mixed incontinence: Secondary | ICD-10-CM | POA: Diagnosis not present

## 2015-05-23 DIAGNOSIS — I4891 Unspecified atrial fibrillation: Secondary | ICD-10-CM | POA: Diagnosis not present

## 2015-05-24 DIAGNOSIS — F0281 Dementia in other diseases classified elsewhere with behavioral disturbance: Secondary | ICD-10-CM | POA: Diagnosis not present

## 2015-05-24 DIAGNOSIS — N3946 Mixed incontinence: Secondary | ICD-10-CM | POA: Diagnosis not present

## 2015-05-24 DIAGNOSIS — Z7389 Other problems related to life management difficulty: Secondary | ICD-10-CM | POA: Diagnosis not present

## 2015-05-26 DIAGNOSIS — F0281 Dementia in other diseases classified elsewhere with behavioral disturbance: Secondary | ICD-10-CM | POA: Diagnosis not present

## 2015-05-26 DIAGNOSIS — L578 Other skin changes due to chronic exposure to nonionizing radiation: Secondary | ICD-10-CM | POA: Diagnosis not present

## 2015-05-26 DIAGNOSIS — L814 Other melanin hyperpigmentation: Secondary | ICD-10-CM | POA: Diagnosis not present

## 2015-05-26 DIAGNOSIS — N3946 Mixed incontinence: Secondary | ICD-10-CM | POA: Diagnosis not present

## 2015-05-26 DIAGNOSIS — Z7389 Other problems related to life management difficulty: Secondary | ICD-10-CM | POA: Diagnosis not present

## 2015-05-28 DIAGNOSIS — Z7389 Other problems related to life management difficulty: Secondary | ICD-10-CM | POA: Diagnosis not present

## 2015-05-28 DIAGNOSIS — N3946 Mixed incontinence: Secondary | ICD-10-CM | POA: Diagnosis not present

## 2015-05-28 DIAGNOSIS — F0281 Dementia in other diseases classified elsewhere with behavioral disturbance: Secondary | ICD-10-CM | POA: Diagnosis not present

## 2015-05-29 DIAGNOSIS — N3946 Mixed incontinence: Secondary | ICD-10-CM | POA: Diagnosis not present

## 2015-05-29 DIAGNOSIS — Z7389 Other problems related to life management difficulty: Secondary | ICD-10-CM | POA: Diagnosis not present

## 2015-05-29 DIAGNOSIS — F0281 Dementia in other diseases classified elsewhere with behavioral disturbance: Secondary | ICD-10-CM | POA: Diagnosis not present

## 2015-05-30 DIAGNOSIS — Z7389 Other problems related to life management difficulty: Secondary | ICD-10-CM | POA: Diagnosis not present

## 2015-05-30 DIAGNOSIS — N3946 Mixed incontinence: Secondary | ICD-10-CM | POA: Diagnosis not present

## 2015-05-30 DIAGNOSIS — F0281 Dementia in other diseases classified elsewhere with behavioral disturbance: Secondary | ICD-10-CM | POA: Diagnosis not present

## 2015-05-31 DIAGNOSIS — Z7389 Other problems related to life management difficulty: Secondary | ICD-10-CM | POA: Diagnosis not present

## 2015-05-31 DIAGNOSIS — F0281 Dementia in other diseases classified elsewhere with behavioral disturbance: Secondary | ICD-10-CM | POA: Diagnosis not present

## 2015-05-31 DIAGNOSIS — N3946 Mixed incontinence: Secondary | ICD-10-CM | POA: Diagnosis not present

## 2015-06-05 DIAGNOSIS — Z7389 Other problems related to life management difficulty: Secondary | ICD-10-CM | POA: Diagnosis not present

## 2015-06-05 DIAGNOSIS — N3946 Mixed incontinence: Secondary | ICD-10-CM | POA: Diagnosis not present

## 2015-06-05 DIAGNOSIS — F0281 Dementia in other diseases classified elsewhere with behavioral disturbance: Secondary | ICD-10-CM | POA: Diagnosis not present

## 2015-06-06 DIAGNOSIS — F0281 Dementia in other diseases classified elsewhere with behavioral disturbance: Secondary | ICD-10-CM | POA: Diagnosis not present

## 2015-06-06 DIAGNOSIS — N3946 Mixed incontinence: Secondary | ICD-10-CM | POA: Diagnosis not present

## 2015-06-06 DIAGNOSIS — Z7389 Other problems related to life management difficulty: Secondary | ICD-10-CM | POA: Diagnosis not present

## 2015-06-12 DIAGNOSIS — Z7389 Other problems related to life management difficulty: Secondary | ICD-10-CM | POA: Diagnosis not present

## 2015-06-12 DIAGNOSIS — F0281 Dementia in other diseases classified elsewhere with behavioral disturbance: Secondary | ICD-10-CM | POA: Diagnosis not present

## 2015-06-12 DIAGNOSIS — N3946 Mixed incontinence: Secondary | ICD-10-CM | POA: Diagnosis not present

## 2015-06-13 DIAGNOSIS — N3946 Mixed incontinence: Secondary | ICD-10-CM | POA: Diagnosis not present

## 2015-06-13 DIAGNOSIS — Z7389 Other problems related to life management difficulty: Secondary | ICD-10-CM | POA: Diagnosis not present

## 2015-06-13 DIAGNOSIS — F0281 Dementia in other diseases classified elsewhere with behavioral disturbance: Secondary | ICD-10-CM | POA: Diagnosis not present

## 2015-06-14 DIAGNOSIS — F0281 Dementia in other diseases classified elsewhere with behavioral disturbance: Secondary | ICD-10-CM | POA: Diagnosis not present

## 2015-06-14 DIAGNOSIS — Z7389 Other problems related to life management difficulty: Secondary | ICD-10-CM | POA: Diagnosis not present

## 2015-06-14 DIAGNOSIS — N3946 Mixed incontinence: Secondary | ICD-10-CM | POA: Diagnosis not present

## 2015-06-15 DIAGNOSIS — Z7389 Other problems related to life management difficulty: Secondary | ICD-10-CM | POA: Diagnosis not present

## 2015-06-15 DIAGNOSIS — N3946 Mixed incontinence: Secondary | ICD-10-CM | POA: Diagnosis not present

## 2015-06-15 DIAGNOSIS — F0281 Dementia in other diseases classified elsewhere with behavioral disturbance: Secondary | ICD-10-CM | POA: Diagnosis not present

## 2015-06-16 DIAGNOSIS — Z7389 Other problems related to life management difficulty: Secondary | ICD-10-CM | POA: Diagnosis not present

## 2015-06-16 DIAGNOSIS — N3946 Mixed incontinence: Secondary | ICD-10-CM | POA: Diagnosis not present

## 2015-06-16 DIAGNOSIS — F0281 Dementia in other diseases classified elsewhere with behavioral disturbance: Secondary | ICD-10-CM | POA: Diagnosis not present

## 2015-06-18 DIAGNOSIS — Z7389 Other problems related to life management difficulty: Secondary | ICD-10-CM | POA: Diagnosis not present

## 2015-06-18 DIAGNOSIS — F0281 Dementia in other diseases classified elsewhere with behavioral disturbance: Secondary | ICD-10-CM | POA: Diagnosis not present

## 2015-06-18 DIAGNOSIS — N3946 Mixed incontinence: Secondary | ICD-10-CM | POA: Diagnosis not present

## 2015-06-19 DIAGNOSIS — N3946 Mixed incontinence: Secondary | ICD-10-CM | POA: Diagnosis not present

## 2015-06-19 DIAGNOSIS — Z7389 Other problems related to life management difficulty: Secondary | ICD-10-CM | POA: Diagnosis not present

## 2015-06-19 DIAGNOSIS — F0281 Dementia in other diseases classified elsewhere with behavioral disturbance: Secondary | ICD-10-CM | POA: Diagnosis not present

## 2015-06-20 DIAGNOSIS — R609 Edema, unspecified: Secondary | ICD-10-CM | POA: Diagnosis not present

## 2015-06-20 DIAGNOSIS — I482 Chronic atrial fibrillation: Secondary | ICD-10-CM | POA: Diagnosis not present

## 2015-06-20 DIAGNOSIS — N3946 Mixed incontinence: Secondary | ICD-10-CM | POA: Diagnosis not present

## 2015-06-20 DIAGNOSIS — N183 Chronic kidney disease, stage 3 (moderate): Secondary | ICD-10-CM | POA: Diagnosis not present

## 2015-06-20 DIAGNOSIS — F0281 Dementia in other diseases classified elsewhere with behavioral disturbance: Secondary | ICD-10-CM | POA: Diagnosis not present

## 2015-06-20 DIAGNOSIS — Z7389 Other problems related to life management difficulty: Secondary | ICD-10-CM | POA: Diagnosis not present

## 2015-06-20 LAB — HEPATIC FUNCTION PANEL
ALT: 13 U/L (ref 10–40)
AST: 14 U/L (ref 14–40)
Alkaline Phosphatase: 88 U/L (ref 25–125)
BILIRUBIN, TOTAL: 0.6 mg/dL

## 2015-06-20 LAB — CBC AND DIFFERENTIAL
HEMATOCRIT: 41 % (ref 41–53)
Hemoglobin: 14.4 g/dL (ref 13.5–17.5)
PLATELETS: 252 10*3/uL (ref 150–399)
WBC: 7.7 10^3/mL

## 2015-06-20 LAB — BASIC METABOLIC PANEL
BUN: 30 mg/dL — AB (ref 4–21)
Creatinine: 1.3 mg/dL (ref 0.6–1.3)
GLUCOSE: 100 mg/dL
Potassium: 4.3 mmol/L (ref 3.4–5.3)
Sodium: 147 mmol/L (ref 137–147)

## 2015-06-20 NOTE — Progress Notes (Signed)
This encounter was created in error - please disregard.

## 2015-06-22 ENCOUNTER — Non-Acute Institutional Stay (SKILLED_NURSING_FACILITY): Payer: Medicare Other | Admitting: Adult Health

## 2015-06-22 DIAGNOSIS — N183 Chronic kidney disease, stage 3 unspecified: Secondary | ICD-10-CM

## 2015-06-22 DIAGNOSIS — F0151 Vascular dementia with behavioral disturbance: Secondary | ICD-10-CM | POA: Diagnosis not present

## 2015-06-22 DIAGNOSIS — R29898 Other symptoms and signs involving the musculoskeletal system: Secondary | ICD-10-CM

## 2015-06-22 DIAGNOSIS — E870 Hyperosmolality and hypernatremia: Secondary | ICD-10-CM

## 2015-06-22 DIAGNOSIS — I872 Venous insufficiency (chronic) (peripheral): Secondary | ICD-10-CM

## 2015-06-23 ENCOUNTER — Encounter: Payer: Self-pay | Admitting: Adult Health

## 2015-06-23 DIAGNOSIS — R29898 Other symptoms and signs involving the musculoskeletal system: Secondary | ICD-10-CM | POA: Insufficient documentation

## 2015-06-23 NOTE — Progress Notes (Signed)
Patient ID: Bryan [name], male   DOB: [birth year], 80 y.o.   MRN: [id number]   Location:  [facility] ([place]   Code Status:  DNR, no hospitalizations  Chief Complaint  Patient presents with  . Acute Visit    hypernatremia, rigidity    HPI:  81 y.o. male  residing at [facility], memory care section. He has a hx of AD with behaviors, chronic venous insufficiency, diastolic CHF, dysphagia, afib on coumadin, CKD, HLD, and OA.  He is non ambulatory due to AD and is dependent on the staff for all ADLs. 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  His weight has trended downward to 236 lbs with lasix 40 mg BID. His NA returned at 147, BUN/Cr 30/1.3 (slight increase).  He tends to need higher doses of lasix due to fluid overload but has a propensity for dehydration due to his dementia and thickened liquid diet. OT has mentioned that he may benefit from a muscle relaxer due to his rigidity and resistance during personal care. We have tried ativan 0.25 mg without response.  Review of Systems:  Review of Systems  Unable to perform ROS: dementia      Medications: Patient's Medications  New Prescriptions   No medications on file  Previous Medications   DIVALPROEX (DEPAKOTE SPRINKLE) 125 MG CAPSULE    Take 250 mg by mouth daily.    FUROSEMIDE (LASIX) 40 MG TABLET    Take 40 mg by mouth 2 (two) times daily.    IPRATROPIUM (ATROVENT) 0.06 % NASAL SPRAY    Place 2 sprays into both nostrils 3 (three) times daily.    KETOCONAZOLE (NIZORAL) 2 % SHAMPOO    Apply 1 application topically. Wash head, face, ears, three times a week, Mon, Thur, Sat   LORATADINE (CLARITIN) 10 MG TABLET    Take 10 mg by mouth daily.   LORAZEPAM (ATIVAN) 0.5 MG TABLET    Take 0.25 mg by mouth daily as needed.    NITROGLYCERIN (NITROSTAT) 0.4 MG SL TABLET    Place 0.4 mg under the tongue every 5 (five) minutes as needed for chest pain.   POLYETHYLENE GLYCOL (MIRALAX / GLYCOLAX) PACKET    Take  17 g by mouth daily.   POTASSIUM CHLORIDE SA (K-DUR,KLOR-CON) 20 MEQ TABLET    Take 20 mEq by mouth daily.   PROTEIN Renee Pain) POWD    Take 1 each by mouth daily.   TRAMADOL (ULTRAM) 50 MG TABLET    Take 25 mg by mouth 2 (two) times daily.   WARFARIN (COUMADIN) 4 MG TABLET    Take 4 mg by mouth daily.   Modified Medications   No medications on file  Discontinued Medications   ATORVASTATIN (LIPITOR) 10 MG TABLET    Take 10 mg by mouth daily.    Physical Exam: Filed Vitals:   06/23/15 0853  BP: 110/70  Pulse: 81  Temp: 97.3 F (36.3 C)  Resp: 16  Weight: 236 lb (107.049 kg)    Wt Readings from Last 3 Encounters:  06/23/15 236 lb (107.049 kg)  05/19/15 241 lb (109.317 kg)  04/20/15 239 lb 6.4 oz (108.591 kg)    Physical Exam  Constitutional: No distress.  HENT:  Head: Normocephalic and atraumatic.  Neck: No JVD present.  Cardiovascular: Normal rate.   No murmur heard. Irregular. 4+ edema BLE L>R  Pulmonary/Chest: Effort normal. No respiratory distress. He has wheezes. He has no rales.  Abdominal: Soft. Bowel sounds are  normal. He exhibits no distension. There is no tenderness.  Musculoskeletal: Edema: right knee, no warmth, no redness.  Decreased ROM to both knees, hips, shoulders. Rigidity noted with ROM to all joints, also resisting exam  Neurological: He is alert.  Alert, non verbal, not able to f/c  Skin: He is not diaphoretic.  Scaly erythema noted to surrounding mouth and chin  Psychiatric: Affect normal.     Labs/Studies reviewed:  CXR 09/01/14 reviewed IMPRESSION: Shallow inspiration with atelectasis in the lung bases. Chronic bronchitic changes. Mild cardiac enlargement. No evidence of active Consolidation.  2D echo 06/2015 Study Conclusions  - Left ventricle: The cavity size was normal. There was mild focal basal hypertrophy of the septum. Systolic function was normal. The estimated ejection fraction was in the range of 50% to 55%. Although no  diagnostic regional wall motion abnormality was identified, this possibility cannot be completely excluded on the basis of this study. - Aortic valve: Cusp separation was reduced. - Mitral valve: Calcified annulus. Mildly thickened leaflets . - Left atrium: The atrium was mildly dilated.  Basic Metabolic Panel:  Recent Labs  09/01/14 2352  03/09/15 03/16/15 06/20/15  NA 151*  < > 139 143 147  K 4.0  < > 4.4 5.0 4.3  CL 111  --   --   --   --   CO2 29  --   --   --   --   GLUCOSE 118*  --   --   --   --   BUN 48*  < > 25* 28* 30*  CREATININE 1.56*  < > 1.0 1.2 1.3  CALCIUM 8.7  --   --   --   --   < > = values in this interval not displayed.  CBC:  Recent Labs  09/01/14 2352 01/07/15 03/09/15 06/20/15  WBC 11.2* 8.0 7.8 7.7  NEUTROABS 6.0  --   --   --   HGB 14.5 12.9* 13.6 14.4  HCT 47.1 39* 41 41  MCV 98.3  --   --   --   PLT 208 209 201 252   Lab Results  Component Value Date   INR 1.8* 05/16/2015   INR 2.5* 03/02/2015   INR 2.8* 02/02/2015   PROTIME 27.0* 03/02/2015   PROTIME 26.3* 01/07/2015   PROTIME 37.2* 09/04/2014   Wt Readings from Last 3 Encounters:  06/23/15 236 lb (107.049 kg)  05/19/15 241 lb (109.317 kg)  04/20/15 239 lb 6.4 oz (108.591 kg)   03/09/15: ferritin 85, TIBC 199, %sat 35, total iron 105  Assessment/Plan  1. Rigidity -may try baclofen for rigidity during personal care -Nsg supervisor has called the family and they will get back with [place] if they agree -He appears to have some rigidity to both upper and lower extremities without tremor or postural instability.  This is exacerbated by the fact that he resists care due to his dementia.  2. Hypernatremia -slight increase in NA -decrease lasix to 40 in the am and 20 in the pm -encourage fluids  3. CKD (chronic kidney disease) stage 3, GFR 30-59 ml/min -slight bump in BUN/CR due to dehydration -recheck in 1 week  4. Chronic venous insufficiency -stable, weight down 4 lbs -continue  to monitor weight and apply compression stockings -elevate legs when possible    Cindi Carbon, [place] [phone]

## 2015-06-26 DIAGNOSIS — N3946 Mixed incontinence: Secondary | ICD-10-CM | POA: Diagnosis not present

## 2015-06-26 DIAGNOSIS — F0281 Dementia in other diseases classified elsewhere with behavioral disturbance: Secondary | ICD-10-CM | POA: Diagnosis not present

## 2015-06-26 DIAGNOSIS — Z7389 Other problems related to life management difficulty: Secondary | ICD-10-CM | POA: Diagnosis not present

## 2015-06-27 DIAGNOSIS — F0281 Dementia in other diseases classified elsewhere with behavioral disturbance: Secondary | ICD-10-CM | POA: Diagnosis not present

## 2015-06-27 DIAGNOSIS — Z7389 Other problems related to life management difficulty: Secondary | ICD-10-CM | POA: Diagnosis not present

## 2015-06-27 DIAGNOSIS — N3946 Mixed incontinence: Secondary | ICD-10-CM | POA: Diagnosis not present

## 2015-06-28 DIAGNOSIS — Z7389 Other problems related to life management difficulty: Secondary | ICD-10-CM | POA: Diagnosis not present

## 2015-06-28 DIAGNOSIS — F0281 Dementia in other diseases classified elsewhere with behavioral disturbance: Secondary | ICD-10-CM | POA: Diagnosis not present

## 2015-06-28 DIAGNOSIS — N3946 Mixed incontinence: Secondary | ICD-10-CM | POA: Diagnosis not present

## 2015-06-29 DIAGNOSIS — F0281 Dementia in other diseases classified elsewhere with behavioral disturbance: Secondary | ICD-10-CM | POA: Diagnosis not present

## 2015-06-29 DIAGNOSIS — Z7389 Other problems related to life management difficulty: Secondary | ICD-10-CM | POA: Diagnosis not present

## 2015-06-29 DIAGNOSIS — I82401 Acute embolism and thrombosis of unspecified deep veins of right lower extremity: Secondary | ICD-10-CM | POA: Diagnosis not present

## 2015-06-29 DIAGNOSIS — N3946 Mixed incontinence: Secondary | ICD-10-CM | POA: Diagnosis not present

## 2015-06-29 DIAGNOSIS — R609 Edema, unspecified: Secondary | ICD-10-CM | POA: Diagnosis not present

## 2015-06-30 DIAGNOSIS — Z7389 Other problems related to life management difficulty: Secondary | ICD-10-CM | POA: Diagnosis not present

## 2015-06-30 DIAGNOSIS — F0281 Dementia in other diseases classified elsewhere with behavioral disturbance: Secondary | ICD-10-CM | POA: Diagnosis not present

## 2015-06-30 DIAGNOSIS — N3946 Mixed incontinence: Secondary | ICD-10-CM | POA: Diagnosis not present

## 2015-07-05 DIAGNOSIS — N3946 Mixed incontinence: Secondary | ICD-10-CM | POA: Diagnosis not present

## 2015-07-05 DIAGNOSIS — Z7389 Other problems related to life management difficulty: Secondary | ICD-10-CM | POA: Diagnosis not present

## 2015-07-05 DIAGNOSIS — F0281 Dementia in other diseases classified elsewhere with behavioral disturbance: Secondary | ICD-10-CM | POA: Diagnosis not present

## 2015-07-06 DIAGNOSIS — N3946 Mixed incontinence: Secondary | ICD-10-CM | POA: Diagnosis not present

## 2015-07-06 DIAGNOSIS — F0281 Dementia in other diseases classified elsewhere with behavioral disturbance: Secondary | ICD-10-CM | POA: Diagnosis not present

## 2015-07-06 DIAGNOSIS — Z7389 Other problems related to life management difficulty: Secondary | ICD-10-CM | POA: Diagnosis not present

## 2015-07-07 DIAGNOSIS — N3946 Mixed incontinence: Secondary | ICD-10-CM | POA: Diagnosis not present

## 2015-07-07 DIAGNOSIS — F0281 Dementia in other diseases classified elsewhere with behavioral disturbance: Secondary | ICD-10-CM | POA: Diagnosis not present

## 2015-07-07 DIAGNOSIS — Z7389 Other problems related to life management difficulty: Secondary | ICD-10-CM | POA: Diagnosis not present

## 2015-07-10 DIAGNOSIS — F0281 Dementia in other diseases classified elsewhere with behavioral disturbance: Secondary | ICD-10-CM | POA: Diagnosis not present

## 2015-07-10 DIAGNOSIS — N3946 Mixed incontinence: Secondary | ICD-10-CM | POA: Diagnosis not present

## 2015-07-10 DIAGNOSIS — Z7389 Other problems related to life management difficulty: Secondary | ICD-10-CM | POA: Diagnosis not present

## 2015-07-12 DIAGNOSIS — Z7389 Other problems related to life management difficulty: Secondary | ICD-10-CM | POA: Diagnosis not present

## 2015-07-12 DIAGNOSIS — N3946 Mixed incontinence: Secondary | ICD-10-CM | POA: Diagnosis not present

## 2015-07-12 DIAGNOSIS — F0281 Dementia in other diseases classified elsewhere with behavioral disturbance: Secondary | ICD-10-CM | POA: Diagnosis not present

## 2015-07-13 DIAGNOSIS — Z7389 Other problems related to life management difficulty: Secondary | ICD-10-CM | POA: Diagnosis not present

## 2015-07-13 DIAGNOSIS — N3946 Mixed incontinence: Secondary | ICD-10-CM | POA: Diagnosis not present

## 2015-07-13 DIAGNOSIS — F0281 Dementia in other diseases classified elsewhere with behavioral disturbance: Secondary | ICD-10-CM | POA: Diagnosis not present

## 2015-07-14 DIAGNOSIS — N3946 Mixed incontinence: Secondary | ICD-10-CM | POA: Diagnosis not present

## 2015-07-14 DIAGNOSIS — F0281 Dementia in other diseases classified elsewhere with behavioral disturbance: Secondary | ICD-10-CM | POA: Diagnosis not present

## 2015-07-14 DIAGNOSIS — Z7389 Other problems related to life management difficulty: Secondary | ICD-10-CM | POA: Diagnosis not present

## 2015-07-17 DIAGNOSIS — F0281 Dementia in other diseases classified elsewhere with behavioral disturbance: Secondary | ICD-10-CM | POA: Diagnosis not present

## 2015-07-17 DIAGNOSIS — N3946 Mixed incontinence: Secondary | ICD-10-CM | POA: Diagnosis not present

## 2015-07-17 DIAGNOSIS — Z7389 Other problems related to life management difficulty: Secondary | ICD-10-CM | POA: Diagnosis not present

## 2015-07-18 DIAGNOSIS — F0281 Dementia in other diseases classified elsewhere with behavioral disturbance: Secondary | ICD-10-CM | POA: Diagnosis not present

## 2015-07-18 DIAGNOSIS — N3946 Mixed incontinence: Secondary | ICD-10-CM | POA: Diagnosis not present

## 2015-07-18 DIAGNOSIS — Z7389 Other problems related to life management difficulty: Secondary | ICD-10-CM | POA: Diagnosis not present

## 2015-07-20 DIAGNOSIS — N3946 Mixed incontinence: Secondary | ICD-10-CM | POA: Diagnosis not present

## 2015-07-20 DIAGNOSIS — Z7389 Other problems related to life management difficulty: Secondary | ICD-10-CM | POA: Diagnosis not present

## 2015-07-20 DIAGNOSIS — F0281 Dementia in other diseases classified elsewhere with behavioral disturbance: Secondary | ICD-10-CM | POA: Diagnosis not present

## 2015-07-21 DIAGNOSIS — Z7389 Other problems related to life management difficulty: Secondary | ICD-10-CM | POA: Diagnosis not present

## 2015-07-21 DIAGNOSIS — N3946 Mixed incontinence: Secondary | ICD-10-CM | POA: Diagnosis not present

## 2015-07-21 DIAGNOSIS — F0281 Dementia in other diseases classified elsewhere with behavioral disturbance: Secondary | ICD-10-CM | POA: Diagnosis not present

## 2015-07-23 DIAGNOSIS — F0281 Dementia in other diseases classified elsewhere with behavioral disturbance: Secondary | ICD-10-CM | POA: Diagnosis not present

## 2015-07-23 DIAGNOSIS — N3946 Mixed incontinence: Secondary | ICD-10-CM | POA: Diagnosis not present

## 2015-07-23 DIAGNOSIS — Z7389 Other problems related to life management difficulty: Secondary | ICD-10-CM | POA: Diagnosis not present

## 2015-07-25 DIAGNOSIS — F0281 Dementia in other diseases classified elsewhere with behavioral disturbance: Secondary | ICD-10-CM | POA: Diagnosis not present

## 2015-07-25 DIAGNOSIS — N3946 Mixed incontinence: Secondary | ICD-10-CM | POA: Diagnosis not present

## 2015-07-25 DIAGNOSIS — Z7389 Other problems related to life management difficulty: Secondary | ICD-10-CM | POA: Diagnosis not present

## 2015-07-27 DIAGNOSIS — N3946 Mixed incontinence: Secondary | ICD-10-CM | POA: Diagnosis not present

## 2015-07-27 DIAGNOSIS — F0281 Dementia in other diseases classified elsewhere with behavioral disturbance: Secondary | ICD-10-CM | POA: Diagnosis not present

## 2015-07-27 DIAGNOSIS — Z7389 Other problems related to life management difficulty: Secondary | ICD-10-CM | POA: Diagnosis not present

## 2015-08-03 ENCOUNTER — Non-Acute Institutional Stay (SKILLED_NURSING_FACILITY): Payer: Medicare Other | Admitting: Adult Health

## 2015-08-03 ENCOUNTER — Encounter: Payer: Self-pay | Admitting: Adult Health

## 2015-08-03 DIAGNOSIS — R29898 Other symptoms and signs involving the musculoskeletal system: Secondary | ICD-10-CM | POA: Diagnosis not present

## 2015-08-03 DIAGNOSIS — R600 Localized edema: Secondary | ICD-10-CM

## 2015-08-03 DIAGNOSIS — F0281 Dementia in other diseases classified elsewhere with behavioral disturbance: Secondary | ICD-10-CM

## 2015-08-03 DIAGNOSIS — I482 Chronic atrial fibrillation, unspecified: Secondary | ICD-10-CM

## 2015-08-03 DIAGNOSIS — R634 Abnormal weight loss: Secondary | ICD-10-CM

## 2015-08-03 DIAGNOSIS — M1711 Unilateral primary osteoarthritis, right knee: Secondary | ICD-10-CM

## 2015-08-03 DIAGNOSIS — G301 Alzheimer's disease with late onset: Secondary | ICD-10-CM | POA: Diagnosis not present

## 2015-08-03 DIAGNOSIS — F02818 Dementia in other diseases classified elsewhere, unspecified severity, with other behavioral disturbance: Secondary | ICD-10-CM

## 2015-08-03 NOTE — Progress Notes (Signed)
Patient ID: Bryan [name], male   DOB: 1923/01/08, 82 y.o.   MRN: [id number]   Location:  [facility] ([place]   Code Status:  DNR, no hospitalizations  Chief Complaint  Patient presents with  . Medical Management of Chronic Issues    HPI:  82 y.o. male  residing at [facility], memory care section. He has a hx of AD with behaviors, chronic venous insufficiency, diastolic CHF, dysphagia, afib, CKD, HLD, and OA.  He is non ambulatory due to AD and is dependent on the staff for all ADLs. 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  His weight has trended downward, currently at 223 lbs, an 18 lb loss since Nov. Staff reports that he is eating and drinking well.  Resident was started on baclofen due to rigidity and contracture of his legs and arms, as well as resistance when receiving care. 5 mg seemed to be oversedating so this was reduced to 2.5 mg BID and this seems to help with his personal care. Also on ultram 25 mg BID for knee pain, no reports of pain noted per staff. Has periods of agitation, currently using ativan 0.25 mg daily and depakote nightly. His caregiver reports that he continues with periods of agitation but is overall improved.  Has a hx of CHF and afib. BP and HR are acceptable per the records. Remains on eliquis for CVA risk reduction without s/e and stable H/H   Review of Systems:  Review of Systems  Unable to perform ROS: dementia      Medications: Patient's Medications  New Prescriptions   No medications on file  Previous Medications   APIXABAN (ELIQUIS) 5 MG TABS TABLET    Take 5 mg by mouth 2 (two) times daily.   BACLOFEN (LIORESAL) 5 MG TABS TABLET    Take 5 mg by mouth 2 (two) times daily. 1/2 tab   DIVALPROEX (DEPAKOTE SPRINKLE) 125 MG CAPSULE    Take 125 mg by mouth at bedtime.    FUROSEMIDE (LASIX) 40 MG TABLET    Take 40 mg by mouth 2 (two) times daily. 40 mg in the am, 20 mg in the pm   IPRATROPIUM (ATROVENT) 0.06 %  NASAL SPRAY    Place 2 sprays into both nostrils 3 (three) times daily.    KETOCONAZOLE (NIZORAL) 2 % SHAMPOO    Apply 1 application topically. Wash head, face, ears, three times a week, Mon, Thur, Sat   LORATADINE (CLARITIN) 10 MG TABLET    Take 10 mg by mouth daily.   LORAZEPAM (ATIVAN) 0.5 MG TABLET    Take 0.25 mg by mouth every morning.    NITROGLYCERIN (NITROSTAT) 0.4 MG SL TABLET    Place 0.4 mg under the tongue every 5 (five) minutes as needed for chest pain.   POLYETHYLENE GLYCOL (MIRALAX / GLYCOLAX) PACKET    Take 17 g by mouth daily.   POTASSIUM CHLORIDE SA (K-DUR,KLOR-CON) 20 MEQ TABLET    Take 20 mEq by mouth daily.   PROTEIN Renee Pain) POWD    Take 1 each by mouth daily.   TRAMADOL (ULTRAM) 50 MG TABLET    Take 25 mg by mouth 2 (two) times daily.  Modified Medications   No medications on file  Discontinued Medications   WARFARIN (COUMADIN) 4 MG TABLET    Take 4 mg by mouth daily.     Physical Exam: Filed Vitals:   08/03/15 1554  Weight: 223 lb (101.152 kg)  Wt Readings from Last 3 Encounters:  08/03/15 223 lb (101.152 kg)  06/23/15 236 lb (107.049 kg)  05/19/15 241 lb (109.317 kg)    Physical Exam  Constitutional: No distress.  HENT:  Head: Normocephalic and atraumatic.  Neck: No JVD present.  Cardiovascular: Normal rate.   No murmur heard. Irregular. 4+ edema BLE L>R  Pulmonary/Chest: Effort normal. No respiratory distress. He has wheezes. He has no rales.  Abdominal: Soft. Bowel sounds are normal. He exhibits no distension. There is no tenderness.  Musculoskeletal: He exhibits edema (right knee, no warmth, no redness).  Decreased ROM to both knees, hips, shoulders. Rigidity noted with ROM to all joints, also resisting exam  Neurological: He is alert.  Alert, non verbal, not able to f/c  Skin: Skin is warm and dry. He is not diaphoretic.  Psychiatric: Affect normal.     Labs/Studies reviewed:  CXR 09/01/14 reviewed IMPRESSION: Shallow inspiration with  atelectasis in the lung bases. Chronic bronchitic changes. Mild cardiac enlargement. No evidence of active Consolidation.  2D echo 06/2015 Study Conclusions  - Left ventricle: The cavity size was normal. There was mild focal basal hypertrophy of the septum. Systolic function was normal. The estimated ejection fraction was in the range of 50% to 55%. Although no diagnostic regional wall motion abnormality was identified, this possibility cannot be completely excluded on the basis of this study. - Aortic valve: Cusp separation was reduced. - Mitral valve: Calcified annulus. Mildly thickened leaflets . - Left atrium: The atrium was mildly dilated.  Basic Metabolic Panel:  Recent Labs  09/01/14 2352  03/09/15 03/16/15 06/20/15  NA 151*  < > 139 143 147  K 4.0  < > 4.4 5.0 4.3  CL 111  --   --   --   --   CO2 29  --   --   --   --   GLUCOSE 118*  --   --   --   --   BUN 48*  < > 25* 28* 30*  CREATININE 1.56*  < > 1.0 1.2 1.3  CALCIUM 8.7  --   --   --   --   < > = values in this interval not displayed.  CBC:  Recent Labs  09/01/14 2352 01/07/15 03/09/15 06/20/15  WBC 11.2* 8.0 7.8 7.7  NEUTROABS 6.0  --   --   --   HGB 14.5 12.9* 13.6 14.4  HCT 47.1 39* 41 41  MCV 98.3  --   --   --   PLT 208 209 201 252   Lab Results  Component Value Date   INR 1.8* 05/16/2015   INR 2.5* 03/02/2015   INR 2.8* 02/02/2015   PROTIME 27.0* 03/02/2015   PROTIME 26.3* 01/07/2015   PROTIME 37.2* 09/04/2014   Wt Readings from Last 3 Encounters:  08/03/15 223 lb (101.152 kg)  06/23/15 236 lb (107.049 kg)  05/19/15 241 lb (109.317 kg)    03/09/15: ferritin 85, TIBC 199, %sat 35, total iron 105  Assessment/Plan  1. Loss of weight -check TSH -monitor weight and intake  2. Chronic atrial fibrillation (HCC) -controlled rate without medication -continue eliquis and periodically monitor H/H  3. Late onset Alzheimer's disease with behavioral disturbance -severe, no longer  able to perform cognitive testing -no longer on meds -continue depakote, ativan  4. Primary osteoarthritis of right knee -no reports of pain -no increase in swelling ( no redness or warmth) -continue ultram, joint aspiration not indicated  5. Edema  of both legs -unchanged, continue lasix, elevation, and compression hose  6. Rigidity -difficult to assess efficacy of baclofen as he resisted my exam -continue for now, if staff reports rigidity during care would d/c as he did not tolerate higher dose  Labs: TSH  Discussed care with nursing supervisor and resident caretaker   Cindi Carbon, [place] [phone]

## 2015-08-08 DIAGNOSIS — E039 Hypothyroidism, unspecified: Secondary | ICD-10-CM | POA: Diagnosis not present

## 2015-08-08 LAB — TSH: TSH: 4.44 u[IU]/mL (ref 0.41–5.90)

## 2015-08-23 ENCOUNTER — Other Ambulatory Visit: Payer: Self-pay | Admitting: *Deleted

## 2015-08-23 MED ORDER — LORAZEPAM 0.5 MG PO TABS: ORAL_TABLET | ORAL | Status: DC

## 2015-08-23 NOTE — Telephone Encounter (Signed)
[place]

## 2015-08-29 ENCOUNTER — Non-Acute Institutional Stay (SKILLED_NURSING_FACILITY): Payer: Medicare Other | Admitting: Internal Medicine

## 2015-08-29 ENCOUNTER — Encounter: Payer: Self-pay | Admitting: Internal Medicine

## 2015-08-29 DIAGNOSIS — N183 Chronic kidney disease, stage 3 unspecified: Secondary | ICD-10-CM

## 2015-08-29 DIAGNOSIS — F0281 Dementia in other diseases classified elsewhere with behavioral disturbance: Secondary | ICD-10-CM | POA: Diagnosis not present

## 2015-08-29 DIAGNOSIS — I872 Venous insufficiency (chronic) (peripheral): Secondary | ICD-10-CM

## 2015-08-29 DIAGNOSIS — G301 Alzheimer's disease with late onset: Secondary | ICD-10-CM | POA: Diagnosis not present

## 2015-08-29 DIAGNOSIS — D638 Anemia in other chronic diseases classified elsewhere: Secondary | ICD-10-CM | POA: Diagnosis not present

## 2015-08-29 DIAGNOSIS — Z7901 Long term (current) use of anticoagulants: Secondary | ICD-10-CM

## 2015-08-29 DIAGNOSIS — F02818 Dementia in other diseases classified elsewhere, unspecified severity, with other behavioral disturbance: Secondary | ICD-10-CM

## 2015-08-29 DIAGNOSIS — I482 Chronic atrial fibrillation, unspecified: Secondary | ICD-10-CM

## 2015-08-29 NOTE — Progress Notes (Signed)
Patient ID: Bryan [name], male   DOB: Nov 09, 1922, 82 y.o.   MRN: [id number]  Location:  [facility] [place] Room Number: [place]:  SNF [phone]) Provider:  Kaho Selle L. Mariea Clonts, D.O., C.M.D.  Hollace Kinnier, DO  Patient Care Team: [name], DO as PCP - General (Geriatric Medicine) Well [place], NP as Nurse Practitioner (Nurse Practitioner)  Extended Emergency Contact Information Primary Emergency Contact: [name] Address: [address]          [place], [place] [address] [place] Phone: [phone] Relation: Spouse Secondary Emergency Contact: [name], [place] Phone: [phone] Relation: Daughter  Code Status:  DNR Goals of care: Advanced Directive information Advanced Directives 08/29/2015  Does patient have an advance directive? Yes  Type of Paramedic of [place];Living will;Out of facility DNR (pink MOST or yellow form)  Does patient want to make changes to advanced directive? -  Copy of advanced directive(s) in chart? Yes  Pre-existing out of facility DNR order (yellow form or pink MOST form) Yellow form placed in chart (order not valid for inpatient use);Pink MOST form placed in chart (order not valid for inpatient use)     Chief Complaint  Patient presents with  . Medical Management of Chronic Issues    routine    HPI:  Pt is a 82 y.o. male seen today for medical management of chronic diseases.  His wife was visiting him in the memory care unit. He was sitting up in his chair looking around.  Staff report no new concerns with him.  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  He takes baclofen to help with this spasticity and also has tramadol for pain.    He is on depakote sprinkles  for mood stabilization.  He has periods of sleepiness but this may be due to his dementia disease progression also.  He also gets a low dose of ativan early in the am due to agitation and combativeness during care.  The edema in his legs has recently been improved with more regular elevation.  He remains on lasix 40mg  po bid and kcl supplement.  Afib is rate controlled.  He came off coumadin several months ago due to need for frequent sticks and is now on eliquis 5mg  po bid.    He's been on atrovent nasal spray for a difficulty with rhinorrhea with meals.  Seems this is not a necessary intervention any longer and it's anticholinergic.    He's had no difficulty with shortness of breath or cough.  He has some wheezing if he is flat in the bed, but staff report this has been better lately (probably due to positional change).    He no longer speaks much.  He's either in bed or his high-backed wheelchair.  He is dependent in all adls.  Past Medical History  Diagnosis Date  . Hyperlipidemia   . Alzheimer's disease 2013  . GERD (gastroesophageal reflux disease)   . BPH (benign prostatic hyperplasia)   . Atrial fibrillation ([place])     Long term anti coagulation w/ warfarin fro stroke risk reduction  . 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 Lactose intolerance   . Allergic rhinitis   . Long term (current) use  of anticoagulants 11/23/2012    Long-term anticoagulation with warfarin for stroke risk reduction related to AF  . Gait disorder   . Chronic venous insufficiency 01/12/2013  . Dementia with behavioral disturbance 12/29/13   Past Surgical History  Procedure Laterality Date  . Cataract extraction w/ intraocular lens  implant, bilateral    . Hernia repair Right 1970s  . Retinal detachment surgery Right 2009  . Knee surgery Right 2000  . Mass excision Left 07/30/2013    Procedure: EXCISION OF BASAL CELL CARCINOMA  LEFT FOREHEAD ;  Surgeon: Irene Limbo, MD;  Location:  [facility];  Service: Plastics;  Laterality: Left;    Allergies  Allergen Reactions  . Penicillins Rash  . Bee Venom   . Risperidone And Related     Unstable gait      Medication List       This list is accurate as of: 08/29/15 11:59 PM.  Always use your most recent med list.               baclofen 5 mg Tabs tablet  Commonly known as:  LIORESAL  Take 5 mg by mouth 2 (two) times daily. 1/2 tab     divalproex 125 MG capsule  Commonly known as:  DEPAKOTE SPRINKLE  Take 125 mg by mouth at bedtime.     ELIQUIS 5 MG Tabs tablet  Generic drug:  apixaban  Take 5 mg by mouth 2 (two) times daily.     furosemide 40 MG tablet  Commonly known as:  LASIX  Take 40 mg by mouth 2 (two) times daily. 40 mg in the am, 20 mg in the pm     ipratropium-albuterol 0.5-2.5 (3) MG/3ML Soln  Commonly known as:  DUONEB  Take 3 mLs by nebulization. Nebulization give every 6 hours as needed for shortness of breath or wheezing     ketoconazole 2 % shampoo  Commonly known as:  NIZORAL  Apply 1 application topically. Wash head, face, ears, three times a week, Mon, Thur, Sat     loratadine 10 MG tablet  Commonly known as:  CLARITIN  Take 10 mg by mouth daily.     LORazepam 0.5 MG tablet  Commonly known as:  ATIVAN  Take 1/2 tablet by mouth once daily at 6am     nitroGLYCERIN 0.4 MG SL tablet  Commonly known as:  NITROSTAT  Place 0.4 mg under the tongue every 5 (five) minutes as needed for chest pain.     polyethylene glycol packet  Commonly known as:  MIRALAX / GLYCOLAX  Take 17 g by mouth daily.     potassium chloride SA 20 MEQ tablet  Commonly known as:  K-DUR,KLOR-CON  Take 20 mEq by mouth daily.     ULTRAM 50 MG tablet  Generic drug:  traMADol  Take 25 mg by mouth 2 (two) times daily.     UNJURY Powd  Take 1 each by mouth daily.       ROS below obtained from nursing and pt's wife Review of Systems  Unable to perform ROS: dementia  Constitutional: Positive  for malaise/fatigue. Negative for fever and chills.  HENT: Negative for congestion and hearing loss.   Eyes: Negative for blurred vision.  Respiratory: Negative for shortness of breath.   Cardiovascular: Positive for leg swelling. Negative for chest pain.       Decreased  Gastrointestinal: Negative for abdominal pain, blood in stool and melena.  Genitourinary: Negative for dysuria.  Incontinence  Musculoskeletal: Negative for falls.       Contractures of hips  Skin: Negative for rash.  Neurological: Positive for weakness. Negative for dizziness.  Psychiatric/Behavioral: Positive for memory loss.    Immunization History  Administered Date(s) Administered  . Influenza Whole 04/20/2013  . Influenza-Unspecified 04/12/2014, 04/20/2015  . PPD Test 11/29/2012   Pertinent  Health Maintenance Due  Topic Date Due  . PNA vac Low Risk Adult (1 of 2 - PCV13) 06/08/1990  . INFLUENZA VACCINE  02/06/2016   No flowsheet data found. Functional Status Survey: Is the patient deaf or have difficulty hearing?: No Does the patient have difficulty seeing, even when wearing glasses/contacts?: No Does the patient have difficulty concentrating, remembering, or making decisions?: Yes Does the patient have difficulty walking or climbing stairs?: Yes Does the patient have difficulty dressing or bathing?: Yes Does the patient have difficulty doing errands alone such as visiting a doctor's office or shopping?: Yes  Filed Vitals:   08/29/15 1649  BP: 129/80  Pulse: 98  Resp: 20  Height: 6' (1.829 m)  Weight: 222 lb (100.699 kg)  SpO2: 97%   Body mass index is 30.1 kg/(m^2). Physical Exam  Constitutional: He appears well-developed and well-nourished. No distress.  Cardiovascular:  irreg irreg  Pulmonary/Chest: Effort normal. He has no wheezes. He has no rales. He exhibits no tenderness.  Sitting upright  Abdominal: Soft. Bowel sounds are normal. He exhibits no distension. There is no  tenderness.  Musculoskeletal:  Contractures at hips bilaterally; requires lift and uses wheelchair and another person to get around  Neurological: He is alert.  Skin: Skin is warm and dry.    Labs reviewed:  Recent Labs  03/09/15 03/16/15 06/20/15  NA 139 143 147  K 4.4 5.0 4.3  BUN 25* 28* 30*  CREATININE 1.0 1.2 1.3    Recent Labs  06/20/15  AST 14  ALT 13  ALKPHOS 88    Recent Labs  01/07/15 03/09/15 06/20/15  WBC 8.0 7.8 7.7  HGB 12.9* 13.6 14.4  HCT 39* 41 41  PLT 209 201 252   Lab Results  Component Value Date   TSH 4.44 08/08/2015   No results found for: HGBA1C Lab Results  Component Value Date   CHOL 117 01/25/2014   HDL 41 01/25/2014   LDLCALC 51 01/25/2014   TRIG 126 01/25/2014    Assessment/Plan 1. Chronic atrial fibrillation (HCC) -cont eliquis blood thinner -f/u renal function and cbc  2. Late onset Alzheimer's disease with behavioral disturbance -cont memory care SNF level care -no longer of meds for the dementia itself, but is on depakote for mood stabilization--f/u depakote level  3. CKD (chronic kidney disease) stage 3, GFR 30-59 ml/min -f/u bmp to reassess due to eliquis use, but weight will likely keep him on 5mg  po bid  4. Chronic venous insufficiency -continue elevating legs, lasix and potassium, doing better lately  5. Current use of long term anticoagulation -continues on eliquis, monitor  6. Anemia of chronic disease -f/u cbc due to blood thinner use and renal failure  Family/ staff Communication: discussed with his wife and nursing staff of memory care  Labs/tests ordered:  Cbc, bmp, depakote level next month   Drevin Ortner L. Vania Rosero, D.O. [facility] [address], [place] [address] Cell Phone (Mon-Fri 8am-5pm):  [phone] On Call:  [phone] & follow prompts after 5pm & weekends Office Phone:  [phone] Office Fax:  [phone]

## 2015-08-31 DIAGNOSIS — E785 Hyperlipidemia, unspecified: Secondary | ICD-10-CM | POA: Diagnosis not present

## 2015-08-31 DIAGNOSIS — Z79899 Other long term (current) drug therapy: Secondary | ICD-10-CM | POA: Diagnosis not present

## 2015-08-31 LAB — LIPID PANEL
Cholesterol: 155 mg/dL (ref 0–200)
HDL: 33 mg/dL — AB (ref 35–70)
LDL Cholesterol: 99 mg/dL
Triglycerides: 116 mg/dL (ref 40–160)

## 2015-08-31 LAB — HEPATIC FUNCTION PANEL
ALK PHOS: 99 U/L (ref 25–125)
ALT: 12 U/L (ref 10–40)
AST: 15 U/L (ref 14–40)
BILIRUBIN DIRECT: 0.2 mg/dL
BILIRUBIN, TOTAL: 0.7 mg/dL

## 2015-09-26 DIAGNOSIS — I872 Venous insufficiency (chronic) (peripheral): Secondary | ICD-10-CM | POA: Diagnosis not present

## 2015-09-26 DIAGNOSIS — I482 Chronic atrial fibrillation: Secondary | ICD-10-CM | POA: Diagnosis not present

## 2015-09-26 DIAGNOSIS — D638 Anemia in other chronic diseases classified elsewhere: Secondary | ICD-10-CM | POA: Diagnosis not present

## 2015-09-26 LAB — CBC AND DIFFERENTIAL
HCT: 42 % (ref 41–53)
Hemoglobin: 14.7 g/dL (ref 13.5–17.5)
Platelets: 242 10*3/uL (ref 150–399)
WBC: 8.2 10*3/mL

## 2015-09-26 LAB — BASIC METABOLIC PANEL
BUN: 28 mg/dL — AB (ref 4–21)
CREATININE: 1.3 mg/dL (ref 0.6–1.3)
Glucose: 90 mg/dL
Potassium: 4.3 mmol/L (ref 3.4–5.3)
Sodium: 143 mmol/L (ref 137–147)

## 2015-10-09 ENCOUNTER — Encounter: Payer: Self-pay | Admitting: Adult Health

## 2015-10-09 ENCOUNTER — Non-Acute Institutional Stay (SKILLED_NURSING_FACILITY): Payer: Medicare Other | Admitting: Adult Health

## 2015-10-09 DIAGNOSIS — M1711 Unilateral primary osteoarthritis, right knee: Secondary | ICD-10-CM

## 2015-10-09 DIAGNOSIS — N183 Chronic kidney disease, stage 3 unspecified: Secondary | ICD-10-CM

## 2015-10-09 DIAGNOSIS — I872 Venous insufficiency (chronic) (peripheral): Secondary | ICD-10-CM

## 2015-10-09 DIAGNOSIS — F0391 Unspecified dementia with behavioral disturbance: Secondary | ICD-10-CM

## 2015-10-09 DIAGNOSIS — I482 Chronic atrial fibrillation, unspecified: Secondary | ICD-10-CM

## 2015-10-09 DIAGNOSIS — R29898 Other symptoms and signs involving the musculoskeletal system: Secondary | ICD-10-CM | POA: Diagnosis not present

## 2015-10-09 DIAGNOSIS — F03918 Unspecified dementia, unspecified severity, with other behavioral disturbance: Secondary | ICD-10-CM

## 2015-10-09 NOTE — Progress Notes (Signed)
Patient ID: Bryan [name], male   DOB: 1922-08-20, 82 y.o.   MRN: [id number]   Location:  [facility] ([facility]) SNF  Patient Care Team: [name], DO as PCP - General (Geriatric Medicine) Well [place], NP as Nurse Practitioner (Nurse Practitioner)  Code Status:  DNR, no hospitalizations  CC: Medical management of chronic diseases  HPI:  82 y.o. male  residing at [facility], memory care section. He has a hx of AD with behaviors, chronic venous insufficiency, diastolic CHF, dysphagia, afib, CKD, HLD, and OA.  He is non ambulatory due to AD and is dependent on the staff for all ADLs. 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  His weight has trended downward, currently at 225 lbs, his TSH was checked and is 4.44. Resident was started on baclofen due to rigidity and contracture of his legs and arms, ativan for agitation during personal care, as well as depakote. His caretaker reports that his agitation is unchanged with personal care, however, the staff seems to think it has improved to a degree. He has OA with joint swelling to the right knee and is ultram for this reason. He is minimally verbal and can not contribute to the hx.  Hx of dysphagia and is on mech soft with NTL. No recent issues with aspiration pna or choking noted.    Has a hx of CHF and afib. BP and HR are acceptable per the records. Remains on eliquis for CVA risk reduction without s/e and stable H/H   Review of Systems:  Review of Systems  Unable to perform ROS: dementia      Medications: Patient's Medications  New Prescriptions   No medications on file  Previous Medications   APIXABAN (ELIQUIS) 5 MG TABS TABLET    Take 5 mg by mouth 2 (two) times daily.   BACLOFEN (LIORESAL) 5 MG TABS TABLET    Take 5 mg by mouth 2 (two) times daily. 1/2 tab   DIVALPROEX (DEPAKOTE SPRINKLE) 125 MG CAPSULE    Take 125 mg by mouth at bedtime.    FUROSEMIDE (LASIX) 40  MG TABLET    Take 40 mg by mouth 2 (two) times daily. 40 mg in the am, 20 mg in the pm   IPRATROPIUM-ALBUTEROL (DUONEB) 0.5-2.5 (3) MG/3ML SOLN    Take 3 mLs by nebulization. Nebulization give every 6 hours as needed for shortness of breath or wheezing   KETOCONAZOLE (NIZORAL) 2 % SHAMPOO    Apply 1 application topically. Wash head, face, ears, three times a week, Mon, Thur, Sat   LORATADINE (CLARITIN) 10 MG TABLET    Take 10 mg by mouth daily.   LORAZEPAM (ATIVAN) 0.5 MG TABLET    Take 1/2 tablet by mouth once daily at 6am   NITROGLYCERIN (NITROSTAT) 0.4 MG SL TABLET    Place 0.4 mg under the tongue every 5 (five) minutes as needed for chest pain.   POLYETHYLENE GLYCOL (MIRALAX / GLYCOLAX) PACKET    Take 17 g by mouth daily.   POTASSIUM CHLORIDE SA (K-DUR,KLOR-CON) 20 MEQ TABLET    Take 20 mEq by mouth daily.   PROTEIN Renee Pain) POWD    Take 1 each by mouth daily.   TRAMADOL (ULTRAM) 50 MG TABLET    Take 25 mg by mouth 2 (two) times daily.  Modified Medications   No medications on file  Discontinued Medications   No medications on file    Physical Exam: Filed Vitals:  10/09/15 1158  BP: 127/92  Pulse: 77  Temp: 96.7 F (35.9 C)  Resp: 20  Weight: 225 lb (102.059 kg)  SpO2: 100%    Wt Readings from Last 3 Encounters:  10/09/15 225 lb (102.059 kg)  08/29/15 222 lb (100.699 kg)  08/03/15 223 lb (101.152 kg)    Physical Exam  Constitutional: No distress.  HENT:  Head: Normocephalic and atraumatic.  Neck: No JVD present.  Cardiovascular: Normal rate.   No murmur heard. Irregular. 4+ edema BLE L>R  Pulmonary/Chest: Effort normal. No respiratory distress. He has wheezes. He has no rales.  Abdominal: Soft. Bowel sounds are normal. He exhibits no distension. There is no tenderness.  Musculoskeletal: He exhibits edema (right knee, no warmth, no redness).  Decreased ROM to both knees, hips, shoulders. Rigidity noted with ROM to all joints, also resisting exam  Neurological: He is  alert.  Alert, non verbal, not able to f/c  Skin: Skin is warm and dry. He is not diaphoretic.  Psychiatric: Affect normal.     Labs/Studies reviewed:  CXR 09/01/14 reviewed IMPRESSION: Shallow inspiration with atelectasis in the lung bases. Chronic bronchitic changes. Mild cardiac enlargement. No evidence of active Consolidation.  2D echo 06/2015 Study Conclusions  - Left ventricle: The cavity size was normal. There was mild focal basal hypertrophy of the septum. Systolic function was normal. The estimated ejection fraction was in the range of 50% to 55%. Although no diagnostic regional wall motion abnormality was identified, this possibility cannot be completely excluded on the basis of this study. - Aortic valve: Cusp separation was reduced. - Mitral valve: Calcified annulus. Mildly thickened leaflets . - Left atrium: The atrium was mildly dilated.  Basic Metabolic Panel:  Recent Labs  03/16/15 06/20/15 09/26/15  NA 143 147 143  K 5.0 4.3 4.3  BUN 28* 30* 28*  CREATININE 1.2 1.3 1.3    CBC:  Recent Labs  03/09/15 06/20/15 09/26/15  WBC 7.8 7.7 8.2  HGB 13.6 14.4 14.7  HCT 41 41 42  PLT 201 252 242    Wt Readings from Last 3 Encounters:  10/09/15 225 lb (102.059 kg)  08/29/15 222 lb (100.699 kg)  08/03/15 223 lb (101.152 kg)   Lab Results  Component Value Date   CHOL 155 08/31/2015   HDL 33* 08/31/2015   LDLCALC 99 08/31/2015   TRIG 116 08/31/2015     03/09/15: ferritin 85, TIBC 199, %sat 35, total iron 105 09/26/15: Depakote <12.5  Assessment/Plan  1. Dementia with behavioral disturbance Late stage with weight loss No longer on meds Continue supportive care  2. Chronic venous insufficiency -stable -continue lasix, elevation, and compression hose  3. Chronic atrial fibrillation (HCC) -rate controlled -on eliquis for CVA risk reduction -H/H stable  4. Rigidity -small doses of baclofen do not seem to be helping -larges doses  have led to increased sedation -taper off baclofen  5. CKD (chronic kidney disease) stage 3, GFR 30-59 ml/min -stable, continue to monitor  6. Primary osteoarthritis of right knee -remains with right knee swelling -continue ultram     Cindi Carbon, [place] [phone]

## 2015-11-07 ENCOUNTER — Non-Acute Institutional Stay (SKILLED_NURSING_FACILITY): Payer: Medicare Other | Admitting: Internal Medicine

## 2015-11-07 ENCOUNTER — Encounter: Payer: Self-pay | Admitting: Internal Medicine

## 2015-11-07 DIAGNOSIS — N183 Chronic kidney disease, stage 3 unspecified: Secondary | ICD-10-CM

## 2015-11-07 DIAGNOSIS — I872 Venous insufficiency (chronic) (peripheral): Secondary | ICD-10-CM | POA: Diagnosis not present

## 2015-11-07 DIAGNOSIS — I482 Chronic atrial fibrillation, unspecified: Secondary | ICD-10-CM

## 2015-11-07 DIAGNOSIS — R062 Wheezing: Secondary | ICD-10-CM | POA: Diagnosis not present

## 2015-11-07 DIAGNOSIS — J811 Chronic pulmonary edema: Secondary | ICD-10-CM | POA: Diagnosis not present

## 2015-11-07 NOTE — Progress Notes (Signed)
Patient ID: Bryan [name], male   DOB: May 06, 1923, 82 y.o.   MRN: [id number]   Location:  [facility] Room Number: [id number] Place of Service:  SNF (31) Provider:   Hollace Kinnier, DO  Patient Care Team: [name], DO as PCP - General (Geriatric Medicine) Well [place], NP as Nurse Practitioner (Nurse Practitioner)  Extended Emergency Contact Information Primary Emergency Contact: [name] Address: [address]          [place], [place] [address] [place] Phone: [phone] Relation: Spouse Secondary Emergency Contact: [name], [place] Phone: [phone] Relation: Daughter  Code Status:  DNR Goals of care: Advanced Directive information Advanced Directives 11/07/2015  Does patient have an advance directive? Yes  Type of Advance Directive Out of facility DNR (pink MOST or yellow form);[place];Living will  Copy of advanced directive(s) in chart? -  Pre-existing out of facility DNR order (yellow form or pink MOST form) Yellow form placed in chart (order not valid for inpatient use);Pink MOST form placed in chart (order not valid for inpatient use)   Chief Complaint  Patient presents with  . Acute Visit    wheezing   HPI:  Pt is a 82 y.o. male with late stage dementia, CHF, CKD seen today for an acute visit for slight weight gain, but primarily increased audible wheezing even in the upright position and persisting after 2 duonebs at 1130pm last night and 5:30am this am.  He's been tachycardic and tachypneic, as well.  His edema of his legs has worsened.  Weights:  224.4 before breakfast yesterday and 226.2 lbs today after breakfast (so unclear if true weight gain).     Past Medical History  Diagnosis Date  . Hyperlipidemia   . Alzheimer's disease 2013  . GERD (gastroesophageal reflux disease)   . BPH  (benign prostatic hyperplasia)   . Atrial fibrillation ([place])     Long term anti coagulation w/ warfarin fro stroke risk reduction  . 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 Lactose intolerance   . Allergic rhinitis   . Long term (current) use of anticoagulants 11/23/2012    Long-term anticoagulation with warfarin for stroke risk reduction related to AF  . Gait disorder   . Chronic venous insufficiency 01/12/2013  . Dementia with behavioral disturbance 12/29/13   Past Surgical History  Procedure Laterality Date  . Cataract extraction w/ intraocular lens  implant, bilateral    . Hernia repair Right 1970s  . Retinal detachment surgery Right 2009  . Knee surgery Right 2000  . Mass excision Left 07/30/2013    Procedure: EXCISION OF BASAL CELL CARCINOMA  LEFT FOREHEAD ;  Surgeon: Irene Limbo, MD;  Location: [facility];  Service: Plastics;  Laterality: Left;    Allergies  Allergen Reactions  . Penicillins Rash  . Bee Venom   . Risperidone And Related     Unstable gait      Medication List       This list is accurate as of: 11/07/15  4:38 PM.  Always use your most recent med list.               ciclopirox 0.77 % cream  Commonly known as:  LOPROX  Apply topically 2 (two) times daily as needed (redness).     divalproex 125 MG  capsule  Commonly known as:  DEPAKOTE SPRINKLE  Take 125 mg by mouth at bedtime.     ELIQUIS 5 MG Tabs tablet  Generic drug:  apixaban  Take 5 mg by mouth 2 (two) times daily.     furosemide 40 MG tablet  Commonly known as:  LASIX  Take 40 mg by mouth 2 (two) times daily. 40 mg in the am, 20 mg in the pm     ipratropium-albuterol 0.5-2.5 (3) MG/3ML Soln  Commonly known as:  DUONEB  Take 3 mLs by nebulization. Nebulization give every 6 hours as needed for shortness of breath or wheezing     loratadine 10 MG tablet  Commonly known as:  CLARITIN  Take 10 mg by mouth daily.     LORazepam 0.5 MG tablet  Commonly  known as:  ATIVAN  Take 1/2 tablet by mouth once daily at 6am     nitroGLYCERIN 0.4 MG SL tablet  Commonly known as:  NITROSTAT  Place 0.4 mg under the tongue every 5 (five) minutes as needed for chest pain.     polyethylene glycol packet  Commonly known as:  MIRALAX / GLYCOLAX  Take 17 g by mouth daily.     potassium chloride SA 20 MEQ tablet  Commonly known as:  K-DUR,KLOR-CON  Take 20 mEq by mouth daily.     ULTRAM 50 MG tablet  Generic drug:  traMADol  Take 25 mg by mouth 2 (two) times daily.     UNJURY Powd  Take 1 each by mouth daily.       Review of Systems  Constitutional: Negative for fever, chills, activity change and appetite change.  HENT: Positive for congestion. Negative for sneezing.   Respiratory: Positive for cough, shortness of breath, wheezing and stridor. Negative for choking.   Cardiovascular: Positive for leg swelling.  Gastrointestinal: Negative for abdominal pain.  Skin: Negative for color change.  Psychiatric/Behavioral: Positive for confusion.       Chronic    Immunization History  Administered Date(s) Administered  . Influenza Whole 04/20/2013  . Influenza-Unspecified 04/12/2014, 04/20/2015  . PPD Test 11/29/2012   Pertinent  Health Maintenance Due  Topic Date Due  . PNA vac Low Risk Adult (1 of 2 - PCV13) 06/08/1990  . INFLUENZA VACCINE  02/06/2016   No flowsheet data found. Functional Status Survey: Is the patient deaf or have difficulty hearing?: Yes Does the patient have difficulty seeing, even when wearing glasses/contacts?: No Does the patient have difficulty concentrating, remembering, or making decisions?: Yes Does the patient have difficulty walking or climbing stairs?: Yes Does the patient have difficulty dressing or bathing?: Yes Does the patient have difficulty doing errands alone such as visiting a doctor's office or shopping?: Yes  Filed Vitals:   11/07/15 1524  BP: 136/90  Pulse: 104  Temp: 97 F (36.1 C)  TempSrc:  Oral  Resp: 28  Weight: 226 lb (102.513 kg)  SpO2: 94%   Body mass index is 30.64 kg/(m^2). Physical Exam  Constitutional: He appears well-developed and well-nourished. No distress.  Obese white male resting in chair  Cardiovascular:  During visit (was tachy this am in vitals); chronic edema of bilateral lower legs and feet; slight increase in feet; irreg irreg  Pulmonary/Chest: Effort normal. He has wheezes.  Coarse rhonchi and wheezes which seem upper airway in origin; pt unable to cough  Abdominal: Soft. Bowel sounds are normal.  Musculoskeletal:  Required two CNAs to help sit him up for lung exam  Neurological: He is alert.  Skin: Skin is warm and dry.    Labs reviewed:  Recent Labs  03/16/15 06/20/15 09/26/15  NA 143 147 143  K 5.0 4.3 4.3  BUN 28* 30* 28*  CREATININE 1.2 1.3 1.3    Recent Labs  06/20/15 08/31/15  AST 14 15  ALT 13 12  ALKPHOS 88 99    Recent Labs  03/09/15 06/20/15 09/26/15  WBC 7.8 7.7 8.2  HGB 13.6 14.4 14.7  HCT 41 41 42  PLT 201 252 242   Lab Results  Component Value Date   TSH 4.44 08/08/2015   No results found for: HGBA1C Lab Results  Component Value Date   CHOL 155 08/31/2015   HDL 33* 08/31/2015   LDLCALC 99 08/31/2015   TRIG 116 08/31/2015   Assessment/Plan 1. Wheezing -with tachypnea, rhonchi throughout the day worse than baseline -had gotten a couple of nebs, but none since early this am when I saw him -obtain CXR to r/o pneumonia with his cough, congestion, wheezing (doubt chf without significant weight change or overt change in edema to my eye); his wife also has had a recent URI -schedule duonebs q6hr for 3 days while awake and mucinex 600mg  po bid for 7 days -sounds like he needs suctioning, but he is sometimes aggressive during care so it might be more dangerous than helpful  2. Chronic venous insufficiency -slight increase in edema in feet, but he was sitting up in an activity when I pulled him out for the  visit  3. CKD (chronic kidney disease) stage 3, GFR 30-59 ml/min -has been stable lately with lasix 40mg  am and 20mg  pm, but don't want to diurese more unless I"m certain he's having chf exacerbation due to risk of ARF  4. Chronic atrial fibrillation (HCC) -was rapid this am after the nebs, but improved HR for visit -cont to monitor  Family/ staff Communication: discussed with his wife who was visiting him and [employer] nurse  Labs/tests ordered:  CXR only at this time to determine cause

## 2015-11-17 DIAGNOSIS — R1312 Dysphagia, oropharyngeal phase: Secondary | ICD-10-CM | POA: Diagnosis not present

## 2015-11-17 DIAGNOSIS — G301 Alzheimer's disease with late onset: Secondary | ICD-10-CM | POA: Diagnosis not present

## 2015-11-20 DIAGNOSIS — G301 Alzheimer's disease with late onset: Secondary | ICD-10-CM | POA: Diagnosis not present

## 2015-11-20 DIAGNOSIS — R1312 Dysphagia, oropharyngeal phase: Secondary | ICD-10-CM | POA: Diagnosis not present

## 2015-11-21 DIAGNOSIS — R1312 Dysphagia, oropharyngeal phase: Secondary | ICD-10-CM | POA: Diagnosis not present

## 2015-11-21 DIAGNOSIS — G301 Alzheimer's disease with late onset: Secondary | ICD-10-CM | POA: Diagnosis not present

## 2015-11-22 DIAGNOSIS — G301 Alzheimer's disease with late onset: Secondary | ICD-10-CM | POA: Diagnosis not present

## 2015-11-22 DIAGNOSIS — R1312 Dysphagia, oropharyngeal phase: Secondary | ICD-10-CM | POA: Diagnosis not present

## 2015-11-23 DIAGNOSIS — G301 Alzheimer's disease with late onset: Secondary | ICD-10-CM | POA: Diagnosis not present

## 2015-11-23 DIAGNOSIS — R1312 Dysphagia, oropharyngeal phase: Secondary | ICD-10-CM | POA: Diagnosis not present

## 2015-12-07 ENCOUNTER — Other Ambulatory Visit: Payer: Self-pay

## 2015-12-07 MED ORDER — TRAMADOL HCL 50 MG PO TABS: 25.0000 mg | ORAL_TABLET | Freq: Two times a day (BID) | ORAL | Status: DC

## 2015-12-07 NOTE — Telephone Encounter (Signed)
Prescription request was received from:    [facility] [address] [place] [address]  Phone: [phone] Fax: [phone] 

## 2015-12-14 ENCOUNTER — Non-Acute Institutional Stay (SKILLED_NURSING_FACILITY): Payer: Medicare Other | Admitting: Adult Health

## 2015-12-14 DIAGNOSIS — R1314 Dysphagia, pharyngoesophageal phase: Secondary | ICD-10-CM | POA: Diagnosis not present

## 2015-12-14 DIAGNOSIS — N183 Chronic kidney disease, stage 3 unspecified: Secondary | ICD-10-CM

## 2015-12-14 DIAGNOSIS — I482 Chronic atrial fibrillation, unspecified: Secondary | ICD-10-CM

## 2015-12-14 DIAGNOSIS — I872 Venous insufficiency (chronic) (peripheral): Secondary | ICD-10-CM | POA: Diagnosis not present

## 2015-12-14 DIAGNOSIS — J189 Pneumonia, unspecified organism: Secondary | ICD-10-CM

## 2015-12-14 DIAGNOSIS — M1711 Unilateral primary osteoarthritis, right knee: Secondary | ICD-10-CM

## 2015-12-15 ENCOUNTER — Encounter: Payer: Self-pay | Admitting: Adult Health

## 2015-12-15 DIAGNOSIS — R05 Cough: Secondary | ICD-10-CM | POA: Diagnosis not present

## 2015-12-15 NOTE — Progress Notes (Signed)
Patient ID: Bryan [name], male   DOB: 1923/06/28, 82 y.o.   MRN: [id number]   Location:  [facility]  Patient Care Team: [name], DO as PCP - General (Geriatric Medicine) Well [place], NP as Nurse Practitioner (Nurse Practitioner)  Code Status:  DNR, no hospitalizations  Chief Complaint  Patient presents with  . Medical Management of Chronic Issues    HPI:  82 y.o. male  residing at [facility], memory care section. He has a hx of AD with behaviors, chronic venous insufficiency, diastolic CHF, dysphagia, afib, CKD, HLD, and OA.  He is non ambulatory due to AD and is dependent on the staff for all ADLs. 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  His weight has trended downward, currently at 224 lbs. He has OA with joint swelling to the right knee and is ultram for this reason. He is minimally verbal and can not contribute to the hx.  Hx of dysphagia and is on mech soft with NTL. No recent issues with aspiration pna or choking noted.   he was treated in May for pneumonia and staff denies any fever, sob, cough, etc now.   Has a hx of CHF and afib. BP and HR are acceptable per the records. Remains on eliquis for CVA risk reduction without s/e and stable H/H   Review of Systems:  Review of Systems  Unable to perform ROS: dementia      Medications: Patient's Medications  New Prescriptions   No medications on file  Previous Medications   APIXABAN (ELIQUIS) 5 MG TABS TABLET    Take 5 mg by mouth 2 (two) times daily.   CICLOPIROX (LOPROX) 0.77 % CREAM    Apply topically 2 (two) times daily as needed (redness).   DIVALPROEX (DEPAKOTE SPRINKLE) 125 MG CAPSULE    Take 125 mg by mouth at bedtime.    FUROSEMIDE (LASIX) 40 MG TABLET    Take 40 mg by mouth 2 (two) times daily. 40 mg in the am, 20 mg in the pm   IPRATROPIUM-ALBUTEROL (DUONEB) 0.5-2.5 (3) MG/3ML SOLN    Take 3 mLs by nebulization. Nebulization  give every 6 hours as needed for shortness of breath or wheezing   LORATADINE (CLARITIN) 10 MG TABLET    Take 10 mg by mouth daily.   LORAZEPAM (ATIVAN) 0.5 MG TABLET    Take 1/2 tablet by mouth once daily at 6am   NITROGLYCERIN (NITROSTAT) 0.4 MG SL TABLET    Place 0.4 mg under the tongue every 5 (five) minutes as needed for chest pain.   POLYETHYLENE GLYCOL (MIRALAX / GLYCOLAX) PACKET    Take 17 g by mouth daily.   POTASSIUM CHLORIDE SA (K-DUR,KLOR-CON) 20 MEQ TABLET    Take 20 mEq by mouth daily.   PROTEIN Renee Pain) POWD    Take 1 each by mouth daily.   TRAMADOL (ULTRAM) 50 MG TABLET    Take 0.5 tablets (25 mg total) by mouth 2 (two) times daily.  Modified Medications   No medications on file  Discontinued Medications   No medications on file    Physical Exam: Filed Vitals:   12/14/15 1640  BP: 128/80  Pulse: 94  Temp: 98.2 F (36.8 C)  Resp: 22  Weight: 224 lb (101.606 kg)  SpO2: 97%    Wt Readings from Last 3 Encounters:  12/14/15 224 lb (101.606 kg)  11/07/15 226 lb (102.513 kg)  10/09/15 225 lb (102.059  kg)    Physical Exam  Constitutional: No distress.  HENT:  Head: Normocephalic and atraumatic.  Neck: No JVD present.  Cardiovascular: Normal rate.   No murmur heard. Irregular. 4+ edema BLE L>R  Pulmonary/Chest: Effort normal. No respiratory distress. He has no wheezes. He has no rales.  Abdominal: Soft. Bowel sounds are normal. He exhibits no distension. There is no tenderness.  Musculoskeletal: He exhibits edema (right knee, no warmth, no redness).  Decreased ROM to both knees, hips, shoulders. Rigidity noted with ROM to all joints, also resisting exam  Neurological: He is alert.  Alert, non verbal, not able to f/c  Skin: Skin is warm and dry. He is not diaphoretic.  Psychiatric: Affect normal.     Labs/Studies reviewed:   2D echo 06/2015 Study Conclusions  - Left ventricle: The cavity size was normal. There was mild focal basal hypertrophy of the  septum. Systolic function was normal. The estimated ejection fraction was in the range of 50% to 55%. Although no diagnostic regional wall motion abnormality was identified, this possibility cannot be completely excluded on the basis of this study. - Aortic valve: Cusp separation was reduced. - Mitral valve: Calcified annulus. Mildly thickened leaflets . - Left atrium: The atrium was mildly dilated.  Basic Metabolic Panel:  Recent Labs  03/16/15 06/20/15 09/26/15  NA 143 147 143  K 5.0 4.3 4.3  BUN 28* 30* 28*  CREATININE 1.2 1.3 1.3    CBC:  Recent Labs  03/09/15 06/20/15 09/26/15  WBC 7.8 7.7 8.2  HGB 13.6 14.4 14.7  HCT 41 41 42  PLT 201 252 242    Wt Readings from Last 3 Encounters:  12/14/15 224 lb (101.606 kg)  11/07/15 226 lb (102.513 kg)  10/09/15 225 lb (102.059 kg)   Lab Results  Component Value Date   CHOL 155 08/31/2015   HDL 33* 08/31/2015   LDLCALC 99 08/31/2015   TRIG 116 08/31/2015     03/09/15: ferritin 85, TIBC 199, %sat 35, total iron 105 09/26/15: Depakote <12.5  Assessment/Plan  1. Dysphagia, pharyngoesophageal phase -continues to be at risk for aspiration but seems to tolerating current diet -continue mech soft with NTL -asp prec -no feeding tubes per family request  2. CKD (chronic kidney disease) stage 3, GFR 30-59 ml/min -unchanged -continue to monitor  3. Chronic atrial fibrillation (HCC) -rate controlled without meds -continue eliquis for CVA risk reduction  4. Primary osteoarthritis of right knee -no s/s of pain -continue ultram  5. Chronic venous insufficiency -stable but significant edema -weight trending down -continue lasix 40 in am, 20 in pm  6. Health care associated pneumonia -resolved symptoms -check f/u CXR    Cindi Carbon, [place] [phone]

## 2016-01-26 ENCOUNTER — Encounter: Payer: Self-pay | Admitting: Adult Health

## 2016-01-26 ENCOUNTER — Non-Acute Institutional Stay (SKILLED_NURSING_FACILITY): Payer: Medicare Other | Admitting: Adult Health

## 2016-01-26 DIAGNOSIS — N183 Chronic kidney disease, stage 3 unspecified: Secondary | ICD-10-CM

## 2016-01-26 DIAGNOSIS — I482 Chronic atrial fibrillation, unspecified: Secondary | ICD-10-CM

## 2016-01-26 DIAGNOSIS — I5032 Chronic diastolic (congestive) heart failure: Secondary | ICD-10-CM | POA: Diagnosis not present

## 2016-01-26 DIAGNOSIS — F02818 Dementia in other diseases classified elsewhere, unspecified severity, with other behavioral disturbance: Secondary | ICD-10-CM

## 2016-01-26 DIAGNOSIS — R634 Abnormal weight loss: Secondary | ICD-10-CM

## 2016-01-26 DIAGNOSIS — G308 Other Alzheimer's disease: Secondary | ICD-10-CM

## 2016-01-26 DIAGNOSIS — I872 Venous insufficiency (chronic) (peripheral): Secondary | ICD-10-CM | POA: Diagnosis not present

## 2016-01-26 DIAGNOSIS — R1314 Dysphagia, pharyngoesophageal phase: Secondary | ICD-10-CM

## 2016-01-26 DIAGNOSIS — F0281 Dementia in other diseases classified elsewhere with behavioral disturbance: Secondary | ICD-10-CM

## 2016-01-26 NOTE — Progress Notes (Signed)
Patient ID: Bryan [name], male   DOB: [birth year], 82 y.o.   MRN: [id number]   Location:  [facility]  Patient Care Team: [name], DO as PCP - General (Geriatric Medicine) Well [place], NP as Nurse Practitioner (Nurse Practitioner)  Code Status:  DNR, no hospitalizations  Chief Complaint  Patient presents with  . Medical Management of Chronic Issues    HPI:  82 y.o. male  residing at [facility], memory care section. He has a hx of AD with behaviors, chronic venous insufficiency, diastolic CHF, dysphagia, afib, CKD, HLD, and OA.  He is non ambulatory due to AD and is dependent on the staff for all ADLs. 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  His weight has trended downward, currently at 215 lbs. Hx of dysphagia and is on mech soft with NTL. No recent issues with aspiration pna or choking noted.   he was treated in May for pneumonia and staff denies any fever, sob, cough, etc now.  Xray cleared in June of 2017.   Has a hx of CHF (EF 50-55%) and afib. BP and HR are acceptable per the records. Remains on eliquis for CVA risk reduction without s/e and stable H/H    Review of Systems:  Review of Systems  Unable to perform ROS: dementia      Medications: Patient's Medications  New Prescriptions   No medications on file  Previous Medications   APIXABAN (ELIQUIS) 5 MG TABS TABLET    Take 5 mg by mouth 2 (two) times daily.   CICLOPIROX (LOPROX) 0.77 % CREAM    Apply topically 2 (two) times daily as needed (redness).   DIVALPROEX (DEPAKOTE SPRINKLE) 125 MG CAPSULE    Take 125 mg by mouth at bedtime.    FUROSEMIDE (LASIX) 40 MG TABLET    Take 40 mg by mouth 2 (two) times daily. 40 mg in the am, 20 mg in the pm   IPRATROPIUM-ALBUTEROL (DUONEB) 0.5-2.5 (3) MG/3ML SOLN    Take 3 mLs by nebulization. Nebulization give every 6 hours as needed for shortness of breath or wheezing   LORATADINE (CLARITIN) 10  MG TABLET    Take 10 mg by mouth daily.   LORAZEPAM (ATIVAN) 0.5 MG TABLET    Take 1/2 tablet by mouth once daily at 6am   NITROGLYCERIN (NITROSTAT) 0.4 MG SL TABLET    Place 0.4 mg under the tongue every 5 (five) minutes as needed for chest pain.   POLYETHYLENE GLYCOL (MIRALAX / GLYCOLAX) PACKET    Take 17 g by mouth daily.   POTASSIUM CHLORIDE SA (K-DUR,KLOR-CON) 20 MEQ TABLET    Take 20 mEq by mouth daily.   PROTEIN Renee Pain) POWD    Take 1 each by mouth daily.   TRAMADOL (ULTRAM) 50 MG TABLET    Take 0.5 tablets (25 mg total) by mouth 2 (two) times daily.  Modified Medications   No medications on file  Discontinued Medications   No medications on file    Physical Exam: Filed Vitals:   01/26/16 1156  BP: 130/81  Pulse: 77  Temp: 96.2 F (35.7 C)  Resp: 20  Weight: 215 lb (97.523 kg)  SpO2: 90%    Wt Readings from Last 3 Encounters:  01/26/16 215 lb (97.523 kg)  12/14/15 224 lb (101.606 kg)  11/07/15 226 lb (102.513 kg)    Physical Exam  Constitutional: No distress.  HENT:  Head: Normocephalic and atraumatic.  Neck: No JVD present.  Cardiovascular: Normal rate.   No murmur heard. Irregular. 3+ edema BLE L>R  Pulmonary/Chest: Effort normal and breath sounds normal. No respiratory distress. He has no wheezes. He has no rales.  Abdominal: Soft. Bowel sounds are normal. He exhibits no distension. There is no tenderness.  Musculoskeletal:  Decreased ROM to both knees, hips, shoulders. Rigidity noted with ROM to all joints, also resisting exam  Neurological: He is alert.  Alert, non verbal, not able to f/c  Skin: Skin is warm and dry. He is not diaphoretic.  Psychiatric: Affect normal.     Labs/Studies reviewed:   2D echo 06/2015 Study Conclusions  - Left ventricle: The cavity size was normal. There was mild focal basal hypertrophy of the septum. Systolic function was normal. The estimated ejection fraction was in the range of 50% to 55%. Although no  diagnostic regional wall motion abnormality was identified, this possibility cannot be completely excluded on the basis of this study. - Aortic valve: Cusp separation was reduced. - Mitral valve: Calcified annulus. Mildly thickened leaflets . - Left atrium: The atrium was mildly dilated.  Basic Metabolic Panel:  Recent Labs  03/16/15 06/20/15 09/26/15  NA 143 147 143  K 5.0 4.3 4.3  BUN 28* 30* 28*  CREATININE 1.2 1.3 1.3    CBC:  Recent Labs  03/09/15 06/20/15 09/26/15  WBC 7.8 7.7 8.2  HGB 13.6 14.4 14.7  HCT 41 41 42  PLT 201 252 242    Wt Readings from Last 3 Encounters:  01/26/16 215 lb (97.523 kg)  12/14/15 224 lb (101.606 kg)  11/07/15 226 lb (102.513 kg)   Lab Results  Component Value Date   CHOL 155 08/31/2015   HDL 33* 08/31/2015   LDLCALC 99 08/31/2015   TRIG 116 08/31/2015     03/09/15: ferritin 85, TIBC 199, %sat 35, total iron 105 09/26/15: Depakote <12.5  Assessment/Plan  1) Weight loss Most likely due to advancing dementia Continue unjury qam TSH nex draw due to weight loss  2. CKD (chronic kidney disease) stage 3, GFR 30-59 ml/min Stable Continue to monitor BMP periodically and avoid nephrotoxic agents  3. Alzheimer's disease of other onset with behavioral disturbance Advancing with weight loss Continue depakote and ativan to help with agitation  4. Dysphagia, pharyngoesophageal phase Reports of occasional coughing, pna resolved from May Continue modified diet and asp prec No feeding tubes or hospitalizations  5. Chronic venous insufficiency Improved over time Continue compression and lasix  6. Chronic atrial fibrillation (HCC) Rate controlled without meds Continue eliquis for CVA risk reduction   7. Chronic diastolic congestive heart failure (HCC) Controlled Weight trending down Lasix 40 mg in am, 20 mg in  With Kdur 20 MEQ QD Would not use ACE at this point due to advancing dementia   Cindi Carbon, [place] [phone]

## 2016-01-29 DIAGNOSIS — R634 Abnormal weight loss: Secondary | ICD-10-CM | POA: Diagnosis not present

## 2016-01-29 LAB — TSH: TSH: 3.83 u[IU]/mL (ref <=5.90)

## 2016-02-01 DIAGNOSIS — I5030 Unspecified diastolic (congestive) heart failure: Secondary | ICD-10-CM | POA: Insufficient documentation

## 2016-02-14 DIAGNOSIS — N189 Chronic kidney disease, unspecified: Secondary | ICD-10-CM | POA: Diagnosis not present

## 2016-02-14 DIAGNOSIS — D638 Anemia in other chronic diseases classified elsewhere: Secondary | ICD-10-CM | POA: Diagnosis not present

## 2016-02-14 DIAGNOSIS — I5032 Chronic diastolic (congestive) heart failure: Secondary | ICD-10-CM | POA: Diagnosis not present

## 2016-02-14 DIAGNOSIS — R0602 Shortness of breath: Secondary | ICD-10-CM | POA: Diagnosis not present

## 2016-02-14 DIAGNOSIS — E870 Hyperosmolality and hypernatremia: Secondary | ICD-10-CM | POA: Diagnosis not present

## 2016-02-14 DIAGNOSIS — N183 Chronic kidney disease, stage 3 (moderate): Secondary | ICD-10-CM | POA: Diagnosis not present

## 2016-02-15 ENCOUNTER — Non-Acute Institutional Stay (SKILLED_NURSING_FACILITY): Payer: Medicare Other | Admitting: Adult Health

## 2016-02-15 ENCOUNTER — Encounter: Payer: Self-pay | Admitting: Adult Health

## 2016-02-15 DIAGNOSIS — F0391 Unspecified dementia with behavioral disturbance: Secondary | ICD-10-CM

## 2016-02-15 DIAGNOSIS — E870 Hyperosmolality and hypernatremia: Secondary | ICD-10-CM | POA: Diagnosis not present

## 2016-02-15 DIAGNOSIS — F03918 Unspecified dementia, unspecified severity, with other behavioral disturbance: Secondary | ICD-10-CM

## 2016-02-15 DIAGNOSIS — N179 Acute kidney failure, unspecified: Secondary | ICD-10-CM

## 2016-02-15 DIAGNOSIS — J189 Pneumonia, unspecified organism: Secondary | ICD-10-CM | POA: Diagnosis not present

## 2016-02-15 DIAGNOSIS — E860 Dehydration: Secondary | ICD-10-CM | POA: Diagnosis not present

## 2016-02-15 LAB — BASIC METABOLIC PANEL
BUN: 85 mg/dL — AB (ref 4–21)
Creatinine: 3.3 mg/dL — AB (ref 0.6–1.3)
Glucose: 154 mg/dL
Potassium: 4.2 mmol/L (ref 3.4–5.3)
SODIUM: 172 mmol/L — AB (ref 137–147)

## 2016-02-15 LAB — CBC AND DIFFERENTIAL
HEMATOCRIT: 54 % — AB (ref 41–53)
HEMOGLOBIN: 16.3 g/dL (ref 13.5–17.5)
PLATELETS: 269 10*3/uL (ref 150–399)
WBC: 9.7 10^3/mL

## 2016-02-15 NOTE — Progress Notes (Signed)
Location:   [facility] of Service:  SNF (31) Provider:   Cindi Carbon, ANP [place] [phone]   [name], [name], DO  Patient Care Team: [name], DO as PCP - General (Geriatric Medicine) Well [facility], NP as Nurse Practitioner (Nurse Practitioner)  Extended Emergency Contact Information Primary Emergency Contact: [name] Address: [address]          [place], [place] [address] [place] Phone: [phone] Relation: Spouse Secondary Emergency Contact: [name], [place] Phone: [phone] Relation: Daughter  Code Status:  DNR Goals of care: Advanced Directive information Advanced Directives 11/07/2015  Does patient have an advance directive? Yes  Type of Advance Directive Out of facility DNR (pink MOST or yellow form);[place];Living will  Does patient want to make changes to advanced directive? -  Copy of advanced directive(s) in chart? -  Pre-existing out of facility DNR order (yellow form or pink MOST form) Yellow form placed in chart (order not valid for inpatient use);Pink MOST form placed in chart (order not valid for inpatient use)     Chief Complaint  Patient presents with  . Acute Visit    hypernatremia    HPI:  Pt is a 82 y.o. male seen today for an acute visit for hypernatremia. He has advanced dementia and has progressive weight loss. He is non ambulatory with contractures and requires assistance for all ADL's.   He had dramatic weight loss of 21 lbs since June.  He is on thickened liquids due to dysphagia. Recent TSH WNL. Staff reports that at times he is eating and drinking and other meals he refuses. He has a most form indicating no tube feedings and he is a DNR.  A BMP was drawn on 8/9 showing a NA of 173 with CR of 3.33.  He was alert at that time with no change in mental status so it  was validated. He was give 1L of fluid but his NA on 8/10 returned at 172 with no change in Cr. I saw him this afternoon and he was lethargic with a very dry mouth, tetany, and cyanosis of his fingers.  He has a hx of diastolic CHF with fluid overload (chronically requiring Lasix 40 mg in the am and 20 mg in the pm.  EF was 50-55% in Dec of 2016.   Water deficit is 12L.  Anion gap is 20.   Past Medical History:  Diagnosis Date  . Allergic rhinitis   . Alzheimer's disease 2013  . Atrial fibrillation ([place])    Long term anti coagulation w/ warfarin fro stroke risk reduction  . BPH (benign prostatic hyperplasia)   . Chronic venous insufficiency 01/12/2013  . Dementia with behavioral disturbance 12/29/13  . Gait disorder   . GERD (gastroesophageal reflux disease)   . History of CVA (cerebrovascular accident) 2001   Rt.MCA  . Hyperlipidemia   . Lactose intolerance   . Long term (current) use of anticoagulants 11/23/2012   Long-term anticoagulation with warfarin for stroke risk reduction related to AF   Past Surgical History:  Procedure Laterality Date  . CATARACT EXTRACTION W/ INTRAOCULAR LENS  IMPLANT, BILATERAL    . HERNIA REPAIR Right 1970s  . KNEE SURGERY Right 2000  . MASS EXCISION Left 07/30/2013   Procedure: EXCISION OF BASAL CELL CARCINOMA  LEFT FOREHEAD ;  Surgeon:  Irene Limbo, MD;  Location: [facility];  Service: Plastics;  Laterality: Left;  . RETINAL DETACHMENT SURGERY Right 2009    Allergies  Allergen Reactions  . Penicillins Rash  . Bee Venom   . Risperidone And Related     Unstable gait      Medication List       Accurate as of 02/15/16  8:21 PM. Always use your most recent med list.          ciclopirox 0.77 % cream Commonly known as:  LOPROX Apply topically 2 (two) times daily as needed (redness).   divalproex 125 MG capsule Commonly known as:  DEPAKOTE SPRINKLE Take 125 mg by mouth at bedtime.   ELIQUIS 5 MG Tabs tablet Generic drug:   apixaban Take 5 mg by mouth 2 (two) times daily.   furosemide 40 MG tablet Commonly known as:  LASIX Take 40 mg by mouth 2 (two) times daily. 40 mg in the am, 20 mg in the pm   ipratropium-albuterol 0.5-2.5 (3) MG/3ML Soln Commonly known as:  DUONEB Take 3 mLs by nebulization. Nebulization give every 6 hours as needed for shortness of breath or wheezing   loratadine 10 MG tablet Commonly known as:  CLARITIN Take 10 mg by mouth daily.   LORazepam 0.5 MG tablet Commonly known as:  ATIVAN Take 1/2 tablet by mouth once daily at 6am   nitroGLYCERIN 0.4 MG SL tablet Commonly known as:  NITROSTAT Place 0.4 mg under the tongue every 5 (five) minutes as needed for chest pain.   polyethylene glycol packet Commonly known as:  MIRALAX / GLYCOLAX Take 17 g by mouth daily.   potassium chloride SA 20 MEQ tablet Commonly known as:  K-DUR,KLOR-CON Take 20 mEq by mouth daily.   traMADol 50 MG tablet Commonly known as:  ULTRAM Take 0.5 tablets (25 mg total) by mouth 2 (two) times daily.   UNJURY Powd Take 1 each by mouth daily.       Review of Systems  Unable to perform ROS: Dementia    Immunization History  Administered Date(s) Administered  . Influenza Whole 04/20/2013  . Influenza-Unspecified 04/12/2014, 04/20/2015  . PPD Test 11/29/2012   Pertinent  Health Maintenance Due  Topic Date Due  . PNA vac Low Risk Adult (1 of 2 - PCV13) 06/08/1990  . INFLUENZA VACCINE  02/06/2016   No flowsheet data found. Functional Status Survey:    Vitals:   02/15/16 2017  BP: 108/84  Pulse: 92  Resp: 20  Temp: (!) 96.6 F (35.9 C)  SpO2: 92%  Weight: 203 lb 1.6 oz (92.1 kg)   Body mass index is 27.55 kg/m.  Wt Readings from Last 3 Encounters:  02/15/16 203 lb 1.6 oz (92.1 kg)  01/26/16 215 lb (97.5 kg)  12/14/15 224 lb (101.6 kg)   Physical Exam  Constitutional: No distress.  HENT:  Head: Normocephalic and atraumatic.  Right Ear: External ear normal.  Left Ear: External  ear normal.  Nose: Nose normal.  Very dry mucus membranes  Eyes: Conjunctivae are normal. Pupils are equal, round, and reactive to light. Right eye exhibits no discharge. Left eye exhibits no discharge.  Pinpoint pupils  Neck: Normal range of motion. Neck supple. No JVD present. No tracheal deviation present. No thyromegaly present.  Cardiovascular:  Irregular, BLE edema trace  Pulmonary/Chest: Effort normal. No respiratory distress.  Period of apnea during assessment  Abdominal: Soft. Bowel sounds are normal. There is no tenderness.  Lymphadenopathy:  He has no cervical adenopathy.  Neurological: He is alert.  Not able to f/c,  Tetany noted on exam. No obvious focal deficit. Has contracted limbs and resisted during reflex exam  Skin: Skin is warm and dry. He is not diaphoretic.    Labs reviewed:  Recent Labs  03/16/15 06/20/15 09/26/15  NA 143 147 143  K 5.0 4.3 4.3  BUN 28* 30* 28*  CREATININE 1.2 1.3 1.3    Recent Labs  06/20/15 08/31/15  AST 14 15  ALT 13 12  ALKPHOS 88 99    Recent Labs  03/09/15 06/20/15 09/26/15  WBC 7.8 7.7 8.2  HGB 13.6 14.4 14.7  HCT 41 41 42  PLT 201 252 242   Lab Results  Component Value Date   TSH 4.44 08/08/2015   No results found for: HGBA1C Lab Results  Component Value Date   CHOL 155 08/31/2015   HDL 33 (A) 08/31/2015   LDLCALC 99 08/31/2015   TRIG 116 08/31/2015  2 D echo 06/22/14 Study Conclusions  - Left ventricle: The cavity size was normal. There was mild focal basal hypertrophy of the septum. Systolic function was normal. The estimated ejection fraction was in the range of 50% to 55%. Although no diagnostic regional wall motion abnormality was identified, this possibility cannot be completely excluded on the basis of this study. - Aortic valve: Cusp separation was reduced. - Mitral valve: Calcified annulus. Mildly thickened leaflets . - Left atrium: The atrium was mildly dilated.  Significant  Diagnostic Results in last 30 days:  No results found.  Assessment/Plan  1. Hypernatremia -No improvement at this point. The underlying issue is poor intake due to dementia, while on chronic lasix due to chronic edematous state.   -His family does not wish to send him to the hospital but would like treatment here at the facility. I discussed his overall poor condition and high risk of mortality with his wife and son. He is showing signs of neurologic compromise due to his electrolyte imbalance.  We will give him DS1/4 NS at 200/hr for one bag, then at 100 cc/hr.  Recheck labs in the am.  Check CXR as well to rule out pna as he has had dehydration from pna before in a similar setting.  2. Acute renal failure, unspecified acute renal failure type ([place]) -due to volume depletion with metabolic acidosis with anion gap of 20 -replenish fluid, hold lasix and eliquis (due to renal failure) -place foley with strict I and Os and VS monitoring  3. Dementia with behavioral disturbance End stage, would recommend hospice in this setting but his family is not ready for this at this stage. I let wife know that sustaining his life with IVF would not be appropriate. She would like [place] to continue to do so until all of her family can arrive and be with Mr. [facility].    Family/ staff Communication: discussed with his wife and son  Labs/tests ordered:  CBC, BMP CXR

## 2016-02-16 ENCOUNTER — Encounter: Payer: Self-pay | Admitting: Adult Health

## 2016-02-16 ENCOUNTER — Non-Acute Institutional Stay (SKILLED_NURSING_FACILITY): Payer: Medicare Other | Admitting: Adult Health

## 2016-02-16 DIAGNOSIS — L8990 Pressure ulcer of unspecified site, unspecified stage: Secondary | ICD-10-CM | POA: Diagnosis not present

## 2016-02-16 DIAGNOSIS — N401 Enlarged prostate with lower urinary tract symptoms: Secondary | ICD-10-CM

## 2016-02-16 DIAGNOSIS — N403 Nodular prostate with lower urinary tract symptoms: Secondary | ICD-10-CM

## 2016-02-16 DIAGNOSIS — J189 Pneumonia, unspecified organism: Secondary | ICD-10-CM | POA: Diagnosis not present

## 2016-02-16 DIAGNOSIS — N179 Acute kidney failure, unspecified: Secondary | ICD-10-CM | POA: Diagnosis not present

## 2016-02-16 DIAGNOSIS — E870 Hyperosmolality and hypernatremia: Secondary | ICD-10-CM

## 2016-02-16 DIAGNOSIS — R338 Other retention of urine: Secondary | ICD-10-CM

## 2016-02-16 NOTE — Progress Notes (Signed)
Of note, Bryan [name] has had end stage dementia for at least the past year.  He has been living in memory care.  He is verbal but unintelligible the majority of the time.  He has become agitated and combative with care and staff and providers have been trying to avoid overly aggressive treatments and investigations for him b/c of his behaviors.  When pt's initial labs returned 02/14/16 around 6pm, he was eating his dinner in the dining room and alert.  Orders were given to immediately stat recheck the bmp and cbc which we felt could not possibly be correct considering his mentation and normal vital signs.  He had evidently lost 17 lbs in the past month which had been reported to the dietitian and she had modified his supplements.  He'd been eating 25-75% of most meals per documentation (unclear how large these meals were) and was drinking 200cc.    Fluids will need to be run continuously and labs drawn daily to monitor Na and BUN/cr and ensure we are not correcting too quickly.  Prognosis is poor and has been long term.  I recommend hospice care for him and discontinuing IVFs so pt can be comfortable and at peace especially if he becomes agitated or attempts to remove his IVs/resists care.

## 2016-02-16 NOTE — Progress Notes (Signed)
Location:   Customer service manager of Service:  SNF (31) Provider:   Cindi Carbon, ANP [place] [phone]   [name], [name], DO  Patient Care Team: [name], DO as PCP - General (Geriatric Medicine) Well [place], NP as Nurse Practitioner (Nurse Practitioner)  Extended Emergency Contact Information Primary Emergency Contact: [name] Address: [place] [place]          [place], [place] [address] [place] Phone: [phone] Relation: Spouse Secondary Emergency Contact: [name], [place] Phone: [phone] Relation: Daughter  Code Status:  DNR Goals of care: Advanced Directive information Advanced Directives 02/16/2016  Does patient have an advance directive? Yes  Type of Advance Directive [place]  Does patient want to make changes to advanced directive? -  Copy of advanced directive(s) in chart? Yes  Pre-existing out of facility DNR order (yellow form or pink MOST form) -     Chief Complaint  Patient presents with  . Acute Visit    f/u hypernatremia    HPI:  Pt is a 82 y.o. male seen on 8/10 for an acute visit for hypernatremia. He has advanced dementia and has progressive weight loss. He is non ambulatory with contractures and requires assistance for all ADL's.   He had dramatic weight loss of 21 lbs since June.  He is on thickened liquids due to dysphagia. Recent TSH WNL. Staff reports that at times he is eating and drinking and other meals he refuses. He has a most form indicating no tube feedings and he is a DNR.  A BMP was drawn on 8/9 showing a NA of 173 with CR of 3.33.  He was alert at that time with no change in mental status so it was validated. He was give 1L of fluid but his NA on 8/10 returned at 172 with no change in Cr. I saw him this afternoon and he was lethargic with a very dry mouth, tetany, and  cyanosis of his fingers.  He has a hx of diastolic CHF with fluid overload (chronically requiring Lasix 40 mg in the am and 20 mg in the pm.  EF was 50-55% in Dec of 2016.   Water deficit is 12L.  Anion gap is 20.  Update 02/16/16: Has received 3L total of IVF.  Appears more alert but still not able to take in oral liquids. No further twitching episodes.  Foley placed yesterday with difficulty, 1400 cc of urine out that is blood tinged.  CXR returned showing patchy interstitial changes medial left lower lung consistent with pna.  His private caregiver, Denny Peon, reports that he has forgotten how to swallow and does seem to know what to do when a spoon is place at his mouth over the past two weeks. No cough, wheezing or shortness breath reported.  Past Medical History:  Diagnosis Date  . Allergic rhinitis   . Alzheimer's disease 2013  . Atrial fibrillation ([place])    Long term anti coagulation w/ warfarin fro stroke risk reduction  . BPH (benign prostatic hyperplasia)   . Chronic venous insufficiency 01/12/2013  . Dementia with behavioral disturbance 12/29/13  . Gait disorder   . GERD (gastroesophageal reflux disease)   . History of CVA (cerebrovascular accident) 2001   Rt.MCA  . Hyperlipidemia   . Lactose intolerance   . Long term (current) use of anticoagulants  11/23/2012   Long-term anticoagulation with warfarin for stroke risk reduction related to AF   Past Surgical History:  Procedure Laterality Date  . CATARACT EXTRACTION W/ INTRAOCULAR LENS  IMPLANT, BILATERAL    . HERNIA REPAIR Right 1970s  . KNEE SURGERY Right 2000  . MASS EXCISION Left 07/30/2013   Procedure: EXCISION OF BASAL CELL CARCINOMA  LEFT FOREHEAD ;  Surgeon: Irene Limbo, MD;  Location: [facility];  Service: Plastics;  Laterality: Left;  . RETINAL DETACHMENT SURGERY Right 2009    Allergies  Allergen Reactions  . Penicillins Rash  . Bee Venom   . Risperidone And Related     Unstable gait        Medication List       Accurate as of 02/16/16  9:46 AM. Always use your most recent med list.          ciclopirox 0.77 % cream Commonly known as:  LOPROX Apply topically 2 (two) times daily as needed (redness).   divalproex 125 MG capsule Commonly known as:  DEPAKOTE SPRINKLE Take 125 mg by mouth at bedtime.   ELIQUIS 5 MG Tabs tablet Generic drug:  apixaban Take 5 mg by mouth 2 (two) times daily.   furosemide 40 MG tablet Commonly known as:  LASIX Take 40 mg by mouth 2 (two) times daily. 40 mg in the am, 20 mg in the pm   ipratropium-albuterol 0.5-2.5 (3) MG/3ML Soln Commonly known as:  DUONEB Take 3 mLs by nebulization. Nebulization give every 6 hours as needed for shortness of breath or wheezing   loratadine 10 MG tablet Commonly known as:  CLARITIN Take 10 mg by mouth daily.   LORazepam 0.5 MG tablet Commonly known as:  ATIVAN Take 1/2 tablet by mouth once daily at 6am   nitroGLYCERIN 0.4 MG SL tablet Commonly known as:  NITROSTAT Place 0.4 mg under the tongue every 5 (five) minutes as needed for chest pain.   polyethylene glycol packet Commonly known as:  MIRALAX / GLYCOLAX Take 17 g by mouth daily.   potassium chloride SA 20 MEQ tablet Commonly known as:  K-DUR,KLOR-CON Take 20 mEq by mouth daily.   traMADol 50 MG tablet Commonly known as:  ULTRAM Take 0.5 tablets (25 mg total) by mouth 2 (two) times daily.   UNJURY Powd Take 1 each by mouth daily.       Review of Systems  Unable to perform ROS: Dementia    Immunization History  Administered Date(s) Administered  . Influenza Whole 04/20/2013  . Influenza-Unspecified 04/12/2014, 04/20/2015  . PPD Test 11/29/2012   Pertinent  Health Maintenance Due  Topic Date Due  . PNA vac Low Risk Adult (1 of 2 - PCV13) 06/08/1990  . INFLUENZA VACCINE  02/06/2016   No flowsheet data found. Functional Status Survey:    Vitals:   02/16/16 0934  BP: 118/82  Pulse: 82  Resp: 20  Temp: (!) 96.7 F  (35.9 C)  SpO2: 90%  Weight: 203 lb (92.1 kg)   Body mass index is 27.53 kg/m.  Wt Readings from Last 3 Encounters:  02/16/16 203 lb (92.1 kg)  02/15/16 203 lb 1.6 oz (92.1 kg)  01/26/16 215 lb (97.5 kg)   Physical Exam  Constitutional: No distress.  HENT:  Head: Normocephalic and atraumatic.  Nose: Nose normal.  Improved moisture to mucous membranes  Eyes: Conjunctivae are normal. Pupils are equal, round, and reactive to light. Right eye exhibits no discharge. Left eye exhibits no discharge.  Pinpoint pupils  Neck: Normal range of motion. Neck supple. No JVD present. No tracheal deviation present. No thyromegaly present.  Cardiovascular:  Irregular, BLE edema +1  Pulmonary/Chest: Effort normal and breath sounds normal. No respiratory distress.  Abdominal: Soft. Bowel sounds are normal. There is no tenderness.  Genitourinary:  Genitourinary Comments: Hypospadias noted, enlarged prostate with hard nodule noted, not tender or boggy   Lymphadenopathy:    He has no cervical adenopathy.  Neurological: He is alert.  Not able to f/c  . No obvious focal deficit.   Skin: Skin is warm and dry. He is not diaphoretic.  Erythema to bilateral buttocks  Psychiatric:  Improved alertness    Labs reviewed:  Recent Labs  03/16/15 06/20/15 09/26/15  NA 143 147 143  K 5.0 4.3 4.3  BUN 28* 30* 28*  CREATININE 1.2 1.3 1.3    Recent Labs  06/20/15 08/31/15  AST 14 15  ALT 13 12  ALKPHOS 88 99    Recent Labs  03/09/15 06/20/15 09/26/15  WBC 7.8 7.7 8.2  HGB 13.6 14.4 14.7  HCT 41 41 42  PLT 201 252 242   Lab Results  Component Value Date   TSH 4.44 08/08/2015   No results found for: HGBA1C Lab Results  Component Value Date   CHOL 155 08/31/2015   HDL 33 (A) 08/31/2015   LDLCALC 99 08/31/2015   TRIG 116 08/31/2015  2 D echo 06/22/14 Study Conclusions  - Left ventricle: The cavity size was normal. There was mild focal basal hypertrophy of the septum. Systolic  function was normal. The estimated ejection fraction was in the range of 50% to 55%. Although no diagnostic regional wall motion abnormality was identified, this possibility cannot be completely excluded on the basis of this study. - Aortic valve: Cusp separation was reduced. - Mitral valve: Calcified annulus. Mildly thickened leaflets . - Left atrium: The atrium was mildly dilated.  Significant Diagnostic Results in last 30 days:  No results found.  Assessment/Plan  1. Hypernatremia -Awaiting labs but significant improvement in mental status and mucous membranes on exam -continue D5 1/4 NS at 100 cc/hr -monitor resp status and weights -BMP daily, add Mg  2. Acute renal failure, unspecified acute renal failure type ([place]) -due to volume contraction with metabolic acidosis with anion gap of 20 -replenish fluid, hold lasix and eliquis (due to renal failure) -place foley with strict I and Os and VS monitoring -labs pending  3. HCAP -Duoneb q 6 hrs prn sob or wheeze or cough -Levaquin 500 mg x 1 dose then 250 mg q 48 hrs for 6 more doses -Florastor 1 cap BID for 10 days -staff to report if he is not able to swallow, he was able to swallow tylenol this am  4. Pressure injury stage 1 -air mattress -endit cream with brief changes  5. Urinary retention due to enlarged/nodular prostate -had nodule noted on prostate which is enlarged -maintain foley and monitor I and O's -no further work up on this issue due to age/debility   Family/ staff Communication: discussed with his daughter Butch Penny, and his wife Izora Gala  Labs/tests ordered:  BMP daily, Mg

## 2016-02-17 DIAGNOSIS — G301 Alzheimer's disease with late onset: Secondary | ICD-10-CM | POA: Diagnosis not present

## 2016-02-17 DIAGNOSIS — R1312 Dysphagia, oropharyngeal phase: Secondary | ICD-10-CM | POA: Diagnosis not present

## 2016-02-17 DIAGNOSIS — E860 Dehydration: Secondary | ICD-10-CM | POA: Diagnosis not present

## 2016-02-18 DIAGNOSIS — R062 Wheezing: Secondary | ICD-10-CM | POA: Diagnosis not present

## 2016-02-19 ENCOUNTER — Non-Acute Institutional Stay (SKILLED_NURSING_FACILITY): Payer: Medicare Other | Admitting: Adult Health

## 2016-02-19 DIAGNOSIS — J189 Pneumonia, unspecified organism: Secondary | ICD-10-CM

## 2016-02-19 DIAGNOSIS — N179 Acute kidney failure, unspecified: Secondary | ICD-10-CM

## 2016-02-19 DIAGNOSIS — R627 Adult failure to thrive: Secondary | ICD-10-CM

## 2016-02-19 DIAGNOSIS — G301 Alzheimer's disease with late onset: Secondary | ICD-10-CM | POA: Diagnosis not present

## 2016-02-19 DIAGNOSIS — R1312 Dysphagia, oropharyngeal phase: Secondary | ICD-10-CM | POA: Diagnosis not present

## 2016-02-19 DIAGNOSIS — E870 Hyperosmolality and hypernatremia: Secondary | ICD-10-CM | POA: Diagnosis not present

## 2016-02-19 DIAGNOSIS — D638 Anemia in other chronic diseases classified elsewhere: Secondary | ICD-10-CM | POA: Diagnosis not present

## 2016-02-19 LAB — BASIC METABOLIC PANEL
BUN: 32 mg/dL — AB (ref 4–21)
Creatinine: 1.7 mg/dL — AB (ref 0.6–1.3)
GLUCOSE: 109 mg/dL
Potassium: 3.8 mmol/L (ref 3.4–5.3)
SODIUM: 143 mmol/L (ref 137–147)

## 2016-02-19 NOTE — Progress Notes (Signed)
Location:   Customer service manager of Service:  SNF (31) Provider:   Cindi Carbon, ANP [place] [phone]   [name], [name], DO  Patient Care Team: [name], DO as PCP - General (Geriatric Medicine) Well [place], NP as Nurse Practitioner (Nurse Practitioner)  Extended Emergency Contact Information Primary Emergency Contact: [name] Address: [address]          [place], [place] [address] [place] Phone: [phone] Relation: Spouse Secondary Emergency Contact: [name], [place] Phone: [phone] Relation: Daughter  Code Status:  DNR Goals of care: Advanced Directive information Advanced Directives 02/16/2016  Does patient have an advance directive? Yes  Type of Advance Directive [place]  Does patient want to make changes to advanced directive? -  Copy of advanced directive(s) in chart? Yes  Pre-existing out of facility DNR order (yellow form or pink MOST form) -     Chief Complaint  Patient presents with  . Acute Visit    f/u pna, hypernatremia    HPI:  Pt is a 82 y.o. male seen on 8/10 for an acute visit for hypernatremia and pna.  Currently receiving Levquin 250 mg QOD (dosed based on Cr Cl) for LLL pna. F/U Xray on 02/18/16 showed no CHF, only reactive airway disease and peribronchial thickening.  Staff reports occasional cough and sputum production. Mild increased WOB, 02 dependent at 3L. Sats in the 90's.   Also treated for hypernatremia sodium of 173 with D5 14 NS and D5W. A slow correction was made in the NA and as of 8/14 NA is 143.  He also went into acute renal failure but with IVF his BUN/CR has reduced to 32/1.67.   He has end stage dementia and is chair/bed bound, dependent for all ADL's with his caregivers.  Over the past few weeks he has been eating less and losing weight. He has dysphagia  and is on a puree diet with NTL and is working with [place].  His son is a physician and has been very involved in his care. He would like to change his diet to 5 small meals a day with calorie counts daily and frequent weight monitoring.   He did not sleep well last night due to some agitation and has not gotten up this am to eat.  His urine output for night shift was 600cc, an improvement. He is beginning to eat small amts but not adequate at this point.    Past Medical History:  Diagnosis Date  . Allergic rhinitis   . Alzheimer's disease 2013  . Atrial fibrillation ([facility])    Long term anti coagulation w/ warfarin fro stroke risk reduction  . BPH (benign prostatic hyperplasia)   . Chronic venous insufficiency 01/12/2013  . Dementia with behavioral disturbance 12/29/13  . Gait disorder   . GERD (gastroesophageal reflux disease)   . History of CVA (cerebrovascular accident) 2001   Rt.MCA  . Hyperlipidemia   . Lactose intolerance   . Long term (current) use of anticoagulants 11/23/2012   Long-term anticoagulation with warfarin for stroke risk reduction related to AF   Past Surgical History:  Procedure Laterality Date  . CATARACT EXTRACTION W/ INTRAOCULAR LENS  IMPLANT, BILATERAL    . HERNIA REPAIR Right 1970s  . KNEE SURGERY Right 2000  . MASS EXCISION Left 07/30/2013   Procedure: EXCISION  OF BASAL CELL CARCINOMA  LEFT FOREHEAD ;  Surgeon: Irene Limbo, MD;  Location: [facility];  Service: Plastics;  Laterality: Left;  . RETINAL DETACHMENT SURGERY Right 2009    Allergies  Allergen Reactions  . Penicillins Rash  . Bee Venom   . Risperidone And Related     Unstable gait      Medication List       Accurate as of 02/19/16 10:37 AM. Always use your most recent med list.          ciclopirox 0.77 % cream Commonly known as:  LOPROX Apply topically 2 (two) times daily as needed (redness).   ipratropium-albuterol 0.5-2.5 (3) MG/3ML Soln Commonly known as:   DUONEB Take 3 mLs by nebulization 3 (three) times daily. Nebulization give every 6 hours as needed for shortness of breath or wheezing   nitroGLYCERIN 0.4 MG SL tablet Commonly known as:  NITROSTAT Place 0.4 mg under the tongue every 5 (five) minutes as needed for chest pain.   NUTRITIONAL SUPPLEMENT PO Take 1 Container by mouth daily.   polyethylene glycol packet Commonly known as:  MIRALAX / GLYCOLAX Take 17 g by mouth daily.   UNJURY Powd Take 1 each by mouth 2 (two) times daily.       Review of Systems  Unable to perform ROS: Dementia    Immunization History  Administered Date(s) Administered  . Influenza Whole 04/20/2013  . Influenza-Unspecified 04/12/2014, 04/20/2015  . PPD Test 11/29/2012   Pertinent  Health Maintenance Due  Topic Date Due  . PNA vac Low Risk Adult (1 of 2 - PCV13) 06/08/1990  . INFLUENZA VACCINE  02/06/2016   No flowsheet data found. Functional Status Survey:    Vitals:   02/19/16 1024  BP: (!) 136/51  Pulse: 61  Resp: (!) 22  Temp: 99.1 F (37.3 C)  SpO2: 98%   There is no height or weight on file to calculate BMI.  Wt Readings from Last 3 Encounters:  02/16/16 203 lb (92.1 kg)  02/15/16 203 lb 1.6 oz (92.1 kg)  01/26/16 215 lb (97.5 kg)   Physical Exam  Constitutional: No distress.  HENT:  Head: Normocephalic and atraumatic.  Nose: Nose normal.  Improved moisture to mucous membranes  Eyes: Conjunctivae are normal. Pupils are equal, round, and reactive to light. Right eye exhibits no discharge. Left eye exhibits no discharge.  Pinpoint pupils  Neck: Normal range of motion. Neck supple. No JVD present. No tracheal deviation present. No thyromegaly present.  Cardiovascular:  Irregular, BLE edema +1  Pulmonary/Chest: Effort normal. He has wheezes (upper airway wheezing, mild increased wob).  Abdominal: Soft. Bowel sounds are normal. There is no tenderness.  Lymphadenopathy:    He has no cervical adenopathy.  Neurological: He  is alert.  Not able to f/c  . No obvious focal deficit.   Skin: Skin is warm and dry. He is not diaphoretic.  Psychiatric:  Sleepy today but does arouse to verbal stimulus    Labs reviewed:  Recent Labs  09/26/15 02/15/16 02/19/16  NA 143 172* 143  K 4.3 4.2 3.8  BUN 28* 85* 32*  CREATININE 1.3 3.3* 1.7*    Recent Labs  06/20/15 08/31/15  AST 14 15  ALT 13 12  ALKPHOS 88 99    Recent Labs  06/20/15 09/26/15 02/15/16  WBC 7.7 8.2 9.7  HGB 14.4 14.7 16.3  HCT 41 42 54*  PLT 252 242 269   Lab Results  Component Value  Date   TSH 3.83 01/29/2016   No results found for: HGBA1C Lab Results  Component Value Date   CHOL 155 08/31/2015   HDL 33 (A) 08/31/2015   LDLCALC 99 08/31/2015   TRIG 116 08/31/2015  2 D echo 06/22/14 Study Conclusions  - Left ventricle: The cavity size was normal. There was mild focal basal hypertrophy of the septum. Systolic function was normal. The estimated ejection fraction was in the range of 50% to 55%. Although no diagnostic regional wall motion abnormality was identified, this possibility cannot be completely excluded on the basis of this study. - Aortic valve: Cusp separation was reduced. - Mitral valve: Calcified annulus. Mildly thickened leaflets . - Left atrium: The atrium was mildly dilated.  Significant Diagnostic Results in last 30 days:  No results found.  Assessment/Plan  1) Hypernatremia Resolving Decrease D5 1/2 NS at 75 cc hr as a maintenance fluid at his son's request but I let him know that we would like to d/c it in the near future once we coordinate with the nutritionist at his request for a plan to prevent dehydration in the future. He has advanced dementia and has been losing weight for quite some time. His goals of care are comfort based with no tube feeding or hospitalizations.  His son wants to give him every possible change to overcome this issue as well as pna. BMP in am  2) LLL PNA -has some upper  airway wheezing and sputum production but no acute distress -maintained on oxygen at 2-3L -continue duonebs TID and prn -change levaquin to 250 mg qd to complete 7 day course now that his kidney function has improved  3) ARF -improving see above -off lasix and eliquis at this time, will need to re address this in the future  4) FTT -nutritional consult -family requesting 5 small meals a day with increase caregiver over sight -continue to monitor weights and provide unjury and magic cup   Cindi Carbon, [place] [phone]

## 2016-02-20 ENCOUNTER — Other Ambulatory Visit: Payer: Self-pay

## 2016-02-20 ENCOUNTER — Encounter (HOSPITAL_COMMUNITY): Payer: Self-pay

## 2016-02-20 ENCOUNTER — Emergency Department (HOSPITAL_COMMUNITY)
Admission: EM | Admit: 2016-02-20 | Discharge: 2016-02-21 | Disposition: A | Discharge status: Home / Self Care | Payer: Medicare Other | Attending: Emergency Medicine | Admitting: Emergency Medicine

## 2016-02-20 ENCOUNTER — Emergency Department (HOSPITAL_COMMUNITY): Payer: Medicare Other

## 2016-02-20 DIAGNOSIS — Z8673 Personal history of transient ischemic attack (TIA), and cerebral infarction without residual deficits: Secondary | ICD-10-CM | POA: Insufficient documentation

## 2016-02-20 DIAGNOSIS — G309 Alzheimer's disease, unspecified: Secondary | ICD-10-CM | POA: Diagnosis not present

## 2016-02-20 DIAGNOSIS — N183 Chronic kidney disease, stage 3 (moderate): Secondary | ICD-10-CM | POA: Diagnosis not present

## 2016-02-20 DIAGNOSIS — I5030 Unspecified diastolic (congestive) heart failure: Secondary | ICD-10-CM | POA: Diagnosis not present

## 2016-02-20 DIAGNOSIS — R0602 Shortness of breath: Secondary | ICD-10-CM | POA: Insufficient documentation

## 2016-02-20 DIAGNOSIS — D638 Anemia in other chronic diseases classified elsewhere: Secondary | ICD-10-CM | POA: Diagnosis not present

## 2016-02-20 DIAGNOSIS — Z7901 Long term (current) use of anticoagulants: Secondary | ICD-10-CM | POA: Insufficient documentation

## 2016-02-20 DIAGNOSIS — Z87891 Personal history of nicotine dependence: Secondary | ICD-10-CM | POA: Diagnosis not present

## 2016-02-20 DIAGNOSIS — J984 Other disorders of lung: Secondary | ICD-10-CM | POA: Diagnosis not present

## 2016-02-20 NOTE — ED Notes (Signed)
DNR and MOST form at bedside

## 2016-02-20 NOTE — ED Provider Notes (Signed)
[facility] DEPT Provider Note   CSN: [id number] Arrival date & time: 02/20/16  2300     History   Chief Complaint Chief Complaint  Patient presents with  . Shortness of Breath    HPI Bryan [name] is a 82 y.o. male.  Patient with history of advanced dementia at end-of-life, currently at [facility] after being diagnosed with hypernatremia -- presents with respiratory distress starting today. Patient has been receiving fluids. Noted to be tachypneic with deep respirations. Sent to emergency department for evaluation. Patient has a most form and is DO NOT RESUSCITATE. Family reiterates that he does not want any heroic measures performed. Currently on Levaquin for possible left lower lobe pneumonia. Level V caveat due to dementia.      Past Medical History:  Diagnosis Date  . Allergic rhinitis   . Alzheimer's disease 2013  . Atrial fibrillation ([place])    Long term anti coagulation w/ warfarin fro stroke risk reduction  . BPH (benign prostatic hyperplasia)   . Chronic venous insufficiency 01/12/2013  . Dementia with behavioral disturbance 12/29/13  . Gait disorder   . GERD (gastroesophageal reflux disease)   . History of CVA (cerebrovascular accident) 2001   Rt.MCA  . Hyperlipidemia   . Lactose intolerance   . Long term (current) use of anticoagulants 11/23/2012   Long-term anticoagulation with warfarin for stroke risk reduction related to AF    Patient Active Problem List   Diagnosis Date Noted  . Diastolic CHF ([place]) 123456  . Rigidity 06/23/2015  . Osteoarthritis of right knee 05/19/2015  . Dysphagia, pharyngoesophageal phase 09/05/2014  . Protein-calorie malnutrition ([place]) 08/09/2014  . Hyperlipidemia 06/03/2014  . CKD (chronic kidney disease) stage 3, GFR 30-59 ml/min 06/03/2014  . Dementia with behavioral disturbance 12/29/2013  . Skin lesion of face 03/30/2013  . Osteoarthritis of spine 03/10/2013  . Chronic venous insufficiency 01/12/2013  . Edema of  both legs 01/12/2013  . Long term current use of anticoagulant therapy 11/23/2012  . Alzheimer's disease   . Atrial fibrillation ([place])   . BPH (benign prostatic hyperplasia)   . Gait disorder     Past Surgical History:  Procedure Laterality Date  . CATARACT EXTRACTION W/ INTRAOCULAR LENS  IMPLANT, BILATERAL    . HERNIA REPAIR Right 1970s  . KNEE SURGERY Right 2000  . MASS EXCISION Left 07/30/2013   Procedure: EXCISION OF BASAL CELL CARCINOMA  LEFT FOREHEAD ;  Surgeon: Irene Limbo, MD;  Location: [facility];  Service: Plastics;  Laterality: Left;  . RETINAL DETACHMENT SURGERY Right 2009       Home Medications    Prior to Admission medications   Medication Sig Start Date End Date Taking? Authorizing Provider  ciclopirox (LOPROX) 0.77 % cream Apply topically 2 (two) times daily as needed (redness).    Historical Provider, MD  ipratropium-albuterol (DUONEB) 0.5-2.5 (3) MG/3ML SOLN Take 3 mLs by nebulization 3 (three) times daily. Nebulization give every 6 hours as needed for shortness of breath or wheezing     Historical Provider, MD  nitroGLYCERIN (NITROSTAT) 0.4 MG SL tablet Place 0.4 mg under the tongue every 5 (five) minutes as needed for chest pain.    Historical Provider, MD  Nutritional Supplements (NUTRITIONAL SUPPLEMENT PO) Take 1 Container by mouth daily.    Historical Provider, MD  polyethylene glycol (MIRALAX / GLYCOLAX) packet Take 17 g by mouth daily.    Historical Provider, MD  Protein Renee Pain) POWD Take 1 each by mouth 2 (two) times daily.  Historical Provider, MD    Family History No family history on file.  Social History Social History  Substance Use Topics  . Smoking status: Former Smoker    Quit date: 07/24/1983  . Smokeless tobacco: Former Systems developer    Quit date: 11/18/1970  . Alcohol use Yes     Comment: occasionally     Allergies   Penicillins; Bee venom; and Risperidone and related   Review of Systems Review of Systems  Unable  to perform ROS: Dementia     Physical Exam Updated Vital Signs BP (!) 150/110 (BP Location: Right Arm)   Pulse 107   Resp (!) 28   SpO2 100%   Physical Exam  Constitutional: He appears well-developed and well-nourished.  HENT:  Head: Normocephalic and atraumatic.  Eyes: Conjunctivae are normal.  Neck: Normal range of motion. Neck supple. No JVD present.  Cardiovascular: Normal rate.   Pulmonary/Chest: Tachypnea noted. He has wheezes. He has no rales.  Musculoskeletal: He exhibits edema (2+ pedal edema to ankles).  Neurological: He is alert.  Skin: Skin is warm and dry.  Psychiatric: He has a normal mood and affect.  Nursing note and vitals reviewed.    ED Treatments / Results  Labs (all labs ordered are listed, but only abnormal results are displayed) Labs Reviewed  BASIC METABOLIC PANEL - Abnormal; Notable for the following:       Result Value   Potassium 3.4 (*)    Chloride 112 (*)    Glucose, Bld 115 (*)    BUN 22 (*)    Creatinine, Ser 1.51 (*)    Calcium 7.8 (*)    GFR calc non Af Amer 39 (*)    GFR calc Af Amer 45 (*)    All other components within normal limits  CBC WITH DIFFERENTIAL/PLATELET    ED ECG REPORT   Date: 02/21/2016  Rate: 99  Rhythm: atrial fibrillation  QRS Axis: left  Intervals: normal  ST/T Wave abnormalities: normal  Conduction Disutrbances:left bundle branch block  Narrative Interpretation:   Old EKG Reviewed: unchanged  I have personally reviewed the EKG tracing and agree with the computerized printout as noted.    Radiology Dg Chest Port 1 View  Result Date: 02/20/2016 CLINICAL DATA:  Shortness breath.  Difficulty breathing. EXAM: PORTABLE CHEST 1 VIEW COMPARISON:  One-view chest x-ray a 09/02/2014. FINDINGS: Low lung volumes exaggerate the heart size. Atherosclerotic calcifications are present at the arch. No edema or effusion to suggest failure. Ill-defined airspace disease is present at the left base. No other focal  airspace disease is present. The visualized soft tissues and bony thorax are unremarkable. IMPRESSION: 1. Low lung volumes. 2. Atherosclerosis of the thoracic aorta. 3. Ill-defined left lower lobe airspace disease. While this likely reflects atelectasis, early infection is also considered. Electronically Signed   By: San Morelle M.D.   On: 02/20/2016 23:59    Procedures Procedures (including critical care time)  Medications Ordered in ED Medications - No data to display   Initial Impression / Assessment and Plan / ED Course  I have reviewed the triage vital signs and the nursing notes.  Pertinent labs & imaging results that were available during my care of the patient were reviewed by me and considered in my medical decision making (see chart for details).  Clinical Course   Patient seen initially with Dr. Dina Rich. Goals of care discussed with family at bedside. We decided to obtain chest x-ray, basic labs to better assess status.  12:39 AM CXR and labs reviewed with Dr. Dina Rich. Wife now at bedside. We discussed reassuring results. Wife wants patient to return home.   Patient appears more comfortable and is sleeping. No respiratory distress. Lungs sound more clear.   They will follow-up with PCP tomorrow regarding resuming diuresis.   They will return with any worsening symptoms or other concerns.    Final Clinical Impressions(s) / ED Diagnoses   Final diagnoses:  SOB (shortness of breath)    New Prescriptions New Prescriptions   No medications on file      Carlisle Cater, PA-C 02/21/16 [id number]    Merryl Hacker, MD 02/21/16 [phone]

## 2016-02-20 NOTE — ED Triage Notes (Signed)
Pt comes from [facility] with difficulty breathing. Pt received fluids at facility for dehydration and is now sob with wet cough and audible crackles. Pt has #18 RW.  He is 100% on 3lpm [place].  Pt has advanced dementia and is receiving end of life care per EMS. DNR is present

## 2016-02-21 DIAGNOSIS — J80 Acute respiratory distress syndrome: Secondary | ICD-10-CM | POA: Diagnosis not present

## 2016-02-21 DIAGNOSIS — G301 Alzheimer's disease with late onset: Secondary | ICD-10-CM | POA: Diagnosis not present

## 2016-02-21 DIAGNOSIS — R1312 Dysphagia, oropharyngeal phase: Secondary | ICD-10-CM | POA: Diagnosis not present

## 2016-02-21 DIAGNOSIS — R0602 Shortness of breath: Secondary | ICD-10-CM | POA: Diagnosis not present

## 2016-02-21 LAB — BASIC METABOLIC PANEL
ANION GAP: 5 (ref 5–15)
BUN: 22 mg/dL — ABNORMAL HIGH (ref 6–20)
CALCIUM: 7.8 mg/dL — AB (ref 8.9–10.3)
CO2: 22 mmol/L (ref 22–32)
CREATININE: 1.51 mg/dL — AB (ref 0.61–1.24)
Chloride: 112 mmol/L — ABNORMAL HIGH (ref 101–111)
GFR calc Af Amer: 45 mL/min — ABNORMAL LOW (ref >60)
GFR, EST NON AFRICAN AMERICAN: 39 mL/min — AB (ref >60)
GLUCOSE: 115 mg/dL — AB (ref 65–99)
Potassium: 3.4 mmol/L — ABNORMAL LOW (ref 3.5–5.1)
Sodium: 139 mmol/L (ref 135–145)

## 2016-02-21 LAB — CBC WITH DIFFERENTIAL/PLATELET
Basophils Absolute: 0 10*3/uL (ref 0.0–0.1)
Basophils Relative: 0 %
EOS PCT: 2 %
Eosinophils Absolute: 0.2 10*3/uL (ref 0.0–0.7)
HCT: 40.5 % (ref 39.0–52.0)
Hemoglobin: 13 g/dL (ref 13.0–17.0)
LYMPHS ABS: 1.7 10*3/uL (ref 0.7–4.0)
LYMPHS PCT: 17 %
MCH: 30.4 pg (ref 26.0–34.0)
MCHC: 32.1 g/dL (ref 30.0–36.0)
MCV: 94.6 fL (ref 78.0–100.0)
MONO ABS: 0.8 10*3/uL (ref 0.1–1.0)
MONOS PCT: 8 %
Neutro Abs: 7.3 10*3/uL (ref 1.7–7.7)
Neutrophils Relative %: 73 %
PLATELETS: 214 10*3/uL (ref 150–400)
RBC: 4.28 MIL/uL (ref 4.22–5.81)
RDW: 13.9 % (ref 11.5–15.5)
WBC: 10.1 10*3/uL (ref 4.0–10.5)

## 2016-02-21 NOTE — Discharge Instructions (Signed)
Please read and follow all provided instructions.  Your diagnoses today include:  1. SOB (shortness of breath)     Tests performed today include:  Chest x-ray - no significant fluid in lungs, some haziness in lower left lobe seen previously  Blood counts and electrolytes - normal sodium, improving kidney function  Vital signs. See below for your results today.   Medications prescribed:   None  Take any prescribed medications only as directed.  Home care instructions:  Follow any educational materials contained in this packet.  BE VERY CAREFUL not to take multiple medicines containing Tylenol (also called acetaminophen). Doing so can lead to an overdose which can damage your liver and cause liver failure and possibly death.   Follow-up instructions: Please follow-up with your primary care provider tomorrow for further management.   Return instructions:   Please return to the Emergency Department if you experience worsening symptoms.   Please return if you have any other emergent concerns.  Additional Information:  Your vital signs today were: BP 131/88    Pulse 88    Resp (!) 29    SpO2 93%  If your blood pressure (BP) was elevated above 135/85 this visit, please have this repeated by your doctor within one month. --------------

## 2016-02-22 ENCOUNTER — Encounter: Payer: Self-pay | Admitting: Adult Health

## 2016-02-22 ENCOUNTER — Non-Acute Institutional Stay (SKILLED_NURSING_FACILITY): Payer: Medicare Other | Admitting: Adult Health

## 2016-02-22 DIAGNOSIS — J189 Pneumonia, unspecified organism: Secondary | ICD-10-CM | POA: Diagnosis not present

## 2016-02-22 DIAGNOSIS — F0391 Unspecified dementia with behavioral disturbance: Secondary | ICD-10-CM | POA: Diagnosis not present

## 2016-02-22 DIAGNOSIS — R634 Abnormal weight loss: Secondary | ICD-10-CM | POA: Diagnosis not present

## 2016-02-22 DIAGNOSIS — R627 Adult failure to thrive: Secondary | ICD-10-CM

## 2016-02-22 DIAGNOSIS — N184 Chronic kidney disease, stage 4 (severe): Secondary | ICD-10-CM | POA: Diagnosis not present

## 2016-02-22 DIAGNOSIS — R601 Generalized edema: Secondary | ICD-10-CM | POA: Diagnosis not present

## 2016-02-22 DIAGNOSIS — E860 Dehydration: Secondary | ICD-10-CM | POA: Diagnosis not present

## 2016-02-22 DIAGNOSIS — F03918 Unspecified dementia, unspecified severity, with other behavioral disturbance: Secondary | ICD-10-CM

## 2016-02-22 DIAGNOSIS — D649 Anemia, unspecified: Secondary | ICD-10-CM | POA: Diagnosis not present

## 2016-02-22 LAB — BASIC METABOLIC PANEL
BUN: 21 mg/dL (ref 4–21)
BUN: 29 mg/dL — AB (ref 4–21)
CREATININE: 1.3 mg/dL (ref 0.6–1.3)
GLUCOSE: 109 mg/dL
POTASSIUM: 3.9 mmol/L (ref 3.4–5.3)
Sodium: 141 mmol/L (ref 137–147)

## 2016-02-22 NOTE — Progress Notes (Addendum)
Location:   Customer service manager of Service:  SNF (31) Provider:   Cindi Carbon, ANP [place] [phone]   [name], [name], DO  Patient Care Team: [name], DO as PCP - General (Geriatric Medicine) [facility], NP as Nurse Practitioner (Nurse Practitioner)  Extended Emergency Contact Information Primary Emergency Contact: [name] Address: [address]          [place], [place] [address] [place] Phone: [phone] Relation: Spouse Secondary Emergency Contact: [name], [place] Phone: [phone] Relation: Daughter  Code Status:  DNR Goals of care: Advanced Directive information Advanced Directives 02/20/2016  Does patient have an advance directive? Yes  Type of Paramedic of [employer];Out of facility DNR (pink MOST or yellow form)  Does patient want to make changes to advanced directive? -  Copy of advanced directive(s) in chart? Yes  Pre-existing out of facility DNR order (yellow form or pink MOST form) Yellow form placed in chart (order not valid for inpatient use);Pink MOST form placed in chart (order not valid for inpatient use)     Chief Complaint  Patient presents with  . Acute Visit    f/u ER visit    HPI:  Pt is a 82 y.o. male seen on 8/10 for an acute visit for hypernatremia and pna.  Currently receiving Levquin 250 mg QD (dosed based on Cr Cl) for LLL pna. He was given IVF for ARF and hypernatremia and this resolved.  F/U Xray on 02/18/16 showed no CHF, only reactive airway disease and peribronchial thickening.  He was sent to the ER on the evening of 8/16 due to resp distress with decreased 02 sats. No changes were made regarding his care and he returned to the facility.  His CXR in the ER showed LLL airspace disease. He has been weaned from 02, 93% on room air.  He was started on Lasix for  volume overload at 40 mg with 40 meq of potassium.  His NA was 141 today, with BUN/CR 20.1/1.34, K of 3.9.  His weight has trended up by 2 lbs after two doses of 40 mg of lasix, at 217 lbs. The staff denies reports of dyspnea or decreased sats, cough is minimal.  He has dysphagia and is on a puree diet with NTL. He is eating five small meals a day with staff monitoring his intake closely due to progressive weight loss and dehydration. He ate 75-100% of his breakfast and consumed 750cc of fluid.  Foley cath in place due to enlarged nodular prostate, ouput on nights was 300cc.  His son contacted me today and asked that he be started on ambien 2.5 mg qhs as needed for sleep since the staff reports that he is not sleeping well at night.  Past Medical History:  Diagnosis Date  . Allergic rhinitis   . Alzheimer's disease 2013  . Atrial fibrillation ([place])    Long term anti coagulation w/ warfarin fro stroke risk reduction  . BPH (benign prostatic hyperplasia)   . Chronic venous insufficiency 01/12/2013  . Dementia with behavioral disturbance 12/29/13  . Gait disorder   . GERD (gastroesophageal reflux disease)   . History of CVA (cerebrovascular accident) 2001   Rt.MCA  . Hyperlipidemia   . Lactose intolerance   . Long term (current) use of anticoagulants 11/23/2012   Long-term anticoagulation with warfarin  for stroke risk reduction related to AF   Past Surgical History:  Procedure Laterality Date  . CATARACT EXTRACTION W/ INTRAOCULAR LENS  IMPLANT, BILATERAL    . HERNIA REPAIR Right 1970s  . KNEE SURGERY Right 2000  . MASS EXCISION Left 07/30/2013   Procedure: EXCISION OF BASAL CELL CARCINOMA  LEFT FOREHEAD ;  Surgeon: Irene Limbo, MD;  Location: [facility];  Service: Plastics;  Laterality: Left;  . RETINAL DETACHMENT SURGERY Right 2009    Allergies  Allergen Reactions  . Penicillins Rash  . Bee Venom   . Risperidone And Related     Unstable gait      Medication List         Accurate as of 02/22/16 11:16 AM. Always use your most recent med list.          ciclopirox 0.77 % cream Commonly known as:  LOPROX Apply topically 2 (two) times daily as needed (redness).   furosemide 40 MG tablet Commonly known as:  LASIX Take 40 mg by mouth.   ipratropium-albuterol 0.5-2.5 (3) MG/3ML Soln Commonly known as:  DUONEB Take 3 mLs by nebulization every 6 (six) hours as needed (for wheezing and cough). Nebulization give every 6 hours as needed for shortness of breath or wheezing   ipratropium-albuterol 0.5-2.5 (3) MG/3ML Soln Commonly known as:  DUONEB Take 3 mLs by nebulization 3 (three) times daily.   lactose free nutrition Liqd Take 237 mLs by mouth 2 (two) times daily between meals.   levofloxacin 250 MG tablet Commonly known as:  LEVAQUIN Take 250 mg by mouth daily.   nitroGLYCERIN 0.4 MG SL tablet Commonly known as:  NITROSTAT Place 0.4 mg under the tongue every 5 (five) minutes as needed for chest pain.   polyethylene glycol packet Commonly known as:  MIRALAX / GLYCOLAX Take 17 g by mouth daily.   potassium chloride SA 20 MEQ tablet Commonly known as:  K-DUR,KLOR-CON Take 40 mEq by mouth daily.   saccharomyces boulardii 250 MG capsule Commonly known as:  FLORASTOR Take 250 mg by mouth 2 (two) times daily.       Review of Systems  Unable to perform ROS: Dementia    Immunization History  Administered Date(s) Administered  . Influenza Whole 04/20/2013  . Influenza-Unspecified 04/12/2014, 04/20/2015  . PPD Test 11/29/2012   Pertinent  Health Maintenance Due  Topic Date Due  . PNA vac Low Risk Adult (1 of 2 - PCV13) 06/08/1990  . INFLUENZA VACCINE  02/06/2016   No flowsheet data found. Functional Status Survey:    Vitals:   02/22/16 1110  BP: 126/79  Pulse: 80  Resp: 18  Temp: 97.2 F (36.2 C)  SpO2: 98%  Weight: 217 lb (98.4 kg)   Body mass index is 29.43 kg/m.  Wt Readings from Last 3 Encounters:  02/22/16 217 lb  (98.4 kg)  02/16/16 203 lb (92.1 kg)  02/15/16 203 lb 1.6 oz (92.1 kg)   Physical Exam  Constitutional: No distress.  HENT:  Head: Normocephalic and atraumatic.  Nose: Nose normal.  Eyes: Conjunctivae are normal. Pupils are equal, round, and reactive to light. Right eye exhibits no discharge. Left eye exhibits no discharge.  Neck: Normal range of motion. Neck supple. No JVD present. No tracheal deviation present. No thyromegaly present.  Cardiovascular:  Irregular, BLE edema +2  Pulmonary/Chest: Effort normal. He has wheezes (mild exp wheeze).  Abdominal: Soft. Bowel sounds are normal. There is no tenderness.  Lymphadenopathy:    He  has no cervical adenopathy.  Neurological: He is alert.  Not able to f/c  . No obvious focal deficit.   Skin: Skin is warm and dry. He is not diaphoretic.  Nursing note and vitals reviewed.   Labs reviewed:  Recent Labs  02/19/16 02/20/16 2355 02/22/16  NA 143 139 141  K 3.8 3.4* 3.9  CL  --  112*  --   CO2  --  22  --   GLUCOSE  --  115*  --   BUN 32* 22* 21  29*  CREATININE 1.7* 1.51* 1.3  CALCIUM  --  7.8*  --   29 on the BUN was entered in error on 8/17, correct number is 21  Recent Labs  06/20/15 08/31/15  AST 14 15  ALT 13 12  ALKPHOS 88 99    Recent Labs  09/26/15 02/15/16 02/20/16 2355  WBC 8.2 9.7 10.1  NEUTROABS  --   --  7.3  HGB 14.7 16.3 13.0  HCT 42 54* 40.5  MCV  --   --  94.6  PLT 242 269 214   Lab Results  Component Value Date   TSH 3.83 01/29/2016   No results found for: HGBA1C Lab Results  Component Value Date   CHOL 155 08/31/2015   HDL 33 (A) 08/31/2015   LDLCALC 99 08/31/2015   TRIG 116 08/31/2015  2 D echo 06/22/14 Study Conclusions  - Left ventricle: The cavity size was normal. There was mild focal basal hypertrophy of the septum. Systolic function was normal. The estimated ejection fraction was in the range of 50% to 55%. Although no diagnostic regional wall motion abnormality  was identified, this possibility cannot be completely excluded on the basis of this study. - Aortic valve: Cusp separation was reduced. - Mitral valve: Calcified annulus. Mildly thickened leaflets . - Left atrium: The atrium was mildly dilated.  Significant Diagnostic Results in last 30 days:  Dg Chest Port 1 View  Result Date: 02/20/2016 CLINICAL DATA:  Shortness breath.  Difficulty breathing. EXAM: PORTABLE CHEST 1 VIEW COMPARISON:  One-view chest x-ray a 09/02/2014. FINDINGS: Low lung volumes exaggerate the heart size. Atherosclerotic calcifications are present at the arch. No edema or effusion to suggest failure. Ill-defined airspace disease is present at the left base. No other focal airspace disease is present. The visualized soft tissues and bony thorax are unremarkable. IMPRESSION: 1. Low lung volumes. 2. Atherosclerosis of the thoracic aorta. 3. Ill-defined left lower lobe airspace disease. While this likely reflects atelectasis, early infection is also considered. Electronically Signed   By: San Morelle M.D.   On: 02/20/2016 23:59    Assessment/Plan  1. Generalized edema No further resp distress noted, now off 02 Edema is due to venous insuff and probably diastolic chf Unchanged, weight up by 2 lbs Continue lasix at 40 mg qd with Kdur 40 meq qd Continue to monitor intakes, weights, and BMP q 48 hrs  2. FTT (failure to thrive) in adult -progressive weight loss and dementia -continue 5 small meals a day with monitoring of intake -has low albumin, on supplements per nutritionist  3. Chronic kidney disease (CKD), stage IV (severe) (HCC) Improved Continue to monitor and avoid nephrotoxic agents  4. HCAP (healthcare-associated pneumonia) Unchanged xray findings, would repeat xray next week Continue levaquin to complete 7 days Continue duonebs Oxygen as needed for 02 sats <90%  5. Dementia with behavioral disturbance -Ambien 2.5 mg qhs prn sleep per son's  request -off ativan/depakote and is agitated  at night, would reconsider adding back depakote if this continues  Cindi Carbon, [place] [phone]

## 2016-02-23 DIAGNOSIS — R1312 Dysphagia, oropharyngeal phase: Secondary | ICD-10-CM | POA: Diagnosis not present

## 2016-02-23 DIAGNOSIS — G301 Alzheimer's disease with late onset: Secondary | ICD-10-CM | POA: Diagnosis not present

## 2016-02-24 DIAGNOSIS — R634 Abnormal weight loss: Secondary | ICD-10-CM | POA: Diagnosis not present

## 2016-02-24 DIAGNOSIS — E860 Dehydration: Secondary | ICD-10-CM | POA: Diagnosis not present

## 2016-02-26 DIAGNOSIS — R1312 Dysphagia, oropharyngeal phase: Secondary | ICD-10-CM | POA: Diagnosis not present

## 2016-02-26 DIAGNOSIS — G301 Alzheimer's disease with late onset: Secondary | ICD-10-CM | POA: Diagnosis not present

## 2016-02-26 DIAGNOSIS — E860 Dehydration: Secondary | ICD-10-CM | POA: Diagnosis not present

## 2016-02-26 DIAGNOSIS — R634 Abnormal weight loss: Secondary | ICD-10-CM | POA: Diagnosis not present

## 2016-02-28 DIAGNOSIS — J189 Pneumonia, unspecified organism: Secondary | ICD-10-CM | POA: Diagnosis not present

## 2016-02-29 DIAGNOSIS — E860 Dehydration: Secondary | ICD-10-CM | POA: Diagnosis not present

## 2016-02-29 DIAGNOSIS — R634 Abnormal weight loss: Secondary | ICD-10-CM | POA: Diagnosis not present

## 2016-02-29 LAB — BASIC METABOLIC PANEL
BUN: 22 mg/dL — AB (ref 4–21)
CREATININE: 1.2 mg/dL (ref 0.6–1.3)
GLUCOSE: 106 mg/dL
Potassium: 4.4 mmol/L (ref 3.4–5.3)
SODIUM: 139 mmol/L (ref 137–147)

## 2016-03-01 ENCOUNTER — Non-Acute Institutional Stay (SKILLED_NURSING_FACILITY): Payer: Medicare Other | Admitting: Adult Health

## 2016-03-01 ENCOUNTER — Encounter: Payer: Self-pay | Admitting: Adult Health

## 2016-03-01 DIAGNOSIS — R339 Retention of urine, unspecified: Secondary | ICD-10-CM | POA: Diagnosis not present

## 2016-03-01 DIAGNOSIS — R627 Adult failure to thrive: Secondary | ICD-10-CM

## 2016-03-01 DIAGNOSIS — R601 Generalized edema: Secondary | ICD-10-CM | POA: Diagnosis not present

## 2016-03-01 DIAGNOSIS — J189 Pneumonia, unspecified organism: Secondary | ICD-10-CM

## 2016-03-01 NOTE — Progress Notes (Signed)
Location:   Customer service manager of Service:  SNF (31) Provider:   Cindi Carbon, ANP [place] [phone]   [name], [name], DO  Patient Care Team: [name], DO as PCP - General (Geriatric Medicine) Well [place], NP as Nurse Practitioner (Nurse Practitioner)  Extended Emergency Contact Information Primary Emergency Contact: [name] Address: [address]          [place], [place] [address] [place] Phone: [phone] Relation: Spouse Secondary Emergency Contact: [name], [place] Phone: [phone] Relation: Daughter  Code Status:  DNR Goals of care: Advanced Directive information Advanced Directives 02/20/2016  Does patient have an advance directive? Yes  Type of Paramedic of [place];Out of facility DNR (pink MOST or yellow form)  Does patient want to make changes to advanced directive? -  Copy of advanced directive(s) in chart? Yes  Pre-existing out of facility DNR order (yellow form or pink MOST form) Yellow form placed in chart (order not valid for inpatient use);Pink MOST form placed in chart (order not valid for inpatient use)     Chief Complaint  Patient presents with  . Acute Visit    f/u poor intake    HPI:  Pt is a 82 y.o. male seen on 8/10 for an acute visit for hypernatremia and pna. He has completed his course of Levaquin started on 8/11 for LLL pneumonia.  F/U Xray on 8//23 showed no CHF, no acute pulmonary findings. He is taking Duonebs 3X/day.    His weight has been stable over the past week since taking Lasix 40 mg daily.  He has dysphagia and is on a puree diet with NTL and is working with [facility].  His son is a physician and has been very involved in his care. He would like to change his diet to 5 small meals a day with calorie counts daily and frequent weight monitoring.    He has  chronic edema to lower extremities. This is multifactorial due to low albumin, venous insufficiency, and heart failure. His calf circumference of 13" bilat has remained consistent over the past week. He is wheelchair bound and does not ambulate.   He has a foley due to urinary retention secondary to BPH. Foley was d/c'ed on 8/21, but was replaced on 8/22 due to urinary retention.  He is taking Flomax. His urine output was 1300 in the last 24 hrs.    Past Medical History:  Diagnosis Date  . Allergic rhinitis   . Alzheimer's disease 2013  . Atrial fibrillation ([place])    Long term anti coagulation w/ warfarin fro stroke risk reduction  . BPH (benign prostatic hyperplasia)   . Chronic venous insufficiency 01/12/2013  . Dementia with behavioral disturbance 12/29/13  . Gait disorder   . GERD (gastroesophageal reflux disease)   . History of CVA (cerebrovascular accident) 2001   Rt.MCA  . Hyperlipidemia   . Lactose intolerance   . Long term (current) use of anticoagulants 11/23/2012   Long-term anticoagulation with warfarin for stroke risk reduction related to AF   Past Surgical History:  Procedure Laterality Date  . CATARACT EXTRACTION W/ INTRAOCULAR LENS  IMPLANT, BILATERAL    . HERNIA REPAIR Right 1970s  . KNEE SURGERY Right 2000  . MASS EXCISION Left 07/30/2013   Procedure: EXCISION OF BASAL CELL CARCINOMA  LEFT FOREHEAD ;  Surgeon:  Irene Limbo, MD;  Location: [facility];  Service: Plastics;  Laterality: Left;  . RETINAL DETACHMENT SURGERY Right 2009    Allergies  Allergen Reactions  . Penicillins Rash  . Bee Venom   . Risperidone And Related     Unstable gait      Medication List       Accurate as of 03/01/16 10:10 AM. Always use your most recent med list.          ciclopirox 0.77 % cream Commonly known as:  LOPROX Apply topically 2 (two) times daily as needed (redness).   ELIQUIS 2.5 MG Tabs tablet Generic drug:  apixaban Take 2.5 mg by mouth 2 (two)  times daily.   furosemide 40 MG tablet Commonly known as:  LASIX Take 40 mg by mouth.   ipratropium-albuterol 0.5-2.5 (3) MG/3ML Soln Commonly known as:  DUONEB Take 3 mLs by nebulization every 6 (six) hours as needed (for wheezing and cough). Nebulization give every 6 hours as needed for shortness of breath or wheezing   ipratropium-albuterol 0.5-2.5 (3) MG/3ML Soln Commonly known as:  DUONEB Take 3 mLs by nebulization 3 (three) times daily.   lactose free nutrition Liqd Take 237 mLs by mouth 2 (two) times daily between meals.   nitroGLYCERIN 0.4 MG SL tablet Commonly known as:  NITROSTAT Place 0.4 mg under the tongue every 5 (five) minutes as needed for chest pain.   polyethylene glycol packet Commonly known as:  MIRALAX / GLYCOLAX Take 17 g by mouth daily.   potassium chloride SA 20 MEQ tablet Commonly known as:  K-DUR,KLOR-CON Take 40 mEq by mouth daily.   saccharomyces boulardii 250 MG capsule Commonly known as:  FLORASTOR Take 250 mg by mouth 2 (two) times daily.   tamsulosin 0.4 MG Caps capsule Commonly known as:  FLOMAX Take 0.4 mg by mouth at bedtime.   zolpidem 5 MG tablet Commonly known as:  AMBIEN Take 2.5 mg by mouth at bedtime as needed for sleep.       Review of Systems  Unable to perform ROS: Dementia    Immunization History  Administered Date(s) Administered  . Influenza Whole 04/20/2013  . Influenza-Unspecified 04/12/2014, 04/20/2015  . PPD Test 11/29/2012   Pertinent  Health Maintenance Due  Topic Date Due  . PNA vac Low Risk Adult (1 of 2 - PCV13) 06/08/1990  . INFLUENZA VACCINE  02/06/2016   No flowsheet data found. Functional Status Survey:    Vitals:   03/01/16 0959  BP: 122/77  Pulse: 90  Resp: 18  Temp: 97.2 F (36.2 C)  SpO2: 95%  Weight: 216 lb 1.6 oz (98 kg)   Body mass index is 29.31 kg/m.  Wt Readings from Last 3 Encounters:  03/01/16 216 lb 1.6 oz (98 kg)  02/22/16 217 lb (98.4 kg)  02/16/16 203 lb (92.1 kg)     Physical Exam  HENT:  Nose: Nose normal.  Moist mucous membranes  Eyes: Conjunctivae are normal. Pupils are equal, round, and reactive to light. Right eye exhibits no discharge. Left eye exhibits no discharge.  Neck: Normal range of motion. Neck supple. No JVD present. No tracheal deviation present. No thyromegaly present.  Cardiovascular: Normal rate, regular rhythm and normal heart sounds.   Irregular, BLE edema +3  Pulmonary/Chest: No respiratory distress. He has no wheezes.  Mild crackles to lower lobes  Musculoskeletal: He exhibits no edema.  Lymphadenopathy:    He has no cervical adenopathy.  Neurological: He is alert.  Not able to f/c  . No obvious focal deficit.   Skin: Skin is warm and dry.  Psychiatric: He has a normal mood and affect.  Nursing note and vitals reviewed.   Labs reviewed:  Recent Labs  02/20/16 2355 02/22/16 02/29/16  NA 139 141 139  K 3.4* 3.9 4.4  CL 112*  --   --   CO2 22  --   --   GLUCOSE 115*  --   --   BUN 22* 21  29* 22*  CREATININE 1.51* 1.3 1.2  CALCIUM 7.8*  --   --     Recent Labs  06/20/15 08/31/15  AST 14 15  ALT 13 12  ALKPHOS 88 99    Recent Labs  09/26/15 02/15/16 02/20/16 2355  WBC 8.2 9.7 10.1  NEUTROABS  --   --  7.3  HGB 14.7 16.3 13.0  HCT 42 54* 40.5  MCV  --   --  94.6  PLT 242 269 214   Lab Results  Component Value Date   TSH 3.83 01/29/2016   No results found for: HGBA1C Lab Results  Component Value Date   CHOL 155 08/31/2015   HDL 33 (A) 08/31/2015   LDLCALC 99 08/31/2015   TRIG 116 08/31/2015  2 D echo 06/22/14 Study Conclusions  - Left ventricle: The cavity size was normal. There was mild focal basal hypertrophy of the septum. Systolic function was normal. The estimated ejection fraction was in the range of 50% to 55%. Although no diagnostic regional wall motion abnormality was identified, this possibility cannot be completely excluded on the basis of this study. - Aortic  valve: Cusp separation was reduced. - Mitral valve: Calcified annulus. Mildly thickened leaflets . - Left atrium: The atrium was mildly dilated.  Significant Diagnostic Results in last 30 days:  Dg Chest Port 1 View  Result Date: 02/20/2016 CLINICAL DATA:  Shortness breath.  Difficulty breathing. EXAM: PORTABLE CHEST 1 VIEW COMPARISON:  One-view chest x-ray a 09/02/2014. FINDINGS: Low lung volumes exaggerate the heart size. Atherosclerotic calcifications are present at the arch. No edema or effusion to suggest failure. Ill-defined airspace disease is present at the left base. No other focal airspace disease is present. The visualized soft tissues and bony thorax are unremarkable. IMPRESSION: 1. Low lung volumes. 2. Atherosclerosis of the thoracic aorta. 3. Ill-defined left lower lobe airspace disease. While this likely reflects atelectasis, early infection is also considered. Electronically Signed   By: San Morelle M.D.   On: 02/20/2016 23:59    Assessment/Plan  1. HCAP (healthcare-associated pneumonia) Resolved Change duonebs to every 6 hrs prn  2. FTT (failure to thrive) in adult Progressive weight loss in the setting of dementia.  Has stable weights the past week but has increasing edema and poor intake which may be balancing each other out.  Boost plus BID and small frequent meals.  Would consider hospice.    3. Generalized edema Worsening edema in the feet but no resp distress. Would continue compression hose and elevation.  Continue lasix 40 mg qd with Kdur 40 MEQ qd.  I would not go up on the lasix at this point due to his poor intake and recent bout of hypernatremia unless resp distress develops. Has underlying low albumin due to poor intake and recent illness.  -Continue BMPs on Mon and Thurs -Encourage fluids. Continue daily weights/I/O  4. Urinary retention -Remove foley cath on Mon and in/out cath as needed q 8 hrs, if catheterization required twice  in 24 hrs would leave  in -Continue flomax 0.4 mg qhs    Cindi Carbon, [place] [phone]

## 2016-03-04 DIAGNOSIS — R634 Abnormal weight loss: Secondary | ICD-10-CM | POA: Diagnosis not present

## 2016-03-04 DIAGNOSIS — E860 Dehydration: Secondary | ICD-10-CM | POA: Diagnosis not present

## 2016-03-07 DIAGNOSIS — E860 Dehydration: Secondary | ICD-10-CM | POA: Diagnosis not present

## 2016-03-07 DIAGNOSIS — Z79899 Other long term (current) drug therapy: Secondary | ICD-10-CM | POA: Diagnosis not present

## 2016-03-11 DIAGNOSIS — E860 Dehydration: Secondary | ICD-10-CM | POA: Diagnosis not present

## 2016-03-11 DIAGNOSIS — R634 Abnormal weight loss: Secondary | ICD-10-CM | POA: Diagnosis not present

## 2016-03-14 DIAGNOSIS — E860 Dehydration: Secondary | ICD-10-CM | POA: Diagnosis not present

## 2016-03-14 DIAGNOSIS — R634 Abnormal weight loss: Secondary | ICD-10-CM | POA: Diagnosis not present

## 2016-03-14 LAB — BASIC METABOLIC PANEL
BUN: 20 mg/dL (ref 4–21)
Creatinine: 1 mg/dL (ref 0.6–1.3)
Glucose: 83 mg/dL
Potassium: 4 mmol/L (ref 3.4–5.3)
Sodium: 139 mmol/L (ref 137–147)

## 2016-03-18 DIAGNOSIS — R634 Abnormal weight loss: Secondary | ICD-10-CM | POA: Diagnosis not present

## 2016-03-18 DIAGNOSIS — E860 Dehydration: Secondary | ICD-10-CM | POA: Diagnosis not present

## 2016-03-18 LAB — BASIC METABOLIC PANEL
BUN: 21 mg/dL (ref 4–21)
Creatinine: 1.2 mg/dL (ref 0.6–1.3)
Glucose: 94 mg/dL
Potassium: 4.2 mmol/L (ref 3.4–5.3)
Sodium: 141 mmol/L (ref 137–147)

## 2016-03-25 DIAGNOSIS — R634 Abnormal weight loss: Secondary | ICD-10-CM | POA: Diagnosis not present

## 2016-03-25 DIAGNOSIS — E860 Dehydration: Secondary | ICD-10-CM | POA: Diagnosis not present

## 2016-03-26 ENCOUNTER — Encounter: Payer: Self-pay | Admitting: Internal Medicine

## 2016-03-26 ENCOUNTER — Non-Acute Institutional Stay (SKILLED_NURSING_FACILITY): Payer: Medicare Other | Admitting: Internal Medicine

## 2016-03-26 DIAGNOSIS — I482 Chronic atrial fibrillation, unspecified: Secondary | ICD-10-CM

## 2016-03-26 DIAGNOSIS — N183 Chronic kidney disease, stage 3 unspecified: Secondary | ICD-10-CM

## 2016-03-26 DIAGNOSIS — J189 Pneumonia, unspecified organism: Secondary | ICD-10-CM

## 2016-03-26 DIAGNOSIS — F0391 Unspecified dementia with behavioral disturbance: Secondary | ICD-10-CM

## 2016-03-26 DIAGNOSIS — Z7901 Long term (current) use of anticoagulants: Secondary | ICD-10-CM | POA: Diagnosis not present

## 2016-03-26 DIAGNOSIS — I872 Venous insufficiency (chronic) (peripheral): Secondary | ICD-10-CM | POA: Diagnosis not present

## 2016-03-26 DIAGNOSIS — I5032 Chronic diastolic (congestive) heart failure: Secondary | ICD-10-CM

## 2016-03-26 NOTE — Progress Notes (Signed)
Patient ID: Bryan [name], male   DOB: 30-Nov-1922, 82 y.o.   MRN: [id number]  Location:  [facility] Room Number: 916 memory care Place of Service:  SNF (31) Provider:  Gaberiel Youngblood L. Mariea Clonts, D.O., C.M.D.  Hollace Kinnier, DO  Patient Care Team: [name], DO as PCP - General (Geriatric Medicine) Well [place], NP as Nurse Practitioner (Nurse Practitioner)  Extended Emergency Contact Information Primary Emergency Contact: [name] Address: [address]          [place], [place] [address] [place] Phone: [phone] Relation: Spouse Secondary Emergency Contact: [name], [place] Phone: [phone] Relation: Daughter  Code Status:  DNR Goals of care: Advanced Directive information Advanced Directives 03/26/2016  Does patient have an advance directive? -  Type of Advance Directive Out of facility DNR (pink MOST or yellow form);Healthcare Power of Attorney  Does patient want to make changes to advanced directive? -  Copy of advanced directive(s) in chart? Yes  Pre-existing out of facility DNR order (yellow form or pink MOST form) Yellow form placed in chart (order not valid for inpatient use);Pink MOST form placed in chart (order not valid for inpatient use)     Chief Complaint  Patient presents with  . Medical Management of Chronic Issues    routine visit    HPI:  Pt is a 82 y.o. male seen today for medical management of chronic diseases.   Bryan [name] has recovered from his LLL pneumonia from August.  He was treated with levaquin.    CHF:  Weight 208 today which is down 8 lbs from 8/25.  His weight is measured daily and lasix adjusted accordingly.  His chf has been very difficult to regulate.  Dysphagia:  Ongoing.  Has modified diet and his son had asked that he have more smaller meals.    His calf circumference  measurement is meant to be around 13" bilaterally when he is stable.    He has had difficulty with urinary retention requiring a foley.  He is also on flomax.    He is in chronic afib on eliquis anticoagulation.    Past Medical History:  Diagnosis Date  . Allergic rhinitis   . Alzheimer's disease 2013  . Atrial fibrillation ([place])    Long term anti coagulation w/ warfarin fro stroke risk reduction  . BPH (benign prostatic hyperplasia)   . Chronic venous insufficiency 01/12/2013  . Dementia with behavioral disturbance 12/29/13  . Gait disorder   . GERD (gastroesophageal reflux disease)   . History of CVA (cerebrovascular accident) 2001   Rt.MCA  . Hyperlipidemia   . Lactose intolerance   . Long term (current) use of anticoagulants 11/23/2012   Long-term anticoagulation with warfarin for stroke risk reduction related to AF   Past Surgical History:  Procedure Laterality Date  . CATARACT EXTRACTION W/ INTRAOCULAR LENS  IMPLANT, BILATERAL    . HERNIA REPAIR Right 1970s  . KNEE SURGERY Right 2000  . MASS EXCISION Left 07/30/2013   Procedure: EXCISION OF BASAL CELL CARCINOMA  LEFT FOREHEAD ;  Surgeon: Irene Limbo, MD;  Location: [facility];  Service: Plastics;  Laterality: Left;  . RETINAL DETACHMENT SURGERY Right 2009    Allergies  Allergen Reactions  . Penicillins Rash  . Bee Venom   . Risperidone And Related     Unstable gait  Medication List       Accurate as of 03/26/16  1:46 PM. Always use your most recent med list.          ciclopirox 0.77 % cream Commonly known as:  LOPROX Apply topically 2 (two) times daily as needed (redness).   ELIQUIS 2.5 MG Tabs tablet Generic drug:  apixaban Take 2.5 mg by mouth 2 (two) times daily.   furosemide 40 MG tablet Commonly known as:  LASIX Take 40 mg by mouth.   ipratropium-albuterol 0.5-2.5 (3) MG/3ML Soln Commonly known as:  DUONEB Take 3 mLs by nebulization every 6 (six) hours as needed (for  wheezing and cough).   lactose free nutrition Liqd Take 237 mLs by mouth 2 (two) times daily between meals.   nitroGLYCERIN 0.4 MG SL tablet Commonly known as:  NITROSTAT Place 0.4 mg under the tongue every 5 (five) minutes as needed for chest pain.   polyethylene glycol packet Commonly known as:  MIRALAX / GLYCOLAX Take 17 g by mouth daily.   potassium chloride SA 20 MEQ tablet Commonly known as:  K-DUR,KLOR-CON Take 40 mEq by mouth daily.   tamsulosin 0.4 MG Caps capsule Commonly known as:  FLOMAX Take 0.4 mg by mouth at bedtime.   zolpidem 5 MG tablet Commonly known as:  AMBIEN Take 2.5 mg by mouth at bedtime as needed for sleep.       Review of Systems  Constitutional: Negative for activity change, appetite change, chills and fever.  HENT: Negative for congestion.   Respiratory: Negative for cough and shortness of breath.   Cardiovascular: Positive for leg swelling. Negative for chest pain.  Gastrointestinal: Positive for constipation. Negative for abdominal distention.  Genitourinary: Negative for dysuria.       Urinary retention  Musculoskeletal: Positive for arthralgias. Negative for gait problem and joint swelling.       Not ambulatory  Skin: Negative for color change.  Neurological: Negative for dizziness and weakness.  Hematological: Bruises/bleeds easily.  Psychiatric/Behavioral: Positive for agitation, behavioral problems and confusion.       With care at times especially bathing    Immunization History  Administered Date(s) Administered  . Influenza Whole 04/20/2013  . Influenza-Unspecified 04/12/2014, 04/20/2015  . PPD Test 11/29/2012   Pertinent  Health Maintenance Due  Topic Date Due  . PNA vac Low Risk Adult (1 of 2 - PCV13) 06/08/1990  . INFLUENZA VACCINE  02/06/2016   No flowsheet data found. Functional Status Survey:    Vitals:   03/26/16 1321  BP: 99/67  Pulse: 91  Resp: 16  Temp: 97.6 F (36.4 C)  TempSrc: Oral  SpO2: 98%    Weight: 208 lb (94.3 kg)   Body mass index is 28.21 kg/m. Physical Exam  Labs reviewed:  Recent Labs  02/20/16 2355  02/29/16 03/14/16 0600 03/18/16 0630  NA 139  < > 139 139 141  K 3.4*  < > 4.4 4.0 4.2  CL 112*  --   --   --   --   CO2 22  --   --   --   --   GLUCOSE 115*  --   --   --   --   BUN 22*  < > 22* 20 21  CREATININE 1.51*  < > 1.2 1.0 1.2  CALCIUM 7.8*  --   --   --   --   < > = values in this interval not displayed.  Recent Labs  06/20/15 08/31/15  AST  14 15  ALT 13 12  ALKPHOS 88 99    Recent Labs  09/26/15 02/15/16 02/20/16 2355  WBC 8.2 9.7 10.1  NEUTROABS  --   --  7.3  HGB 14.7 16.3 13.0  HCT 42 54* 40.5  MCV  --   --  94.6  PLT 242 269 214   Lab Results  Component Value Date   TSH 3.83 01/29/2016   No results found for: HGBA1C Lab Results  Component Value Date   CHOL 155 08/31/2015   HDL 33 (A) 08/31/2015   LDLCALC 99 08/31/2015   TRIG 116 08/31/2015    Assessment/Plan 1. HCAP (healthcare-associated pneumonia) -resolved  2. Chronic diastolic congestive heart failure ([place]) -requiring close monitoring and med adjustments with I/O, daily weights, leg circ measurements -cont lasix 40mg  daily  3. Chronic venous insufficiency -cont to elevate feet at rest, compression hose, measure calves  4. Dementia with behavioral disturbance, unspecified dementia type -minimally verbal (speech not coherent), dependent in adls, nonambulatory, some contractures of legs making bathing challenging and causing him to get agitated, no longer on any pain medication even tylenol now -not on dementia meds which is certainly appropriate at this advanced stage  5. CKD (chronic kidney disease) stage 3, GFR 30-59 ml/min -monitoring carefully with diuretic adjustments -maintain adequate hydration also -avoid nephrotoxic meds and dose adjust appropriately (eliquis)  6. Long term current use of anticoagulant therapy -cont to monitor renal function and cont  eliquis due to afib and stroke risk (suspect dementia is vascular and due to small strokes from his afib with emboli in the past)  7. Chronic atrial fibrillation (H[facility]) -cont eliquis, rate is controlled, not on rate control drugs   Family/ staff Communication: discussed with memory care nursing  Labs/tests ordered:  No new

## 2016-04-01 DIAGNOSIS — R634 Abnormal weight loss: Secondary | ICD-10-CM | POA: Diagnosis not present

## 2016-04-01 DIAGNOSIS — E860 Dehydration: Secondary | ICD-10-CM | POA: Diagnosis not present

## 2016-04-08 DIAGNOSIS — E860 Dehydration: Secondary | ICD-10-CM | POA: Diagnosis not present

## 2016-04-08 DIAGNOSIS — R634 Abnormal weight loss: Secondary | ICD-10-CM | POA: Diagnosis not present

## 2016-04-10 ENCOUNTER — Non-Acute Institutional Stay (SKILLED_NURSING_FACILITY): Payer: Medicare Other | Admitting: Internal Medicine

## 2016-04-10 DIAGNOSIS — F0391 Unspecified dementia with behavioral disturbance: Secondary | ICD-10-CM

## 2016-04-10 DIAGNOSIS — R627 Adult failure to thrive: Secondary | ICD-10-CM | POA: Diagnosis not present

## 2016-04-10 DIAGNOSIS — Z79899 Other long term (current) drug therapy: Secondary | ICD-10-CM | POA: Diagnosis not present

## 2016-04-10 DIAGNOSIS — N184 Chronic kidney disease, stage 4 (severe): Secondary | ICD-10-CM

## 2016-04-10 DIAGNOSIS — N390 Urinary tract infection, site not specified: Secondary | ICD-10-CM

## 2016-04-10 DIAGNOSIS — R0902 Hypoxemia: Secondary | ICD-10-CM | POA: Diagnosis not present

## 2016-04-10 DIAGNOSIS — R0682 Tachypnea, not elsewhere classified: Secondary | ICD-10-CM | POA: Diagnosis not present

## 2016-04-10 DIAGNOSIS — R062 Wheezing: Secondary | ICD-10-CM | POA: Diagnosis not present

## 2016-04-10 DIAGNOSIS — R509 Fever, unspecified: Secondary | ICD-10-CM | POA: Diagnosis not present

## 2016-04-10 DIAGNOSIS — A419 Sepsis, unspecified organism: Secondary | ICD-10-CM | POA: Diagnosis not present

## 2016-04-10 DIAGNOSIS — R000 Tachycardia, unspecified: Secondary | ICD-10-CM | POA: Diagnosis not present

## 2016-04-10 DIAGNOSIS — R319 Hematuria, unspecified: Secondary | ICD-10-CM | POA: Diagnosis not present

## 2016-04-10 NOTE — Progress Notes (Signed)
Location:  Occupational psychologist of Service:  SNF (31) Provider:  Geniya Fulgham L. Mariea Clonts, D.O., C.M.D.  Hollace Kinnier, DO  Patient Care Team: [name], DO as PCP - General (Geriatric Medicine) Well [place], NP as Nurse Practitioner (Nurse Practitioner)  Extended Emergency Contact Information Primary Emergency Contact: [name] Address: [address]          [place], [place] [address] [place] Phone: [phone] Relation: Spouse Secondary Emergency Contact: [name],  [place] Phone: [phone] Relation: Daughter  Code Status:  DNR Goals of care: Advanced Directive information Advanced Directives 04/29/2016  Does patient have an advance directive? Yes  Type of Advance Directive Out of facility DNR (pink MOST or yellow form)  Does patient want to make changes to advanced directive? -  Copy of advanced directive(s) in chart? Yes  Pre-existing out of facility DNR order (yellow form or pink MOST form) Pink MOST form placed in chart (order not valid for inpatient use);Yellow form placed in chart (order not valid for inpatient use)    Chief Complaint  Patient presents with  . Acute Visit    fever, tachycardia, tachypnea, hypoxia, normal CXR, elevated wbc, dirty UA, poor intake past 12-24 hrs, lethargic    HPI:  Pt is a 82 y.o. male seen today for an acute visit for fever, tachycardia, tachypnea, hypoxia, normal chest xray, leukocytosis, positive UA and poor po intake for the past 12-24 hrs due to lethargy.    11-7 I 100 and O 100, fever of 100.7 down to 97.1 with tylenol, 7-3 I 230, O not listed, had fever of 102.2 and got tylenol, current shift temp 99.2, then 97.7  Past Medical History:  Diagnosis Date  . Allergic rhinitis   . Alzheimer's disease 2013  . Atrial fibrillation ([place])    Long term anti coagulation w/ warfarin fro stroke  risk reduction  . BPH (benign prostatic hyperplasia)   . Chronic venous insufficiency 01/12/2013  . Dementia with behavioral disturbance 12/29/13  . Gait disorder   . GERD (gastroesophageal reflux disease)   . History of CVA (cerebrovascular accident) 2001   Rt.MCA  . Hyperlipidemia   . Lactose intolerance   . Long term (current) use of anticoagulants 11/23/2012   Long-term anticoagulation with warfarin for stroke risk reduction related to AF   Past Surgical History:  Procedure Laterality Date  . CATARACT EXTRACTION W/ INTRAOCULAR LENS  IMPLANT, BILATERAL    . HERNIA REPAIR Right 1970s  . KNEE SURGERY Right 2000  . MASS EXCISION Left 07/30/2013   Procedure: EXCISION OF BASAL CELL CARCINOMA  LEFT FOREHEAD ;  Surgeon: Irene Limbo, MD;  Location: [facility];  Service: Plastics;  Laterality: Left;  . RETINAL DETACHMENT SURGERY Right 2009    Allergies  Allergen Reactions  . Penicillins Rash  . Bee Venom   . Risperidone And Related     Unstable gait      Medication List       Accurate as of 04/10/16 11:59 PM. Always use your most recent med list.          ciclopirox 0.77 % cream Commonly known as:  LOPROX Apply topically 2 (two) times daily as needed (redness).   ELIQUIS 2.5 MG Tabs tablet Generic drug:  apixaban Take 2.5 mg by mouth 2 (two) times daily.   furosemide 40 MG tablet  Commonly known as:  LASIX Take 40 mg by mouth.   ipratropium-albuterol 0.5-2.5 (3) MG/3ML Soln Commonly known as:  DUONEB Take 3 mLs by nebulization every 6 (six) hours as needed (for wheezing and cough).   lactose free nutrition Liqd Take 237 mLs by mouth 2 (two) times daily between meals.   nitroGLYCERIN 0.4 MG SL tablet Commonly known as:  NITROSTAT Place 0.4 mg under the tongue every 5 (five) minutes as needed for chest pain.   polyethylene glycol packet Commonly known as:  MIRALAX / GLYCOLAX Take 17 g by mouth daily.   potassium chloride SA 20 MEQ  tablet Commonly known as:  K-DUR,KLOR-CON Take 20 mEq by mouth daily.   tamsulosin 0.4 MG Caps capsule Commonly known as:  FLOMAX Take 0.4 mg by mouth at bedtime. Hold for SBP < 95   zolpidem 5 MG tablet Commonly known as:  AMBIEN Take 2.5 mg by mouth at bedtime as needed for sleep.       Review of Systems  Unable to perform ROS: Dementia    Immunization History  Administered Date(s) Administered  . Influenza Inj Mdck Quad Pf 04/25/2016  . Influenza Whole 04/20/2013  . Influenza-Unspecified 04/12/2014, 04/20/2015  . PPD Test 11/29/2012   Pertinent  Health Maintenance Due  Topic Date Due  . PNA vac Low Risk Adult (1 of 2 - PCV13) 06/08/1990  . INFLUENZA VACCINE  02/06/2016   No flowsheet data found. Functional Status Survey:  dependent in adls  Vitals:   82/50/03 1846  BP: (!) 178/87  Pulse: 96  Temp: 97.7 F (36.5 C)   There is no height or weight on file to calculate BMI. Physical Exam  Constitutional: He appears well-developed. No distress.  Resting in bed, no acute distress when I saw him later in the day  Neck: Neck supple. No JVD present.  Cardiovascular: Normal rate, regular rhythm, normal heart sounds and intact distal pulses.   Chronic edema of lower extremities  Pulmonary/Chest: Effort normal and breath sounds normal. He has no wheezes. He has no rales.  Upper airway rhonchi   Abdominal: Soft. Bowel sounds are normal.  Neurological:  Was alert, but not verbal today during my visit  Skin: Skin is warm and dry.    Labs reviewed:  Recent Labs  04/19/16 0309  04/27/16 0110 04/27/16 0128 04/27/16 1020 04/28/16 0226  NA 142  < > 141 145  --  142  K 3.6  < > 3.6 3.9  --  2.9*  CL 111  --  115* 115*  --  111  CO2 23  --  20*  --   --  24  GLUCOSE 114*  --  122* 112*  --  105*  BUN 25*  < > 22* 31*  --  19  CREATININE 1.18  < > 1.11 1.10  --  1.28*  CALCIUM 8.4*  --  8.3*  --   --  8.4*  MG  --   --   --   --  2.0  --   < > = values in this  interval not displayed.  Recent Labs  04/19/16 0309 04/27/16 0110 04/28/16 0226  AST 17 14* 17  ALT 13* 10* 10*  ALKPHOS 75 78 81  BILITOT 0.3 0.7 1.2  PROT 6.7 6.3* 6.3*  ALBUMIN 2.7* 2.6* 2.6*    Recent Labs  02/20/16 2355  04/19/16 0309 04/27/16 0110 04/27/16 0128 04/28/16 0226  WBC 10.1  < > 8.6 9.7  --  8.6  NEUTROABS 7.3  --  5.5 6.8  --   --   HGB 13.0  < > 12.4* 11.8* 11.9* 12.0*  HCT 40.5  < > 38.5* 36.8* 35.0* 37.2*  MCV 94.6  --  92.3 92.7  --  91.4  PLT 214  < > 313 332  --  346  < > = values in this interval not displayed. Lab Results  Component Value Date   TSH 3.83 01/29/2016   No results found for: HGBA1C Lab Results  Component Value Date   CHOL 155 08/31/2015   HDL 33 (A) 08/31/2015   LDLCALC 99 08/31/2015   TRIG 116 08/31/2015    Significant Diagnostic Results in last 30 days:  Dg Chest 1 View  Result Date: 04/27/2016 CLINICAL DATA:  Urinary retention.  Cough.  History dementia, CHF. EXAM: CHEST 1 VIEW COMPARISON:  Chest radiograph February 20, 2016 FINDINGS: The cardiac silhouette is mildly enlarged, mediastinal silhouette is nonsuspicious. Calcified aortic knob. Bibasilar strandy densities. Mild pulmonary vascular congestion without pleural effusion or focal consolidation. No pneumothorax. Osteopenia. IMPRESSION: Mild cardiomegaly and pulmonary vascular congestion. Bibasilar atelectasis. Electronically Signed   By: Elon Alas M.D.   On: 04/27/2016 01:58   Dg Abd Portable 1v  Result Date: 04/28/2016 CLINICAL DATA:  Chronic abdominal pain EXAM: PORTABLE ABDOMEN - 1 VIEW COMPARISON:  02/20/2011 FINDINGS: Mild gaseous distended small bowel loops are noted mid abdomen suspicious for ileus, enteritis or early bowel obstruction. Degenerative changes and mild levoscoliosis lumbar spine. Some colonic gas noted in transverse colon. IMPRESSION: Mild gaseous distended small bowel loops mid abdomen suspicious for ileus, enteritis or early bowel  obstruction. Electronically Signed   By: Lahoma Crocker M.D.   On: 04/28/2016 10:31    Assessment/Plan 1. Sepsis secondary to UTI (HCC) Push po fluids x 48 hrs Start cipro 500mg  po bid x 7 days pending culture Notify NP if fevers persist by PM 10/5 with cipro and if intake not improving (may need IVFs) if this level of aggression with care is still to be pursued  2. FTT (failure to thrive) in adult -Ongoing, expect he will have recurrent episodes of poor intake like this that improve short term with fluids  -his family would like his life prolonged with such interventions at this point   3. Chronic kidney disease (CKD), stage IV (severe) (HCC) -cr 1.31 up from 1.1 today -pushing po fluids now that he is awake, but if intake poor, will need IVF   4. Dementia with behavioral disturbance, unspecified dementia type -late stages, minimally verbal, dependent in adls, needs hoyer for transfers and help with even minimal positional changes  Family/ staff Communication: discussed with memory care nurse  Labs/tests ordered:  Recheck cbc, bmp 10/6  Kandyce Dieguez L. Jaymison Luber, D.O. [facility] [address],  [address] Cell Phone (Mon-Fri 8am-5pm):  [phone] On Call:  [phone] & follow prompts after 5pm & weekends Office Phone:  [phone] Office Fax:  [phone]

## 2016-04-12 DIAGNOSIS — R509 Fever, unspecified: Secondary | ICD-10-CM | POA: Diagnosis not present

## 2016-04-12 LAB — CBC AND DIFFERENTIAL
HCT: 40 % — AB (ref 41–53)
HEMATOCRIT: 40 % — AB (ref 41–53)
HEMOGLOBIN: 13.1 g/dL — AB (ref 13.5–17.5)
Hemoglobin: 13.1 g/dL — AB (ref 13.5–17.5)
PLATELETS: 221 10*3/uL (ref 150–399)
Platelets: 221 10*3/uL (ref 150–399)
WBC: 7.5 10*3/mL
WBC: 7.5 10^3/mL

## 2016-04-12 LAB — BASIC METABOLIC PANEL
BUN: 30 mg/dL — AB (ref 4–21)
CREATININE: 1.2 mg/dL (ref 0.6–1.3)
POTASSIUM: 3.8 mmol/L (ref 3.4–5.3)
Sodium: 138 mmol/L (ref 137–147)

## 2016-04-15 ENCOUNTER — Non-Acute Institutional Stay (SKILLED_NURSING_FACILITY): Payer: Medicare Other | Admitting: Adult Health

## 2016-04-15 ENCOUNTER — Encounter: Payer: Self-pay | Admitting: Student

## 2016-04-15 DIAGNOSIS — E860 Dehydration: Secondary | ICD-10-CM | POA: Diagnosis not present

## 2016-04-15 DIAGNOSIS — K5901 Slow transit constipation: Secondary | ICD-10-CM

## 2016-04-15 DIAGNOSIS — N390 Urinary tract infection, site not specified: Secondary | ICD-10-CM

## 2016-04-15 DIAGNOSIS — R140 Abdominal distension (gaseous): Secondary | ICD-10-CM | POA: Diagnosis not present

## 2016-04-15 DIAGNOSIS — T83511D Infection and inflammatory reaction due to indwelling urethral catheter, subsequent encounter: Secondary | ICD-10-CM | POA: Diagnosis not present

## 2016-04-15 DIAGNOSIS — R634 Abnormal weight loss: Secondary | ICD-10-CM | POA: Diagnosis not present

## 2016-04-15 NOTE — Progress Notes (Signed)
Location:   [facility]   Place of Service:    Provider:   Cindi Carbon, ANP [place] [phone]   Hollace Kinnier, DO  Patient Care Team: [name], DO as PCP - General (Geriatric Medicine) Well [place], NP as Nurse Practitioner (Nurse Practitioner)  Extended Emergency Contact Information Primary Emergency Contact: [name] Address: [address]          [place], [name] [address] [place] Phone: [phone] Relation: Spouse Secondary Emergency Contact: [name], [place] Phone: [phone] Relation: Daughter  Code Status:  DNR Goals of care: Advanced Directive information Advanced Directives 03/26/2016  Does patient have an advance directive? -  Type of Advance Directive Out of facility DNR (pink MOST or yellow form);Healthcare Power of Attorney  Does patient want to make changes to advanced directive? -  Copy of advanced directive(s) in chart? Yes  Pre-existing out of facility DNR order (yellow form or pink MOST form) Yellow form placed in chart (order not valid for inpatient use);Pink MOST form placed in chart (order not valid for inpatient use)     Chief Complaint  Patient presents with  . Acute Visit    abdominal distention    HPI:  Pt is a 82 y.o. male who resides in [facility] unit. He is seen today for abdominal distention. His last BM was 2 days ago. On 10/4 he was diagnosed with a UTI. He was started on Cipro for 7 days. Urine culture on 10/6 showed gram positive cocci sensitive to Cipro. He has had an indwelling catheter in place since 8/22 due to BPH. He has a drainage bag to gravity. No obstructions were noted to tubing to drainage back.  His intake for 10/8 was 1390 ml and output 1100 ml. A DRE was performed and he had an immediate episode of watery stool. No fecal masses noted on DRE. Staff reports that this is  his first episode of watery stool.     Past Medical History:  Diagnosis Date  . Allergic rhinitis   . Alzheimer's disease 2013  . Atrial fibrillation ([place])    Long term anti coagulation w/ warfarin fro stroke risk reduction  . BPH (benign prostatic hyperplasia)   . Chronic venous insufficiency 01/12/2013  . Dementia with behavioral disturbance 12/29/13  . Gait disorder   . GERD (gastroesophageal reflux disease)   . History of CVA (cerebrovascular accident) 2001   Rt.MCA  . Hyperlipidemia   . Lactose intolerance   . Long term (current) use of anticoagulants 11/23/2012   Long-term anticoagulation with warfarin for stroke risk reduction related to AF   Past Surgical History:  Procedure Laterality Date  . CATARACT EXTRACTION W/ INTRAOCULAR LENS  IMPLANT, BILATERAL    . HERNIA REPAIR Right 1970s  . KNEE SURGERY Right 2000  . MASS EXCISION Left 07/30/2013   Procedure: EXCISION OF BASAL CELL CARCINOMA  LEFT FOREHEAD ;  Surgeon: Irene Limbo, MD;  Location: [facility];  Service: Plastics;  Laterality: Left;  . RETINAL DETACHMENT SURGERY Right 2009    Allergies  Allergen Reactions  . Penicillins Rash  . Bee Venom   . Risperidone And Related     Unstable gait      Medication List       Accurate as of 04/15/16 10:41 AM. Always use your most recent med list.  ciclopirox 0.77 % cream Commonly known as:  LOPROX Apply topically 2 (two) times daily as needed (redness).   ELIQUIS 2.5 MG Tabs tablet Generic drug:  apixaban Take 2.5 mg by mouth 2 (two) times daily.   furosemide 40 MG tablet Commonly known as:  LASIX Take 40 mg by mouth.   ipratropium-albuterol 0.5-2.5 (3) MG/3ML Soln Commonly known as:  DUONEB Take 3 mLs by nebulization every 6 (six) hours as needed (for wheezing and cough).   lactose free nutrition Liqd Take 237 mLs by mouth 2 (two) times daily between meals.   nitroGLYCERIN 0.4 MG SL tablet Commonly known as:  NITROSTAT Place  0.4 mg under the tongue every 5 (five) minutes as needed for chest pain.   polyethylene glycol packet Commonly known as:  MIRALAX / GLYCOLAX Take 17 g by mouth daily.   potassium chloride SA 20 MEQ tablet Commonly known as:  K-DUR,KLOR-CON Take 40 mEq by mouth daily.   tamsulosin 0.4 MG Caps capsule Commonly known as:  FLOMAX Take 0.4 mg by mouth at bedtime.   zolpidem 5 MG tablet Commonly known as:  AMBIEN Take 2.5 mg by mouth at bedtime as needed for sleep.       Review of Systems  Unable to perform ROS: Dementia    Immunization History  Administered Date(s) Administered  . Influenza Whole 04/20/2013  . Influenza-Unspecified 04/12/2014, 04/20/2015  . PPD Test 11/29/2012   Pertinent  Health Maintenance Due  Topic Date Due  . PNA vac Low Risk Adult (1 of 2 - PCV13) 06/08/1990  . INFLUENZA VACCINE  02/06/2016   No flowsheet data found. Functional Status Survey:    Vitals:   04/15/16 1018  BP: (!) 142/84  Pulse: 72  Temp: 97.5 F (36.4 C)  SpO2: 98%  Weight: 211 lb (95.7 kg)   There is no height or weight on file to calculate BMI.  Wt Readings from Last 3 Encounters:  04/15/16 211 lb (95.7 kg)  03/26/16 208 lb (94.3 kg)  03/01/16 216 lb 1.6 oz (98 kg)   Physical Exam  HENT:  Nose: Nose normal.  Moist mucous membranes  Eyes: Right eye exhibits no discharge. Left eye exhibits no discharge.  Cardiovascular:   BLE edema +3  Abdominal: Bowel sounds are normal. He exhibits distension. He exhibits no mass. There is no tenderness. There is no rebound and no guarding.  Abdomen is firm and rotund. No pain with palpation.   Genitourinary: Rectal exam shows no external hemorrhoid, no internal hemorrhoid and no mass. Prostate is enlarged. Prostate is not tender. Right testis shows no swelling. Left testis shows no swelling.  Genitourinary Comments: DRE performed.   Musculoskeletal: He exhibits no edema.  Neurological: He is alert.  Not able to f/c  . No obvious  focal deficit.   Skin: Skin is warm and dry. No rash noted.  Psychiatric: He has a normal mood and affect.  Nursing note and vitals reviewed.   Labs reviewed:  Recent Labs  02/20/16 2355  03/14/16 0600 03/18/16 0630 04/12/16  NA 139  < > 139 141 138  K 3.4*  < > 4.0 4.2 3.8  CL 112*  --   --   --   --   CO2 22  --   --   --   --   GLUCOSE 115*  --   --   --   --   BUN 22*  < > 20 21 30*  CREATININE 1.51*  < >  1.0 1.2 1.2  CALCIUM 7.8*  --   --   --   --   < > = values in this interval not displayed.  Recent Labs  06/20/15 08/31/15  AST 14 15  ALT 13 12  ALKPHOS 88 99    Recent Labs  02/15/16 02/20/16 2355 04/12/16  WBC 9.7 10.1 7.5  NEUTROABS  --  7.3  --   HGB 16.3 13.0 13.1*  HCT 54* 40.5 40*  MCV  --  94.6  --   PLT 269 214 221   Lab Results  Component Value Date   TSH 3.83 01/29/2016   No results found for: HGBA1C Lab Results  Component Value Date   CHOL 155 08/31/2015   HDL 33 (A) 08/31/2015   LDLCALC 99 08/31/2015   TRIG 116 08/31/2015  Significant Diagnostic Results in last 30 days:   No results found.  Assessment/Plan 1. Abdominal distention -Firm, rotund abdomen - Distention is likely due to constipation. I do not feel that imaging is needed at this point. Will evaluate further if patient has no results from supp.  He is afebrile and eating well without vomiting so there is on evidence of obstruction.  - RN to notify NP if patient develops fever, worsening abdominal distention or diarrhea  2. Slow transit constipation  -Start Ducolax supp x 1 -continue miralax 17 gm daily -RN to notify NP if no results from supp  3. Urinary tract infection associated with indwelling urethral catheter, subsequent encounter -Continue course of Cipro -Staff to cleanse area of catheter insertion with soap and water bid.  -Pt may shower  Cindi Carbon, [facility] [phone]

## 2016-04-17 ENCOUNTER — Encounter: Payer: Self-pay | Admitting: Adult Health

## 2016-04-17 NOTE — Progress Notes (Signed)
Location:   Customer service manager of Service:  SNF (31) Provider:   Cindi Carbon, ANP [place] [phone]   [name], [name], DO  Patient Care Team: [name], DO as PCP - General (Geriatric Medicine) [facility] [place], NP as Nurse Practitioner (Nurse Practitioner)  Extended Emergency Contact Information Primary Emergency Contact: [name] Address: [address]          [place], [place] [address] [place] Phone: [phone] Relation: Spouse Secondary Emergency Contact: [name], [place] Phone: [phone] Relation: Daughter  Code Status:  DNR Goals of care: Advanced Directive information Advanced Directives 03/26/2016  Does patient have an advance directive? -  Type of Advance Directive Out of facility DNR (pink MOST or yellow form);Healthcare Power of Attorney  Does patient want to make changes to advanced directive? -  Copy of advanced directive(s) in chart? Yes  Pre-existing out of facility DNR order (yellow form or pink MOST form) Yellow form placed in chart (order not valid for inpatient use);Pink MOST form placed in chart (order not valid for inpatient use)     Chief Complaint  Patient presents with  . Acute Visit    abd distention    HPI:  Pt is a 82 y.o. male who resides in [facility] unit. He is seen today for abdominal distention. His last BM was 2 days ago. On 10/4 he was diagnosed with a UTI. He was started on Cipro for 7 days. Urine culture on 10/6 showed gram positive cocci sensitive to Cipro. He has had an indwelling catheter in place since 8/22 due to BPH. He has a drainage bag to gravity. No obstructions were noted to tubing to drainage back.  His intake for 10/8 was 1390 ml and output 1100 ml. A DRE was performed and he had an immediate episode of watery stool. No fecal masses noted on DRE. Staff reports that this is  his first episode of watery stool.     Past Medical History:  Diagnosis Date  . Allergic rhinitis   . Alzheimer's disease 2013  . Atrial fibrillation ([place])    Long term anti coagulation w/ warfarin fro stroke risk reduction  . BPH (benign prostatic hyperplasia)   . Chronic venous insufficiency 01/12/2013  . Dementia with behavioral disturbance 12/29/13  . Gait disorder   . GERD (gastroesophageal reflux disease)   . History of CVA (cerebrovascular accident) 2001   Rt.MCA  . Hyperlipidemia   . Lactose intolerance   . Long term (current) use of anticoagulants 11/23/2012   Long-term anticoagulation with warfarin for stroke risk reduction related to AF   Past Surgical History:  Procedure Laterality Date  . CATARACT EXTRACTION W/ INTRAOCULAR LENS  IMPLANT, BILATERAL    . HERNIA REPAIR Right 1970s  . KNEE SURGERY Right 2000  . MASS EXCISION Left 07/30/2013   Procedure: EXCISION OF BASAL CELL CARCINOMA  LEFT FOREHEAD ;  Surgeon: Irene Limbo, MD;  Location: [facility];  Service: Plastics;  Laterality: Left;  . RETINAL DETACHMENT SURGERY Right 2009    Allergies  Allergen Reactions  . Penicillins Rash  . Bee Venom   . Risperidone And Related     Unstable gait      Medication List       Accurate as of 04/15/16 11:59 PM. Always use your most recent med list.  ciclopirox 0.77 % cream Commonly known as:  LOPROX Apply topically 2 (two) times daily as needed (redness).   ELIQUIS 2.5 MG Tabs tablet Generic drug:  apixaban Take 2.5 mg by mouth 2 (two) times daily.   furosemide 40 MG tablet Commonly known as:  LASIX Take 40 mg by mouth.   ipratropium-albuterol 0.5-2.5 (3) MG/3ML Soln Commonly known as:  DUONEB Take 3 mLs by nebulization every 6 (six) hours as needed (for wheezing and cough).   lactose free nutrition Liqd Take 237 mLs by mouth 2 (two) times daily between meals.   nitroGLYCERIN 0.4 MG SL tablet Commonly known as:  NITROSTAT Place  0.4 mg under the tongue every 5 (five) minutes as needed for chest pain.   polyethylene glycol packet Commonly known as:  MIRALAX / GLYCOLAX Take 17 g by mouth daily.   potassium chloride SA 20 MEQ tablet Commonly known as:  K-DUR,KLOR-CON Take 40 mEq by mouth daily.   tamsulosin 0.4 MG Caps capsule Commonly known as:  FLOMAX Take 0.4 mg by mouth at bedtime.   zolpidem 5 MG tablet Commonly known as:  AMBIEN Take 2.5 mg by mouth at bedtime as needed for sleep.       Review of Systems  Unable to perform ROS: Dementia    Immunization History  Administered Date(s) Administered  . Influenza Whole 04/20/2013  . Influenza-Unspecified 04/12/2014, 04/20/2015  . PPD Test 11/29/2012   Pertinent  Health Maintenance Due  Topic Date Due  . PNA vac Low Risk Adult (1 of 2 - PCV13) 06/08/1990  . INFLUENZA VACCINE  02/06/2016   No flowsheet data found. Functional Status Survey:    Vitals:   04/17/16 1537  BP: (!) 142/84  Pulse: 72  Resp: 20  Temp: 97.5 F (36.4 C)  SpO2: 98%   There is no height or weight on file to calculate BMI.  Wt Readings from Last 3 Encounters:  04/15/16 211 lb (95.7 kg)  03/26/16 208 lb (94.3 kg)  03/01/16 216 lb 1.6 oz (98 kg)   Physical Exam  HENT:  Nose: Nose normal.  Moist mucous membranes  Eyes: Right eye exhibits no discharge. Left eye exhibits no discharge.  Cardiovascular:   BLE edema +3  Abdominal: Bowel sounds are normal. He exhibits distension. He exhibits no mass. There is no tenderness. There is no rebound and no guarding.  Abdomen is firm and rotund. No pain with palpation.   Genitourinary: Rectal exam shows no external hemorrhoid, no internal hemorrhoid and no mass. Prostate is enlarged. Prostate is not tender. Right testis shows no swelling. Left testis shows no swelling.  Genitourinary Comments: DRE performed.   Musculoskeletal: He exhibits no edema.  Neurological: He is alert.  Not able to f/c  . No obvious focal deficit.     Skin: Skin is warm and dry. No rash noted.  Psychiatric: He has a normal mood and affect.  Nursing note and vitals reviewed.   Labs reviewed:  Recent Labs  02/20/16 2355  03/14/16 0600 03/18/16 0630 04/12/16  NA 139  < > 139 141 138  K 3.4*  < > 4.0 4.2 3.8  CL 112*  --   --   --   --   CO2 22  --   --   --   --   GLUCOSE 115*  --   --   --   --   BUN 22*  < > 20 21 30*  CREATININE 1.51*  < > 1.0 1.2  1.2  CALCIUM 7.8*  --   --   --   --   < > = values in this interval not displayed.  Recent Labs  06/20/15 08/31/15  AST 14 15  ALT 13 12  ALKPHOS 88 99    Recent Labs  02/15/16 02/20/16 2355 04/12/16  WBC 9.7 10.1 7.5  NEUTROABS  --  7.3  --   HGB 16.3 13.0 13.1*  HCT 54* 40.5 40*  MCV  --  94.6  --   PLT 269 214 221   Lab Results  Component Value Date   TSH 3.83 01/29/2016   No results found for: HGBA1C Lab Results  Component Value Date   CHOL 155 08/31/2015   HDL 33 (A) 08/31/2015   LDLCALC 99 08/31/2015   TRIG 116 08/31/2015  Significant Diagnostic Results in last 30 days:   No results found.  Assessment/Plan 1. Abdominal distention -Firm, rotund abdomen - Distention is likely due to constipation. I do not feel that imaging is needed at this point. Will evaluate further if patient has no results from supp.  He is afebrile and eating well without vomiting so there is on evidence of obstruction.  - RN to notify NP if patient develops fever, worsening abdominal distention or diarrhea  2. Slow transit constipation  -Start Ducolax supp x 1 -continue miralax 17 gm daily -RN to notify NP if no results from supp  3. Urinary tract infection associated with indwelling urethral catheter, subsequent encounter -Continue course of Cipro -Staff to cleanse area of catheter insertion with soap and water bid.  -Pt may shower  Cindi Carbon, [facility] [phone]

## 2016-04-17 NOTE — Progress Notes (Signed)
This encounter was created in error - please disregard.

## 2016-04-19 ENCOUNTER — Emergency Department (HOSPITAL_COMMUNITY)
Admission: EM | Admit: 2016-04-19 | Discharge: 2016-04-19 | Disposition: A | Discharge status: Home / Self Care | Payer: Medicare Other | Attending: Emergency Medicine | Admitting: Emergency Medicine

## 2016-04-19 ENCOUNTER — Encounter (HOSPITAL_COMMUNITY): Payer: Self-pay | Admitting: Emergency Medicine

## 2016-04-19 DIAGNOSIS — Z87891 Personal history of nicotine dependence: Secondary | ICD-10-CM | POA: Diagnosis not present

## 2016-04-19 DIAGNOSIS — Z7901 Long term (current) use of anticoagulants: Secondary | ICD-10-CM | POA: Diagnosis not present

## 2016-04-19 DIAGNOSIS — Z8673 Personal history of transient ischemic attack (TIA), and cerebral infarction without residual deficits: Secondary | ICD-10-CM | POA: Insufficient documentation

## 2016-04-19 DIAGNOSIS — R6889 Other general symptoms and signs: Secondary | ICD-10-CM | POA: Diagnosis not present

## 2016-04-19 DIAGNOSIS — N3001 Acute cystitis with hematuria: Secondary | ICD-10-CM

## 2016-04-19 DIAGNOSIS — G309 Alzheimer's disease, unspecified: Secondary | ICD-10-CM | POA: Diagnosis not present

## 2016-04-19 DIAGNOSIS — R140 Abdominal distension (gaseous): Secondary | ICD-10-CM | POA: Diagnosis not present

## 2016-04-19 DIAGNOSIS — R339 Retention of urine, unspecified: Secondary | ICD-10-CM | POA: Diagnosis not present

## 2016-04-19 DIAGNOSIS — F0281 Dementia in other diseases classified elsewhere with behavioral disturbance: Secondary | ICD-10-CM | POA: Diagnosis not present

## 2016-04-19 LAB — COMPREHENSIVE METABOLIC PANEL
ALBUMIN: 2.7 g/dL — AB (ref 3.5–5.0)
ALK PHOS: 75 U/L (ref 38–126)
ALT: 13 U/L — ABNORMAL LOW (ref 17–63)
ANION GAP: 8 (ref 5–15)
AST: 17 U/L (ref 15–41)
BILIRUBIN TOTAL: 0.3 mg/dL (ref 0.3–1.2)
BUN: 25 mg/dL — ABNORMAL HIGH (ref 6–20)
CALCIUM: 8.4 mg/dL — AB (ref 8.9–10.3)
CO2: 23 mmol/L (ref 22–32)
Chloride: 111 mmol/L (ref 101–111)
Creatinine, Ser: 1.18 mg/dL (ref 0.61–1.24)
GFR, EST NON AFRICAN AMERICAN: 52 mL/min — AB (ref >60)
Glucose, Bld: 114 mg/dL — ABNORMAL HIGH (ref 65–99)
POTASSIUM: 3.6 mmol/L (ref 3.5–5.1)
Sodium: 142 mmol/L (ref 135–145)
TOTAL PROTEIN: 6.7 g/dL (ref 6.5–8.1)

## 2016-04-19 LAB — URINALYSIS, ROUTINE W REFLEX MICROSCOPIC
Bilirubin Urine: NEGATIVE
GLUCOSE, UA: NEGATIVE mg/dL
KETONES UR: NEGATIVE mg/dL
Nitrite: NEGATIVE
PH: 5.5 (ref 5.0–8.0)
Protein, ur: 100 mg/dL — AB
SPECIFIC GRAVITY, URINE: 1.021 (ref 1.005–1.030)

## 2016-04-19 LAB — CBC WITH DIFFERENTIAL/PLATELET
BASOS PCT: 0 %
Basophils Absolute: 0 10*3/uL (ref 0.0–0.1)
Eosinophils Absolute: 0.3 10*3/uL (ref 0.0–0.7)
Eosinophils Relative: 4 %
HEMATOCRIT: 38.5 % — AB (ref 39.0–52.0)
Hemoglobin: 12.4 g/dL — ABNORMAL LOW (ref 13.0–17.0)
LYMPHS ABS: 2.1 10*3/uL (ref 0.7–4.0)
Lymphocytes Relative: 24 %
MCH: 29.7 pg (ref 26.0–34.0)
MCHC: 32.2 g/dL (ref 30.0–36.0)
MCV: 92.3 fL (ref 78.0–100.0)
MONO ABS: 0.7 10*3/uL (ref 0.1–1.0)
MONOS PCT: 8 %
NEUTROS ABS: 5.5 10*3/uL (ref 1.7–7.7)
Neutrophils Relative %: 64 %
Platelets: 313 10*3/uL (ref 150–400)
RBC: 4.17 MIL/uL — ABNORMAL LOW (ref 4.22–5.81)
RDW: 14.2 % (ref 11.5–15.5)
WBC: 8.6 10*3/uL (ref 4.0–10.5)

## 2016-04-19 LAB — URINE MICROSCOPIC-ADD ON

## 2016-04-19 MED ORDER — SODIUM CHLORIDE 0.9 % IV BOLUS (SEPSIS): 500.0000 mL | Freq: Once | INTRAVENOUS | Status: DC

## 2016-04-19 NOTE — ED Triage Notes (Signed)
Per EMS:  Pt here from [facility], presents with urinary retention.  Pt had catheter placed at facility today with only 49ml of output x 8 hours.  Pt also c/o abdominal distention.  Denies pain.  Pt has had normal intake.  Edema noted by EMS in bilateral lower extremities.  Pt possibly hypertensive, but EMS unable to get a successful BP.

## 2016-04-19 NOTE — Discharge Instructions (Signed)
No urinary retention was seen today. Bladder scan was normal. He had increased urinary output in the ED. Labs have been unremarkable. He can follow-up with his primary care provider. I suspect decreased urinary output is secondary to his strict intake restrictions. He did not appear significantly volume overloaded or dehydrated today. Please continue his Cipro as prescribed for his urinary tract infection.

## 2016-04-19 NOTE — ED Provider Notes (Addendum)
By signing my name below, I, Higinio Plan, attest that this documentation has been prepared under the direction and in the presence of [place], DO . Electronically Signed: Higinio Plan, Scribe. 04/19/2016. 2:21 AM.  TIME SEEN: 2:14 AM   CHIEF COMPLAINT: Urinary Retention  LEVEL 5 CAVEAT: HPI and ROS limited due to Dementia   HPI:   Bryan [name] is a 82 y.o. male with history of Alzheimer's dementia, atrial fibrillation on Eliquis, hyperlipidemia brought in by EMS to the Emergency Department for an evaluation of urinary retention that began this morning. Per nephew, pt is a resident of [facility]; he states he was called this evening due to a concern for urinary retention. He reports pt had a catheter placed this morning at his facility but only had a very small amount of output (75 mL) over an eight hour period. Pt's nephew reports hx of difficulty placing a catheter in pt at this same facility. Raiford Noble is the nurses caring for the patient reports that he has had a chronic Foley catheter for the past 3 months. When she noticed he had decreased urinary output today she changed his catheter. There was no increase in the urinary output. She reports that he is on strict in and out. He reportedly had 720 mL total fluid in today with only 75 mL out over the past 8 hours. No known recent fevers, cough, vomiting or diarrhea.  He does have bilateral lower extremity swelling which is chronic for patient. Patient is on Lasix 40 mg once a day. He is also on Flomax at bedtime.  ROS: Level V caveat, Unable to Perform due to Dementia   PAST MEDICAL HISTORY/PAST SURGICAL HISTORY:  Past Medical History:  Diagnosis Date  . Allergic rhinitis   . Alzheimer's disease 2013  . Atrial fibrillation ([place])    Long term anti coagulation w/ warfarin fro stroke risk reduction  . BPH (benign prostatic hyperplasia)   . Chronic venous insufficiency 01/12/2013  . Dementia with behavioral  disturbance 12/29/13  . Gait disorder   . GERD (gastroesophageal reflux disease)   . History of CVA (cerebrovascular accident) 2001   Rt.MCA  . Hyperlipidemia   . Lactose intolerance   . Long term (current) use of anticoagulants 11/23/2012   Long-term anticoagulation with warfarin for stroke risk reduction related to AF    MEDICATIONS:  Prior to Admission medications   Medication Sig Start Date End Date Taking? Authorizing Provider  apixaban (ELIQUIS) 2.5 MG TABS tablet Take 2.5 mg by mouth 2 (two) times daily.    Historical Provider, MD  ciclopirox (LOPROX) 0.77 % cream Apply topically 2 (two) times daily as needed (redness).    Historical Provider, MD  furosemide (LASIX) 40 MG tablet Take 40 mg by mouth.    Historical Provider, MD  ipratropium-albuterol (DUONEB) 0.5-2.5 (3) MG/3ML SOLN Take 3 mLs by nebulization every 6 (six) hours as needed (for wheezing and cough).     Historical Provider, MD  lactose free nutrition (BOOST PLUS) LIQD Take 237 mLs by mouth 2 (two) times daily between meals.    Historical Provider, MD  nitroGLYCERIN (NITROSTAT) 0.4 MG SL tablet Place 0.4 mg under the tongue every 5 (five) minutes as needed for chest pain.    Historical Provider, MD  polyethylene glycol (MIRALAX / GLYCOLAX) packet Take 17 g by mouth daily.    Historical Provider, MD  potassium chloride SA (K-DUR,KLOR-CON) 20 MEQ tablet Take 40 mEq by mouth daily.  Historical Provider, MD  tamsulosin (FLOMAX) 0.4 MG CAPS capsule Take 0.4 mg by mouth at bedtime.    Historical Provider, MD  zolpidem (AMBIEN) 5 MG tablet Take 2.5 mg by mouth at bedtime as needed for sleep.    Historical Provider, MD    ALLERGIES:  Allergies  Allergen Reactions  . Penicillins Rash  . Bee Venom   . Risperidone And Related     Unstable gait    SOCIAL HISTORY:  Social History  Substance Use Topics  . Smoking status: Former Smoker    Quit date: 07/24/1983  . Smokeless tobacco: Former Systems developer    Quit date: 11/18/1970  .  Alcohol use Yes     Comment: occasionally    FAMILY HISTORY: History reviewed. No pertinent family history.  EXAM: BP 124/78 (BP Location: Left Arm)   Pulse 78   Temp 97.8 F (36.6 C) (Oral)   Resp 18   SpO2 99%  CONSTITUTIONAL: Alert and oriented and responds appropriately to questions. Elderly, chronically ill-appearing, in no distress HEAD: Normocephalic EYES: Conjunctivae clear, PERRL ENT: normal nose; no rhinorrhea; moist mucous membranes NECK: Supple, no meningismus, no LAD  CARD: RRR; S1 and S2 appreciated; no murmurs, no clicks, no rubs, no gallops RESP: Normal chest excursion without splinting or tachypnea; breath sounds clear and equal bilaterally; no wheezes, no rhonchi, no rales, no hypoxia or respiratory distress, speaking full sentences ABD/GI: Normal bowel sounds; non-distended; soft, non-tender, no rebound, no guarding, no peritoneal signs BACK:  The back appears normal and is non-tender to palpation, there is no CVA tenderness EXT: Normal ROM in all joints; non-tender to palpation; plus pitting edema to the distal bilateral lower extremities and feet without erythema or warmth; normal capillary refill; no cyanosis, no calf tenderness or swelling    SKIN: Normal color for age and race; warm; no rash NEURO: Moves all extremities equally, sensation to light touch intact diffusely, cranial nerves II through XII intact PSYCH: The patient's mood and manner are appropriate. Grooming and personal hygiene are appropriate.  MEDICAL DECISION MAKING: Patient here with decreased urinary output. Bladder scan does not reveal a distended bladder. He does not appear to have abdominal tenderness on exam. He does not appear significantly dehydrated or volume overloaded however. Will obtain labs and urinalysis. He does appear he was recently treated for a urinary tract infection that grew enterococcus that was pansensitive except to tetracycline. He was started on Cipro on October 9.  ED  PROGRESS: Patient has had increased urinary output in the emergency department. His bladder scan 2 has shown less than 50 mL. His abdomen does not appear distended to me and he does not appear uncomfortable at all. His nephew is at bedside states that he appears to be at his baseline. His labs today are unremarkable. Creatinine appears to be normal. Urine does show blood but he is currently being treated for a urinary tract infection. Culture is pending. I feel he is safe to be discharged back to his nursing facility and his nephew agrees. I do not feel he needs IV Lasix or IV fluids as again he does not appear to be significantly dehydrated or volume overloaded. Nephew agrees.  At this time, I do not feel there is any life-threatening condition present. I have reviewed and discussed all results (EKG, imaging, lab, urine as appropriate), exam findings with patient/family. I have reviewed nursing notes and appropriate previous records.  I feel the patient is safe to be discharged home without further emergent  workup and can continue workup as an outpatient as needed. Discussed usual and customary return precautions. Patient/family verbalize understanding and are comfortable with this plan.  Outpatient follow-up has been provided. All questions have been answered.   I personally performed the services described in this documentation, which was scribed in my presence. The recorded information has been reviewed and is accurate.     [place], DO 04/19/16 0427    No gross hematuria coming from Foley catheter. No signs of any clots. Catheter was flushed easily. It was recently changed tonight at his nursing facility.   [place], DO 04/19/16 [phone]

## 2016-04-20 LAB — URINE CULTURE: Culture: <10000 — AB

## 2016-04-22 ENCOUNTER — Non-Acute Institutional Stay (SKILLED_NURSING_FACILITY): Payer: Medicare Other | Admitting: Adult Health

## 2016-04-22 DIAGNOSIS — I5032 Chronic diastolic (congestive) heart failure: Secondary | ICD-10-CM

## 2016-04-22 DIAGNOSIS — E860 Dehydration: Secondary | ICD-10-CM | POA: Diagnosis not present

## 2016-04-22 DIAGNOSIS — I482 Chronic atrial fibrillation, unspecified: Secondary | ICD-10-CM

## 2016-04-22 DIAGNOSIS — N401 Enlarged prostate with lower urinary tract symptoms: Secondary | ICD-10-CM

## 2016-04-22 DIAGNOSIS — R338 Other retention of urine: Secondary | ICD-10-CM

## 2016-04-22 DIAGNOSIS — N183 Chronic kidney disease, stage 3 unspecified: Secondary | ICD-10-CM

## 2016-04-22 DIAGNOSIS — F0391 Unspecified dementia with behavioral disturbance: Secondary | ICD-10-CM

## 2016-04-22 DIAGNOSIS — K5900 Constipation, unspecified: Secondary | ICD-10-CM

## 2016-04-22 DIAGNOSIS — R34 Anuria and oliguria: Secondary | ICD-10-CM | POA: Diagnosis not present

## 2016-04-22 LAB — BASIC METABOLIC PANEL
BUN: 23 mg/dL — AB (ref 4–21)
CREATININE: 1 mg/dL (ref 0.6–1.3)
Glucose: 101 mg/dL
POTASSIUM: 3.7 mmol/L (ref 3.4–5.3)
SODIUM: 143 mmol/L (ref 137–147)

## 2016-04-22 NOTE — Progress Notes (Signed)
Patient ID: Bryan [name], male   DOB: [birth year], 82 y.o.   MRN: [id number]   Location:  [facility]  Patient Care Team: [name], DO as PCP - General (Geriatric Medicine) [facility] [place], NP as Nurse Practitioner (Nurse Practitioner)  Code Status:  DNR  CC: Medical management of chronic diseases  HPI:  82 y.o. male  residing at [facility], memory care section. He is seen today for management of chronic diseases.   1. Low urine output  He was sent to the ED on 10/13 for low urine output with no new orders. Urine output has since improved. Output 275ml today with Lasix held. He has good oral intake per sitter.  2. Chronic diastolic congestive heart failure ([place]) He is taking Lasix for fluid retention. He is taking K-Dur. His Lasix has been held starting on 04/21/16 due to decreased urinary output. On 10/15 his input was 820 and output 465. Staff reports he is eating and drinking well. Calf circumference for 10/16 was 14 (right) and 13.5 (left). Pt's weights could not be evaluated due to erroneous readings.  His last ECHO was 04/2014 with results "The estimated ejection fraction was in the range of 50% to 55%. Although no diagnostic regional wall motion abnormality was identified, this possibility cannot be completely excluded on the basis of this study"  3. Chronic atrial fibrillation (HCC) His rate is controlled. HR today 83. He is on Eliquis for anticoagulation.   4. Dementia with behavioral disturbance, unspecified dementia type He is not taking any memory medications. Last MMSE in 2015. He is not functionally able to perform MMSE at this point.   5. CKD (chronic kidney disease) stage 3, GFR 30-59 ml/min Last BUN 23.4, Creat 0.99 on 10/16. His BUN is trending down since 10/6. His urine output is diminished, but has improved over the past 3 days. He has good oral intake per staff.   6. Benign  prostatic hyperplasia with urinary retention He is on Flomax for insertion of catheter. He has an indwelling urinary catheter with drainage bag to gravity.   7. Constipation, unspecified constipation type He takes Miralax daily. Last week he was constipated and good results from glycerin suppository.     Review of Systems:  Review of Systems  Unable to perform ROS: dementia      Medications: Patient's Medications  New Prescriptions   No medications on file  Previous Medications   APIXABAN (ELIQUIS) 2.5 MG TABS TABLET    Take 2.5 mg by mouth 2 (two) times daily.   CICLOPIROX (LOPROX) 0.77 % CREAM    Apply topically 2 (two) times daily as needed (redness).   FUROSEMIDE (LASIX) 40 MG TABLET    Take 40 mg by mouth.   IPRATROPIUM-ALBUTEROL (DUONEB) 0.5-2.5 (3) MG/3ML SOLN    Take 3 mLs by nebulization every 6 (six) hours as needed (for wheezing and cough).    LACTOSE FREE NUTRITION (BOOST PLUS) LIQD    Take 237 mLs by mouth 2 (two) times daily between meals.   NITROGLYCERIN (NITROSTAT) 0.4 MG SL TABLET    Place 0.4 mg under the tongue every 5 (five) minutes as needed for chest pain.   POLYETHYLENE GLYCOL (MIRALAX / GLYCOLAX) PACKET    Take 17 g by mouth daily.   POTASSIUM CHLORIDE SA (K-DUR,KLOR-CON) 20 MEQ TABLET    Take 40 mEq by mouth daily.   TAMSULOSIN (FLOMAX) 0.4 MG CAPS CAPSULE    Take 0.4  mg by mouth at bedtime. Hold for SBP < 95   ZOLPIDEM (AMBIEN) 5 MG TABLET    Take 2.5 mg by mouth at bedtime as needed for sleep.  Modified Medications   No medications on file  Discontinued Medications   No medications on file    Physical Exam: Vitals:   04/22/16 1328  BP: 112/70  Pulse: 83  Temp: 97.2 F (36.2 C)  SpO2: 92%    Wt Readings from Last 3 Encounters:  04/15/16 211 lb (95.7 kg)  03/26/16 208 lb (94.3 kg)  03/01/16 216 lb 1.6 oz (98 kg)    Physical Exam  Constitutional: No distress.  HENT:  Head: Normocephalic and atraumatic.  Eyes: Pupils are equal, round, and  reactive to light.  Neck: Neck supple. No JVD present.  Cardiovascular: Normal rate, normal heart sounds and intact distal pulses.   No murmur heard. Irregular. 4+ edema BLE L>R. Wears compression stockings  Pulmonary/Chest: Effort normal and breath sounds normal. No respiratory distress. He has no wheezes.  Abdominal: Soft. Bowel sounds are normal. He exhibits no distension. There is no tenderness. There is no guarding.  Genitourinary:  Genitourinary Comments: Indwelling urinary catheter  Musculoskeletal: Edema: right knee, no warmth, no redness.  Lymphadenopathy:    He has no cervical adenopathy.  Neurological: He is alert.  Alert, non verbal, not able to f/c  Skin: Skin is warm and dry. He is not diaphoretic.  Psychiatric: Affect normal.  Nursing note and vitals reviewed.    Labs/Studies reviewed:  CXR 09/01/14 reviewed IMPRESSION: Shallow inspiration with atelectasis in the lung bases. Chronic bronchitic changes. Mild cardiac enlargement. No evidence of active Consolidation.  2D echo 06/2015 Study Conclusions  - Left ventricle: The cavity size was normal. There was mild focal basal hypertrophy of the septum. Systolic function was normal. The estimated ejection fraction was in the range of 50% to 55%. Although no diagnostic regional wall motion abnormality was identified, this possibility cannot be completely excluded on the basis of this study. - Aortic valve: Cusp separation was reduced. - Mitral valve: Calcified annulus. Mildly thickened leaflets . - Left atrium: The atrium was mildly dilated.  Basic Metabolic Panel:  Recent Labs  02/20/16 2355  04/12/16 04/19/16 0309 04/22/16  NA 139  < > 138 142 143  K 3.4*  < > 3.8 3.6 3.7  CL 112*  --   --  111  --   CO2 22  --   --  23  --   GLUCOSE 115*  --   --  114*  --   BUN 22*  < > 30* 25* 23*  CREATININE 1.51*  < > 1.2 1.18 1.0  CALCIUM 7.8*  --   --  8.4*  --   < > = values in this interval not  displayed.  CBC:  Recent Labs  02/20/16 2355 04/12/16 04/19/16 0309  WBC 10.1 7.5  7.5 8.6  NEUTROABS 7.3  --  5.5  HGB 13.0 13.1*  13.1* 12.4*  HCT 40.5 40*  40* 38.5*  MCV 94.6  --  92.3  PLT 214 221  221 313    Wt Readings from Last 3 Encounters:  04/15/16 211 lb (95.7 kg)  03/26/16 208 lb (94.3 kg)  03/01/16 216 lb 1.6 oz (98 kg)   Lab Results  Component Value Date   CHOL 155 08/31/2015   HDL 33 (A) 08/31/2015   LDLCALC 99 08/31/2015   TRIG 116 08/31/2015     03/09/15:  ferritin 85, TIBC 199, %sat 35, total iron 105 09/26/15: Depakote <12.5  Assessment/Plan  1. Low urine output -Improved, most likely due to poor intake with recent UTI while receiving lasix -Continue to hold Lasix -Measure I/Os daily -Measure calf circumference daily  2. Chronic diastolic congestive heart failure (HCC) -does not appear to be in fluid overload -Decease K-Dur to 23meq due to holding Lasix. Last K+ 3.7 on 10/16.  3. Chronic atrial fibrillation (HCC) -rate controlled -H/H stable -Continue Eliquis for anticoagulation  4. Dementia with behavioral disturbance, unspecified dementia type -Late stage with weight loss -No longer on meds -Continue supportive care  5. Benign prostatic hyperplasia with urinary retention - Indwelling catheter in place -Continue flomax  6. Constipation, unspecified constipation type - Improved -Continue Miralax  7. CKD (chronic kidney disease) stage 3, GFR 30-59 ml/min -Improved BUN. Stable creatinine. Will continue to monitor.      Cindi [place], ANP [facility] [phone]

## 2016-04-25 DIAGNOSIS — Z23 Encounter for immunization: Secondary | ICD-10-CM | POA: Diagnosis not present

## 2016-04-26 DIAGNOSIS — R339 Retention of urine, unspecified: Secondary | ICD-10-CM | POA: Diagnosis not present

## 2016-04-26 DIAGNOSIS — R031 Nonspecific low blood-pressure reading: Secondary | ICD-10-CM | POA: Diagnosis not present

## 2016-04-27 ENCOUNTER — Emergency Department (HOSPITAL_COMMUNITY): Payer: Medicare Other

## 2016-04-27 ENCOUNTER — Inpatient Hospital Stay (HOSPITAL_COMMUNITY)
Admission: EM | Admit: 2016-04-27 | Discharge: 2016-04-28 | DRG: 292 | Disposition: A | Discharge status: Home / Self Care | Payer: Medicare Other | Attending: Internal Medicine | Admitting: Internal Medicine

## 2016-04-27 ENCOUNTER — Encounter (HOSPITAL_COMMUNITY): Payer: Self-pay | Admitting: Emergency Medicine

## 2016-04-27 DIAGNOSIS — R338 Other retention of urine: Secondary | ICD-10-CM | POA: Diagnosis present

## 2016-04-27 DIAGNOSIS — Z79899 Other long term (current) drug therapy: Secondary | ICD-10-CM

## 2016-04-27 DIAGNOSIS — R601 Generalized edema: Secondary | ICD-10-CM

## 2016-04-27 DIAGNOSIS — G300 Alzheimer's disease with early onset: Secondary | ICD-10-CM | POA: Diagnosis not present

## 2016-04-27 DIAGNOSIS — G308 Other Alzheimer's disease: Secondary | ICD-10-CM | POA: Diagnosis not present

## 2016-04-27 DIAGNOSIS — R059 Cough, unspecified: Secondary | ICD-10-CM

## 2016-04-27 DIAGNOSIS — E785 Hyperlipidemia, unspecified: Secondary | ICD-10-CM | POA: Diagnosis present

## 2016-04-27 DIAGNOSIS — Z8673 Personal history of transient ischemic attack (TIA), and cerebral infarction without residual deficits: Secondary | ICD-10-CM | POA: Diagnosis not present

## 2016-04-27 DIAGNOSIS — N183 Chronic kidney disease, stage 3 (moderate): Secondary | ICD-10-CM | POA: Diagnosis present

## 2016-04-27 DIAGNOSIS — Z7901 Long term (current) use of anticoagulants: Secondary | ICD-10-CM

## 2016-04-27 DIAGNOSIS — E739 Lactose intolerance, unspecified: Secondary | ICD-10-CM | POA: Diagnosis present

## 2016-04-27 DIAGNOSIS — N3001 Acute cystitis with hematuria: Secondary | ICD-10-CM

## 2016-04-27 DIAGNOSIS — Z66 Do not resuscitate: Secondary | ICD-10-CM | POA: Diagnosis present

## 2016-04-27 DIAGNOSIS — I482 Chronic atrial fibrillation: Secondary | ICD-10-CM | POA: Diagnosis present

## 2016-04-27 DIAGNOSIS — N189 Chronic kidney disease, unspecified: Secondary | ICD-10-CM | POA: Diagnosis not present

## 2016-04-27 DIAGNOSIS — T83511A Infection and inflammatory reaction due to indwelling urethral catheter, initial encounter: Secondary | ICD-10-CM | POA: Diagnosis not present

## 2016-04-27 DIAGNOSIS — R4182 Altered mental status, unspecified: Secondary | ICD-10-CM | POA: Diagnosis not present

## 2016-04-27 DIAGNOSIS — Z87891 Personal history of nicotine dependence: Secondary | ICD-10-CM | POA: Diagnosis not present

## 2016-04-27 DIAGNOSIS — I5033 Acute on chronic diastolic (congestive) heart failure: Principal | ICD-10-CM | POA: Diagnosis present

## 2016-04-27 DIAGNOSIS — F0281 Dementia in other diseases classified elsewhere with behavioral disturbance: Secondary | ICD-10-CM | POA: Diagnosis not present

## 2016-04-27 DIAGNOSIS — N401 Enlarged prostate with lower urinary tract symptoms: Secondary | ICD-10-CM | POA: Diagnosis present

## 2016-04-27 DIAGNOSIS — K219 Gastro-esophageal reflux disease without esophagitis: Secondary | ICD-10-CM | POA: Diagnosis present

## 2016-04-27 DIAGNOSIS — K5900 Constipation, unspecified: Secondary | ICD-10-CM | POA: Diagnosis present

## 2016-04-27 DIAGNOSIS — J810 Acute pulmonary edema: Secondary | ICD-10-CM | POA: Diagnosis not present

## 2016-04-27 DIAGNOSIS — F0280 Dementia in other diseases classified elsewhere without behavioral disturbance: Secondary | ICD-10-CM | POA: Diagnosis present

## 2016-04-27 DIAGNOSIS — G8929 Other chronic pain: Secondary | ICD-10-CM

## 2016-04-27 DIAGNOSIS — N390 Urinary tract infection, site not specified: Secondary | ICD-10-CM | POA: Diagnosis present

## 2016-04-27 DIAGNOSIS — R05 Cough: Secondary | ICD-10-CM | POA: Diagnosis not present

## 2016-04-27 DIAGNOSIS — R109 Unspecified abdominal pain: Secondary | ICD-10-CM

## 2016-04-27 DIAGNOSIS — G309 Alzheimer's disease, unspecified: Secondary | ICD-10-CM | POA: Diagnosis present

## 2016-04-27 DIAGNOSIS — I509 Heart failure, unspecified: Secondary | ICD-10-CM | POA: Diagnosis not present

## 2016-04-27 LAB — CBC WITH DIFFERENTIAL/PLATELET
BASOS PCT: 0 %
Basophils Absolute: 0 10*3/uL (ref 0.0–0.1)
EOS ABS: 0.2 10*3/uL (ref 0.0–0.7)
Eosinophils Relative: 2 %
HCT: 36.8 % — ABNORMAL LOW (ref 39.0–52.0)
HEMOGLOBIN: 11.8 g/dL — AB (ref 13.0–17.0)
Lymphocytes Relative: 20 %
Lymphs Abs: 1.9 10*3/uL (ref 0.7–4.0)
MCH: 29.7 pg (ref 26.0–34.0)
MCHC: 32.1 g/dL (ref 30.0–36.0)
MCV: 92.7 fL (ref 78.0–100.0)
Monocytes Absolute: 0.7 10*3/uL (ref 0.1–1.0)
Monocytes Relative: 8 %
NEUTROS PCT: 70 %
Neutro Abs: 6.8 10*3/uL (ref 1.7–7.7)
Platelets: 332 10*3/uL (ref 150–400)
RBC: 3.97 MIL/uL — AB (ref 4.22–5.81)
RDW: 14.5 % (ref 11.5–15.5)
WBC: 9.7 10*3/uL (ref 4.0–10.5)

## 2016-04-27 LAB — COMPREHENSIVE METABOLIC PANEL
ALBUMIN: 2.6 g/dL — AB (ref 3.5–5.0)
ALK PHOS: 78 U/L (ref 38–126)
ALT: 10 U/L — AB (ref 17–63)
ANION GAP: 6 (ref 5–15)
AST: 14 U/L — ABNORMAL LOW (ref 15–41)
BUN: 22 mg/dL — ABNORMAL HIGH (ref 6–20)
CALCIUM: 8.3 mg/dL — AB (ref 8.9–10.3)
CO2: 20 mmol/L — AB (ref 22–32)
Chloride: 115 mmol/L — ABNORMAL HIGH (ref 101–111)
Creatinine, Ser: 1.11 mg/dL (ref 0.61–1.24)
GFR calc Af Amer: >60 mL/min (ref >60)
GFR calc non Af Amer: 56 mL/min — ABNORMAL LOW (ref >60)
GLUCOSE: 122 mg/dL — AB (ref 65–99)
Potassium: 3.6 mmol/L (ref 3.5–5.1)
SODIUM: 141 mmol/L (ref 135–145)
Total Bilirubin: 0.7 mg/dL (ref 0.3–1.2)
Total Protein: 6.3 g/dL — ABNORMAL LOW (ref 6.5–8.1)

## 2016-04-27 LAB — URINALYSIS, ROUTINE W REFLEX MICROSCOPIC
BILIRUBIN URINE: NEGATIVE
Glucose, UA: NEGATIVE mg/dL
Ketones, ur: NEGATIVE mg/dL
NITRITE: NEGATIVE
PH: 6 (ref 5.0–8.0)
Protein, ur: 100 mg/dL — AB
SPECIFIC GRAVITY, URINE: 1.025 (ref 1.005–1.030)

## 2016-04-27 LAB — URINE MICROSCOPIC-ADD ON

## 2016-04-27 LAB — I-STAT CG4 LACTIC ACID, ED
LACTIC ACID, VENOUS: 1.66 mmol/L (ref 0.5–1.9)
LACTIC ACID, VENOUS: 0.84 mmol/L (ref 0.5–1.9)

## 2016-04-27 LAB — I-STAT CHEM 8, ED
BUN: 31 mg/dL — ABNORMAL HIGH (ref 6–20)
CALCIUM ION: 1.05 mmol/L — AB (ref 1.15–1.40)
CHLORIDE: 115 mmol/L — AB (ref 101–111)
Creatinine, Ser: 1.1 mg/dL (ref 0.61–1.24)
GLUCOSE: 112 mg/dL — AB (ref 65–99)
HCT: 35 % — ABNORMAL LOW (ref 39.0–52.0)
Hemoglobin: 11.9 g/dL — ABNORMAL LOW (ref 13.0–17.0)
Potassium: 3.9 mmol/L (ref 3.5–5.1)
Sodium: 145 mmol/L (ref 135–145)
TCO2: 24 mmol/L (ref 0–100)

## 2016-04-27 LAB — BRAIN NATRIURETIC PEPTIDE: B Natriuretic Peptide: 113.3 pg/mL — ABNORMAL HIGH (ref 0.0–100.0)

## 2016-04-27 LAB — I-STAT TROPONIN, ED: Troponin i, poc: 0 ng/mL (ref 0.00–0.08)

## 2016-04-27 LAB — MAGNESIUM: MAGNESIUM: 2 mg/dL (ref 1.7–2.4)

## 2016-04-27 LAB — MRSA PCR SCREENING: MRSA by PCR: NEGATIVE

## 2016-04-27 MED ORDER — CICLOPIROX OLAMINE 0.77 % EX CREA: TOPICAL_CREAM | Freq: Two times a day (BID) | CUTANEOUS | Status: DC | PRN

## 2016-04-27 MED ORDER — POLYETHYLENE GLYCOL 3350 17 G PO PACK
17.0000 g | PACK | Freq: Every day | ORAL | Status: DC
  Administered 2016-04-27: 17 g via ORAL
  Filled 2016-04-27: qty 1

## 2016-04-27 MED ORDER — IPRATROPIUM-ALBUTEROL 0.5-2.5 (3) MG/3ML IN SOLN
3.0000 mL | Freq: Four times a day (QID) | RESPIRATORY_TRACT | Status: DC | PRN
  Administered 2016-04-27: 3 mL via RESPIRATORY_TRACT
  Filled 2016-04-27: qty 3

## 2016-04-27 MED ORDER — APIXABAN 2.5 MG PO TABS: 2.5000 mg | ORAL_TABLET | Freq: Two times a day (BID) | ORAL | Status: DC

## 2016-04-27 MED ORDER — FUROSEMIDE 10 MG/ML IJ SOLN
40.0000 mg | Freq: Two times a day (BID) | INTRAMUSCULAR | Status: AC
  Administered 2016-04-27 – 2016-04-28 (×2): 40 mg via INTRAVENOUS
  Filled 2016-04-27 (×3): qty 4

## 2016-04-27 MED ORDER — POTASSIUM CHLORIDE CRYS ER 20 MEQ PO TBCR: 40.0000 meq | EXTENDED_RELEASE_TABLET | Freq: Every day | ORAL | Status: DC

## 2016-04-27 MED ORDER — RESOURCE THICKENUP CLEAR PO POWD
ORAL | Status: DC | PRN
  Filled 2016-04-27: qty 125

## 2016-04-27 MED ORDER — FUROSEMIDE 10 MG/ML IJ SOLN
40.0000 mg | Freq: Two times a day (BID) | INTRAMUSCULAR | Status: DC
Start: 2016-04-27 — End: 2016-04-27
  Administered 2016-04-27: 40 mg via INTRAVENOUS

## 2016-04-27 MED ORDER — ONDANSETRON HCL 4 MG/2ML IJ SOLN: 4.0000 mg | Freq: Four times a day (QID) | INTRAMUSCULAR | Status: DC | PRN

## 2016-04-27 MED ORDER — POLYETHYLENE GLYCOL 3350 17 G PO PACK: 17.0000 g | PACK | Freq: Every day | ORAL | Status: DC

## 2016-04-27 MED ORDER — APIXABAN 5 MG PO TABS
5.0000 mg | ORAL_TABLET | Freq: Two times a day (BID) | ORAL | Status: DC
  Administered 2016-04-27 – 2016-04-28 (×2): 5 mg via ORAL
  Filled 2016-04-27 (×2): qty 1

## 2016-04-27 MED ORDER — ZOLPIDEM TARTRATE 5 MG PO TABS: 2.5000 mg | ORAL_TABLET | Freq: Every evening | ORAL | Status: DC | PRN

## 2016-04-27 MED ORDER — DEXTROSE 5 % IV SOLN
1.0000 g | Freq: Once | INTRAVENOUS | Status: AC
  Administered 2016-04-27: 1 g via INTRAVENOUS
  Filled 2016-04-27: qty 10

## 2016-04-27 MED ORDER — SODIUM CHLORIDE 0.9 % IV SOLN: 250.0000 mL | INTRAVENOUS | Status: DC | PRN

## 2016-04-27 MED ORDER — DEXTROSE 5 % IV SOLN
1.0000 g | INTRAVENOUS | Status: DC
  Administered 2016-04-28: 1 g via INTRAVENOUS
  Filled 2016-04-27: qty 10

## 2016-04-27 MED ORDER — ACETAMINOPHEN 325 MG PO TABS: 650.0000 mg | ORAL_TABLET | ORAL | Status: DC | PRN

## 2016-04-27 MED ORDER — TAMSULOSIN HCL 0.4 MG PO CAPS
0.4000 mg | ORAL_CAPSULE | Freq: Every day | ORAL | Status: DC
  Administered 2016-04-27: 0.4 mg via ORAL
  Filled 2016-04-27: qty 1

## 2016-04-27 MED ORDER — BOOST PLUS PO LIQD
237.0000 mL | Freq: Two times a day (BID) | ORAL | Status: DC
  Administered 2016-04-27: 237 mL via ORAL
  Filled 2016-04-27 (×5): qty 237

## 2016-04-27 MED ORDER — FUROSEMIDE 10 MG/ML IJ SOLN
40.0000 mg | Freq: Once | INTRAMUSCULAR | Status: AC
  Administered 2016-04-27: 40 mg via INTRAVENOUS
  Filled 2016-04-27: qty 4

## 2016-04-27 MED ORDER — SODIUM CHLORIDE 0.9% FLUSH
3.0000 mL | Freq: Two times a day (BID) | INTRAVENOUS | Status: DC
  Administered 2016-04-27 – 2016-04-28 (×4): 3 mL via INTRAVENOUS

## 2016-04-27 MED ORDER — FUROSEMIDE 20 MG PO TABS: 40.0000 mg | ORAL_TABLET | Freq: Every day | ORAL | Status: DC

## 2016-04-27 MED ORDER — SODIUM CHLORIDE 0.9% FLUSH: 3.0000 mL | INTRAVENOUS | Status: DC | PRN

## 2016-04-27 NOTE — ED Provider Notes (Signed)
[facility] DEPT Provider Note   CSN: [id number] Arrival date & time: 04/27/16  7893 By signing my name below, I, Georgette Shell, attest that this documentation has been prepared under the direction and in the presence of Duffy Bruce, MD. Electronically Signed: Georgette Shell, ED Scribe. 04/27/16. 1:19 AM.  History   Chief Complaint Chief Complaint  Patient presents with  . Urinary Retention   LEVEL 5 CAVEAT: HPI and ROS limited due to Dementia.  HPI Comments: Bryan [name] is a 82 y.o. male with h/o dementia, CKD (stage 3), and A-fib on Eliquis, who presents to the Emergency Department by EMS for an evaluation of urinary retention that began one day ago. Pt is a resident of [facility]. Per facility, pt was given 750cc fluid one day ago and had his catheter flushed and changed but reports only 50cc of urine today. He was recently treated with Cipro (beginning on 10/9) for a UTI. Pt was also here one week ago for the same symptoms. No known fevers, vomiting, or any other associated symptoms at this time.  The history is provided by the patient, the nursing home and a relative. No language interpreter was used.    Past Medical History:  Diagnosis Date  . Allergic rhinitis   . Alzheimer's disease 2013  . Atrial fibrillation ([place])    Long term anti coagulation w/ warfarin fro stroke risk reduction  . BPH (benign prostatic hyperplasia)   . Chronic venous insufficiency 01/12/2013  . Dementia with behavioral disturbance 12/29/13  . Gait disorder   . GERD (gastroesophageal reflux disease)   . History of CVA (cerebrovascular accident) 2001   Rt.MCA  . Hyperlipidemia   . Lactose intolerance   . Long term (current) use of anticoagulants 11/23/2012   Long-term anticoagulation with warfarin for stroke risk reduction related to AF    Patient Active Problem List   Diagnosis Date Noted  . Diastolic CHF ([place]) 81/07/7508  . Rigidity 06/23/2015  . Osteoarthritis of right knee  05/19/2015  . Dysphagia, pharyngoesophageal phase 09/05/2014  . Protein-calorie malnutrition ([place]) 08/09/2014  . Hyperlipidemia 06/03/2014  . CKD (chronic kidney disease) stage 3, GFR 30-59 ml/min 06/03/2014  . Dementia with behavioral disturbance 12/29/2013  . Skin lesion of face 03/30/2013  . Osteoarthritis of spine 03/10/2013  . Chronic venous insufficiency 01/12/2013  . Edema of both legs 01/12/2013  . Long term current use of anticoagulant therapy 11/23/2012  . Alzheimer's disease   . Atrial fibrillation ([place])   . BPH (benign prostatic hyperplasia)   . Gait disorder     Past Surgical History:  Procedure Laterality Date  . CATARACT EXTRACTION W/ INTRAOCULAR LENS  IMPLANT, BILATERAL    . HERNIA REPAIR Right 1970s  . KNEE SURGERY Right 2000  . MASS EXCISION Left 07/30/2013   Procedure: EXCISION OF BASAL CELL CARCINOMA  LEFT FOREHEAD ;  Surgeon: Irene Limbo, MD;  Location: [facility];  Service: Plastics;  Laterality: Left;  . RETINAL DETACHMENT SURGERY Right 2009       Home Medications    Prior to Admission medications   Medication Sig Start Date End Date Taking? Authorizing Provider  apixaban (ELIQUIS) 2.5 MG TABS tablet Take 2.5 mg by mouth 2 (two) times daily.    Historical Provider, MD  ciclopirox (LOPROX) 0.77 % cream Apply topically 2 (two) times daily as needed (redness).    Historical Provider, MD  furosemide (LASIX) 40 MG tablet Take 40 mg by mouth.    Historical  Provider, MD  ipratropium-albuterol (DUONEB) 0.5-2.5 (3) MG/3ML SOLN Take 3 mLs by nebulization every 6 (six) hours as needed (for wheezing and cough).     Historical Provider, MD  lactose free nutrition (BOOST PLUS) LIQD Take 237 mLs by mouth 2 (two) times daily between meals.    Historical Provider, MD  nitroGLYCERIN (NITROSTAT) 0.4 MG SL tablet Place 0.4 mg under the tongue every 5 (five) minutes as needed for chest pain.    Historical Provider, MD  polyethylene glycol (MIRALAX /  GLYCOLAX) packet Take 17 g by mouth daily.    Historical Provider, MD  potassium chloride SA (K-DUR,KLOR-CON) 20 MEQ tablet Take 40 mEq by mouth daily.    Historical Provider, MD  tamsulosin (FLOMAX) 0.4 MG CAPS capsule Take 0.4 mg by mouth at bedtime. Hold for SBP < 95    Historical Provider, MD  zolpidem (AMBIEN) 5 MG tablet Take 2.5 mg by mouth at bedtime as needed for sleep.    Historical Provider, MD    Family History No family history on file.  Social History Social History  Substance Use Topics  . Smoking status: Former Smoker    Quit date: 07/24/1983  . Smokeless tobacco: Former Systems developer    Quit date: 11/18/1970  . Alcohol use Yes     Comment: occasionally     Allergies   Penicillins; Bee venom; and Risperidone and related   Review of Systems Review of Systems  Unable to perform ROS: Dementia   Physical Exam Updated Vital Signs BP 112/77   Pulse 80   Temp 98.4 F (36.9 C)   Resp 18   SpO2 97%   Physical Exam  Constitutional: He is oriented to person, place, and time. He appears well-developed and well-nourished. He has a sickly appearance. He appears ill. No distress.  HENT:  Head: Normocephalic and atraumatic.  Eyes: Conjunctivae are normal.  Neck: Neck supple.  Cardiovascular: Normal rate, regular rhythm and normal heart sounds.  Exam reveals no friction rub.   No murmur heard. Pulmonary/Chest: Effort normal. Tachypnea noted. No respiratory distress. He has no wheezes. He has rales in the right lower field and the left lower field.  Abdominal: Soft. He exhibits distension. There is no tenderness. There is no guarding.  Musculoskeletal: He exhibits edema (3+ pitting edema b/l LE).  Neurological: He is alert and oriented to person, place, and time. He exhibits normal muscle tone.  Skin: Skin is warm. Capillary refill takes less than 2 seconds.  Psychiatric: He has a normal mood and affect.  Nursing note and vitals reviewed.    ED Treatments / Results    DIAGNOSTIC STUDIES: Oxygen Saturation is 97% on RA, adequate by my interpretation.    COORDINATION OF CARE: 1:18 AM Discussed treatment plan with pt at bedside which includes lab work and blood work and pt agreed to plan.  Labs (all labs ordered are listed, but only abnormal results are displayed) Labs Reviewed  CBC WITH DIFFERENTIAL/PLATELET - Abnormal; Notable for the following:       Result Value   RBC 3.97 (*)    Hemoglobin 11.8 (*)    HCT 36.8 (*)    All other components within normal limits  COMPREHENSIVE METABOLIC PANEL - Abnormal; Notable for the following:    Chloride 115 (*)    CO2 20 (*)    Glucose, Bld 122 (*)    BUN 22 (*)    Calcium 8.3 (*)    Total Protein 6.3 (*)    Albumin  2.6 (*)    AST 14 (*)    ALT 10 (*)    GFR calc non Af Amer 56 (*)    All other components within normal limits  BRAIN NATRIURETIC PEPTIDE - Abnormal; Notable for the following:    B Natriuretic Peptide 113.3 (*)    All other components within normal limits  URINALYSIS, ROUTINE W REFLEX MICROSCOPIC (NOT AT [facility]) - Abnormal; Notable for the following:    APPearance CLOUDY (*)    Hgb urine dipstick LARGE (*)    Protein, ur 100 (*)    Leukocytes, UA LARGE (*)    All other components within normal limits  URINE MICROSCOPIC-ADD ON - Abnormal; Notable for the following:    Squamous Epithelial / LPF 0-5 (*)    Bacteria, UA MANY (*)    All other components within normal limits  I-STAT CHEM 8, ED - Abnormal; Notable for the following:    Chloride 115 (*)    BUN 31 (*)    Glucose, Bld 112 (*)    Calcium, Ion 1.05 (*)    Hemoglobin 11.9 (*)    HCT 35.0 (*)    All other components within normal limits  MRSA PCR SCREENING  URINE CULTURE  MAGNESIUM  I-STAT CG4 LACTIC ACID, ED  I-STAT TROPOININ, ED  I-STAT CG4 LACTIC ACID, ED    EKG  EKG Interpretation  Date/Time:  Saturday April 27 2016 01:17:21 EDT Ventricular Rate:  86 PR Interval:    QRS Duration: 131 QT Interval:  378 QTC  Calculation: 453 R Axis:   -31 Text Interpretation:  Atrial fibrillation Nonspecific intraventricular conduction delay Borderline T abnormalities, lateral leads Artifact in lead(s) III V1 V2 SUGGEST REPEAT TRACING Interpretation limited secondary to artifact Confirmed by Ellender Hose MD, Lysbeth Galas [phone]) on 04/27/2016 3:39:54 PM       Radiology Dg Chest 1 View  Result Date: 04/27/2016 CLINICAL DATA:  Urinary retention.  Cough.  History dementia, CHF. EXAM: CHEST 1 VIEW COMPARISON:  Chest radiograph February 20, 2016 FINDINGS: The cardiac silhouette is mildly enlarged, mediastinal silhouette is nonsuspicious. Calcified aortic knob. Bibasilar strandy densities. Mild pulmonary vascular congestion without pleural effusion or focal consolidation. No pneumothorax. Osteopenia. IMPRESSION: Mild cardiomegaly and pulmonary vascular congestion. Bibasilar atelectasis. Electronically Signed   By: Elon Alas M.D.   On: 04/27/2016 01:58    Procedures Procedures (including critical care time)  Medications Ordered in ED Medications - No data to display   Initial Impression / Assessment and Plan / ED Course  I have reviewed the triage vital signs and the nursing notes.  Pertinent labs & imaging results that were available during my care of the patient were reviewed by me and considered in my medical decision making (see chart for details).  Clinical Course    82 yo M with PMHx of dementia, CKD, AFib on Eliquis who p/w increased edema, decreased urinary output. On arrival, VSS. Pt hypervolemic on exam with anasarca, mild increased WOB with b/l rales. CXR confirms pulmonary edema/fluid overload. EKG non-ischemic. Renal function at baseline and wnl. BNP mildly elevated. Trop negative. Regarding his decreased UOP - unclear etiology. Possible mild intravascular depletion versus response to lack of diuresis (which he has been on long term). Pt hypervolemic on exam and will need diuresis for WOB, pulmonary edema.  Otherwise, pt also noted to have UTI and will start Rocephin IV. He has failed outpt CIpro. Pt o/w HDS. Discussed plan of care with pt's son via telephone, who is in agreement. Will  admit for diuresis, treatment of UTI, monitoring of UOP.  Final Clinical Impressions(s) / ED Diagnoses   Final diagnoses:  Acute pulmonary edema ([place])  Anasarca  Urinary tract infection associated with indwelling urethral catheter, initial encounter ([place])   I personally performed the services described in this documentation, which was scribed in my presence. The recorded information has been reviewed and is accurate.    Duffy Bruce, MD 04/27/16 [phone]

## 2016-04-27 NOTE — Progress Notes (Signed)
Pt has had 2 large loose stools, one last night and 1 this AM. Pt has a foley catheter draining clear yellow urine  Bladder scan shows 10 ml of urine.

## 2016-04-27 NOTE — Discharge Instructions (Signed)

## 2016-04-27 NOTE — ED Notes (Signed)
Attempted to call report

## 2016-04-27 NOTE — ED Triage Notes (Signed)
Pt from [facility] presented off of [facility]. Facility stated urinary retention. He got 750cc fluid yesterday, cath from facility flushed and changed. Facility reports 50cc of urine

## 2016-04-27 NOTE — Progress Notes (Addendum)
82 y.o. male with medical history significant of advanced alzheimer's dementia, chronic diastolic CHF.  They had been trying to wean him off of lasix at his SNF, poor UOP despite having urine catheter and flush.  Recent diagnosis of UTI and started on cipro 10/9.  Due to poor UOP patient brought in to ED.  Patient not able to provide any history due to advanced dementia. Also found to have a urinary tract infection  Assessment and plan 1. Low urine output  He was sent to the ED on 10/13 for low urine output with no new orders. Urine output has since improved. Total output documented 775 cc since yesterday  2. Chronic diastolic congestive heart failure ([place]) He is taking Lasix for fluid retention. He is taking K-Dur. His Lasix has been held starting on 04/21/16 due to decreased urinary output. His renal function has been stable. On 10/15 his input was 820 and output 465. Staff reports he is eating and drinking well.    His last ECHO was 04/2014 with results "The estimated ejection fraction was in the range of 50% to 55%. Although no diagnostic regional wall motion abnormality was identified, this possibility cannot be completely excluded on the basis of this study"  3. Chronic atrial fibrillation (HCC) His rate is controlled. HR today 83. He is on Eliquis for anticoagulation.   4. Dementia with behavioral disturbance, unspecified dementia type He is not taking any memory medications.    5. CKD (chronic kidney disease) stage 3, GFR 30-59 ml/min Last BUN 23.4, Creat 0.99 on 10/16. His BUN is trending down since 10/6. His urine output is diminished, but has improved over the past 3 days. He has good oral intake per staff.   6. Benign prostatic hyperplasia with urinary retention He is on Flomax for insertion of catheter. He has an indwelling urinary catheter with drainage bag to gravity. Will continue to check  PVR  every shift  7. Constipation, unspecified constipation type He takes Miralax  daily, hold MiraLAX. Last week he was constipated and good results from glycerin suppository.   Now having diarrhea.2 large loose stools, one last night and 1 this AM Rule out impaction

## 2016-04-27 NOTE — H&P (Signed)
History and Physical    Bryan [name] AQT:[id number] DOB: August 26, 1922 DOA: 04/27/2016   PCP: Hollace Kinnier, DO Chief Complaint:  Chief Complaint  Patient presents with  . Urinary Retention    HPI: Bryan [name] is a 82 y.o. male with medical history significant of advanced alzheimer's dementia, chronic diastolic CHF.  They had been trying to wean him off of lasix at his SNF, poor UOP despite having urine catheter and flush.  Recent diagnosis of UTI and started on cipro 10/9.  Due to poor UOP patient brought in to ED.  Patient not able to provide any history due to advanced dementia.  ED Course: Found to be fluid overloaded, creatinine is actually good at 1.0.  Given lasix 40mg  IV and having significant UOP.  Does have UTI as well.  Review of Systems: unable to perform due to mental status.   Past Medical History:  Diagnosis Date  . Allergic rhinitis   . Alzheimer's disease 2013  . Atrial fibrillation ([place])    Long term anti coagulation w/ warfarin fro stroke risk reduction  . BPH (benign prostatic hyperplasia)   . Chronic venous insufficiency 01/12/2013  . Dementia with behavioral disturbance 12/29/13  . Gait disorder   . GERD (gastroesophageal reflux disease)   . History of CVA (cerebrovascular accident) 2001   Rt.MCA  . Hyperlipidemia   . Lactose intolerance   . Long term (current) use of anticoagulants 11/23/2012   Long-term anticoagulation with warfarin for stroke risk reduction related to AF    Past Surgical History:  Procedure Laterality Date  . CATARACT EXTRACTION W/ INTRAOCULAR LENS  IMPLANT, BILATERAL    . HERNIA REPAIR Right 1970s  . KNEE SURGERY Right 2000  . MASS EXCISION Left 07/30/2013   Procedure: EXCISION OF BASAL CELL CARCINOMA  LEFT FOREHEAD ;  Surgeon: Irene Limbo, MD;  Location: [facility];  Service: Plastics;  Laterality: Left;  . RETINAL DETACHMENT SURGERY Right 2009     reports that he quit smoking about 32 years ago. He  quit smokeless tobacco use about 45 years ago. He reports that he drinks alcohol. He reports that he does not use drugs.  Allergies  Allergen Reactions  . Penicillins Rash  . Bee Venom   . Risperidone And Related     Unstable gait    No family history on file. Unable to perform due to mental status.   Prior to Admission medications   Medication Sig Start Date End Date Taking? Authorizing Provider  apixaban (ELIQUIS) 2.5 MG TABS tablet Take 2.5 mg by mouth 2 (two) times daily.    Historical Provider, MD  ciclopirox (LOPROX) 0.77 % cream Apply topically 2 (two) times daily as needed (redness).    Historical Provider, MD  furosemide (LASIX) 40 MG tablet Take 40 mg by mouth.    Historical Provider, MD  ipratropium-albuterol (DUONEB) 0.5-2.5 (3) MG/3ML SOLN Take 3 mLs by nebulization every 6 (six) hours as needed (for wheezing and cough).     Historical Provider, MD  lactose free nutrition (BOOST PLUS) LIQD Take 237 mLs by mouth 2 (two) times daily between meals.    Historical Provider, MD  nitroGLYCERIN (NITROSTAT) 0.4 MG SL tablet Place 0.4 mg under the tongue every 5 (five) minutes as needed for chest pain.    Historical Provider, MD  polyethylene glycol (MIRALAX / GLYCOLAX) packet Take 17 g by mouth daily.    Historical Provider, MD  potassium chloride SA (K-DUR,KLOR-CON) 20 MEQ tablet Take 40 mEq by  mouth daily.    Historical Provider, MD  tamsulosin (FLOMAX) 0.4 MG CAPS capsule Take 0.4 mg by mouth at bedtime. Hold for SBP < 95    Historical Provider, MD  zolpidem (AMBIEN) 5 MG tablet Take 2.5 mg by mouth at bedtime as needed for sleep.    Historical Provider, MD    Physical Exam: Vitals:   04/27/16 0115 04/27/16 0215 04/27/16 0230 04/27/16 0300  BP: 128/60 131/60 125/63 126/65  Pulse: 86 83 83 80  Resp: 24 25 21  (!) 27  Temp:      SpO2: 97% 100% 97% 97%      Constitutional: NAD, calm, comfortable Eyes: PERRL, lids and conjunctivae normal ENMT: Mucous membranes are moist.  Posterior pharynx clear of any exudate or lesions.Normal dentition.  Neck: normal, supple, no masses, no thyromegaly Respiratory: Coarse breath sounds bilaterally Cardiovascular: Regular rate and rhythm, no murmurs / rubs / gallops. 4+ pitting edema BLE. 2+ pedal pulses. No carotid bruits.  Abdomen: no tenderness, no masses palpated. No hepatosplenomegaly. Bowel sounds positive.  Musculoskeletal: no clubbing / cyanosis. No joint deformity upper and lower extremities. Good ROM, no contractures. Normal muscle tone.  Skin: no rashes, lesions, ulcers. No induration Neurologic: MAE  Psychiatric: Non verbal, severe and advanced alzheimer's dementia.   Labs on Admission: I have personally reviewed following labs and imaging studies  CBC:  Recent Labs Lab 04/27/16 0110 04/27/16 0128  WBC 9.7  --   NEUTROABS 6.8  --   HGB 11.8* 11.9*  HCT 36.8* 35.0*  MCV 92.7  --   PLT 332  --    Basic Metabolic Panel:  Recent Labs Lab 04/22/16 04/27/16 0110 04/27/16 0128  NA 143 141 145  K 3.7 3.6 3.9  CL  --  115* 115*  CO2  --  20*  --   GLUCOSE  --  122* 112*  BUN 23* 22* 31*  CREATININE 1.0 1.11 1.10  CALCIUM  --  8.3*  --    GFR: CrCl cannot be calculated (Unknown ideal weight.). Liver Function Tests:  Recent Labs Lab 04/27/16 0110  AST 14*  ALT 10*  ALKPHOS 78  BILITOT 0.7  PROT 6.3*  ALBUMIN 2.6*   No results for input(s): LIPASE, AMYLASE in the last 168 hours. No results for input(s): AMMONIA in the last 168 hours. Coagulation Profile: No results for input(s): INR, PROTIME in the last 168 hours. Cardiac Enzymes: No results for input(s): CKTOTAL, CKMB, CKMBINDEX, TROPONINI in the last 168 hours. BNP (last 3 results) No results for input(s): PROBNP in the last 8760 hours. HbA1C: No results for input(s): HGBA1C in the last 72 hours. CBG: No results for input(s): GLUCAP in the last 168 hours. Lipid Profile: No results for input(s): CHOL, HDL, LDLCALC, TRIG, CHOLHDL,  LDLDIRECT in the last 72 hours. Thyroid Function Tests: No results for input(s): TSH, T4TOTAL, FREET4, T3FREE, THYROIDAB in the last 72 hours. Anemia Panel: No results for input(s): VITAMINB12, FOLATE, FERRITIN, TIBC, IRON, RETICCTPCT in the last 72 hours. Urine analysis:    Component Value Date/Time   COLORURINE YELLOW 04/27/2016 0128   APPEARANCEUR CLOUDY (A) 04/27/2016 0128   LABSPEC 1.025 04/27/2016 0128   PHURINE 6.0 04/27/2016 0128   GLUCOSEU NEGATIVE 04/27/2016 0128   HGBUR LARGE (A) 04/27/2016 0128   BILIRUBINUR NEGATIVE 04/27/2016 0128   KETONESUR NEGATIVE 04/27/2016 0128   PROTEINUR 100 (A) 04/27/2016 0128   NITRITE NEGATIVE 04/27/2016 0128   LEUKOCYTESUR LARGE (A) 04/27/2016 0128   Sepsis Labs: @LABRCNTIP (procalcitonin:4,lacticidven:4) )  Recent Results (from the past 240 hour(s))  Urine culture     Status: Abnormal   Collection Time: 04/19/16  2:33 AM  Result Value Ref Range Status   Specimen Description URINE, RANDOM  Final   Special Requests NONE  Final   Culture <10,000 COLONIES/mL INSIGNIFICANT GROWTH (A)  Final   Report Status 04/20/2016 FINAL  Final     Radiological Exams on Admission: Dg Chest 1 View  Result Date: 04/27/2016 CLINICAL DATA:  Urinary retention.  Cough.  History dementia, CHF. EXAM: CHEST 1 VIEW COMPARISON:  Chest radiograph February 20, 2016 FINDINGS: The cardiac silhouette is mildly enlarged, mediastinal silhouette is nonsuspicious. Calcified aortic knob. Bibasilar strandy densities. Mild pulmonary vascular congestion without pleural effusion or focal consolidation. No pneumothorax. Osteopenia. IMPRESSION: Mild cardiomegaly and pulmonary vascular congestion. Bibasilar atelectasis. Electronically Signed   By: Elon Alas M.D.   On: 04/27/2016 01:58    EKG: Independently reviewed.  Assessment/Plan Principal Problem:   Acute on chronic diastolic CHF (congestive heart failure) (HCC) Active Problems:   Alzheimer's disease   UTI (urinary  tract infection)    1. Acute on chronic diastolic CHF - 1. CHF pathway 2. Lasix 40mg  IV BID 3. Daily BMP 4. If he starts to have hypernatremia (like he did in the past that caused them to try and wean him off of lasix), would consider adding a thiazide diuretic 2. UTI - 1. Rocephin 2. Cultures pending 3. Alzheimer's disease - advanced but mental status is currently his baseline according to family.   DVT prophylaxis: eliquis Code Status: DNR Family Communication: Family at bedside Consults called: None Admission status: Admit to inpatient   Etta Quill DO [facility] Pager [phone] from 7PM-7AM  If 7AM-7PM, please contact the day physician for the patient [email or web address] Password [email or web address]  04/27/2016, 3:44 AM

## 2016-04-28 ENCOUNTER — Inpatient Hospital Stay (HOSPITAL_COMMUNITY): Payer: Medicare Other

## 2016-04-28 DIAGNOSIS — R05 Cough: Secondary | ICD-10-CM

## 2016-04-28 DIAGNOSIS — G300 Alzheimer's disease with early onset: Secondary | ICD-10-CM

## 2016-04-28 DIAGNOSIS — R059 Cough, unspecified: Secondary | ICD-10-CM

## 2016-04-28 DIAGNOSIS — F0281 Dementia in other diseases classified elsewhere with behavioral disturbance: Secondary | ICD-10-CM

## 2016-04-28 DIAGNOSIS — J810 Acute pulmonary edema: Secondary | ICD-10-CM

## 2016-04-28 LAB — COMPREHENSIVE METABOLIC PANEL
ALK PHOS: 81 U/L (ref 38–126)
ALT: 10 U/L — AB (ref 17–63)
AST: 17 U/L (ref 15–41)
Albumin: 2.6 g/dL — ABNORMAL LOW (ref 3.5–5.0)
Anion gap: 7 (ref 5–15)
BUN: 19 mg/dL (ref 6–20)
CALCIUM: 8.4 mg/dL — AB (ref 8.9–10.3)
CHLORIDE: 111 mmol/L (ref 101–111)
CO2: 24 mmol/L (ref 22–32)
CREATININE: 1.28 mg/dL — AB (ref 0.61–1.24)
GFR, EST AFRICAN AMERICAN: 55 mL/min — AB (ref >60)
GFR, EST NON AFRICAN AMERICAN: 48 mL/min — AB (ref >60)
Glucose, Bld: 105 mg/dL — ABNORMAL HIGH (ref 65–99)
Potassium: 2.9 mmol/L — ABNORMAL LOW (ref 3.5–5.1)
Sodium: 142 mmol/L (ref 135–145)
Total Bilirubin: 1.2 mg/dL (ref 0.3–1.2)
Total Protein: 6.3 g/dL — ABNORMAL LOW (ref 6.5–8.1)

## 2016-04-28 LAB — CBC
HCT: 37.2 % — ABNORMAL LOW (ref 39.0–52.0)
HEMOGLOBIN: 12 g/dL — AB (ref 13.0–17.0)
MCH: 29.5 pg (ref 26.0–34.0)
MCHC: 32.3 g/dL (ref 30.0–36.0)
MCV: 91.4 fL (ref 78.0–100.0)
Platelets: 346 10*3/uL (ref 150–400)
RBC: 4.07 MIL/uL — AB (ref 4.22–5.81)
RDW: 14.2 % (ref 11.5–15.5)
WBC: 8.6 10*3/uL (ref 4.0–10.5)

## 2016-04-28 MED ORDER — FUROSEMIDE 40 MG PO TABS: 40.0000 mg | ORAL_TABLET | ORAL | 0 refills | Status: DC

## 2016-04-28 MED ORDER — POTASSIUM CHLORIDE CRYS ER 20 MEQ PO TBCR: 40.0000 meq | EXTENDED_RELEASE_TABLET | Freq: Two times a day (BID) | ORAL | 0 refills | Status: DC

## 2016-04-28 MED ORDER — APIXABAN 5 MG PO TABS: 5.0000 mg | ORAL_TABLET | Freq: Two times a day (BID) | ORAL | 1 refills | Status: DC

## 2016-04-28 MED ORDER — POTASSIUM CHLORIDE CRYS ER 20 MEQ PO TBCR
40.0000 meq | EXTENDED_RELEASE_TABLET | ORAL | Status: DC
  Administered 2016-04-28: 40 meq via ORAL
  Filled 2016-04-28: qty 2

## 2016-04-28 NOTE — Discharge Summary (Addendum)
Physician Discharge Summary  Kaleem Sartwell MRN: [id number] DOB/AGE: Nov 06, 1922 82 y.o.  PCP: Hollace Kinnier, DO   Admit date: 04/27/2016 Discharge date: 04/28/2016  Discharge Diagnoses:    Principal Problem:   Acute on chronic diastolic CHF (congestive heart failure) (HCC) Active Problems:   Alzheimer's disease   Acute pulmonary edema (HCC)   Cough    Follow-up recommendations Follow-up with PCP in 3-5 days , including all  additional recommended appointments as below Follow-up CBC, CMP  once every 2 weeks  Follow-up of the results of urine culture from 10/21, showed     Culture >=100,000 COLONIES/mL ENTEROCOCCUS FAECALIS   Sensitivity pending  -      Current Discharge Medication List    START taking these medications   Details  furosemide (LASIX) 40 MG tablet Take 1 tablet (40 mg total) by mouth every other day. Qty: 30 tablet, Refills: 0      CONTINUE these medications which have CHANGED   Details  apixaban (ELIQUIS) 5 MG TABS tablet Take 1 tablet (5 mg total) by mouth 2 (two) times daily. Qty: 60 tablet, Refills: 1    potassium chloride SA (K-DUR,KLOR-CON) 20 MEQ tablet Take 2 tablets (40 mEq total) by mouth 2 (two) times daily. Qty: 8 tablet, Refills: 0      CONTINUE these medications which have NOT CHANGED   Details  ciclopirox (LOPROX) 0.77 % cream Apply topically 2 (two) times daily as needed (redness).    ipratropium-albuterol (DUONEB) 0.5-2.5 (3) MG/3ML SOLN Take 3 mLs by nebulization every 6 (six) hours as needed (for wheezing and cough).     nitroGLYCERIN (NITROSTAT) 0.4 MG SL tablet Place 0.4 mg under the tongue every 5 (five) minutes as needed for chest pain.    polyethylene glycol (MIRALAX / GLYCOLAX) packet Take 17 g by mouth daily.    tamsulosin (FLOMAX) 0.4 MG CAPS capsule Take 0.4 mg by mouth at bedtime. Hold for SBP < 95      STOP taking these medications     lactose free nutrition (BOOST PLUS) LIQD      zolpidem (AMBIEN) 5 MG  tablet          Discharge Condition: Stable   Discharge Instructions Get Medicines reviewed and adjusted: Please take all your medications with you for your next visit with your Primary MD  Please request your Primary MD to go over all hospital tests and procedure/radiological results at the follow up, please ask your Primary MD to get all Hospital records sent to his/her office.  If you experience worsening of your admission symptoms, develop shortness of breath, life threatening emergency, suicidal or homicidal thoughts you must seek medical attention immediately by calling 911 or calling your MD immediately if symptoms less severe.  You must read complete instructions/literature along with all the possible adverse reactions/side effects for all the Medicines you take and that have been prescribed to you. Take any new Medicines after you have completely understood and accpet all the possible adverse reactions/side effects.   Do not drive when taking Pain medications.   Do not take more than prescribed Pain, Sleep and Anxiety Medications  Special Instructions: If you have smoked or chewed Tobacco in the last 2 yrs please stop smoking, stop any regular Alcohol and or any Recreational drug use.  Wear Seat belts while driving.  Please note  You were cared for by a hospitalist during your hospital stay. Once you are discharged, your primary care physician will handle any further medical issues.  Please note that NO REFILLS for any discharge medications will be authorized once you are discharged, as it is imperative that you return to your primary care physician (or establish a relationship with a primary care physician if you do not have one) for your aftercare needs so that they can reassess your need for medications and monitor your lab values.     Allergies  Allergen Reactions  . Penicillins Rash  . Bee Venom   . Risperidone And Related     Unstable gait      Disposition:  SNF   Consults:  None     Significant Diagnostic Studies:  Dg Chest 1 View  Result Date: 04/27/2016 CLINICAL DATA:  Urinary retention.  Cough.  History dementia, CHF. EXAM: CHEST 1 VIEW COMPARISON:  Chest radiograph February 20, 2016 FINDINGS: The cardiac silhouette is mildly enlarged, mediastinal silhouette is nonsuspicious. Calcified aortic knob. Bibasilar strandy densities. Mild pulmonary vascular congestion without pleural effusion or focal consolidation. No pneumothorax. Osteopenia. IMPRESSION: Mild cardiomegaly and pulmonary vascular congestion. Bibasilar atelectasis. Electronically Signed   By: Elon Alas M.D.   On: 04/27/2016 01:58       Filed Weights   04/27/16 0524 04/28/16 0500  Weight: 94.6 kg (208 lb 8 oz) 90.1 kg (198 lb 9.6 oz)     Microbiology: Recent Results (from the past 240 hour(s))  Urine culture     Status: Abnormal   Collection Time: 04/19/16  2:33 AM  Result Value Ref Range Status   Specimen Description URINE, RANDOM  Final   Special Requests NONE  Final   Culture <10,000 COLONIES/mL INSIGNIFICANT GROWTH (A)  Final   Report Status 04/20/2016 FINAL  Final  MRSA PCR Screening     Status: None   Collection Time: 04/27/16  7:01 AM  Result Value Ref Range Status   MRSA by PCR NEGATIVE NEGATIVE Final    Comment:        The GeneXpert MRSA Assay (FDA approved for NASAL specimens only), is one component of a comprehensive MRSA colonization surveillance program. It is not intended to diagnose MRSA infection nor to guide or monitor treatment for MRSA infections.        Blood Culture    Component Value Date/Time   SDES URINE, RANDOM 04/19/2016 0233   SPECREQUEST NONE 04/19/2016 0233   CULT <10,000 COLONIES/mL INSIGNIFICANT GROWTH (A) 04/19/2016 0233   REPTSTATUS 04/20/2016 FINAL 04/19/2016 0233      Labs: Results for orders placed or performed during the hospital encounter of 04/27/16 (from the past 48 hour(s))  CBC with Differential      Status: Abnormal   Collection Time: 04/27/16  1:10 AM  Result Value Ref Range   WBC 9.7 4.0 - 10.5 K/uL   RBC 3.97 (L) 4.22 - 5.81 MIL/uL   Hemoglobin 11.8 (L) 13.0 - 17.0 g/dL   HCT 36.8 (L) 39.0 - 52.0 %   MCV 92.7 78.0 - 100.0 fL   MCH 29.7 26.0 - 34.0 pg   MCHC 32.1 30.0 - 36.0 g/dL   RDW 14.5 11.5 - 15.5 %   Platelets 332 150 - 400 K/uL   Neutrophils Relative % 70 %   Neutro Abs 6.8 1.7 - 7.7 K/uL   Lymphocytes Relative 20 %   Lymphs Abs 1.9 0.7 - 4.0 K/uL   Monocytes Relative 8 %   Monocytes Absolute 0.7 0.1 - 1.0 K/uL   Eosinophils Relative 2 %   Eosinophils Absolute 0.2 0.0 - 0.7 K/uL   Basophils  Relative 0 %   Basophils Absolute 0.0 0.0 - 0.1 K/uL  Comprehensive metabolic panel     Status: Abnormal   Collection Time: 04/27/16  1:10 AM  Result Value Ref Range   Sodium 141 135 - 145 mmol/L   Potassium 3.6 3.5 - 5.1 mmol/L   Chloride 115 (H) 101 - 111 mmol/L   CO2 20 (L) 22 - 32 mmol/L   Glucose, Bld 122 (H) 65 - 99 mg/dL   BUN 22 (H) 6 - 20 mg/dL   Creatinine, Ser 1.11 0.61 - 1.24 mg/dL   Calcium 8.3 (L) 8.9 - 10.3 mg/dL   Total Protein 6.3 (L) 6.5 - 8.1 g/dL   Albumin 2.6 (L) 3.5 - 5.0 g/dL   AST 14 (L) 15 - 41 U/L   ALT 10 (L) 17 - 63 U/L   Alkaline Phosphatase 78 38 - 126 U/L   Total Bilirubin 0.7 0.3 - 1.2 mg/dL   GFR calc non Af Amer 56 (L) >60 mL/min   GFR calc Af Amer >60 >60 mL/min    Comment: (NOTE) The eGFR has been calculated using the CKD EPI equation. This calculation has not been validated in all clinical situations. eGFR's persistently <60 mL/min signify possible Chronic Kidney Disease.    Anion gap 6 5 - 15  Brain natriuretic peptide     Status: Abnormal   Collection Time: 04/27/16  1:14 AM  Result Value Ref Range   B Natriuretic Peptide 113.3 (H) 0.0 - 100.0 pg/mL  I-Stat Troponin, ED (not at [facility])     Status: None   Collection Time: 04/27/16  1:26 AM  Result Value Ref Range   Troponin i, poc 0.00 0.00 - 0.08 ng/mL   Comment 3             Comment: Due to the release kinetics of cTnI, a negative result within the first hours of the onset of symptoms does not rule out myocardial infarction with certainty. If myocardial infarction is still suspected, repeat the test at appropriate intervals.   Urinalysis, Routine w reflex microscopic (not at [facility])     Status: Abnormal   Collection Time: 04/27/16  1:28 AM  Result Value Ref Range   Color, Urine YELLOW YELLOW   APPearance CLOUDY (A) CLEAR   Specific Gravity, Urine 1.025 1.005 - 1.030   pH 6.0 5.0 - 8.0   Glucose, UA NEGATIVE NEGATIVE mg/dL   Hgb urine dipstick LARGE (A) NEGATIVE   Bilirubin Urine NEGATIVE NEGATIVE   Ketones, ur NEGATIVE NEGATIVE mg/dL   Protein, ur 100 (A) NEGATIVE mg/dL   Nitrite NEGATIVE NEGATIVE   Leukocytes, UA LARGE (A) NEGATIVE  I-Stat Chem 8, ED     Status: Abnormal   Collection Time: 04/27/16  1:28 AM  Result Value Ref Range   Sodium 145 135 - 145 mmol/L   Potassium 3.9 3.5 - 5.1 mmol/L   Chloride 115 (H) 101 - 111 mmol/L   BUN 31 (H) 6 - 20 mg/dL   Creatinine, Ser 1.10 0.61 - 1.24 mg/dL   Glucose, Bld 112 (H) 65 - 99 mg/dL   Calcium, Ion 1.05 (L) 1.15 - 1.40 mmol/L   TCO2 24 0 - 100 mmol/L   Hemoglobin 11.9 (L) 13.0 - 17.0 g/dL   HCT 35.0 (L) 39.0 - 52.0 %  I-Stat CG4 Lactic Acid, ED     Status: None   Collection Time: 04/27/16  1:28 AM  Result Value Ref Range   Lactic Acid, Venous 1.66 0.5 -  1.9 mmol/L  Urine microscopic-add on     Status: Abnormal   Collection Time: 04/27/16  1:28 AM  Result Value Ref Range   Squamous Epithelial / LPF 0-5 (A) NONE SEEN   WBC, UA TOO NUMEROUS TO COUNT 0 - 5 WBC/hpf   RBC / HPF TOO NUMEROUS TO COUNT 0 - 5 RBC/hpf   Bacteria, UA MANY (A) NONE SEEN   Urine-Other LESS THAN 10 mL OF URINE SUBMITTED   I-Stat CG4 Lactic Acid, ED     Status: None   Collection Time: 04/27/16  3:54 AM  Result Value Ref Range   Lactic Acid, Venous 0.84 0.5 - 1.9 mmol/L  MRSA PCR Screening     Status: None   Collection  Time: 04/27/16  7:01 AM  Result Value Ref Range   MRSA by PCR NEGATIVE NEGATIVE    Comment:        The GeneXpert MRSA Assay (FDA approved for NASAL specimens only), is one component of a comprehensive MRSA colonization surveillance program. It is not intended to diagnose MRSA infection nor to guide or monitor treatment for MRSA infections.   Magnesium     Status: None   Collection Time: 04/27/16 10:20 AM  Result Value Ref Range   Magnesium 2.0 1.7 - 2.4 mg/dL  CBC     Status: Abnormal   Collection Time: 04/28/16  2:26 AM  Result Value Ref Range   WBC 8.6 4.0 - 10.5 K/uL   RBC 4.07 (L) 4.22 - 5.81 MIL/uL   Hemoglobin 12.0 (L) 13.0 - 17.0 g/dL   HCT 37.2 (L) 39.0 - 52.0 %   MCV 91.4 78.0 - 100.0 fL   MCH 29.5 26.0 - 34.0 pg   MCHC 32.3 30.0 - 36.0 g/dL   RDW 14.2 11.5 - 15.5 %   Platelets 346 150 - 400 K/uL  Comprehensive metabolic panel     Status: Abnormal   Collection Time: 04/28/16  2:26 AM  Result Value Ref Range   Sodium 142 135 - 145 mmol/L   Potassium 2.9 (L) 3.5 - 5.1 mmol/L    Comment: DELTA CHECK NOTED   Chloride 111 101 - 111 mmol/L   CO2 24 22 - 32 mmol/L   Glucose, Bld 105 (H) 65 - 99 mg/dL   BUN 19 6 - 20 mg/dL   Creatinine, Ser 1.28 (H) 0.61 - 1.24 mg/dL   Calcium 8.4 (L) 8.9 - 10.3 mg/dL   Total Protein 6.3 (L) 6.5 - 8.1 g/dL   Albumin 2.6 (L) 3.5 - 5.0 g/dL   AST 17 15 - 41 U/L   ALT 10 (L) 17 - 63 U/L   Alkaline Phosphatase 81 38 - 126 U/L   Total Bilirubin 1.2 0.3 - 1.2 mg/dL   GFR calc non Af Amer 48 (L) >60 mL/min   GFR calc Af Amer 55 (L) >60 mL/min    Comment: (NOTE) The eGFR has been calculated using the CKD EPI equation. This calculation has not been validated in all clinical situations. eGFR's persistently <60 mL/min signify possible Chronic Kidney Disease.    Anion gap 7 5 - 15           HPI :  82 y.o.malewith medical history significant of advanced alzheimer's dementia, chronic diastolic CHF. They had been trying to wean  him off of lasix at his SNF, poor UOP despite having urine catheter and flush. Recent diagnosis of UTI and started on cipro 10/9. Due to poor UOP patient brought in to  ED. Patient not able to provide any history due to advanced dementia. Also sent to the ER due to concern for urinary retention    HOSPITAL COURSE  1. Low urine output  likely secondary to decreased by mouth intake in the setting of recent UTI He was sent to the ED on 10/13 for low urine output with no new orders.Urine output has since improved. Patient responded extremely well to IV Lasix, total urine output 3725 without any problems voiding. Renal function remained stable, creatinine 1.1>1.28.  2. Acute on Chronic diastolic congestive heart failure ([place]) Patient was on Lasix for fluid retention.  Marland Kitchen His Lasix has been held starting on 04/21/16 due to decreased urinary output. His renal function has been stable.  apparently had decreased by mouth intake prior to admission, therefore Lasix was held. His last ECHO was 04/2014 with results "The estimated ejection fraction was in the range of 50% to 55%.  Lasix was given IV with excellent response, no major change in renal function Would continue Lasix every 48 hours , to prevent volume overload Closely monitor potassium,  3. Chronic atrial fibrillation (HCC) His rate is controlled. HR today 83. He is on Eliquis for anticoagulation. Dose adjusted based on renal function and pharmacy recommendations  4. Dementia with behavioral disturbance, unspecified dementia type He is not taking any memory medications.    5.CKD (chronic kidney disease) stage 3, GFR 30-59 ml/min Last BUN 23.4, Creat 0.99 on 10/16. His BUN/creatinine was 19/1.28 prior to discharge. His urine output is excellent,    6. Benign prostatic hyperplasia with urinary retention He is on Flomax for insertion of catheter. He has an indwelling urinary catheter with drainage bag to gravity. Did not find any evidence of  urinary retention. PVR 10 cc  7. Constipation, with diarrhea in the hospital Patient on MiraLAX at home Patient had 2 large loose stools, one last night and 1 this AM KUBMild gaseous distended small bowel loops mid abdomen suspicious for ileus, enteritis or early bowel obstruction  . Clinically no evidence of obstruction Patient has good bowel sounds, is eating. Diarrhea could have been due to brief episode of viral gastroenteritis Patient did not have any evidence of impaction on digital rectal examination  8.   UTI -urine cx positive for enterococcus fecalis just resulted  Urine cx from 10/13 showed insignificant growth Urine culture from 10/21 as above , sensitivity pending , I suspect colonization due to chronic indwelling Foley catheter, discussed with Dr Lucianne Lei dam , he thinks patient is colonized and does not need treatment unless patient has a fever >100.5   UA negative for nitrite  , large amount of leukocytes , he will have pyuria with chronic foley We did not see any evidence of sx UTI this admission    Discharge Exam:   Blood pressure (!) 93/57, pulse 71, temperature 99.1 F (37.3 C), temperature source Oral, resp. rate 18, weight 90.1 kg (198 lb 9.6 oz), SpO2 94 %. Constitutional: NAD, calm, comfortable Eyes: PERRL, lids and conjunctivae normal ENMT: Mucous membranes are moist. Posterior pharynx clear of any exudate or lesions.Normal dentition.  Neck: normal, supple, no masses, no thyromegaly Respiratory: Coarse breath sounds bilaterally Cardiovascular: Regular rate and rhythm, no murmurs / rubs / gallops. 1+ pitting edema BLE. 2+ pedal pulses. No carotid bruits.  Abdomen: no tenderness, no masses palpated. No hepatosplenomegaly. Bowel sounds positive.  Musculoskeletal: no clubbing / cyanosis. No joint deformity upper and lower extremities. Good ROM, no contractures. Normal muscle tone.  Skin: no rashes, lesions, ulcers. No induration Neurologic: MAE  Psychiatric: Non  verbal, severe and advanced alzheimer's dementia.     Follow-up Information    [name], TIFFANY, DO. Schedule an appointment as soon as possible for a visit in 2 day(s).   Specialty:  Geriatric Medicine Why:  Hospital follow-up Contact information: [place]. [place] [address] [phone]           Signed: Reyne Dumas 04/28/2016, 9:32 AM        Time spent >45 mins

## 2016-04-28 NOTE — Progress Notes (Signed)
Bladder scan done, no any urine retained inside and DRE done, nothing impacted.will continue to monitor

## 2016-04-28 NOTE — Progress Notes (Signed)
SLP Cancellation Note  Patient Details Name: Bryan [name] MRN: [id number] DOB: Jan 02, 1923   Cancelled treatment:       Reason Eval/Treat Not Completed: Other (comment);SLP screened, no needs identified, will sign off. Discussed pt with daugther, he is tolerating diet, is followed by SLP at [facility]. No acute needs, will defer evaluation.    Aleese Kamps, Katherene Ponto 04/28/2016, 1:11 PM

## 2016-04-28 NOTE — Clinical Social Work Note (Signed)
Clinical Social Work Assessment  Patient Details  Name: Bryan [name] MRN: [id number] Date of Birth: 08-Mar-1923  Date of referral:  04/28/16               Reason for consult:  Facility Placement, Discharge Planning                Permission sought to share information with:  Family Supports Permission granted to share information::  Yes, Verbal Permission Granted  Name::     Bryan [name]  Relationship::  daughter  Contact Information:  [phone]  Housing/Transportation Living arrangements for the past 2 months:  [facility] ([facility]) Source of Information:  Adult Children Patient Interpreter Needed:  None Criminal Activity/Legal Involvement Pertinent to Current Situation/Hospitalization:  No - Comment as needed Significant Relationships:  Adult Children, Spouse Lives with:  Facility Resident Do you feel safe going back to the place where you live?  Yes Need for family participation in patient care:  Yes (Comment)  Care giving concerns:  No care giving concerns identified.   Social Worker assessment / plan:  CSW met with pt and daughter to address consult for New SNF. CSW introduced herself and explained role of social work. CSW also explained process of discharging and returning to [facility]. Pt's daughter shared that pt is from [facility] and she would like for him to return. CSW contacted the facility, and pt is able to return. CSW completed FL2 and sent discharge information to facility. CSW updated pt's daughter. [facility] will provide transportation. CSW is signing off as no further needs identified.   Employment status:  Retired Forensic scientist:  Medicare PT Recommendations:  Not assessed at this time [place] / Referral to community resources:  Other (Comment Required) ([facility])  Patient/Family's Response to care:  Pt's daughter was appreciative of CSW support.   Patient/Family's Understanding of and Emotional Response to  Diagnosis, Current Treatment, and Prognosis:  Pt's daughter understands that pt needs to return to [facility] for continued LTC.   Emotional Assessment Appearance:  Appears older than stated age Attitude/Demeanor/Rapport:  Unable to Assess Affect (typically observed):  Unable to Assess Orientation:  Fluctuating Orientation (Suspected and/or reported [place]) Alcohol / Substance use:  Never Used Psych involvement (Current and /or in the community):  No (Comment)  Discharge Needs  Concerns to be addressed:  No discharge needs identified Readmission within the last 30 days:  No Current discharge risk:  Cognitively Impaired Barriers to Discharge:  No Barriers Identified   Darden Dates, LCSW 04/28/2016, 5:10 PM

## 2016-04-28 NOTE — Discharge Summary (Signed)
Pt got discharged to [facility] nursing home, Iv taken off, and tele-monitor DC, discharge instructions provided to the pt's daughter on the bed side and to the nurse on the [facility], pt left via ambulance with all of her belongings accompanied with her daughter.

## 2016-04-28 NOTE — NC FL2 (Signed)
[place] LEVEL OF CARE SCREENING TOOL     IDENTIFICATION  Patient Name: Bryan [name] Birthdate: [birth year] Sex: male Admission Date (Current Location): 04/27/2016  [facility] and [place] Number:  Herbalist and Address:  [facility] [place]. [facility], [place] [address] [place], [place] [address]      Provider Number: [id number]  Attending Physician Name and Address:  Reyne Dumas, MD  Relative Name and Phone Number:       Current Level of Care: Domiciliary (Rest home) Recommended Level of Care: Memory Care Prior Approval Number:    Date Approved/Denied:   PASRR Number:    Discharge Plan: Domiciliary (Rest home)    Current Diagnoses: Patient Active Problem List   Diagnosis Date Noted  . Acute pulmonary edema (HCC)   . Cough   . UTI (urinary tract infection) 04/27/2016  . Acute on chronic diastolic CHF (congestive heart failure) ([place]) 04/27/2016  . Diastolic CHF ([place]) 42/35/3614  . Rigidity 06/23/2015  . Osteoarthritis of right knee 05/19/2015  . Dysphagia, pharyngoesophageal phase 09/05/2014  . Protein-calorie malnutrition ([place]) 08/09/2014  . Hyperlipidemia 06/03/2014  . CKD (chronic kidney disease) stage 3, GFR 30-59 ml/min 06/03/2014  . Dementia with behavioral disturbance 12/29/2013  . Skin lesion of face 03/30/2013  . Osteoarthritis of spine 03/10/2013  . Chronic venous insufficiency 01/12/2013  . Edema of both legs 01/12/2013  . Long term current use of anticoagulant therapy 11/23/2012  . Alzheimer's disease   . Atrial fibrillation ([place])   . BPH (benign prostatic hyperplasia)   . Gait disorder     Orientation RESPIRATION BLADDER Height & Weight     Self  Normal Incontinent Weight: 198 lb 9.6 oz (90.1 kg) Height:     BEHAVIORAL SYMPTOMS/MOOD NEUROLOGICAL BOWEL NUTRITION STATUS      Incontinent Diet (DYS 1, Nectar Thick)  AMBULATORY STATUS COMMUNICATION OF NEEDS Skin   Total Care Verbally Normal                        Personal Care Assistance Level of Assistance  Total care, Bathing, Feeding, Dressing Bathing Assistance: Maximum assistance Feeding assistance: Maximum assistance Dressing Assistance: Maximum assistance Total Care Assistance: Maximum assistance   Functional Limitations Info  Sight, Hearing, Speech Sight Info: Impaired Hearing Info: Impaired Speech Info: Impaired    SPECIAL CARE FACTORS FREQUENCY                       Contractures Contractures Info: Not present    Additional Factors Info  Code Status, Allergies Code Status Info: DNR Allergies Info:  Penicillins, Bee Venom, Risperidone And Related           Discharge Medications: Please see discharge summary for a list of discharge medications.  Current Discharge Medication List        START taking these medications   Details  furosemide (LASIX) 40 MG tablet Take 1 tablet (40 mg total) by mouth every other day. Qty: 30 tablet, Refills: 0          CONTINUE these medications which have CHANGED   Details  apixaban (ELIQUIS) 5 MG TABS tablet Take 1 tablet (5 mg total) by mouth 2 (two) times daily. Qty: 60 tablet, Refills: 1    potassium chloride SA (K-DUR,KLOR-CON) 20 MEQ tablet Take 2 tablets (40 mEq total) by mouth 2 (two) times daily. Qty: 8 tablet, Refills: 0          CONTINUE  these medications which have NOT CHANGED   Details  ciclopirox (LOPROX) 0.77 % cream Apply topically 2 (two) times daily as needed (redness).    ipratropium-albuterol (DUONEB) 0.5-2.5 (3) MG/3ML SOLN Take 3 mLs by nebulization every 6 (six) hours as needed (for wheezing and cough).     nitroGLYCERIN (NITROSTAT) 0.4 MG SL tablet Place 0.4 mg under the tongue every 5 (five) minutes as needed for chest pain.    polyethylene glycol (MIRALAX / GLYCOLAX) packet Take 17 g by mouth daily.    tamsulosin (FLOMAX) 0.4 MG CAPS capsule Take 0.4 mg by mouth at bedtime. Hold for SBP < 95         STOP taking these  medications     lactose free nutrition (BOOST PLUS) LIQD      zolpidem (AMBIEN) 5 MG tablet          Relevant Imaging Results:  Relevant Lab Results:   Additional Information SSN:  [id number]  Darden Dates, LCSW

## 2016-04-29 ENCOUNTER — Non-Acute Institutional Stay (SKILLED_NURSING_FACILITY): Payer: Medicare Other | Admitting: Adult Health

## 2016-04-29 ENCOUNTER — Encounter: Payer: Self-pay | Admitting: Adult Health

## 2016-04-29 DIAGNOSIS — K5901 Slow transit constipation: Secondary | ICD-10-CM | POA: Diagnosis not present

## 2016-04-29 DIAGNOSIS — Z7189 Other specified counseling: Secondary | ICD-10-CM | POA: Diagnosis not present

## 2016-04-29 DIAGNOSIS — Z229 Carrier of infectious disease, unspecified: Secondary | ICD-10-CM

## 2016-04-29 DIAGNOSIS — I5032 Chronic diastolic (congestive) heart failure: Secondary | ICD-10-CM | POA: Diagnosis not present

## 2016-04-29 DIAGNOSIS — R627 Adult failure to thrive: Secondary | ICD-10-CM | POA: Diagnosis not present

## 2016-04-29 DIAGNOSIS — E860 Dehydration: Secondary | ICD-10-CM | POA: Diagnosis not present

## 2016-04-29 LAB — URINE CULTURE
Culture: >=100000 — AB
Special Requests: NORMAL

## 2016-04-29 NOTE — Progress Notes (Signed)
Patient ID: Bryan [name], male   DOB: 1923-02-13, 82 y.o.   MRN: [id number]  Location:  [place]:  SNF (31) Provider:   Cindi Carbon, ANP [place] [phone]   [name], [name], DO  Patient Care Team: [name], DO as PCP - General (Geriatric Medicine) Well [place] Proper, NP as Nurse Practitioner (Nurse Practitioner)  Extended Emergency Contact Information Primary Emergency Contact: Bryan [name] Address: [address]          [place], [place] [address] [place] Phone: [phone] Relation: Spouse Secondary Emergency Contact: Bryan [name], [place] Phone: [phone] Relation: Daughter  Code Status:  DNR Goals of care: Advanced Directive information Advanced Directives 04/29/2016  Does patient have an advance directive? Yes  Type of Advance Directive Out of facility DNR (pink MOST or yellow form)  Does patient want to make changes to advanced directive? -  Copy of advanced directive(s) in chart? Yes  Pre-existing out of facility DNR order (yellow form or pink MOST form) Pink MOST form placed in chart (order not valid for inpatient use);Yellow form placed in chart (order not valid for inpatient use)     Chief Complaint  Patient presents with  . Acute Visit    f/u er visit, low urine output    HPI:  Pt is a 82 y.o. male seen today for an acute visit for follow up after an ER visit with over night stay due to low urine out put, and mild CHF noted on CXR. He was diuresed with IV lasix and put out 3700.  His lasix was on hold prior to that due to low BP, poor urine output, and poor intake. CXR showed only mild CHF.  Weights at the facility have been difficult to monitor due to inaccuracies.  He has been sent to the ER 3 x by the staff over the past few months. Once on 8/15 due to resp difficulty with no  new orders. Once on 10/13 for low urine out put and then again on 10/21 for low urine out put. His catheter is functioning patent.  He was treated for a UTI on 10/9 with cipro.  Urine culture on done on 10/21 in the ER showed >100,000 colonies of Enterococcus.  He has been asymptomatic.   WBC 8.6 on 10/22. He has a hx of diastolic CHF with preserved EF noted on echo in 2015.  Also has a hx of venous insuff with compression hose in place but chronically has BLE edema.   There was also concern for abd distention.  KUB on 10/22 showed mild distended small bowel suspicious for ileus.  He continues to have bowel movements, no further distention, eating, no fever, or vomiting. He does have trouble passing gas and maintaining a normal regimen chronically.  Unfortunately now is NA is 148 and his urine output is dropping again, currently 100 out on nights and 100 out on days. He took in 480 at breakfast and 220 at lunch.  His weight is down to 201 lbs (was running 208-210, then later bumped up to 221 but this may have been an inaccurate weight)  Generally speaking he had advanced dementia and has overall poor intake making it difficult to maintain a fluid balance.  His daughter is here today and would like to avoid unnecessary hospitalizations.  Past Medical History:  Diagnosis Date  . Allergic rhinitis   . Alzheimer's disease 2013  . Atrial fibrillation ([place])    Long term anti coagulation w/ warfarin fro stroke risk reduction  . BPH (benign prostatic hyperplasia)   . Chronic venous insufficiency 01/12/2013  . Dementia with behavioral disturbance 12/29/13  . Gait disorder   . GERD (gastroesophageal reflux disease)   . History of CVA (cerebrovascular accident) 2001   Rt.MCA  . Hyperlipidemia   . Lactose intolerance   . Long term (current) use of anticoagulants 11/23/2012   Long-term anticoagulation with warfarin for stroke risk reduction related to AF     Past Surgical History:  Procedure Laterality Date  . CATARACT EXTRACTION W/ INTRAOCULAR LENS  IMPLANT, BILATERAL    . HERNIA REPAIR Right 1970s  . KNEE SURGERY Right 2000  . MASS EXCISION Left 07/30/2013   Procedure: EXCISION OF BASAL CELL CARCINOMA  LEFT FOREHEAD ;  Surgeon: Irene Limbo, MD;  Location: [facility];  Service: Plastics;  Laterality: Left;  . RETINAL DETACHMENT SURGERY Right 2009    Allergies  Allergen Reactions  . Penicillins Rash  . Bee Venom   . Risperidone And Related     Unstable gait      Medication List       Accurate as of 04/29/16 11:24 AM. Always use your most recent med list.          apixaban 5 MG Tabs tablet Commonly known as:  ELIQUIS Take 1 tablet (5 mg total) by mouth 2 (two) times daily.   ciclopirox 0.77 % cream Commonly known as:  LOPROX Apply topically 2 (two) times daily as needed (redness).   furosemide 40 MG tablet Commonly known as:  LASIX Take 1 tablet (40 mg total) by mouth every other day.   ipratropium-albuterol 0.5-2.5 (3) MG/3ML Soln Commonly known as:  DUONEB Take 3 mLs by nebulization every 6 (six) hours as needed (for wheezing and cough).   nitroGLYCERIN 0.4 MG SL tablet Commonly known as:  NITROSTAT Place 0.4 mg under the tongue every 5 (five) minutes as needed for chest pain.   polyethylene glycol packet Commonly known as:  MIRALAX / GLYCOLAX Take 17 g by mouth daily.   potassium chloride SA 20 MEQ tablet Commonly known as:  K-DUR,KLOR-CON Take 2 tablets (40 mEq total) by mouth 2 (two) times daily.   tamsulosin 0.4 MG Caps capsule Commonly known as:  FLOMAX Take 0.4 mg by mouth at bedtime. Hold for SBP < 95       Review of Systems  Unable to perform ROS: Dementia    Immunization History  Administered Date(s) Administered  . Influenza Whole 04/20/2013  . Influenza-Unspecified 04/12/2014, 04/20/2015  . PPD Test 11/29/2012   Pertinent  Health Maintenance Due  Topic Date Due   . PNA vac Low Risk Adult (1 of 2 - PCV13) 06/08/1990  . INFLUENZA VACCINE  02/06/2016   No flowsheet data found. Functional Status Survey:    Vitals:   04/29/16 1117  BP: 93/60  Pulse: 72  Resp: 18  Temp: 98.5 F (36.9 C)  SpO2: 97%  Weight: 201 lb (91.2 kg)   Body mass index is 30.24 kg/m. Physical Exam  Constitutional: No distress.  HENT:  Head: Normocephalic and atraumatic.  Nose: Nose normal.  Mouth/Throat: Oropharynx is clear and moist.  Eyes: Conjunctivae are normal. Pupils are equal, round, and reactive to light. Right eye exhibits no discharge. Left eye exhibits no discharge.  Neck:  No JVD present.  Cardiovascular: Normal rate.   No murmur heard. BLE edema +2  Pulmonary/Chest: Effort normal and breath sounds normal. No respiratory distress.  Abdominal: Soft. Bowel sounds are normal. He exhibits no distension. There is no tenderness.  Genitourinary:  Genitourinary Comments: Catheter with yellow urine present  Neurological: He is alert.  Mumbles words, not able to f/c  Skin: Skin is warm and dry. He is not diaphoretic.  Psychiatric: He has a normal mood and affect.    Labs reviewed:  Recent Labs  04/19/16 0309  04/27/16 0110 04/27/16 0128 04/27/16 1020 04/28/16 0226  NA 142  < > 141 145  --  142  K 3.6  < > 3.6 3.9  --  2.9*  CL 111  --  115* 115*  --  111  CO2 23  --  20*  --   --  24  GLUCOSE 114*  --  122* 112*  --  105*  BUN 25*  < > 22* 31*  --  19  CREATININE 1.18  < > 1.11 1.10  --  1.28*  CALCIUM 8.4*  --  8.3*  --   --  8.4*  MG  --   --   --   --  2.0  --   < > = values in this interval not displayed.  Recent Labs  04/19/16 0309 04/27/16 0110 04/28/16 0226  AST 17 14* 17  ALT 13* 10* 10*  ALKPHOS 75 78 81  BILITOT 0.3 0.7 1.2  PROT 6.7 6.3* 6.3*  ALBUMIN 2.7* 2.6* 2.6*    Recent Labs  02/20/16 2355  04/19/16 0309 04/27/16 0110 04/27/16 0128 04/28/16 0226  WBC 10.1  < > 8.6 9.7  --  8.6  NEUTROABS 7.3  --  5.5 6.8  --    --   HGB 13.0  < > 12.4* 11.8* 11.9* 12.0*  HCT 40.5  < > 38.5* 36.8* 35.0* 37.2*  MCV 94.6  --  92.3 92.7  --  91.4  PLT 214  < > 313 332  --  346  < > = values in this interval not displayed. Lab Results  Component Value Date   TSH 3.83 01/29/2016   No results found for: HGBA1C Lab Results  Component Value Date   CHOL 155 08/31/2015   HDL 33 (A) 08/31/2015   LDLCALC 99 08/31/2015   TRIG 116 08/31/2015    Significant Diagnostic Results in last 30 days:  Dg Chest 1 View  Result Date: 04/27/2016 CLINICAL DATA:  Urinary retention.  Cough.  History dementia, CHF. EXAM: CHEST 1 VIEW COMPARISON:  Chest radiograph February 20, 2016 FINDINGS: The cardiac silhouette is mildly enlarged, mediastinal silhouette is nonsuspicious. Calcified aortic knob. Bibasilar strandy densities. Mild pulmonary vascular congestion without pleural effusion or focal consolidation. No pneumothorax. Osteopenia. IMPRESSION: Mild cardiomegaly and pulmonary vascular congestion. Bibasilar atelectasis. Electronically Signed   By: Elon Alas M.D.   On: 04/27/2016 01:58   Dg Abd Portable 1v  Result Date: 04/28/2016 CLINICAL DATA:  Chronic abdominal pain EXAM: PORTABLE ABDOMEN - 1 VIEW COMPARISON:  02/20/2011 FINDINGS: Mild gaseous distended small bowel loops are noted mid abdomen suspicious for ileus, enteritis or early bowel obstruction. Degenerative changes and mild levoscoliosis lumbar spine. Some colonic gas noted in transverse colon. IMPRESSION: Mild gaseous distended small bowel loops mid abdomen suspicious for ileus, enteritis or early bowel obstruction. Electronically Signed   By: Lahoma Crocker M.D.   On: 04/28/2016 10:31  Assessment/Plan  1. Chronic diastolic congestive heart failure (HCC) Stable but over diuresed at this point Hold lasix today and tomorrow due to NA of 148 then recheck BMP and encourage p.o. Intake  2. FTT (failure to thrive) in adult Progressive weight loss, dementia, and poor  intake Continue to monitor intake and encourage fluids Continue supplements  3. Slow transit constipation Continue miralax, see plan below formulated for staff  4. Colonization status Noted positive urine in the ED. Resident has no fever, white count, pain, discomfort, or other signs to meet criteria for treatment of UTI.  He is at risk for cdiff due to recent treatment for UTI.  If he becomes symptomatic using [place] criteria, then would treat for UTI.    5. Advanced care planning I had a long discussion with his daughter Bryan [name].  We discussed that he has progressive dementia and poor intake and that he will not improve. She understands this and would like to avoid hospitalizations that are not necessary but is not ready to have an order stating "no hospitalizations".  Mr. Doo remains in a tenuous situation as he does not take in enough fluid daily but needs diuresis at times due to volume overload. He is very sensitive to diuresis and should not be aggressively diuresed.  We agreed to formulate a plan as listed below for the staff at [facility] to help prevent trips to the ED.  Abd distention: If he exhibits this please put him in bed and put him on his left side.  If he is already on his left side then change to the right in an attempt to release air.   Perform digital exam to look for impaction.  If noted give Dulcolax supp or if no BM in 2-3 days.   If he continues to show signs of distention please notify [facility] for additional orders (i.e. KUB) Distention with fever, abd pain, change in VS, and/or vomiting are serious cause for concern.  Distention from [name] of passing gas and and/or constipation are issues we can address at [facility].  Fluid management We will monitor his weight daily using the same scale and same location.  If the weight trends upward by more than 3 lbs, [facility] will be notified for additional orders. Monitor calf circumferences daily and report changes in  measurements. Please know that low urine out put on night shift is not unusal for him (50-100cc). If low urine output is noted (<200), flush the foley and encourage fluids. Notify [facility] if the urine output is low for day/evening shift (<200cc).  When calling please let them know his most recent labs and recent orders in regards to diuretics so adjustments can be made.   Please report low 02 sats, increased work of breathing, or change weights/edema to [facility].  Lasix can be given p.o. or IM to help with these issues.   Continue to monitor/record his intake and output in the records.  These issues should be addressed during the day so that Dr. Mariea Clonts and I can address them, as we are familiar with him and know his family's wishes.    Family/ staff Communication: discussed with staff and resident's daughter Bryan [name] ordered:  BMP on Wednesday 10/25 and weekly

## 2016-04-30 ENCOUNTER — Encounter: Payer: Self-pay | Admitting: Internal Medicine

## 2016-04-30 DIAGNOSIS — K5900 Constipation, unspecified: Secondary | ICD-10-CM | POA: Insufficient documentation

## 2016-04-30 DIAGNOSIS — R627 Adult failure to thrive: Secondary | ICD-10-CM | POA: Insufficient documentation

## 2016-05-01 DIAGNOSIS — I5032 Chronic diastolic (congestive) heart failure: Secondary | ICD-10-CM | POA: Diagnosis not present

## 2016-05-01 DIAGNOSIS — N183 Chronic kidney disease, stage 3 (moderate): Secondary | ICD-10-CM | POA: Diagnosis not present

## 2016-05-01 DIAGNOSIS — E860 Dehydration: Secondary | ICD-10-CM | POA: Diagnosis not present

## 2016-05-01 DIAGNOSIS — E870 Hyperosmolality and hypernatremia: Secondary | ICD-10-CM | POA: Diagnosis not present

## 2016-05-03 DIAGNOSIS — R339 Retention of urine, unspecified: Secondary | ICD-10-CM | POA: Diagnosis not present

## 2016-05-03 DIAGNOSIS — I5032 Chronic diastolic (congestive) heart failure: Secondary | ICD-10-CM | POA: Diagnosis not present

## 2016-05-06 ENCOUNTER — Non-Acute Institutional Stay (SKILLED_NURSING_FACILITY): Payer: Medicare Other | Admitting: Adult Health

## 2016-05-06 DIAGNOSIS — E440 Moderate protein-calorie malnutrition: Secondary | ICD-10-CM

## 2016-05-06 DIAGNOSIS — R34 Anuria and oliguria: Secondary | ICD-10-CM

## 2016-05-06 DIAGNOSIS — I5032 Chronic diastolic (congestive) heart failure: Secondary | ICD-10-CM | POA: Diagnosis not present

## 2016-05-06 DIAGNOSIS — R339 Retention of urine, unspecified: Secondary | ICD-10-CM | POA: Diagnosis not present

## 2016-05-08 ENCOUNTER — Encounter: Payer: Self-pay | Admitting: Adult Health

## 2016-05-08 NOTE — Progress Notes (Signed)
Patient ID: Bryan [name], male   DOB: 1922-07-30, 82 y.o.   MRN: [id number]  Location:  [facility]:  SNF (31) Provider:   Cindi Carbon, ANP [place] [phone]   [name], [name], DO  Patient Care Team: [name], DO as PCP - General (Geriatric Medicine) Well [place], NP as Nurse Practitioner (Nurse Practitioner)  Extended Emergency Contact Information Primary Emergency Contact: [name] Address: [address]          [place], [place] [address] [place] Phone: [phone] Relation: Spouse Secondary Emergency Contact: [name], [place] Phone: [phone] Relation: Daughter  Code Status:  DNR Goals of care: Advanced Directive information Advanced Directives 04/29/2016  Does patient have an advance directive? Yes  Type of Advance Directive Out of facility DNR (pink MOST or yellow form)  Does patient want to make changes to advanced directive? -  Copy of advanced directive(s) in chart? Yes  Pre-existing out of facility DNR order (yellow form or pink MOST form) Pink MOST form placed in chart (order not valid for inpatient use);Yellow form placed in chart (order not valid for inpatient use)     Chief Complaint  Patient presents with  . Acute Visit    low output, edema    HPI:  Pt is a 82 y.o. male seen today for an acute visit for follow up after a call was placed over the weekend to the [facility] oncall provider due to low output and weight gain.  His weight today is 212 lbs, up 2 lbs from the day prior. He was given Lasix 20 mg on 10/29. He has no SOB, hypoxia, or other signs of fluid overload. However, he goes into overload easily and was recently sent to the ER for this reason. Also has propensity to develop hypernatremia from diuretics.  He has a caregiver during the day and resides in memory  care due severe dementia.  His albumin is low at 2.6 lending him towards an edematous state.  He also had venous insuff and wears compression hose.   He has a hx of diastolic CHF with preserved EF noted on echo in 2015.  His I and O's have been running positive but its difficult to calculate the balance as some of the outputs were labeled "large" in matrix, rather than an actual number.   His intake appears WNL, goals 1200-2000 cc per nutritionist.   Past Medical History:  Diagnosis Date  . Allergic rhinitis   . Alzheimer's disease 2013  . Atrial fibrillation ([place])    Long term anti coagulation w/ warfarin fro stroke risk reduction  . BPH (benign prostatic hyperplasia)   . Chronic venous insufficiency 01/12/2013  . Dementia with behavioral disturbance 12/29/13  . Gait disorder   . GERD (gastroesophageal reflux disease)   . History of CVA (cerebrovascular accident) 2001   Rt.MCA  . Hyperlipidemia   . Lactose intolerance   . Long term (current) use of anticoagulants 11/23/2012   Long-term anticoagulation with warfarin for stroke risk reduction related to AF   Past Surgical History:  Procedure Laterality Date  . CATARACT EXTRACTION W/ INTRAOCULAR LENS  IMPLANT, BILATERAL    . HERNIA REPAIR Right 1970s  . KNEE SURGERY Right 2000  . MASS EXCISION Left 07/30/2013   Procedure: EXCISION OF BASAL CELL CARCINOMA  LEFT FOREHEAD ;  Surgeon: Irene Limbo, MD;  Location: [facility];  Service: Plastics;  Laterality: Left;  . RETINAL DETACHMENT SURGERY Right 2009    Allergies  Allergen Reactions  . Penicillins Rash  . Bee Venom   . Risperidone And Related     Unstable gait      Medication List       Accurate as of 05/06/16 11:59 PM. Always use your most recent med list.          apixaban 5 MG Tabs tablet Commonly known as:  ELIQUIS Take 1 tablet (5 mg total) by mouth 2 (two) times daily.   ciclopirox 0.77 % cream Commonly known as:  LOPROX Apply topically 2 (two)  times daily as needed (redness).   ipratropium-albuterol 0.5-2.5 (3) MG/3ML Soln Commonly known as:  DUONEB Take 3 mLs by nebulization every 6 (six) hours as needed (for wheezing and cough).   nitroGLYCERIN 0.4 MG SL tablet Commonly known as:  NITROSTAT Place 0.4 mg under the tongue every 5 (five) minutes as needed for chest pain.   polyethylene glycol packet Commonly known as:  MIRALAX / GLYCOLAX Take 17 g by mouth daily.   potassium chloride SA 20 MEQ tablet Commonly known as:  K-DUR,KLOR-CON Take 2 tablets (40 mEq total) by mouth 2 (two) times daily.   spironolactone 25 MG tablet Commonly known as:  ALDACTONE Take 12.5 mg by mouth daily.   tamsulosin 0.4 MG Caps capsule Commonly known as:  FLOMAX Take 0.4 mg by mouth at bedtime. Hold for SBP < 95       Review of Systems  Unable to perform ROS: Dementia    Immunization History  Administered Date(s) Administered  . Influenza Inj Mdck Quad Pf 04/25/2016  . Influenza Whole 04/20/2013  . Influenza-Unspecified 04/12/2014, 04/20/2015  . PPD Test 11/29/2012   Pertinent  Health Maintenance Due  Topic Date Due  . PNA vac Low Risk Adult (1 of 2 - PCV13) 06/08/1990  . INFLUENZA VACCINE  Completed   No flowsheet data found. Functional Status Survey:    Vitals:   05/08/16 1414  BP: 130/75  Pulse: 70  Resp: 20  Temp: (!) 96.5 F (35.8 C)  SpO2: 95%  Weight: 212 lb (96.2 kg)   Body mass index is 28.75 kg/m. Physical Exam  Constitutional: No distress.  HENT:  Head: Normocephalic and atraumatic.  Nose: Nose normal.  Mouth/Throat: Oropharynx is clear and moist.  Eyes: Conjunctivae are normal. Pupils are equal, round, and reactive to light. Right eye exhibits no discharge. Left eye exhibits no discharge.  Neck: No JVD present.  Cardiovascular: Normal rate.   No murmur heard. BLE edema +2  Pulmonary/Chest: Effort normal and breath sounds normal. No respiratory distress.  Abdominal: Soft. Bowel sounds are normal.  He exhibits no distension. There is no tenderness.  Genitourinary:  Genitourinary Comments: Catheter with yellow urine present  Neurological: He is alert.  Mumbles words, not able to f/c  Skin: Skin is warm and dry. He is not diaphoretic.  Psychiatric: He has a normal mood and affect.    Labs reviewed:  Recent Labs  04/19/16 0309  04/27/16 0110 04/27/16 0128 04/27/16 1020 04/28/16 0226  NA 142  < > 141 145  --  142  K 3.6  < > 3.6 3.9  --  2.9*  CL 111  --  115* 115*  --  111  CO2 23  --  20*  --   --  24  GLUCOSE 114*  --  122* 112*  --  105*  BUN 25*  < > 22* 31*  --  19  CREATININE 1.18  < > 1.11 1.10  --  1.28*  CALCIUM 8.4*  --  8.3*  --   --  8.4*  MG  --   --   --   --  2.0  --   < > = values in this interval not displayed.  Recent Labs  04/19/16 0309 04/27/16 0110 04/28/16 0226  AST 17 14* 17  ALT 13* 10* 10*  ALKPHOS 75 78 81  BILITOT 0.3 0.7 1.2  PROT 6.7 6.3* 6.3*  ALBUMIN 2.7* 2.6* 2.6*    Recent Labs  02/20/16 2355  04/19/16 0309 04/27/16 0110 04/27/16 0128 04/28/16 0226  WBC 10.1  < > 8.6 9.7  --  8.6  NEUTROABS 7.3  --  5.5 6.8  --   --   HGB 13.0  < > 12.4* 11.8* 11.9* 12.0*  HCT 40.5  < > 38.5* 36.8* 35.0* 37.2*  MCV 94.6  --  92.3 92.7  --  91.4  PLT 214  < > 313 332  --  346  < > = values in this interval not displayed. Lab Results  Component Value Date   TSH 3.83 01/29/2016   No results found for: HGBA1C Lab Results  Component Value Date   CHOL 155 08/31/2015   HDL 33 (A) 08/31/2015   LDLCALC 99 08/31/2015   TRIG 116 08/31/2015    Significant Diagnostic Results in last 30 days:  Dg Chest 1 View  Result Date: 04/27/2016 CLINICAL DATA:  Urinary retention.  Cough.  History dementia, CHF. EXAM: CHEST 1 VIEW COMPARISON:  Chest radiograph February 20, 2016 FINDINGS: The cardiac silhouette is mildly enlarged, mediastinal silhouette is nonsuspicious. Calcified aortic knob. Bibasilar strandy densities. Mild pulmonary vascular  congestion without pleural effusion or focal consolidation. No pneumothorax. Osteopenia. IMPRESSION: Mild cardiomegaly and pulmonary vascular congestion. Bibasilar atelectasis. Electronically Signed   By: Elon Alas M.D.   On: 04/27/2016 01:58   Dg Abd Portable 1v  Result Date: 04/28/2016 CLINICAL DATA:  Chronic abdominal pain EXAM: PORTABLE ABDOMEN - 1 VIEW COMPARISON:  02/20/2011 FINDINGS: Mild gaseous distended small bowel loops are noted mid abdomen suspicious for ileus, enteritis or early bowel obstruction. Degenerative changes and mild levoscoliosis lumbar spine. Some colonic gas noted in transverse colon. IMPRESSION: Mild gaseous distended small bowel loops mid abdomen suspicious for ileus, enteritis or early bowel obstruction. Electronically Signed   By: Lahoma Crocker M.D.   On: 04/28/2016 10:31    Assessment/Plan 1. Chronic diastolic congestive heart failure ([place]) Does not appear to be in an acute state, but weight is trending up.  Some weight gain expected as he was very dry upon returning from the hospital with hypernatremia Increase aldactone to 25 mg qd One dose lasix 40 mg now, hold K dur BMP Thursday Monitor weights, I and O's  2. Low urine out put -due to low albumin, venous insuff, and #1  3. Moderate protein malnutrition -Continue Boost plus 1 can BID  Family/ staff Communication: discussed with staff/nsg supervisor/resident's son via email [name]  Labs/tests ordered:  BMP weekly on Thursday

## 2016-05-09 DIAGNOSIS — R339 Retention of urine, unspecified: Secondary | ICD-10-CM | POA: Diagnosis not present

## 2016-05-09 DIAGNOSIS — I5032 Chronic diastolic (congestive) heart failure: Secondary | ICD-10-CM | POA: Diagnosis not present

## 2016-05-13 DIAGNOSIS — I5032 Chronic diastolic (congestive) heart failure: Secondary | ICD-10-CM | POA: Diagnosis not present

## 2016-05-13 DIAGNOSIS — R339 Retention of urine, unspecified: Secondary | ICD-10-CM | POA: Diagnosis not present

## 2016-05-16 DIAGNOSIS — R339 Retention of urine, unspecified: Secondary | ICD-10-CM | POA: Diagnosis not present

## 2016-05-16 DIAGNOSIS — I5032 Chronic diastolic (congestive) heart failure: Secondary | ICD-10-CM | POA: Diagnosis not present

## 2016-05-16 LAB — BASIC METABOLIC PANEL
BUN: 16 mg/dL (ref 4–21)
Creatinine: 1.1 mg/dL (ref 0.6–1.3)
Glucose: 103 mg/dL
Potassium: 3.6 mmol/L (ref 3.4–5.3)
SODIUM: 140 mmol/L (ref 137–147)

## 2016-05-20 ENCOUNTER — Encounter: Payer: Self-pay | Admitting: Adult Health

## 2016-05-20 ENCOUNTER — Non-Acute Institutional Stay (SKILLED_NURSING_FACILITY): Payer: Medicare Other | Admitting: Adult Health

## 2016-05-20 DIAGNOSIS — R339 Retention of urine, unspecified: Secondary | ICD-10-CM

## 2016-05-20 DIAGNOSIS — I5032 Chronic diastolic (congestive) heart failure: Secondary | ICD-10-CM

## 2016-05-20 DIAGNOSIS — N183 Chronic kidney disease, stage 3 unspecified: Secondary | ICD-10-CM

## 2016-05-20 DIAGNOSIS — I482 Chronic atrial fibrillation, unspecified: Secondary | ICD-10-CM

## 2016-05-20 DIAGNOSIS — R1314 Dysphagia, pharyngoesophageal phase: Secondary | ICD-10-CM

## 2016-05-20 DIAGNOSIS — F0391 Unspecified dementia with behavioral disturbance: Secondary | ICD-10-CM | POA: Diagnosis not present

## 2016-05-20 NOTE — Progress Notes (Signed)
Patient ID: Bryan [name], male   DOB: 04-26-1923, 82 y.o.   MRN: [id number]  Location:  [place]:  SNF (31) Provider:   Cindi Carbon, ANP [address] [phone]   [name], [name], DO  Patient Care Team: [name], DO as PCP - General (Geriatric Medicine) Well [place], NP as Nurse Practitioner (Nurse Practitioner)  Extended Emergency Contact Information Primary Emergency Contact: [name] Address: [address]          [place], [place] [address] [place] Phone: [phone] Relation: Spouse Secondary Emergency Contact: [name], [place] Phone: [phone] Relation: Daughter  Code Status:  DNR Goals of care: Advanced Directive information Advanced Directives 05/20/2016  Does patient have an advance directive? Yes  Type of Advance Directive Out of facility DNR (pink MOST or yellow form)  Does patient want to make changes to advanced directive? -  Copy of advanced directive(s) in chart? Yes  Pre-existing out of facility DNR order (yellow form or pink MOST form) Pink MOST form placed in chart (order not valid for inpatient use);Yellow form placed in chart (order not valid for inpatient use)     Chief Complaint  Patient presents with  . Medical Management of Chronic Issues    HPI:  Pt is a 82 y.o. male seen today for medical management of chronic diseases.  Resides in memory care due to dementia.  Has caregivers 1:1 during the day.  No complaints regarding his care. VS stable.  Non ambulatory and minimally verbal.  1. Dementia with behavioral disturbance, unspecified dementia type Remains alert and mumbles words, no longer on meds due to lack of benefit Has periods of agitation during personal care which is not new  2. Chronic atrial fibrillation (HCC) Rate is controlled without  meds Currently on eliquis for CVA risk reduction, H/H stable  3. Urinary retention Due to BPH, has foley cath On flomax to help with cath insertion  4. Chronic diastolic congestive heart failure (HCC) Weight stable at 211 lbs from the day prior but up 2 lbs from last week. No sob or discomfort noted. Currently on aldactone and lasix Intake 720 today  And 250 out +485 previous 24 hrs Calf measurements 13.5/13.5   5. Dysphagia, pharyngoesophageal phase  no coughing reported Puree NTL, fed for all meals  6. CKD (chronic kidney disease) stage 3, GFR 30-59 ml/min Cr Cl 59 BUN/CR 16/1.1   Past Medical History:  Diagnosis Date  . Allergic rhinitis   . Alzheimer's disease 2013  . Atrial fibrillation ([place])    Long term anti coagulation w/ warfarin fro stroke risk reduction  . BPH (benign prostatic hyperplasia)   . Chronic venous insufficiency 01/12/2013  . Dementia with behavioral disturbance 12/29/13  . Gait disorder   . GERD (gastroesophageal reflux disease)   . History of CVA (cerebrovascular accident) 2001   Rt.MCA  . Hyperlipidemia   . Lactose intolerance   . Long term (current) use of anticoagulants 11/23/2012   Long-term anticoagulation with warfarin for stroke risk reduction related to AF   Past Surgical History:  Procedure Laterality Date  . CATARACT EXTRACTION W/ INTRAOCULAR LENS  IMPLANT, BILATERAL    . HERNIA REPAIR Right 1970s  . KNEE SURGERY Right 2000  . MASS EXCISION Left 07/30/2013   Procedure: EXCISION OF BASAL CELL CARCINOMA  LEFT FOREHEAD ;  Surgeon: Irene Limbo, MD;  Location: [facility];  Service: Plastics;  Laterality: Left;  . RETINAL DETACHMENT SURGERY Right 2009    Allergies  Allergen Reactions  . Penicillins Rash  . Bee Venom   . Risperidone And Related     Unstable gait      Medication List       Accurate as of 05/20/16  3:40 PM. Always use your most recent med list.          apixaban 5 MG Tabs tablet Commonly known  as:  ELIQUIS Take 1 tablet (5 mg total) by mouth 2 (two) times daily.   ciclopirox 0.77 % cream Commonly known as:  LOPROX Apply topically 2 (two) times daily as needed (redness).   furosemide 20 MG tablet Commonly known as:  LASIX Take 20 mg by mouth daily.   ipratropium-albuterol 0.5-2.5 (3) MG/3ML Soln Commonly known as:  DUONEB Take 3 mLs by nebulization every 6 (six) hours as needed (for wheezing and cough).   nitroGLYCERIN 0.4 MG SL tablet Commonly known as:  NITROSTAT Place 0.4 mg under the tongue every 5 (five) minutes as needed for chest pain.   polyethylene glycol packet Commonly known as:  MIRALAX / GLYCOLAX Take 17 g by mouth daily.   potassium chloride 10 MEQ tablet Commonly known as:  K-DUR,KLOR-CON Take 10 mEq by mouth once.   potassium chloride SA 20 MEQ tablet Commonly known as:  K-DUR,KLOR-CON Take 2 tablets (40 mEq total) by mouth 2 (two) times daily.   spironolactone 25 MG tablet Commonly known as:  ALDACTONE Take 25 mg by mouth daily.   tamsulosin 0.4 MG Caps capsule Commonly known as:  FLOMAX Take 0.4 mg by mouth at bedtime. Hold for SBP < 95       Review of Systems  Unable to perform ROS: Dementia    Immunization History  Administered Date(s) Administered  . Influenza Inj Mdck Quad Pf 04/25/2016  . Influenza Whole 04/20/2013  . Influenza-Unspecified 04/12/2014, 04/20/2015  . PPD Test 11/29/2012   Pertinent  Health Maintenance Due  Topic Date Due  . PNA vac Low Risk Adult (1 of 2 - PCV13) 06/08/1990  . INFLUENZA VACCINE  Completed   No flowsheet data found. Functional Status Survey:    Vitals:   05/20/16 1530  BP: 112/61  Pulse: 73  Resp: 20  Temp: (!) 96.6 F (35.9 C)  SpO2: 96%  Weight: 211 lb (95.7 kg)   Body mass index is 28.62 kg/m. Physical Exam  Constitutional: No distress.  HENT:  Head: Normocephalic and atraumatic.  Nose: Nose normal.  Mouth/Throat: Oropharynx is clear and moist.  Eyes: Conjunctivae are  normal. Pupils are equal, round, and reactive to light. Right eye exhibits no discharge. Left eye exhibits no discharge.  Neck: No JVD present.  Cardiovascular: Normal rate.   No murmur heard. BLE edema +2  Pulmonary/Chest: Effort normal and breath sounds normal. No respiratory distress.  Abdominal: Soft. Bowel sounds are normal. He exhibits no distension. There is no tenderness.  Genitourinary: Penis normal. No penile tenderness.  Genitourinary Comments: Catheter with yellow urine, small amt of yellow drainage with odor to cath site  Neurological: He is alert.  Mumbles words, not able to f/c  Skin: Skin is warm and dry. He is not diaphoretic.  Psychiatric: He has a normal mood and affect.  Nursing note and vitals reviewed.   Labs reviewed:  Recent Labs  04/19/16 0309  04/27/16 0110 04/27/16 0128 04/27/16 1020 04/28/16  4166 05/16/16  NA 142  < > 141 145  --  142 140  K 3.6  < > 3.6 3.9  --  2.9* 3.6  CL 111  --  115* 115*  --  111  --   CO2 23  --  20*  --   --  24  --   GLUCOSE 114*  --  122* 112*  --  105*  --   BUN 25*  < > 22* 31*  --  19 16  CREATININE 1.18  < > 1.11 1.10  --  1.28* 1.1  CALCIUM 8.4*  --  8.3*  --   --  8.4*  --   MG  --   --   --   --  2.0  --   --   < > = values in this interval not displayed.  Recent Labs  04/19/16 0309 04/27/16 0110 04/28/16 0226  AST 17 14* 17  ALT 13* 10* 10*  ALKPHOS 75 78 81  BILITOT 0.3 0.7 1.2  PROT 6.7 6.3* 6.3*  ALBUMIN 2.7* 2.6* 2.6*    Recent Labs  02/20/16 2355  04/19/16 0309 04/27/16 0110 04/27/16 0128 04/28/16 0226  WBC 10.1  < > 8.6 9.7  --  8.6  NEUTROABS 7.3  --  5.5 6.8  --   --   HGB 13.0  < > 12.4* 11.8* 11.9* 12.0*  HCT 40.5  < > 38.5* 36.8* 35.0* 37.2*  MCV 94.6  --  92.3 92.7  --  91.4  PLT 214  < > 313 332  --  346  < > = values in this interval not displayed. Lab Results  Component Value Date   TSH 3.83 01/29/2016   No results found for: HGBA1C Lab Results  Component Value Date    CHOL 155 08/31/2015   HDL 33 (A) 08/31/2015   LDLCALC 99 08/31/2015   TRIG 116 08/31/2015    Significant Diagnostic Results in last 30 days:  Dg Chest 1 View  Result Date: 04/27/2016 CLINICAL DATA:  Urinary retention.  Cough.  History dementia, CHF. EXAM: CHEST 1 VIEW COMPARISON:  Chest radiograph February 20, 2016 FINDINGS: The cardiac silhouette is mildly enlarged, mediastinal silhouette is nonsuspicious. Calcified aortic knob. Bibasilar strandy densities. Mild pulmonary vascular congestion without pleural effusion or focal consolidation. No pneumothorax. Osteopenia. IMPRESSION: Mild cardiomegaly and pulmonary vascular congestion. Bibasilar atelectasis. Electronically Signed   By: Elon Alas M.D.   On: 04/27/2016 01:58   Dg Abd Portable 1v  Result Date: 04/28/2016 CLINICAL DATA:  Chronic abdominal pain EXAM: PORTABLE ABDOMEN - 1 VIEW COMPARISON:  02/20/2011 FINDINGS: Mild gaseous distended small bowel loops are noted mid abdomen suspicious for ileus, enteritis or early bowel obstruction. Degenerative changes and mild levoscoliosis lumbar spine. Some colonic gas noted in transverse colon. IMPRESSION: Mild gaseous distended small bowel loops mid abdomen suspicious for ileus, enteritis or early bowel obstruction. Electronically Signed   By: Lahoma Crocker M.D.   On: 04/28/2016 10:31   Assessment and Plan  1. Dementia with behavioral disturbance, unspecified dementia type No longer on meds for this issue Severe, needs assistance for all ADLs Monitor behaviors and the need for medication  2. Chronic atrial fibrillation (HCC) Stable Continue Eliquis 5 mg BID  3. Urinary retention Continue flomax  Monitor out put Cath care qshift  4. Chronic diastolic congestive heart failure (HCC) Weight up by 2 lbs, no symptoms Continue Lasix 20 mg qd and Aldactone 25 mg qd Monitor  labs, weight, intake and output which tends to run positive   5. Dysphagia, pharyngoesophageal phase Continue Puree  NTL  6. CKD (chronic kidney disease) stage 3, GFR 30-59 ml/min Stable, continue to monitor  7. Protein cal malnutrition Continue Boost BID   Family/ staff Communication: discussed with staff  Labs/tests ordered:  BMP weekly on Thursday

## 2016-05-20 NOTE — Assessment & Plan Note (Signed)
Prostate nodule noted on exam 2017

## 2016-05-23 DIAGNOSIS — I5032 Chronic diastolic (congestive) heart failure: Secondary | ICD-10-CM | POA: Diagnosis not present

## 2016-05-23 DIAGNOSIS — R339 Retention of urine, unspecified: Secondary | ICD-10-CM | POA: Diagnosis not present

## 2016-05-23 LAB — BASIC METABOLIC PANEL
BUN: 19 mg/dL (ref 4–21)
CREATININE: 1.1 mg/dL (ref 0.6–1.3)
GLUCOSE: 109 mg/dL
POTASSIUM: 3.8 mmol/L (ref 3.4–5.3)
Sodium: 142 mmol/L (ref 137–147)

## 2016-05-27 ENCOUNTER — Encounter: Payer: Self-pay | Admitting: Adult Health

## 2016-05-27 ENCOUNTER — Non-Acute Institutional Stay (SKILLED_NURSING_FACILITY): Payer: Medicare Other | Admitting: Adult Health

## 2016-05-27 DIAGNOSIS — L089 Local infection of the skin and subcutaneous tissue, unspecified: Secondary | ICD-10-CM | POA: Diagnosis not present

## 2016-05-27 DIAGNOSIS — F0391 Unspecified dementia with behavioral disturbance: Secondary | ICD-10-CM

## 2016-05-27 DIAGNOSIS — T148XXA Other injury of unspecified body region, initial encounter: Secondary | ICD-10-CM | POA: Diagnosis not present

## 2016-05-27 NOTE — Progress Notes (Signed)
Patient ID: Bryan [name], male   DOB: 08/06/1922, 82 y.o.   MRN: [id number]  Location:  [facility]:  SNF (31) Provider:   Cindi Carbon, ANP [place] [phone]   [name], [name], DO  Patient Care Team: [name], DO as PCP - General (Geriatric Medicine) Well [place], NP as Nurse Practitioner (Nurse Practitioner)  Extended Emergency Contact Information Primary Emergency Contact: [name] Address: [address]          [place], [place] [address] [place] Phone: [phone] Relation: Spouse Secondary Emergency Contact: [name], [place] Phone: [phone] Relation: Daughter  Code Status:  DNR Goals of care: Advanced Directive information Advanced Directives 05/20/2016  Does patient have an advance directive? Yes  Type of Advance Directive Out of facility DNR (pink MOST or yellow form)  Does patient want to make changes to advanced directive? -  Copy of advanced directive(s) in chart? Yes  Pre-existing out of facility DNR order (yellow form or pink MOST form) Pink MOST form placed in chart (order not valid for inpatient use);Yellow form placed in chart (order not valid for inpatient use)     Chief Complaint  Patient presents with  . Acute Visit    Re-check skin tear    HPI:  Pt is a 82 y.o. male seen today for an acute visit for recheck of skin tear to R forearm. Pt sustained a skin tear on 10/31 while being transferred by [facility] lift to his chair. Staff reports that pt has become increasingly agitated during transfers. Bactroban to skin tear was started on 11/18. Wound has been dressed with non-adherent gauze. No purulent drainage observed today. Slight bleeding from wound where pt scratched area. Pt has been afebrile.    Past Medical History:  Diagnosis Date  . Allergic rhinitis   .  Alzheimer's disease 2013  . Atrial fibrillation ([place])    Long term anti coagulation w/ warfarin fro stroke risk reduction  . BPH (benign prostatic hyperplasia)   . Chronic venous insufficiency 01/12/2013  . Dementia with behavioral disturbance 12/29/13  . Gait disorder   . GERD (gastroesophageal reflux disease)   . History of CVA (cerebrovascular accident) 2001   Rt.MCA  . Hyperlipidemia   . Lactose intolerance   . Long term (current) use of anticoagulants 11/23/2012   Long-term anticoagulation with warfarin for stroke risk reduction related to AF   Past Surgical History:  Procedure Laterality Date  . CATARACT EXTRACTION W/ INTRAOCULAR LENS  IMPLANT, BILATERAL    . HERNIA REPAIR Right 1970s  . KNEE SURGERY Right 2000  . MASS EXCISION Left 07/30/2013   Procedure: EXCISION OF BASAL CELL CARCINOMA  LEFT FOREHEAD ;  Surgeon: Irene Limbo, MD;  Location: [facility];  Service: Plastics;  Laterality: Left;  . RETINAL DETACHMENT SURGERY Right 2009    Allergies  Allergen Reactions  . Penicillins Rash  . Bee Venom   . Risperidone And Related     Unstable gait      Medication List       Accurate as of 05/27/16  3:07 PM. Always use your most recent med list.          apixaban 5 MG Tabs tablet Commonly known as:  ELIQUIS Take 1 tablet (5 mg total) by mouth 2 (two) times daily.   ciclopirox  0.77 % cream Commonly known as:  LOPROX Apply topically 2 (two) times daily as needed (redness).   furosemide 20 MG tablet Commonly known as:  LASIX Take 20 mg by mouth daily.   ipratropium-albuterol 0.5-2.5 (3) MG/3ML Soln Commonly known as:  DUONEB Take 3 mLs by nebulization every 6 (six) hours as needed (for wheezing and cough).   nitroGLYCERIN 0.4 MG SL tablet Commonly known as:  NITROSTAT Place 0.4 mg under the tongue every 5 (five) minutes as needed for chest pain.   polyethylene glycol packet Commonly known as:  MIRALAX / GLYCOLAX Take 17 g by mouth daily.     potassium chloride 10 MEQ tablet Commonly known as:  K-DUR,KLOR-CON Take 10 mEq by mouth once.   potassium chloride SA 20 MEQ tablet Commonly known as:  K-DUR,KLOR-CON Take 2 tablets (40 mEq total) by mouth 2 (two) times daily.   spironolactone 25 MG tablet Commonly known as:  ALDACTONE Take 25 mg by mouth daily.   tamsulosin 0.4 MG Caps capsule Commonly known as:  FLOMAX Take 0.4 mg by mouth at bedtime. Hold for SBP < 95       Review of Systems  Constitutional: Negative for activity change, appetite change, chills and fever.  Respiratory: Negative for shortness of breath and wheezing.   Musculoskeletal: Negative for joint swelling.  Skin: Positive for wound.  Psychiatric/Behavioral: Positive for agitation. Negative for self-injury. The patient is not nervous/anxious.     Immunization History  Administered Date(s) Administered  . Influenza Inj Mdck Quad Pf 04/25/2016  . Influenza Whole 04/20/2013  . Influenza-Unspecified 04/12/2014, 04/20/2015  . PPD Test 11/29/2012   Pertinent  Health Maintenance Due  Topic Date Due  . PNA vac Low Risk Adult (1 of 2 - PCV13) 06/08/1990  . INFLUENZA VACCINE  Completed   No flowsheet data found. Functional Status Survey:    Vitals:   05/27/16 1443  BP: 140/72  Pulse: 63  Temp: 98 F (36.7 C)  SpO2: 91%   There is no height or weight on file to calculate BMI. Physical Exam  Constitutional: No distress.  HENT:  Head: Normocephalic and atraumatic.  Mouth/Throat: Oropharynx is clear and moist.  Eyes: Conjunctivae are normal. Pupils are equal, round, and reactive to light.  Cardiovascular:  BLE edema +2  Genitourinary:  Genitourinary Comments: Catheter with yellow urine present  Musculoskeletal: He exhibits no deformity.  Neurological: He is alert.  He is more alert today, verbal with mumbling communication  Skin: Skin is warm and dry. Ecchymosis noted. He is not diaphoretic.     Psychiatric: He has a normal mood and  affect.  Alert, verbal. Unable to f/c.   Nursing note and vitals reviewed.   Labs reviewed:  Recent Labs  04/19/16 0309  04/27/16 0110 04/27/16 0128 04/27/16 1020 04/28/16 0226 05/16/16 05/23/16  NA 142  < > 141 145  --  142 140 142  K 3.6  < > 3.6 3.9  --  2.9* 3.6 3.8  CL 111  --  115* 115*  --  111  --   --   CO2 23  --  20*  --   --  24  --   --   GLUCOSE 114*  --  122* 112*  --  105*  --   --   BUN 25*  < > 22* 31*  --  19 16 19   CREATININE 1.18  < > 1.11 1.10  --  1.28* 1.1 1.1  CALCIUM 8.4*  --  8.3*  --   --  8.4*  --   --   MG  --   --   --   --  2.0  --   --   --   < > = values in this interval not displayed.  Recent Labs  04/19/16 0309 04/27/16 0110 04/28/16 0226  AST 17 14* 17  ALT 13* 10* 10*  ALKPHOS 75 78 81  BILITOT 0.3 0.7 1.2  PROT 6.7 6.3* 6.3*  ALBUMIN 2.7* 2.6* 2.6*    Recent Labs  02/20/16 2355  04/19/16 0309 04/27/16 0110 04/27/16 0128 04/28/16 0226  WBC 10.1  < > 8.6 9.7  --  8.6  NEUTROABS 7.3  --  5.5 6.8  --   --   HGB 13.0  < > 12.4* 11.8* 11.9* 12.0*  HCT 40.5  < > 38.5* 36.8* 35.0* 37.2*  MCV 94.6  --  92.3 92.7  --  91.4  PLT 214  < > 313 332  --  346  < > = values in this interval not displayed. Lab Results  Component Value Date   TSH 3.83 01/29/2016   No results found for: HGBA1C Lab Results  Component Value Date   CHOL 155 08/31/2015   HDL 33 (A) 08/31/2015   LDLCALC 99 08/31/2015   TRIG 116 08/31/2015    Assessment/Plan 1. Infected skin tear -No purulent drainage from wound. Pt is afebrile -Continue Bactroban ointment bid for 7 more days -Continue dressing changes to wound as ordered  2. Dementia with behavioral disturbance, unspecified dementia type -Worsening agitation with mobility transfers -Start Ativan 0.5mg  PO q 8 hrs prn agitation  Family/ staff Communication: discussed with staff/nsg supervisor/resident's son via email [name]  Labs/tests ordered:  BMP weekly on Thursday

## 2016-05-30 DIAGNOSIS — R339 Retention of urine, unspecified: Secondary | ICD-10-CM | POA: Diagnosis not present

## 2016-05-30 DIAGNOSIS — I5032 Chronic diastolic (congestive) heart failure: Secondary | ICD-10-CM | POA: Diagnosis not present

## 2016-06-06 DIAGNOSIS — R339 Retention of urine, unspecified: Secondary | ICD-10-CM | POA: Diagnosis not present

## 2016-06-06 DIAGNOSIS — I5032 Chronic diastolic (congestive) heart failure: Secondary | ICD-10-CM | POA: Diagnosis not present

## 2016-06-13 DIAGNOSIS — R339 Retention of urine, unspecified: Secondary | ICD-10-CM | POA: Diagnosis not present

## 2016-06-13 DIAGNOSIS — I5032 Chronic diastolic (congestive) heart failure: Secondary | ICD-10-CM | POA: Diagnosis not present

## 2016-06-20 DIAGNOSIS — R339 Retention of urine, unspecified: Secondary | ICD-10-CM | POA: Diagnosis not present

## 2016-06-20 DIAGNOSIS — I5032 Chronic diastolic (congestive) heart failure: Secondary | ICD-10-CM | POA: Diagnosis not present

## 2016-06-20 LAB — BASIC METABOLIC PANEL
BUN: 23 mg/dL — AB (ref 4–21)
CREATININE: 1.1 mg/dL (ref 0.6–1.3)
Glucose: 83 mg/dL
POTASSIUM: 3.9 mmol/L (ref 3.4–5.3)
SODIUM: 139 mmol/L (ref 137–147)

## 2016-06-21 ENCOUNTER — Non-Acute Institutional Stay (SKILLED_NURSING_FACILITY): Payer: Medicare Other | Admitting: Adult Health

## 2016-06-21 DIAGNOSIS — I5032 Chronic diastolic (congestive) heart failure: Secondary | ICD-10-CM | POA: Diagnosis not present

## 2016-06-21 DIAGNOSIS — K5901 Slow transit constipation: Secondary | ICD-10-CM

## 2016-06-21 DIAGNOSIS — I482 Chronic atrial fibrillation, unspecified: Secondary | ICD-10-CM

## 2016-06-21 DIAGNOSIS — F0391 Unspecified dementia with behavioral disturbance: Secondary | ICD-10-CM | POA: Diagnosis not present

## 2016-06-21 DIAGNOSIS — N390 Urinary tract infection, site not specified: Secondary | ICD-10-CM | POA: Diagnosis not present

## 2016-06-21 DIAGNOSIS — R339 Retention of urine, unspecified: Secondary | ICD-10-CM | POA: Diagnosis not present

## 2016-06-21 DIAGNOSIS — M1711 Unilateral primary osteoarthritis, right knee: Secondary | ICD-10-CM | POA: Diagnosis not present

## 2016-06-21 DIAGNOSIS — R8281 Pyuria: Secondary | ICD-10-CM

## 2016-06-21 NOTE — Progress Notes (Signed)
Patient ID: Bryan [name], male   DOB: 1922/12/18, 82 y.o.   MRN: [id number]  Location:  [facility]:  SNF (31) Provider:   Cindi Carbon, ANP [place] [phone]   [name], [name], DO  Patient Care Team: [name], DO as PCP - General (Geriatric Medicine) Well [place], NP as Nurse Practitioner (Nurse Practitioner)  Extended Emergency Contact Information Primary Emergency Contact: [name] Address: [place] [place]          [employer], [place] [address] [place] Phone: [phone] Relation: Spouse Secondary Emergency Contact: [name], [place] Phone: [phone] Relation: Daughter  Code Status:  DNR Goals of care: Advanced Directive information Advanced Directives 06/21/2016  Does Patient Have a Medical Advance Directive? Yes  Type of Advance Directive Out of facility DNR (pink MOST or yellow form)  Does patient want to make changes to medical advance directive? -  Copy of [place] in Chart? -  Pre-existing out of facility DNR order (yellow form or pink MOST form) Pink MOST form placed in chart (order not valid for inpatient use);Yellow form placed in chart (order not valid for inpatient use)     Chief Complaint  Patient presents with  . Medical Management of Chronic Issues    HPI:  Pt is a 82 y.o. male seen today for medical management of chronic diseases.  Resides in memory care due to dementia.  Has caregivers 1:1 during the day. . VS stable.  Non ambulatory and minimally verbal.  1. Urine purulent Noted during my visit today with a strong odor present for several weeks Caregiver reports discomfort when touching or cleansing the penile area  2. Urinary retention Has a chronic foley due to BPH with nodule  3. Primary osteoarthritis of right knee Chronic swelling to  right knee, no reported pain per staff  4. Slow transit constipation Regular soft bms on miralax 17 grams qd  5. Dementia with behavioral disturbance, unspecified dementia type Verbal but can not follow command or answer q's  6. Chronic diastolic congestive heart failure (HCC) Weight stable 207-209 lbs I/O's neutral or slight neg No sob  Compression hose in place for edema, hx of venous insuff  7. Chronic atrial fibrillation (HCC) Rate controlled Last EKG in Oct of 2017 showed afib On eliquis for CVA risk reduction    Past Medical History:  Diagnosis Date  . Allergic rhinitis   . Alzheimer's disease 2013  . Atrial fibrillation ([facility])    Long term anti coagulation w/ warfarin fro stroke risk reduction  . BPH (benign prostatic hyperplasia)   . Chronic venous insufficiency 01/12/2013  . Dementia with behavioral disturbance 12/29/13  . Gait disorder   . GERD (gastroesophageal reflux disease)   . History of CVA (cerebrovascular accident) 2001   Rt.MCA  . Hyperlipidemia   . Lactose intolerance   . Long term (current) use of anticoagulants 11/23/2012   Long-term anticoagulation with warfarin for stroke risk reduction related to AF   Past Surgical History:  Procedure Laterality Date  . CATARACT EXTRACTION W/ INTRAOCULAR LENS  IMPLANT, BILATERAL    . HERNIA REPAIR Right 1970s  . KNEE SURGERY Right 2000  . MASS EXCISION Left 07/30/2013   Procedure: EXCISION OF BASAL CELL CARCINOMA  LEFT FOREHEAD ;  Surgeon: Irene Limbo, MD;  Location: [facility];  Service: Clinical cytogeneticist;  Laterality: Left;  . RETINAL DETACHMENT SURGERY Right 2009    Allergies  Allergen Reactions  . Penicillins Rash  . Bee Venom   . Risperidone And Related     Unstable gait    Allergies as of 06/21/2016      Reactions   Penicillins Rash   Bee Venom    Risperidone And Related    Unstable gait      Medication List       Accurate as of 06/21/16 11:40 AM. Always use your most recent med  list.          apixaban 5 MG Tabs tablet Commonly known as:  ELIQUIS Take 1 tablet (5 mg total) by mouth 2 (two) times daily.   ciclopirox 0.77 % cream Commonly known as:  LOPROX Apply topically 2 (two) times daily as needed (redness).   furosemide 20 MG tablet Commonly known as:  LASIX Take 20 mg by mouth daily.   ipratropium-albuterol 0.5-2.5 (3) MG/3ML Soln Commonly known as:  DUONEB Take 3 mLs by nebulization every 6 (six) hours as needed (for wheezing and cough).   nitroGLYCERIN 0.4 MG SL tablet Commonly known as:  NITROSTAT Place 0.4 mg under the tongue every 5 (five) minutes as needed for chest pain.   polyethylene glycol packet Commonly known as:  MIRALAX / GLYCOLAX Take 17 g by mouth daily.   potassium chloride 10 MEQ tablet Commonly known as:  K-DUR,KLOR-CON Take 10 mEq by mouth once.   potassium chloride SA 20 MEQ tablet Commonly known as:  K-DUR,KLOR-CON Take 2 tablets (40 mEq total) by mouth 2 (two) times daily.   spironolactone 25 MG tablet Commonly known as:  ALDACTONE Take 25 mg by mouth daily.   tamsulosin 0.4 MG Caps capsule Commonly known as:  FLOMAX Take 0.4 mg by mouth at bedtime. Hold for SBP < 95       Review of Systems  Unable to perform ROS: Dementia    Immunization History  Administered Date(s) Administered  . Influenza Inj Mdck Quad Pf 04/25/2016  . Influenza Whole 04/20/2013  . Influenza-Unspecified 04/12/2014, 04/20/2015  . PPD Test 11/29/2012   Pertinent  Health Maintenance Due  Topic Date Due  . PNA vac Low Risk Adult (1 of 2 - PCV13) 06/08/1990  . INFLUENZA VACCINE  Completed   Fall Risk  06/21/2016  Falls in the past year? No   Functional Status Survey:    Vitals:   06/21/16 1136  BP: 110/74  Pulse: 94  Resp: 20  Temp: 98.3 F (36.8 C)  SpO2: 97%  Weight: 207 lb (93.9 kg)   Body mass index is 28.07 kg/m. Physical Exam  Constitutional: No distress.  HENT:  Head: Normocephalic and atraumatic.  Nose:  Nose normal.  Mouth/Throat: Oropharynx is clear and moist.  Eyes: Conjunctivae are normal. Pupils are equal, round, and reactive to light. Right eye exhibits no discharge. Left eye exhibits no discharge.  Neck: No JVD present.  Cardiovascular: Normal rate.   No murmur heard. BLE edema +2  Pulmonary/Chest: Effort normal and breath sounds normal. No respiratory distress.  Abdominal: Soft. Bowel sounds are normal. He exhibits no distension. There is no tenderness.  Genitourinary:  Genitourinary Comments: Catheter with purulent urine. Very foul strong odor  Neurological: He is alert.  Mumbles words, not able to f/c  Skin: Skin is warm and dry. He is not diaphoretic.  Psychiatric: He has a normal mood and affect.  Nursing note and vitals reviewed.  Labs reviewed:  Recent Labs  04/19/16 0309  04/27/16 0110 04/27/16 0128 04/27/16 1020 04/28/16 0226 05/16/16 05/23/16 06/20/16  NA 142  < > 141 145  --  142 140 142 139  K 3.6  < > 3.6 3.9  --  2.9* 3.6 3.8 3.9  CL 111  --  115* 115*  --  111  --   --   --   CO2 23  --  20*  --   --  24  --   --   --   GLUCOSE 114*  --  122* 112*  --  105*  --   --   --   BUN 25*  < > 22* 31*  --  19 16 19  23*  CREATININE 1.18  < > 1.11 1.10  --  1.28* 1.1 1.1 1.1  CALCIUM 8.4*  --  8.3*  --   --  8.4*  --   --   --   MG  --   --   --   --  2.0  --   --   --   --   < > = values in this interval not displayed.  Recent Labs  04/19/16 0309 04/27/16 0110 04/28/16 0226  AST 17 14* 17  ALT 13* 10* 10*  ALKPHOS 75 78 81  BILITOT 0.3 0.7 1.2  PROT 6.7 6.3* 6.3*  ALBUMIN 2.7* 2.6* 2.6*    Recent Labs  02/20/16 2355  04/19/16 0309 04/27/16 0110 04/27/16 0128 04/28/16 0226  WBC 10.1  < > 8.6 9.7  --  8.6  NEUTROABS 7.3  --  5.5 6.8  --   --   HGB 13.0  < > 12.4* 11.8* 11.9* 12.0*  HCT 40.5  < > 38.5* 36.8* 35.0* 37.2*  MCV 94.6  --  92.3 92.7  --  91.4  PLT 214  < > 313 332  --  346  < > = values in this interval not displayed. Lab Results   Component Value Date   TSH 3.83 01/29/2016   No results found for: HGBA1C Lab Results  Component Value Date   CHOL 155 08/31/2015   HDL 33 (A) 08/31/2015   LDLCALC 99 08/31/2015   TRIG 116 08/31/2015    Significant Diagnostic Results in last 30 days:  No results found.   Assessment and Plan  1. Urine purulent Also has very foul odor and some discomfort to the penile area Will check UA C and S, change foley prior to obtaining specimen  2. Urinary retention Due to BPH Maintain foley, monitor output Change q 8 weeks  3. Primary osteoarthritis of right knee Mild swelling on exam, no pain reported Standing order for tylenol prn  4. Slow transit constipation Controlled Continue miralax 17 grams qd  5. Dementia with behavioral disturbance, unspecified dementia type Severe Continues to resist care despite seroquel 25 mg qd May consider increasing at next visit  6. Chronic diastolic congestive heart failure (HCC) Weight stable between 207-209 lbs No sob Edema noted to BLE stable, slight decrease in calf measurements noted Continue to monitor BMP Continue lasix 20 mg qd with kdur 10  Meq qd Weight daily  7. AFIB Rate controlled Continue eliquis at 5 mg BID for CVA risk reduction H/H stable    Family/ staff Communication: discussed with staff  Labs/tests ordered:  BMP weekly on Thursday

## 2016-06-22 DIAGNOSIS — R300 Dysuria: Secondary | ICD-10-CM | POA: Diagnosis not present

## 2016-06-22 DIAGNOSIS — N390 Urinary tract infection, site not specified: Secondary | ICD-10-CM | POA: Diagnosis not present

## 2016-06-22 DIAGNOSIS — Z79899 Other long term (current) drug therapy: Secondary | ICD-10-CM | POA: Diagnosis not present

## 2016-06-22 DIAGNOSIS — E708 Other disorders of aromatic amino-acid metabolism: Secondary | ICD-10-CM | POA: Diagnosis not present

## 2016-06-22 DIAGNOSIS — R8299 Other abnormal findings in urine: Secondary | ICD-10-CM | POA: Diagnosis not present

## 2016-06-27 DIAGNOSIS — I5032 Chronic diastolic (congestive) heart failure: Secondary | ICD-10-CM | POA: Diagnosis not present

## 2016-06-27 DIAGNOSIS — R339 Retention of urine, unspecified: Secondary | ICD-10-CM | POA: Diagnosis not present

## 2016-07-04 DIAGNOSIS — R339 Retention of urine, unspecified: Secondary | ICD-10-CM | POA: Diagnosis not present

## 2016-07-04 DIAGNOSIS — I5032 Chronic diastolic (congestive) heart failure: Secondary | ICD-10-CM | POA: Diagnosis not present

## 2016-07-04 LAB — BASIC METABOLIC PANEL
BUN: 21 mg/dL (ref 4–21)
CREATININE: 1 mg/dL (ref 0.6–1.3)
Glucose: 96 mg/dL
POTASSIUM: 3.5 mmol/L (ref 3.4–5.3)
Sodium: 140 mmol/L (ref 137–147)

## 2016-07-05 ENCOUNTER — Non-Acute Institutional Stay (SKILLED_NURSING_FACILITY): Payer: Medicare Other | Admitting: Adult Health

## 2016-07-05 ENCOUNTER — Encounter: Payer: Self-pay | Admitting: Adult Health

## 2016-07-05 DIAGNOSIS — R310 Gross hematuria: Secondary | ICD-10-CM

## 2016-07-05 NOTE — Progress Notes (Signed)
Patient ID: Bryan [name], male   DOB: [birth year], 82 y.o.   MRN: [id number]  Location:  [facility]:  SNF (31) Provider:   Cindi Carbon, ANP [place] [phone]   [name], [name], DO  Patient Care Team: [name], DO as PCP - General (Geriatric Medicine) Well [place], NP as Nurse Practitioner (Nurse Practitioner)  Extended Emergency Contact Information Primary Emergency Contact: Bryan [name] Address: [address]          [place], [place] [address] [place] Phone: [phone] Relation: Spouse Secondary Emergency Contact: Bryan [name], [place] Phone: [phone] Relation: Daughter  Code Status:  DNR Goals of care: Advanced Directive information Advanced Directives 07/05/2016  Does Patient Have a Medical Advance Directive? Yes  Type of Advance Directive Out of facility DNR (pink MOST or yellow form)  Does patient want to make changes to medical advance directive? -  Copy of [place] in Chart? Yes  Pre-existing out of facility DNR order (yellow form or pink MOST form) Pink MOST form placed in chart (order not valid for inpatient use);Yellow form placed in chart (order not valid for inpatient use)     Chief Complaint  Patient presents with  . Acute Visit    hematuria    HPI:  Pt is a 82 y.o. male seen today for hematuria.  He has a chronic indwelling foley catheter due to BPH with urinary retention, as well as prostate nodule.  ON 12/15  A UA C and S was collected due to hematuria, purulent urine, odor and discomfort.  40,000 colonies enterococcus were noted and he was started on Cipro 500 mg BID for 7 days, which was completed on 12/26.  Starting 12/29 blood was noted in the catheter once again. There is no odor or other associated symptom.    06/22/16: UA cludy, neg nitrite,  2+ protein, neg bilirubin, neg ketones, 3+ blood, spec gravity 1.025, 3+ leukocyte esterase  Past Medical History:  Diagnosis Date  . Allergic rhinitis   . Alzheimer's disease 2013  . Atrial fibrillation ([facility])    Long term anti coagulation w/ warfarin fro stroke risk reduction  . BPH (benign prostatic hyperplasia)   . Chronic venous insufficiency 01/12/2013  . Dementia with behavioral disturbance 12/29/13  . Gait disorder   . GERD (gastroesophageal reflux disease)   . History of CVA (cerebrovascular accident) 2001   Rt.MCA  . Hyperlipidemia   . Lactose intolerance   . Long term (current) use of anticoagulants 11/23/2012   Long-term anticoagulation with warfarin for stroke risk reduction related to AF   Past Surgical History:  Procedure Laterality Date  . CATARACT EXTRACTION W/ INTRAOCULAR LENS  IMPLANT, BILATERAL    . HERNIA REPAIR Right 1970s  . KNEE SURGERY Right 2000  . MASS EXCISION Left 07/30/2013   Procedure: EXCISION OF BASAL CELL CARCINOMA  LEFT FOREHEAD ;  Surgeon: Irene Limbo, MD;  Location: [facility];  Service: Plastics;  Laterality: Left;  . RETINAL DETACHMENT SURGERY Right 2009    Allergies  Allergen Reactions  . Penicillins Rash  . Bee Venom   . Risperidone And Related     Unstable gait    Allergies as of 07/05/2016      Reactions   Penicillins Rash   Bee Venom    Risperidone And Related  Unstable gait      Medication List       Accurate as of 07/05/16 10:27 AM. Always use your most recent med list.          apixaban 5 MG Tabs tablet Commonly known as:  ELIQUIS Take 1 tablet (5 mg total) by mouth 2 (two) times daily.   ciclopirox 0.77 % cream Commonly known as:  LOPROX Apply topically 2 (two) times daily as needed (redness).   furosemide 20 MG tablet Commonly known as:  LASIX Take 20 mg by mouth daily.   ipratropium-albuterol 0.5-2.5 (3) MG/3ML Soln Commonly known as:  DUONEB Take 3 mLs by nebulization every 6 (six)  hours as needed (for wheezing and cough).   nitroGLYCERIN 0.4 MG SL tablet Commonly known as:  NITROSTAT Place 0.4 mg under the tongue every 5 (five) minutes as needed for chest pain.   polyethylene glycol packet Commonly known as:  MIRALAX / GLYCOLAX Take 17 g by mouth daily.   potassium chloride 10 MEQ tablet Commonly known as:  K-DUR,KLOR-CON Take 10 mEq by mouth once.   spironolactone 25 MG tablet Commonly known as:  ALDACTONE Take 25 mg by mouth daily.   tamsulosin 0.4 MG Caps capsule Commonly known as:  FLOMAX Take 0.4 mg by mouth at bedtime. Hold for SBP < 95       Review of Systems  Unable to perform ROS: Dementia    Immunization History  Administered Date(s) Administered  . Influenza Inj Mdck Quad Pf 04/25/2016  . Influenza Whole 04/20/2013  . Influenza-Unspecified 04/12/2014, 04/20/2015  . PPD Test 11/29/2012   Pertinent  Health Maintenance Due  Topic Date Due  . PNA vac Low Risk Adult (1 of 2 - PCV13) 06/08/1990  . INFLUENZA VACCINE  Completed   Fall Risk  06/21/2016  Falls in the past year? No   Functional Status Survey:    Vitals:   07/05/16 1026  BP: 105/66  Pulse: 66  Resp: 16  Temp: 97.4 F (36.3 C)  SpO2: 98%   There is no height or weight on file to calculate BMI. Physical Exam  Constitutional: No distress.  HENT:  Head: Normocephalic and atraumatic.  Cardiovascular: Normal rate.   No murmur heard. Irregular BLE edema +2  Pulmonary/Chest: Effort normal and breath sounds normal. No respiratory distress.  Abdominal: Soft. Bowel sounds are normal. He exhibits no distension. There is no tenderness.  No s/p or cva tenderness  Genitourinary: No penile tenderness.  Genitourinary Comments: Catheter with bloody urine, no clots.  No odor  Neurological: He is alert.  Mumbles, can not f/c  Skin: Skin is warm and dry. He is not diaphoretic.  Psychiatric: He has a normal mood and affect.  Nursing note and vitals reviewed.   Labs  reviewed:  Recent Labs  04/19/16 0309  04/27/16 0110 04/27/16 0128 04/27/16 1020 04/28/16 0226  05/23/16 06/20/16 07/04/16  NA 142  < > 141 145  --  142  < > 142 139 140  K 3.6  < > 3.6 3.9  --  2.9*  < > 3.8 3.9 3.5  CL 111  --  115* 115*  --  111  --   --   --   --   CO2 23  --  20*  --   --  24  --   --   --   --   GLUCOSE 114*  --  122* 112*  --  105*  --   --   --   --  BUN 25*  < > 22* 31*  --  19  < > 19 23* 21  CREATININE 1.18  < > 1.11 1.10  --  1.28*  < > 1.1 1.1 1.0  CALCIUM 8.4*  --  8.3*  --   --  8.4*  --   --   --   --   MG  --   --   --   --  2.0  --   --   --   --   --   < > = values in this interval not displayed.  Recent Labs  04/19/16 0309 04/27/16 0110 04/28/16 0226  AST 17 14* 17  ALT 13* 10* 10*  ALKPHOS 75 78 81  BILITOT 0.3 0.7 1.2  PROT 6.7 6.3* 6.3*  ALBUMIN 2.7* 2.6* 2.6*    Recent Labs  02/20/16 2355  04/19/16 0309 04/27/16 0110 04/27/16 0128 04/28/16 0226  WBC 10.1  < > 8.6 9.7  --  8.6  NEUTROABS 7.3  --  5.5 6.8  --   --   HGB 13.0  < > 12.4* 11.8* 11.9* 12.0*  HCT 40.5  < > 38.5* 36.8* 35.0* 37.2*  MCV 94.6  --  92.3 92.7  --  91.4  PLT 214  < > 313 332  --  346  < > = values in this interval not displayed. Lab Results  Component Value Date   TSH 3.83 01/29/2016   No results found for: HGBA1C Lab Results  Component Value Date   CHOL 155 08/31/2015   HDL 33 (A) 08/31/2015   LDLCALC 99 08/31/2015   TRIG 116 08/31/2015    Significant Diagnostic Results in last 30 days:  No results found.   Assessment and Plan  1.  Hematuria  Hold eliquis for 5 days Recollect UA C and S CBC with next blood draw Report if no improvement Monitor VS  May need Referral to urology  Family/ staff Communication: discussed with staff  Cindi Carbon, [place] [phone]

## 2016-07-06 DIAGNOSIS — Z79899 Other long term (current) drug therapy: Secondary | ICD-10-CM | POA: Diagnosis not present

## 2016-07-06 DIAGNOSIS — N029 Recurrent and persistent hematuria with unspecified morphologic changes: Secondary | ICD-10-CM | POA: Diagnosis not present

## 2016-07-06 DIAGNOSIS — N390 Urinary tract infection, site not specified: Secondary | ICD-10-CM | POA: Diagnosis not present

## 2016-07-06 DIAGNOSIS — R319 Hematuria, unspecified: Secondary | ICD-10-CM | POA: Diagnosis not present

## 2016-07-06 LAB — CBC AND DIFFERENTIAL
HEMATOCRIT: 39 % — AB (ref 41–53)
Hemoglobin: 12.9 g/dL — AB (ref 13.5–17.5)
PLATELETS: 206 10*3/uL (ref 150–399)
WBC: 7.6 10*3/mL

## 2016-07-11 DIAGNOSIS — I5032 Chronic diastolic (congestive) heart failure: Secondary | ICD-10-CM | POA: Diagnosis not present

## 2016-07-11 DIAGNOSIS — R339 Retention of urine, unspecified: Secondary | ICD-10-CM | POA: Diagnosis not present

## 2016-07-25 DIAGNOSIS — I5032 Chronic diastolic (congestive) heart failure: Secondary | ICD-10-CM | POA: Diagnosis not present

## 2016-07-25 DIAGNOSIS — R339 Retention of urine, unspecified: Secondary | ICD-10-CM | POA: Diagnosis not present

## 2016-07-25 LAB — BASIC METABOLIC PANEL
BUN: 23 mg/dL — AB (ref 4–21)
CREATININE: 1.2 mg/dL (ref 0.6–1.3)
Glucose: 119 mg/dL
Potassium: 4.2 mmol/L (ref 3.4–5.3)
Sodium: 143 mmol/L (ref 137–147)

## 2016-08-01 ENCOUNTER — Encounter: Payer: Self-pay | Admitting: Adult Health

## 2016-08-01 ENCOUNTER — Non-Acute Institutional Stay (SKILLED_NURSING_FACILITY): Payer: Medicare Other | Admitting: Adult Health

## 2016-08-01 DIAGNOSIS — I5032 Chronic diastolic (congestive) heart failure: Secondary | ICD-10-CM

## 2016-08-01 DIAGNOSIS — F0281 Dementia in other diseases classified elsewhere with behavioral disturbance: Secondary | ICD-10-CM

## 2016-08-01 DIAGNOSIS — Z23 Encounter for immunization: Secondary | ICD-10-CM | POA: Diagnosis not present

## 2016-08-01 DIAGNOSIS — G301 Alzheimer's disease with late onset: Secondary | ICD-10-CM | POA: Diagnosis not present

## 2016-08-01 DIAGNOSIS — R1312 Dysphagia, oropharyngeal phase: Secondary | ICD-10-CM

## 2016-08-01 DIAGNOSIS — R339 Retention of urine, unspecified: Secondary | ICD-10-CM

## 2016-08-01 DIAGNOSIS — I482 Chronic atrial fibrillation, unspecified: Secondary | ICD-10-CM

## 2016-08-01 DIAGNOSIS — F02818 Dementia in other diseases classified elsewhere, unspecified severity, with other behavioral disturbance: Secondary | ICD-10-CM

## 2016-08-01 NOTE — Progress Notes (Signed)
Patient ID: Bryan [name], male   DOB: [birth year], 83 y.o.   MRN: [id number]  Location:  [place]:  SNF (31) Provider:   Cindi Carbon, ANP [place] [phone]   [name], [name], DO  Patient Care Team: [name], DO as PCP - General (Geriatric Medicine) Well Nogal, NP as Nurse Practitioner (Nurse Practitioner)  Extended Emergency Contact Information Primary Emergency Contact: Bryan [name] Address: [address]          [place], [place] [address] [place] Phone: [phone] Relation: Spouse Secondary Emergency Contact: Bryan [name], Bertrand [place] Phone: [phone] Relation: Daughter  Code Status:  DNR Goals of care: Advanced Directive information Advanced Directives 08/01/2016  Does Patient Have a Medical Advance Directive? Yes  Type of Advance Directive Out of facility DNR (pink MOST or yellow form);[place];Living will  Does patient want to make changes to medical advance directive? -  Copy of [place] in Chart? Yes  Pre-existing out of facility DNR order (yellow form or pink MOST form) Pink MOST form placed in chart (order not valid for inpatient use);Yellow form placed in chart (order not valid for inpatient use)     Chief Complaint  Patient presents with  . Medical Management of Chronic Issues    HPI:  Pt is a 83 y.o. male seen today for medical management of chronic diseases.  Resides in memory care due to dementia.  Has caregivers 1:1 during the day. . VS stable.  Non ambulatory and minimally verbal.  Afib: rate controlled without meds, continues on eliquis for CVA risk reduction  Urinary retention: has a foley due to bph with retention, also hx of prostate nodule not worked up due to age/debility Small amt of blood reported around the cath site,  not in the foley bag. Takes flomax to help with foley insertion  Dementia: advanced, no longer to perform for MMSE.  ON seroquel 25 mg qam to help prior to personal care.  Still resists staff at times.  Dysphagia: no reports of choking, currently on puree NTL  CHF: diastolic, weight stable at 208 lbs.  No sob or chest pain. Not on ace as BP runs low at times, also due to goals of care. Chronic edema to BLE 3-4+ remains unchanged.  EF 50-55% 06/22/14   Past Medical History:  Diagnosis Date  . Allergic rhinitis   . Alzheimer's disease 2013  . Atrial fibrillation ([place])    Long term anti coagulation w/ warfarin fro stroke risk reduction  . BPH (benign prostatic hyperplasia)   . Chronic venous insufficiency 01/12/2013  . Dementia with behavioral disturbance 12/29/13  . Gait disorder   . GERD (gastroesophageal reflux disease)   . History of CVA (cerebrovascular accident) 2001   Rt.MCA  . Hyperlipidemia   . Lactose intolerance   . Long term (current) use of anticoagulants 11/23/2012   Long-term anticoagulation with warfarin for stroke risk reduction related to AF   Past Surgical History:  Procedure Laterality Date  . CATARACT EXTRACTION W/ INTRAOCULAR LENS  IMPLANT, BILATERAL    . HERNIA REPAIR Right 1970s  . KNEE SURGERY Right 2000  . MASS EXCISION Left 07/30/2013   Procedure: EXCISION OF BASAL CELL CARCINOMA  LEFT FOREHEAD ;  Surgeon: Irene Limbo, MD;  Location: [facility];  Service: Plastics;  Laterality: Left;  . RETINAL DETACHMENT SURGERY Right 2009    Allergies  Allergen Reactions  . Penicillins Rash  . Bee Venom   . Risperidone And Related     Unstable gait    Allergies as of 08/01/2016      Reactions   Penicillins Rash   Bee Venom    Risperidone And Related    Unstable gait      Medication List       Accurate as of 08/01/16  3:34 PM. Always use your most recent med list.          ciclopirox 0.77 % cream Commonly known as:  LOPROX Apply  topically 2 (two) times daily as needed (redness).   ELIQUIS 2.5 MG Tabs tablet Generic drug:  apixaban Take 2.5 mg by mouth 2 (two) times daily.   furosemide 20 MG tablet Commonly known as:  LASIX Take 20 mg by mouth daily.   ipratropium-albuterol 0.5-2.5 (3) MG/3ML Soln Commonly known as:  DUONEB Take 3 mLs by nebulization every 6 (six) hours as needed (for wheezing and cough).   lactose free nutrition Liqd Take 237 mLs by mouth 2 (two) times daily between meals.   nitroGLYCERIN 0.4 MG SL tablet Commonly known as:  NITROSTAT Place 0.4 mg under the tongue every 5 (five) minutes as needed for chest pain.   polyethylene glycol packet Commonly known as:  MIRALAX / GLYCOLAX Take 17 g by mouth daily.   potassium chloride 10 MEQ tablet Commonly known as:  K-DUR,KLOR-CON Take 10 mEq by mouth once.   QUEtiapine 25 MG tablet Commonly known as:  SEROQUEL Take 25 mg by mouth daily with breakfast.   spironolactone 25 MG tablet Commonly known as:  ALDACTONE Take 25 mg by mouth daily.   tamsulosin 0.4 MG Caps capsule Commonly known as:  FLOMAX Take 0.4 mg by mouth at bedtime. Hold for SBP < 95       Review of Systems  Unable to perform ROS: Dementia    Immunization History  Administered Date(s) Administered  . Influenza Inj Mdck Quad Pf 04/25/2016  . Influenza Whole 04/20/2013  . Influenza-Unspecified 04/12/2014, 04/20/2015  . PPD Test 11/29/2012   Pertinent  Health Maintenance Due  Topic Date Due  . PNA vac Low Risk Adult (1 of 2 - PCV13) 06/08/1990  . INFLUENZA VACCINE  Completed   Fall Risk  06/21/2016  Falls in the past year? No   Functional Status Survey:    Vitals:   08/01/16 1521  BP: 107/62  Pulse: 80  Resp: 20  Temp: (!) 96.7 F (35.9 C)  SpO2: 97%  Weight: 208 lb 14.4 oz (94.8 kg)   Body mass index is 28.33 kg/m.  Wt Readings from Last 3 Encounters:  08/01/16 208 lb 14.4 oz (94.8 kg)  06/21/16 207 lb (93.9 kg)  05/20/16 211 lb (95.7 kg)     Physical Exam  Constitutional: No distress.  HENT:  Head: Normocephalic and atraumatic.  Nose: Nose normal.  Mouth/Throat: Oropharynx is clear and moist.  Eyes: Conjunctivae are normal. Pupils are equal, round, and reactive to light. Right eye exhibits no discharge. Left eye exhibits no discharge.  Neck: No JVD present.  Cardiovascular: Normal rate.   No murmur heard. BLE edema +3. Irregular rhythm  Pulmonary/Chest: Effort normal and breath sounds normal. No respiratory distress.  Abdominal: Soft. Bowel sounds are normal. He exhibits no distension. There is no tenderness.  Genitourinary: No penile tenderness.  Genitourinary Comments: Catheter with clear yellow  urine, blood around the site, hypospadias noted  Musculoskeletal:  Rigidity to BUE and BLE  Neurological: He is alert.  Mumbles words, not able to f/c  Skin: Skin is warm and dry. He is not diaphoretic.  Psychiatric: He has a normal mood and affect.  Nursing note and vitals reviewed.   Labs reviewed:  Recent Labs  04/19/16 0309  04/27/16 0110 04/27/16 0128 04/27/16 1020 04/28/16 0226  06/20/16 07/04/16 07/25/16  NA 142  < > 141 145  --  142  < > 139 140 143  K 3.6  < > 3.6 3.9  --  2.9*  < > 3.9 3.5 4.2  CL 111  --  115* 115*  --  111  --   --   --   --   CO2 23  --  20*  --   --  24  --   --   --   --   GLUCOSE 114*  --  122* 112*  --  105*  --   --   --   --   BUN 25*  < > 22* 31*  --  19  < > 23* 21 23*  CREATININE 1.18  < > 1.11 1.10  --  1.28*  < > 1.1 1.0 1.2  CALCIUM 8.4*  --  8.3*  --   --  8.4*  --   --   --   --   MG  --   --   --   --  2.0  --   --   --   --   --   < > = values in this interval not displayed.  Recent Labs  04/19/16 0309 04/27/16 0110 04/28/16 0226  AST 17 14* 17  ALT 13* 10* 10*  ALKPHOS 75 78 81  BILITOT 0.3 0.7 1.2  PROT 6.7 6.3* 6.3*  ALBUMIN 2.7* 2.6* 2.6*    Recent Labs  02/20/16 2355  04/19/16 0309 04/27/16 0110 04/27/16 0128 04/28/16 0226 07/06/16  WBC 10.1   < > 8.6 9.7  --  8.6 7.6  NEUTROABS 7.3  --  5.5 6.8  --   --   --   HGB 13.0  < > 12.4* 11.8* 11.9* 12.0* 12.9*  HCT 40.5  < > 38.5* 36.8* 35.0* 37.2* 39*  MCV 94.6  --  92.3 92.7  --  91.4  --   PLT 214  < > 313 332  --  346 206  < > = values in this interval not displayed. Lab Results  Component Value Date   TSH 3.83 01/29/2016   No results found for: HGBA1C Lab Results  Component Value Date   CHOL 155 08/31/2015   HDL 33 (A) 08/31/2015   LDLCALC 99 08/31/2015   TRIG 116 08/31/2015    Significant Diagnostic Results in last 30 days:  No results found.   Assessment and Plan  Atrial fibrillation Controlled rate. Continue eliquis 2.5 mg BID. High dose produced hematuria  Diastolic CHF (HCC) Weight stable, no symptoms. Also has venous insuff .  Continue lasix, aldactone, and kdur.  Monitor weight qd.  BMP q weekly.    Dysphagia Continue puree diet with NTL.  Asp prec.  1:1 supervision with meals  Dementia with behavioral disturbance Continue seroquel 25 mg qd.  No longer on memory meds due to [name] of benefit.  Continues with resistance to care so would not taper seroquel  Urinary retention Maintain cath and change per facility  protocol.  Keep positioned so that the weight of the foley bag is no on the penis which I believe was causing the irritation I saw today  Prenar 13 ordered   Family/ staff Communication: discussed with staff  Labs/tests ordered:  BMP weekly on Thursday

## 2016-08-02 DIAGNOSIS — Z23 Encounter for immunization: Secondary | ICD-10-CM | POA: Diagnosis not present

## 2016-08-06 DIAGNOSIS — R1310 Dysphagia, unspecified: Secondary | ICD-10-CM | POA: Insufficient documentation

## 2016-08-06 NOTE — Assessment & Plan Note (Signed)
Weight stable, no symptoms. Also has venous insuff .  Continue lasix, aldactone, and kdur.  Monitor weight qd.  BMP q weekly.

## 2016-08-06 NOTE — Assessment & Plan Note (Signed)
Continue puree diet with NTL.  Asp prec.  1:1 supervision with meals

## 2016-08-06 NOTE — Assessment & Plan Note (Signed)
Maintain cath and change per facility protocol.  Keep positioned so that the weight of the foley bag is no on the penis which I believe was causing the irritation I saw today

## 2016-08-06 NOTE — Assessment & Plan Note (Signed)
Continue seroquel 25 mg qd.  No longer on memory meds due to lack of benefit.  Continues with resistance to care so would not taper seroquel

## 2016-08-06 NOTE — Assessment & Plan Note (Addendum)
Controlled rate. Continue eliquis 2.5 mg BID. High dose produced hematuria

## 2016-08-08 DIAGNOSIS — I5032 Chronic diastolic (congestive) heart failure: Secondary | ICD-10-CM | POA: Diagnosis not present

## 2016-08-08 DIAGNOSIS — R339 Retention of urine, unspecified: Secondary | ICD-10-CM | POA: Diagnosis not present

## 2016-08-08 DIAGNOSIS — E039 Hypothyroidism, unspecified: Secondary | ICD-10-CM | POA: Diagnosis not present

## 2016-08-08 LAB — TSH: TSH: 5.05 u[IU]/mL (ref <=5.90)

## 2016-08-15 DIAGNOSIS — I5032 Chronic diastolic (congestive) heart failure: Secondary | ICD-10-CM | POA: Diagnosis not present

## 2016-08-15 DIAGNOSIS — R339 Retention of urine, unspecified: Secondary | ICD-10-CM | POA: Diagnosis not present

## 2016-08-16 ENCOUNTER — Non-Acute Institutional Stay (SKILLED_NURSING_FACILITY): Payer: Medicare Other | Admitting: Adult Health

## 2016-08-16 ENCOUNTER — Encounter: Payer: Self-pay | Admitting: Adult Health

## 2016-08-16 DIAGNOSIS — R634 Abnormal weight loss: Secondary | ICD-10-CM | POA: Diagnosis not present

## 2016-08-16 DIAGNOSIS — I5032 Chronic diastolic (congestive) heart failure: Secondary | ICD-10-CM | POA: Diagnosis not present

## 2016-08-16 NOTE — Progress Notes (Signed)
Patient ID: Bryan [name], male   DOB: December 08, 1922, 83 y.o.   MRN: [id number]  Location:  [place]:  SNF (31) Provider:   Cindi Carbon, ANP [place] [phone]   [name], [name], DO  Patient Care Team: [name], DO as PCP - General (Geriatric Medicine) Well [place], NP as Nurse Practitioner (Nurse Practitioner)  Extended Emergency Contact Information Primary Emergency Contact: [name] Address: [address]          [place], [place] [address] [place] Phone: [phone] Relation: Spouse Secondary Emergency Contact: [name], [place] Phone: [phone] Relation: Daughter  Code Status:  DNR Goals of care: Advanced Directive information Advanced Directives 08/01/2016  Does Patient Have a Medical Advance Directive? Yes  Type of Advance Directive Out of facility DNR (pink MOST or yellow form);[place];Living will  Does patient want to make changes to medical advance directive? -  Copy of [place] in Chart? Yes  Pre-existing out of facility DNR order (yellow form or pink MOST form) Pink MOST form placed in chart (order not valid for inpatient use);Yellow form placed in chart (order not valid for inpatient use)     Chief Complaint  Patient presents with  . Acute Visit    weight loss    HPI:  Pt is a 83 y.o. male seen today for weight loss.  Resides in memory care due to dementia.  Has caregivers 1:1 during the day. . VS stable.  Non ambulatory and minimally verbal. Daily weight show weight loss. Records indicate that he eats 75-100% of meals and there is no change in fluid intake. Staff note less edema in his legs. BMPs are stable. Diuretics have been on hold since 2/1 due to weight loss. 8-10 lbs loss in the past month.  No reports of cough,  congestion, pain, etc. His caretaker wonders if he has enough calories offered on his trays. He also takes boost twice a day. Lab Results  Component Value Date   TSH 5.05 08/08/2016    Dysphagia: no reports of choking, currently on puree NTL  CHF: diastolic, weight dropping.  No sob or chest pain. Not on ace as BP runs low at times, also due to goals of care. EF 50-55% 06/22/14   Past Medical History:  Diagnosis Date  . Allergic rhinitis   . Alzheimer's disease 2013  . Atrial fibrillation ([place])    Long term anti coagulation w/ warfarin fro stroke risk reduction  . BPH (benign prostatic hyperplasia)   . Chronic venous insufficiency 01/12/2013  . Dementia with behavioral disturbance 12/29/13  . Gait disorder   . GERD (gastroesophageal reflux disease)   . History of CVA (cerebrovascular accident) 2001   Rt.MCA  . Hyperlipidemia   . Lactose intolerance   . Long term (current) use of anticoagulants 11/23/2012   Long-term anticoagulation with warfarin for stroke risk reduction related to AF   Past Surgical History:  Procedure Laterality Date  . CATARACT EXTRACTION W/ INTRAOCULAR LENS  IMPLANT, BILATERAL    . HERNIA REPAIR Right 1970s  . KNEE SURGERY Right 2000  . MASS EXCISION Left 07/30/2013   Procedure: EXCISION OF BASAL CELL CARCINOMA  LEFT FOREHEAD ;  Surgeon: Irene Limbo, MD;  Location: [facility];  Service: Plastics;  Laterality: Left;  . RETINAL DETACHMENT  SURGERY Right 2009    Allergies  Allergen Reactions  . Penicillins Rash  . Bee Venom   . Risperidone And Related     Unstable gait    Allergies as of 08/16/2016      Reactions   Penicillins Rash   Bee Venom    Risperidone And Related    Unstable gait      Medication List       Accurate as of 08/16/16 10:11 AM. Always use your most recent med list.          ciclopirox 0.77 % cream Commonly known as:  LOPROX Apply topically 2 (two) times daily as needed (redness).   ELIQUIS 2.5 MG Tabs  tablet Generic drug:  apixaban Take 2.5 mg by mouth 2 (two) times daily.   ipratropium-albuterol 0.5-2.5 (3) MG/3ML Soln Commonly known as:  DUONEB Take 3 mLs by nebulization every 6 (six) hours as needed (for wheezing and cough).   lactose free nutrition Liqd Take 237 mLs by mouth 2 (two) times daily between meals.   nitroGLYCERIN 0.4 MG SL tablet Commonly known as:  NITROSTAT Place 0.4 mg under the tongue every 5 (five) minutes as needed for chest pain.   polyethylene glycol packet Commonly known as:  MIRALAX / GLYCOLAX Take 17 g by mouth daily.   QUEtiapine 25 MG tablet Commonly known as:  SEROQUEL Take 25 mg by mouth daily with breakfast.   tamsulosin 0.4 MG Caps capsule Commonly known as:  FLOMAX Take 0.4 mg by mouth at bedtime. Hold for SBP < 95       Review of Systems  Unable to perform ROS: Dementia    Immunization History  Administered Date(s) Administered  . Influenza Inj Mdck Quad Pf 04/25/2016  . Influenza Whole 04/20/2013  . Influenza-Unspecified 04/12/2014, 04/20/2015  . PPD Test 11/29/2012  . Pneumococcal Conjugate-13 08/01/2016   Pertinent  Health Maintenance Due  Topic Date Due  . PNA vac Low Risk Adult (1 of 2 - PCV13) 06/08/1990  . INFLUENZA VACCINE  Completed   Fall Risk  06/21/2016  Falls in the past year? No   Functional Status Survey:    Vitals:   08/16/16 1010  BP: (!) 106/56  Pulse: 76  Resp: 20  Temp: 97.8 F (36.6 C)  SpO2: 95%  Weight: 200 lb (90.7 kg)   Body mass index is 27.12 kg/m.  Wt Readings from Last 3 Encounters:  08/16/16 200 lb (90.7 kg)  08/01/16 208 lb 14.4 oz (94.8 kg)  06/21/16 207 lb (93.9 kg)    Physical Exam  Constitutional: No distress.  HENT:  Head: Normocephalic and atraumatic.  Eyes: Conjunctivae are normal. Pupils are equal, round, and reactive to light. Right eye exhibits no discharge. Left eye exhibits no discharge.  Neck: No JVD present.  Cardiovascular: Normal rate.   No murmur  heard. BLE edema +2. Irregular rhythm  Pulmonary/Chest: Effort normal and breath sounds normal. No respiratory distress.  Abdominal: Soft. Bowel sounds are normal. He exhibits no distension. There is no tenderness.  Musculoskeletal:  Rigidity to BUE and BLE  Neurological: He is alert.  Mumbles words, not able to f/c  Skin: Skin is warm and dry. He is not diaphoretic.  Psychiatric: He has a normal mood and affect.  Nursing note and vitals reviewed.   Labs reviewed:  Recent Labs  04/19/16 0309  04/27/16 0110 04/27/16 0128 04/27/16 1020 04/28/16 0226  06/20/16 07/04/16 07/25/16  NA 142  < > 141 145  --  142  < > 139 140 143  K 3.6  < > 3.6 3.9  --  2.9*  < > 3.9 3.5 4.2  CL 111  --  115* 115*  --  111  --   --   --   --   CO2 23  --  20*  --   --  24  --   --   --   --   GLUCOSE 114*  --  122* 112*  --  105*  --   --   --   --   BUN 25*  < > 22* 31*  --  19  < > 23* 21 23*  CREATININE 1.18  < > 1.11 1.10  --  1.28*  < > 1.1 1.0 1.2  CALCIUM 8.4*  --  8.3*  --   --  8.4*  --   --   --   --   MG  --   --   --   --  2.0  --   --   --   --   --   < > = values in this interval not displayed.  Recent Labs  04/19/16 0309 04/27/16 0110 04/28/16 0226  AST 17 14* 17  ALT 13* 10* 10*  ALKPHOS 75 78 81  BILITOT 0.3 0.7 1.2  PROT 6.7 6.3* 6.3*  ALBUMIN 2.7* 2.6* 2.6*    Recent Labs  02/20/16 2355  04/19/16 0309 04/27/16 0110 04/27/16 0128 04/28/16 0226 07/06/16  WBC 10.1  < > 8.6 9.7  --  8.6 7.6  NEUTROABS 7.3  --  5.5 6.8  --   --   --   HGB 13.0  < > 12.4* 11.8* 11.9* 12.0* 12.9*  HCT 40.5  < > 38.5* 36.8* 35.0* 37.2* 39*  MCV 94.6  --  92.3 92.7  --  91.4  --   PLT 214  < > 313 332  --  346 206  < > = values in this interval not displayed. Lab Results  Component Value Date   TSH 5.05 08/08/2016   No results found for: HGBA1C Lab Results  Component Value Date   CHOL 155 08/31/2015   HDL 33 (A) 08/31/2015   LDLCALC 99 08/31/2015   TRIG 116 08/31/2015     Significant Diagnostic Results in last 30 days:  No results found.   Assessment and Plan  1. Loss of weight Unclear if this is due to progressive dementia, or underlying pathology He has a hx of prostate nodule Due to his age and dementia he is not a candidate for aggressive work up I have asked the staff to have nutrition review his caloric intake Continue boost supplements  2. Chronic diastolic congestive heart failure ([place]) Will continue to hold lasix, K, and aldactone Edema improved, no sob, no crackles or signs of fluid overload Staff to monitor wt and VS daily    Family/ staff Communication: discussed with staff  Labs/tests ordered:  BMP weekly on Thursday

## 2016-08-19 DIAGNOSIS — R339 Retention of urine, unspecified: Secondary | ICD-10-CM | POA: Diagnosis not present

## 2016-08-19 DIAGNOSIS — I5032 Chronic diastolic (congestive) heart failure: Secondary | ICD-10-CM | POA: Diagnosis not present

## 2016-08-22 DIAGNOSIS — I5032 Chronic diastolic (congestive) heart failure: Secondary | ICD-10-CM | POA: Diagnosis not present

## 2016-08-22 DIAGNOSIS — R339 Retention of urine, unspecified: Secondary | ICD-10-CM | POA: Diagnosis not present

## 2016-08-22 LAB — BASIC METABOLIC PANEL
BUN: 27 mg/dL — AB (ref 4–21)
Creatinine: 1.3 mg/dL (ref 0.6–1.3)
Glucose: 85 mg/dL
Potassium: 4 mmol/L (ref 3.4–5.3)
Sodium: 142 mmol/L (ref 137–147)

## 2016-08-29 DIAGNOSIS — E860 Dehydration: Secondary | ICD-10-CM | POA: Diagnosis not present

## 2016-08-29 LAB — BASIC METABOLIC PANEL
BUN: 24 mg/dL — AB (ref 4–21)
Creatinine: 1.1 mg/dL (ref 0.6–1.3)
Glucose: 94 mg/dL
Potassium: 4.5 mmol/L (ref 3.4–5.3)
Sodium: 142 mmol/L (ref 137–147)

## 2016-09-05 DIAGNOSIS — I5032 Chronic diastolic (congestive) heart failure: Secondary | ICD-10-CM | POA: Diagnosis not present

## 2016-09-05 DIAGNOSIS — R339 Retention of urine, unspecified: Secondary | ICD-10-CM | POA: Diagnosis not present

## 2016-09-05 LAB — BASIC METABOLIC PANEL
BUN: 27 mg/dL — AB (ref 4–21)
Creatinine: 1 mg/dL (ref 0.6–1.3)
Glucose: 94 mg/dL
Potassium: 4.1 mmol/L (ref 3.4–5.3)
Sodium: 144 mmol/L (ref 137–147)

## 2016-09-10 ENCOUNTER — Non-Acute Institutional Stay (SKILLED_NURSING_FACILITY): Payer: Medicare Other | Admitting: Internal Medicine

## 2016-09-10 ENCOUNTER — Encounter: Payer: Self-pay | Admitting: Internal Medicine

## 2016-09-10 DIAGNOSIS — I482 Chronic atrial fibrillation, unspecified: Secondary | ICD-10-CM

## 2016-09-10 DIAGNOSIS — F0281 Dementia in other diseases classified elsewhere with behavioral disturbance: Secondary | ICD-10-CM | POA: Diagnosis not present

## 2016-09-10 DIAGNOSIS — R1312 Dysphagia, oropharyngeal phase: Secondary | ICD-10-CM | POA: Diagnosis not present

## 2016-09-10 DIAGNOSIS — Z7901 Long term (current) use of anticoagulants: Secondary | ICD-10-CM | POA: Diagnosis not present

## 2016-09-10 DIAGNOSIS — N183 Chronic kidney disease, stage 3 unspecified: Secondary | ICD-10-CM

## 2016-09-10 DIAGNOSIS — G301 Alzheimer's disease with late onset: Secondary | ICD-10-CM | POA: Diagnosis not present

## 2016-09-10 DIAGNOSIS — I872 Venous insufficiency (chronic) (peripheral): Secondary | ICD-10-CM | POA: Diagnosis not present

## 2016-09-10 DIAGNOSIS — F02818 Dementia in other diseases classified elsewhere, unspecified severity, with other behavioral disturbance: Secondary | ICD-10-CM

## 2016-09-10 DIAGNOSIS — R627 Adult failure to thrive: Secondary | ICD-10-CM

## 2016-09-10 NOTE — Progress Notes (Signed)
Patient ID: Bryan [name], male   DOB: [birth year], 83 y.o.   MRN: [id number]  Location:  [place] Room Number: [place] of Service:  SNF (31) Provider:  Valina Maes L. Mariea [name], D.O., C.M.D.  Bryan Kinnier, DO  Patient Care Team: Bryan Curry, DO as PCP - General (Geriatric Medicine) Bryan Coburn, NP as Nurse Practitioner (Nurse Practitioner)  Extended Emergency Contact Information Primary Emergency Contact: Bryan [name] Address: [address]          [place], [place] [address] [place] Phone: [phone] Relation: Spouse Secondary Emergency Contact: Bryan [name], [place] Phone: [phone] Relation: Daughter  Code Status:  DNR Goals of care: Advanced Directive information Advanced Directives 09/10/2016  Does Patient Have a Medical Advance Directive? Yes  Type of Advance Directive Out of facility DNR (pink MOST or yellow form);[place];Living will  Does patient want to make changes to medical advance directive? -  Copy of [place] in Chart? Yes  Pre-existing out of facility DNR order (yellow form or pink MOST form) Yellow form placed in chart (order not valid for inpatient use);Pink MOST form placed in chart (order not valid for inpatient use)   Chief Complaint  Patient presents with  . Medical Management of Chronic Issues    Routine visit    HPI:  Pt is a 83 y.o. male seen today for medical management of chronic diseases.    Dementia:  Likely mix of AD and vascular given afib Unfortunately his MRI brain from 2014 is not available to confirm.  Stage 7 AD for sometime now.  Lives in memory care and has had behaviors during care.  He is minimally verbal, majority of speech cannot be understood.  His wife visits him daily.  He appears unchanged when seen today.  He was resting in  bed with his caregiver seated in the corner chair.  He was asleep, but easily aroused.  Did not speak at all today.  He requires a hoyer lift for transfers and has an air mattress for pressure ulcer prevention. Gets ENDIT cream to his buttocks daily after bathing.  Has seroquel for agitation during care.  Echo in 2015 had shown EF 50-55%, no diagnostic regional wall motion abnormalities, mild left atrial dilatation, trivial tricuspid regurgitation.  Normal PA pressure.  No mention of diastolic function though dysfunction suspected by primary team.    CKDIII: BUN/cr values have been fluctuating over the past several checks w/o any gross increase at this point.  He is currently off of a regular diuretic regimen, but occasionally has need for lasix and potassium with weight gain and evidence of volume overload. Cr 1.0 3/1 Cr 1.1 2/22 Cr 1.3 2/15 Cr 1.2 1/18 Cr 1.0 12/28 Cr 1.1 12/14 Cr 1.1 11/16 Cr 1.1 11/9 Weights are done daily but I can only find two in matrix for the month:  2.1 199.8 and 3/1 206.3 lbs.  Lasix was given on 3/1 20mg  once, 3/2 20mg  daily x 3 days and kcl 22meq daily x 3 days.  No weight on paper on desk for the day for today or in matrix weight section so not enough information.  I/Os being done also like pt in hospital with I/O 770/650 first shift and 650/550 second shift yest.  I 840/O 350 today so far. Had large BM after dulcolax.  Leg circumferences R 13.5 and left 13.8.  To be measured and recorded daily.   Pt appears euvolemic today with usual edematous lower extremities with wraps on them.    Dysphagia:  Remains on dysphagia 1 pureed with nectar thickened liquids, magic cup, strict aspiration precautions and elevation of HOB.  Gets Boost TID with thickener also for his failure to thrive.  Also has increased portions of the pureed food.    Has coude cath in place for strict I/O measurements per son's request.  It is changed every 8 weeks.  Irrigated every 8 hrs.  Bag  changed weekly and leg bag used when up in daytime.  Has flomax for bph at hs.  Chronic venous insufficiency:  Has double layer compression wraps avoiding heels applied each am and off in pm.    Chronic afib:  Remains on eliquis for stroke prevention.  No episodes of RVR recently.  Not on any bp or rate control meds due to soft bp.    Past Medical History:  Diagnosis Date  . Allergic rhinitis   . Alzheimer's disease 2013  . Atrial fibrillation ([place])    Long term anti coagulation w/ warfarin fro stroke risk reduction  . BPH (benign prostatic hyperplasia)   . Chronic venous insufficiency 01/12/2013  . Dementia with behavioral disturbance 12/29/13  . Gait disorder   . GERD (gastroesophageal reflux disease)   . History of CVA (cerebrovascular accident) 2001   Rt.MCA  . Hyperlipidemia   . Lactose intolerance   . Long term (current) use of anticoagulants 11/23/2012   Long-term anticoagulation with warfarin for stroke risk reduction related to AF   Past Surgical History:  Procedure Laterality Date  . CATARACT EXTRACTION W/ INTRAOCULAR LENS  IMPLANT, BILATERAL    . HERNIA REPAIR Right 1970s  . KNEE SURGERY Right 2000  . MASS EXCISION Left 07/30/2013   Procedure: EXCISION OF BASAL CELL CARCINOMA  LEFT FOREHEAD ;  Surgeon: Irene Limbo, MD;  Location: [facility];  Service: Plastics;  Laterality: Left;  . RETINAL DETACHMENT SURGERY Right 2009    Allergies  Allergen Reactions  . Penicillins Rash  . Bee Venom   . Risperidone And Related     Unstable gait    Allergies as of 09/10/2016      Reactions   Penicillins Rash   Bee Venom    Risperidone And Related    Unstable gait      Medication List       Accurate as of 09/10/16  2:06 PM. Always use your most recent med list.          ciclopirox 0.77 % cream Commonly known as:  LOPROX Apply topically 2 (two) times daily as needed (redness).   ELIQUIS 2.5 MG Tabs tablet Generic drug:  apixaban Take 2.5 mg by mouth  2 (two) times daily.   ipratropium-albuterol 0.5-2.5 (3) MG/3ML Soln Commonly known as:  DUONEB Take 3 mLs by nebulization every 6 (six) hours as needed (for wheezing and cough).   lactose free nutrition Liqd Take 237 mLs by mouth 2 (two) times daily between meals.   nitroGLYCERIN 0.4 MG SL tablet Commonly known as:  NITROSTAT Place 0.4 mg under the tongue every 5 (five) minutes as needed for chest pain.   polyethylene glycol packet Commonly known as:  MIRALAX / GLYCOLAX Take 17 g by mouth daily.   potassium chloride 10 MEQ tablet Commonly known as:  K-DUR Take 10 mEq by mouth daily.   QUEtiapine  25 MG tablet Commonly known as:  SEROQUEL Take 25 mg by mouth daily with breakfast.   tamsulosin 0.4 MG Caps capsule Commonly known as:  FLOMAX Take 0.4 mg by mouth at bedtime. Hold for SBP < 95       Review of Systems  Constitutional: Negative for activity change, appetite change, chills, fatigue, fever and unexpected weight change.  HENT: Positive for trouble swallowing. Negative for congestion.   Eyes: Negative for visual disturbance.  Respiratory: Negative for shortness of breath and wheezing.   Cardiovascular: Positive for leg swelling. Negative for chest pain and palpitations.       Chronic edema  Gastrointestinal: Positive for constipation. Negative for abdominal pain, blood in stool, diarrhea and vomiting.       Resolved with treatment  Genitourinary: Negative for decreased urine volume.       Coude cath in place with dark yellow urine  Musculoskeletal: Positive for gait problem.       Requires full assist with adls, in bed or specialty wheelchair, hoyer used  Skin: Negative for color change.  Neurological: Positive for weakness.  Hematological: Bruises/bleeds easily.  Psychiatric/Behavioral: Positive for agitation, behavioral problems and confusion. Negative for sleep disturbance.       End stage dementia    Immunization History  Administered Date(s) Administered   . Influenza Inj Mdck Quad Pf 04/25/2016  . Influenza Whole 04/20/2013  . Influenza-Unspecified 04/12/2014, 04/20/2015  . PPD Test 11/29/2012  . Pneumococcal Conjugate-13 08/01/2016   Pertinent  Health Maintenance Due  Topic Date Due  . PNA vac Low Risk Adult (2 of 2 - PPSV23) 08/01/2017  . INFLUENZA VACCINE  Completed   Fall Risk  06/21/2016  Falls in the past year? No   Functional Status Survey:    Vitals:   09/10/16 1347  BP: 136/64  Pulse: 66  Resp: 20  Temp: (!) 96.6 F (35.9 C)  TempSrc: Oral  SpO2: 96%   There is no height or weight on file to calculate BMI. Physical Exam  Constitutional: No distress.  Cardiovascular:  No murmur heard. irreg irreg  Pulmonary/Chest: Effort normal and breath sounds normal. He has no wheezes.  Abdominal: Soft. Bowel sounds are normal. He exhibits no distension. There is no tenderness.  Musculoskeletal:  Requires assistance to move lower extremities due to contractures and weakness, able to use hands some  Neurological: He is alert.  Was easily awoken from pm nap, unclear if he recognizes family at this point  Skin: Skin is warm and dry.  Psychiatric:  Flat affect, nonverbal today (minimally verbal at times)    Labs reviewed:  Recent Labs  04/19/16 0309  04/27/16 0110 04/27/16 0128 04/27/16 1020 04/28/16 0226  08/22/16 0133 08/29/16 0247 09/05/16 0230  NA 142  < > 141 145  --  142  < > 142 142 144  K 3.6  < > 3.6 3.9  --  2.9*  < > 4.0 4.5 4.1  CL 111  --  115* 115*  --  111  --   --   --   --   CO2 23  --  20*  --   --  24  --   --   --   --   GLUCOSE 114*  --  122* 112*  --  105*  --   --   --   --   BUN 25*  < > 22* 31*  --  19  < > 27* 24* 27*  CREATININE  1.18  < > 1.11 1.10  --  1.28*  < > 1.3 1.1 1.0  CALCIUM 8.4*  --  8.3*  --   --  8.4*  --   --   --   --   MG  --   --   --   --  2.0  --   --   --   --   --   < > = values in this interval not displayed.  Recent Labs  04/19/16 0309 04/27/16 0110  04/28/16 0226  AST 17 14* 17  ALT 13* 10* 10*  ALKPHOS 75 78 81  BILITOT 0.3 0.7 1.2  PROT 6.7 6.3* 6.3*  ALBUMIN 2.7* 2.6* 2.6*    Recent Labs  02/20/16 2355  04/19/16 0309 04/27/16 0110 04/27/16 0128 04/28/16 0226 07/06/16  WBC 10.1  < > 8.6 9.7  --  8.6 7.6  NEUTROABS 7.3  --  5.5 6.8  --   --   --   HGB 13.0  < > 12.4* 11.8* 11.9* 12.0* 12.9*  HCT 40.5  < > 38.5* 36.8* 35.0* 37.2* 39*  MCV 94.6  --  92.3 92.7  --  91.4  --   PLT 214  < > 313 332  --  346 206  < > = values in this interval not displayed. Lab Results  Component Value Date   TSH 5.05 08/08/2016   No results found for: HGBA1C Lab Results  Component Value Date   CHOL 155 08/31/2015   HDL 33 (A) 08/31/2015   LDLCALC 99 08/31/2015   TRIG 116 08/31/2015   Assessment/Plan 1. Late onset Alzheimer's disease with behavioral disturbance -end stage -not on dementia meds any longer -is dependent in adls, minimally verbal and speech is not coherent, doubt recognizing family--not able to say by name if he does, has contractures of lower extremities, has periods of poor po intake off and on -seems he would qualify for hospice--currently, we are managing him very closely with a number of typically hospital based interventions that are certainly prolonging his life including weekly labs, daily vitals, daily weights, leg circumference measures, strict I/Os with catheter, nutritional supplements, dietary modifications, aspiration precautions, pressure ulcer prevention, compression wraps to his venous insufficient legs and full adl support -?qol--pt has a "desire for a natural death" document on file--seems this is not being adhered to at this point--says "may withdraw" life sustaining measures in event of dementia, but then leaves the responsibility in his HCPOA's hands--his wife, and then his daughter and son jointly -recommend care plan with me present to discuss goals again for him with pt's wife, daughter and son  together  2. CKD (chronic kidney disease) stage 3, GFR 30-59 ml/min -has been stable with minor fluctuations affected by days of poor intake and periods of required increased diuresis  3. FTT (failure to thrive) in adult -requires supplements and encouragement to maintain adequate intake, would not likely continue to survive without this encouragement, magic cups and boost  4. Oropharyngeal dysphagia -cont modified diet and aspiration precautions  5. Chronic venous insufficiency -cont leg wraps and daily circumferences at son's request  6. Long term current use of anticoagulant therapy -cont eliquis for stroke prophylaxis due to #7  7. Chronic atrial fibrillation (HCC) -rate controlled w/o meds and bp too low to take, cont eliquis  Family/ staff Communication: discussed with memory care nurse and nurse manager  Labs/tests ordered:  No new, has weekly bmps

## 2016-09-12 DIAGNOSIS — E860 Dehydration: Secondary | ICD-10-CM | POA: Diagnosis not present

## 2016-09-19 DIAGNOSIS — R319 Hematuria, unspecified: Secondary | ICD-10-CM | POA: Diagnosis not present

## 2016-09-19 DIAGNOSIS — N179 Acute kidney failure, unspecified: Secondary | ICD-10-CM | POA: Diagnosis not present

## 2016-09-19 DIAGNOSIS — R634 Abnormal weight loss: Secondary | ICD-10-CM | POA: Diagnosis not present

## 2016-09-19 DIAGNOSIS — R339 Retention of urine, unspecified: Secondary | ICD-10-CM | POA: Diagnosis not present

## 2016-09-19 DIAGNOSIS — I5032 Chronic diastolic (congestive) heart failure: Secondary | ICD-10-CM | POA: Diagnosis not present

## 2016-09-26 ENCOUNTER — Non-Acute Institutional Stay (SKILLED_NURSING_FACILITY): Payer: Medicare Other | Admitting: Adult Health

## 2016-09-26 ENCOUNTER — Encounter: Payer: Self-pay | Admitting: Adult Health

## 2016-09-26 DIAGNOSIS — R339 Retention of urine, unspecified: Secondary | ICD-10-CM

## 2016-09-26 DIAGNOSIS — N183 Chronic kidney disease, stage 3 unspecified: Secondary | ICD-10-CM

## 2016-09-26 DIAGNOSIS — D649 Anemia, unspecified: Secondary | ICD-10-CM | POA: Diagnosis not present

## 2016-09-26 DIAGNOSIS — E039 Hypothyroidism, unspecified: Secondary | ICD-10-CM | POA: Diagnosis not present

## 2016-09-26 DIAGNOSIS — I5032 Chronic diastolic (congestive) heart failure: Secondary | ICD-10-CM | POA: Diagnosis not present

## 2016-09-26 DIAGNOSIS — E860 Dehydration: Secondary | ICD-10-CM | POA: Diagnosis not present

## 2016-09-26 DIAGNOSIS — E038 Other specified hypothyroidism: Secondary | ICD-10-CM | POA: Diagnosis not present

## 2016-09-26 LAB — BASIC METABOLIC PANEL
BUN: 30 mg/dL — AB (ref 4–21)
CREATININE: 1.3 mg/dL (ref 0.6–1.3)
Glucose: 111 mg/dL
Potassium: 4.2 mmol/L (ref 3.4–5.3)
Sodium: 144 mmol/L (ref 137–147)

## 2016-09-26 LAB — MICROALBUMIN, URINE

## 2016-09-26 NOTE — Progress Notes (Signed)
Patient ID: Bryan [name], male   DOB: [birth year], 83 y.o.   MRN: [id number]  Location:  [facility]:  SNF (31) Provider:   Cindi Carbon, ANP [place] [phone]   [name], [name], DO  Patient Care Team: [name], DO as PCP - General (Geriatric Medicine) Well Rewey, NP as Nurse Practitioner (Nurse Practitioner)  Extended Emergency Contact Information Primary Emergency Contact: [name] Address: [address]          [place], [place] [address] [place] Phone: [phone] Relation: Spouse Secondary Emergency Contact: [name], [place] Phone: [phone] Relation: Daughter  Code Status:  DNR Goals of care: Advanced Directive information Advanced Directives 09/10/2016  Does Patient Have a Medical Advance Directive? Yes  Type of Advance Directive Out of facility DNR (pink MOST or yellow form);[place];Living will  Does patient want to make changes to medical advance directive? -  Copy of [place] in Chart? Yes  Pre-existing out of facility DNR order (yellow form or pink MOST form) Yellow form placed in chart (order not valid for inpatient use);[place]ink MOST form placed in chart (order not valid for inpatient use)     Chief Complaint  Patient presents with  . Acute Visit    elevated BUN, dark urine    HPI:  Pt is a 83 y.o. male seen today for elevated BUN and dark urine.  Resides in memory care due to dementia.  Has caregivers 1:1 during the day. VS stable.  Non ambulatory and minimally verbal.  BUN/Cr was 30.1/1.3 on 3/22, up from 23.9/1.1 one month ago. UA was obtained on 09/19/16 per family request to look for cast due to elevated BUN/Cr which were not present. >100 protein noted. WBC TNTC large amt of leukocytes and a moderate amt of blood. This was  taken from the foley right after a cath change. We elected not to send for culture due to the fact that he was asymptomatic and the blood was most likely due to trauma of insertion.  Records indicate that his intake is typically 1500-1600 cc per day, fluid balance runs positive most days. Weights are fairly stable at 204 lb 09/26/2016  There is no fever, sob, decreased sats, perceived pain, etc. However, due to his hx of dementia he is not able to communicate discomfort. He is not currently diuretics for CHF due to trending upward BUN and previous weight loss. He receives prn dosing depending on his output and weight.   He has an indwelling catheter due to BPH with urinary retention.  The nurse reported that the urine appeared muddy earlier in the day.   He is currently on BMPs weekly per the son's request, who is a cardiologist.   Past Medical History:  Diagnosis Date  . Allergic rhinitis   . Alzheimer's disease 2013  . Atrial fibrillation ([place])    Long term anti coagulation w/ warfarin fro stroke risk reduction  . BPH (benign prostatic hyperplasia)   . Chronic venous insufficiency 01/12/2013  . Dementia with behavioral disturbance 12/29/13  . Gait disorder   . GERD (gastroesophageal reflux disease)   . History of CVA (cerebrovascular accident) 2001   Rt.MCA  . Hyperlipidemia   . Lactose intolerance   . Long term (current) use of anticoagulants 11/23/2012   Long-term anticoagulation with warfarin  for stroke risk reduction related to AF   Past Surgical History:  Procedure Laterality Date  . CATARACT EXTRACTION W/ INTRAOCULAR LENS  IMPLANT, BILATERAL    . HERNIA REPAIR Right 1970s  . KNEE SURGERY Right 2000  . MASS EXCISION Left 07/30/2013   Procedure: EXCISION OF BASAL CELL CARCINOMA  LEFT FOREHEAD ;  Surgeon: Irene Limbo, MD;  Location: [facility];  Service: Plastics;  Laterality: Left;  . RETINAL DETACHMENT SURGERY Right 2009    Allergies  Allergen Reactions  .  Penicillins Rash  . Bee Venom   . Risperidone And Related     Unstable gait    Allergies as of 09/26/2016      Reactions   Penicillins Rash   Bee Venom    Risperidone And Related    Unstable gait      Medication List       Accurate as of 09/26/16  4:12 PM. Always use your most recent med list.          ciclopirox 0.77 % cream Commonly known as:  LOPROX Apply topically 2 (two) times daily as needed (redness).   ELIQUIS 2.5 MG Tabs tablet Generic drug:  apixaban Take 2.5 mg by mouth 2 (two) times daily.   ipratropium-albuterol 0.5-2.5 (3) MG/3ML Soln Commonly known as:  DUONEB Take 3 mLs by nebulization every 6 (six) hours as needed (for wheezing and cough).   lactose free nutrition Liqd Take 237 mLs by mouth 2 (two) times daily between meals.   nitroGLYCERIN 0.4 MG SL tablet Commonly known as:  NITROSTAT Place 0.4 mg under the tongue every 5 (five) minutes as needed for chest pain.   polyethylene glycol packet Commonly known as:  MIRALAX / GLYCOLAX Take 17 g by mouth daily.   QUEtiapine 25 MG tablet Commonly known as:  SEROQUEL Take 25 mg by mouth daily with breakfast.   tamsulosin 0.4 MG Caps capsule Commonly known as:  FLOMAX Take 0.4 mg by mouth at bedtime. Hold for SBP < 95       Review of Systems  Unable to perform ROS: Dementia    Immunization History  Administered Date(s) Administered  . Influenza Inj Mdck Quad Pf 04/25/2016  . Influenza Whole 04/20/2013  . Influenza-Unspecified 04/12/2014, 04/20/2015  . PPD Test 11/29/2012  . Pneumococcal Conjugate-13 08/01/2016   Pertinent  Health Maintenance Due  Topic Date Due  . PNA vac Low Risk Adult (2 of 2 - PPSV23) 08/01/2017  . INFLUENZA VACCINE  Completed   Fall Risk  06/21/2016  Falls in the past year? No   Functional Status Survey:    Vitals:   09/26/16 1607  BP: 119/71  Pulse: 81  Temp: 98.7 F (37.1 C)  Weight: 204 lb 11.2 oz (92.9 kg)   Body mass index is 27.76 kg/m.  Wt  Readings from Last 3 Encounters:  09/26/16 204 lb 11.2 oz (92.9 kg)  08/16/16 200 lb (90.7 kg)  08/01/16 208 lb 14.4 oz (94.8 kg)    Physical Exam  Constitutional: No distress.  HENT:  Head: Normocephalic and atraumatic.  Eyes: Conjunctivae are normal. Pupils are equal, round, and reactive to light. Right eye exhibits no discharge. Left eye exhibits no discharge.  Neck: No JVD present.  Cardiovascular: Normal rate.   No murmur heard. BLE edema +2. Irregular rhythm  Pulmonary/Chest: Effort normal and breath sounds normal. No respiratory distress.  Abdominal: Soft. Bowel sounds are normal. He exhibits no distension. There is no tenderness.  Genitourinary:  Penis normal. No penile tenderness.  Genitourinary Comments: No scrotal swelling or tenderness.  No purulent drainage from cath. Urine in the bag is yellow with a slight cloudy appearance.   Musculoskeletal:  Rigidity to BUE and BLE  Neurological: He is alert.  Mumbles words, not able to f/c  Skin: Skin is warm and dry. He is not diaphoretic.  Psychiatric: He has a normal mood and affect.  Nursing note and vitals reviewed.   Labs reviewed:  Recent Labs  04/19/16 0309  04/27/16 0110 04/27/16 0128 04/27/16 1020 04/28/16 0226  08/29/16 0247 09/05/16 0230 09/26/16  NA 142  < > 141 145  --  142  < > 142 144 144  K 3.6  < > 3.6 3.9  --  2.9*  < > 4.5 4.1 4.2  CL 111  --  115* 115*  --  111  --   --   --   --   CO2 23  --  20*  --   --  24  --   --   --   --   GLUCOSE 114*  --  122* 112*  --  105*  --   --   --   --   BUN 25*  < > 22* 31*  --  19  < > 24* 27* 30*  CREATININE 1.18  < > 1.11 1.10  --  1.28*  < > 1.1 1.0 1.3  CALCIUM 8.4*  --  8.3*  --   --  8.4*  --   --   --   --   MG  --   --   --   --  2.0  --   --   --   --   --   < > = values in this interval not displayed.  Recent Labs  04/19/16 0309 04/27/16 0110 04/28/16 0226  AST 17 14* 17  ALT 13* 10* 10*  ALKPHOS 75 78 81  BILITOT 0.3 0.7 1.2  PROT 6.7 6.3*  6.3*  ALBUMIN 2.7* 2.6* 2.6*    Recent Labs  02/20/16 2355  04/19/16 0309 04/27/16 0110 04/27/16 0128 04/28/16 0226 07/06/16  WBC 10.1  < > 8.6 9.7  --  8.6 7.6  NEUTROABS 7.3  --  5.5 6.8  --   --   --   HGB 13.0  < > 12.4* 11.8* 11.9* 12.0* 12.9*  HCT 40.5  < > 38.5* 36.8* 35.0* 37.2* 39*  MCV 94.6  --  92.3 92.7  --  91.4  --   PLT 214  < > 313 332  --  346 206  < > = values in this interval not displayed. Lab Results  Component Value Date   TSH 5.05 08/08/2016   No results found for: HGBA1C Lab Results  Component Value Date   CHOL 155 08/31/2015   HDL 33 (A) 08/31/2015   LDLCALC 99 08/31/2015   TRIG 116 08/31/2015    Significant Diagnostic Results in last 30 days:  No results found.   Assessment and Plan  1. CKD (chronic kidney disease) stage 3, GFR 30-59 ml/min Slight worsening Given his age, debility and dementia, it would be best to avoid such frequent lab draws and further testing regarding this issue as he is asymptomatic.  However, his son would like to research further this issue.  We have ordered a renal [facility] at [facility].  I have asked the staff to encourage fluid 1800-2000cc per day, as he  usually takes in around 1500. I would avoid IVF if possible because he tends to go into overload.  I see no sign of infections during my exam.  2. Urinary retention Continue foley cath with monitoring of output  Urine characteristic was much clearer for my exam than previous described.   3. Chronic diastolic congestive heart failure (HCC) Not currently on diuretics Monitor weight daily and address accordingly Not on ACE as discussed with his son due to soft BP and lack of benefit.  Family/ staff Communication: discussed with staff  Labs/tests ordered:  BMP weekly on Thursday

## 2016-09-27 DIAGNOSIS — N179 Acute kidney failure, unspecified: Secondary | ICD-10-CM | POA: Diagnosis not present

## 2016-09-27 DIAGNOSIS — Z79899 Other long term (current) drug therapy: Secondary | ICD-10-CM | POA: Diagnosis not present

## 2016-09-27 DIAGNOSIS — N390 Urinary tract infection, site not specified: Secondary | ICD-10-CM | POA: Diagnosis not present

## 2016-09-27 DIAGNOSIS — R509 Fever, unspecified: Secondary | ICD-10-CM | POA: Diagnosis not present

## 2016-09-27 DIAGNOSIS — R319 Hematuria, unspecified: Secondary | ICD-10-CM | POA: Diagnosis not present

## 2016-09-27 DIAGNOSIS — R339 Retention of urine, unspecified: Secondary | ICD-10-CM | POA: Diagnosis not present

## 2016-09-30 DIAGNOSIS — D649 Anemia, unspecified: Secondary | ICD-10-CM | POA: Diagnosis not present

## 2016-09-30 DIAGNOSIS — N1330 Unspecified hydronephrosis: Secondary | ICD-10-CM | POA: Diagnosis not present

## 2016-09-30 LAB — CBC AND DIFFERENTIAL
HCT: 38 % — AB (ref 41–53)
Hemoglobin: 12.4 g/dL — AB (ref 13.5–17.5)
Platelets: 203 10*3/uL (ref 150–399)
WBC: 6.7 10^3/mL

## 2016-09-30 LAB — BASIC METABOLIC PANEL
BUN: 24 mg/dL — AB (ref 4–21)
Creatinine: 1.1 mg/dL (ref 0.6–1.3)
Glucose: 91 mg/dL
Potassium: 3.8 mmol/L (ref 3.4–5.3)
Sodium: 143 mmol/L (ref 137–147)

## 2016-10-03 DIAGNOSIS — R339 Retention of urine, unspecified: Secondary | ICD-10-CM | POA: Diagnosis not present

## 2016-10-03 DIAGNOSIS — D649 Anemia, unspecified: Secondary | ICD-10-CM | POA: Diagnosis not present

## 2016-10-03 DIAGNOSIS — I5032 Chronic diastolic (congestive) heart failure: Secondary | ICD-10-CM | POA: Diagnosis not present

## 2016-10-03 LAB — BASIC METABOLIC PANEL
BUN: 25 mg/dL — AB (ref 4–21)
Creatinine: 1.1 mg/dL (ref 0.6–1.3)
Glucose: 94 mg/dL
Potassium: 3.8 mmol/L (ref 3.4–5.3)
SODIUM: 143 mmol/L (ref 137–147)

## 2016-10-08 ENCOUNTER — Encounter: Payer: Self-pay | Admitting: Internal Medicine

## 2016-10-08 ENCOUNTER — Non-Acute Institutional Stay: Payer: Medicare Other | Admitting: Internal Medicine

## 2016-10-08 DIAGNOSIS — R339 Retention of urine, unspecified: Secondary | ICD-10-CM | POA: Diagnosis not present

## 2016-10-08 DIAGNOSIS — N183 Chronic kidney disease, stage 3 unspecified: Secondary | ICD-10-CM

## 2016-10-08 DIAGNOSIS — I5032 Chronic diastolic (congestive) heart failure: Secondary | ICD-10-CM

## 2016-10-08 DIAGNOSIS — F0281 Dementia in other diseases classified elsewhere with behavioral disturbance: Secondary | ICD-10-CM

## 2016-10-08 DIAGNOSIS — F02818 Dementia in other diseases classified elsewhere, unspecified severity, with other behavioral disturbance: Secondary | ICD-10-CM

## 2016-10-08 DIAGNOSIS — G301 Alzheimer's disease with late onset: Secondary | ICD-10-CM | POA: Diagnosis not present

## 2016-10-08 NOTE — Progress Notes (Signed)
Patient ID: Bryan [name], male   DOB: Oct 11, 1922, 83 y.o.   MRN: [id number]  Location:  [facility] Room Number: 932 memory care Place of Service:  SNF (31) Provider:  Naryiah Schley L. Mariea Clonts, D.O., C.M.D.  Hollace Kinnier, DO  Patient Care Team: [name], DO as PCP - General (Geriatric Medicine) Well [place], NP as Nurse Practitioner (Nurse Practitioner)  Extended Emergency Contact Information Primary Emergency Contact: Bryan [name] Address: [address]          [place], [place] [address] [place] Phone: [phone] Relation: Spouse Secondary Emergency Contact: Bryan [name], [place] Phone: [phone] Relation: Daughter  Code Status:  DNR Goals of care: Advanced Directive information Advanced Directives 10/08/2016  Does Patient Have a Medical Advance Directive? Yes  Type of Advance Directive Out of facility DNR (pink MOST or yellow form);[place];Living will  Does patient want to make changes to medical advance directive? -  Copy of [place] in Chart? Yes  Pre-existing out of facility DNR order (yellow form or pink MOST form) Yellow form placed in chart (order not valid for inpatient use);Pink MOST form placed in chart (order not valid for inpatient use)   Chief Complaint  Patient presents with  . Acute Visit    discuss labs and weights?    HPI:  Pt is a 83 y.o. male with stage 7 dementia (end stage) seen today for an acute visit for addressing latest labs and weights.  All stable.    Latest weights:   10/08/16:  210.9 lb 10/07/16:  209.7 lb 10/06/16:  211.9 lb 10/05/16:  213.2 lb 10/04/16:  206.6 lb 10/03/16:  208 lb  I/O also reviewed.  Intake excellent first shift, moderate second, low third which makes sense.    He is more talkative today, babbling and playing with the crochet  blanket over him.     Past Medical History:  Diagnosis Date  . Allergic rhinitis   . Alzheimer's disease 2013  . Atrial fibrillation ([place])    Long term anti coagulation w/ warfarin fro stroke risk reduction  . BPH (benign prostatic hyperplasia)   . Chronic venous insufficiency 01/12/2013  . Dementia with behavioral disturbance 12/29/13  . Gait disorder   . GERD (gastroesophageal reflux disease)   . History of CVA (cerebrovascular accident) 2001   Rt.MCA  . Hyperlipidemia   . Lactose intolerance   . Long term (current) use of anticoagulants 11/23/2012   Long-term anticoagulation with warfarin for stroke risk reduction related to AF   Past Surgical History:  Procedure Laterality Date  . CATARACT EXTRACTION W/ INTRAOCULAR LENS  IMPLANT, BILATERAL    . HERNIA REPAIR Right 1970s  . KNEE SURGERY Right 2000  . MASS EXCISION Left 07/30/2013   Procedure: EXCISION OF BASAL CELL CARCINOMA  LEFT FOREHEAD ;  Surgeon: Irene Limbo, MD;  Location: [facility];  Service: Plastics;  Laterality: Left;  . RETINAL DETACHMENT SURGERY Right 2009    Allergies  Allergen Reactions  . Penicillins Rash  . Bee Venom   . Risperidone And Related     Unstable gait    Allergies as of 10/08/2016      Reactions   Penicillins Rash   Bee Venom    Risperidone And Related    Unstable gait      Medication List  Accurate as of 10/08/16  2:53 PM. Always use your most recent med list.          ciclopirox 0.77 % cream Commonly known as:  LOPROX Apply topically 2 (two) times daily as needed (redness).   ELIQUIS 2.5 MG Tabs tablet Generic drug:  apixaban Take 2.5 mg by mouth 2 (two) times daily.   ipratropium-albuterol 0.5-2.5 (3) MG/3ML Soln Commonly known as:  DUONEB Take 3 mLs by nebulization every 6 (six) hours as needed (for wheezing and cough).   lactose free nutrition Liqd Take 237 mLs by mouth 2 (two) times daily between meals.   nitroGLYCERIN 0.4 MG SL tablet Commonly  known as:  NITROSTAT Place 0.4 mg under the tongue every 5 (five) minutes as needed for chest pain.   polyethylene glycol packet Commonly known as:  MIRALAX / GLYCOLAX Take 17 g by mouth daily.   QUEtiapine 25 MG tablet Commonly known as:  SEROQUEL Take 25 mg by mouth daily with breakfast.   tamsulosin 0.4 MG Caps capsule Commonly known as:  FLOMAX Take 0.4 mg by mouth at bedtime. Hold for SBP < 95       Review of Systems  Constitutional: Negative for activity change, appetite change and unexpected weight change.  HENT: Negative for congestion.   Respiratory: Negative for chest tightness and shortness of breath.   Cardiovascular: Negative for chest pain, palpitations and leg swelling.  Gastrointestinal: Negative for abdominal pain.  Genitourinary: Negative for dysuria.       Catheter in place draining dark yellow urine in leg bag  Musculoskeletal: Positive for gait problem.  Skin:       Bunny boots in place to protect heels  Neurological: Positive for weakness. Negative for dizziness.  Psychiatric/Behavioral: Positive for confusion.    Immunization History  Administered Date(s) Administered  . Influenza Inj Mdck Quad Pf 04/25/2016  . Influenza Whole 04/20/2013  . Influenza-Unspecified 04/12/2014, 04/20/2015  . PPD Test 11/29/2012  . Pneumococcal Conjugate-13 08/01/2016   Pertinent  Health Maintenance Due  Topic Date Due  . INFLUENZA VACCINE  02/05/2017  . PNA vac Low Risk Adult (2 of 2 - PPSV23) 08/01/2017   Fall Risk  06/21/2016  Falls in the past year? No   Functional Status Survey:    Vitals:   10/08/16 1310  BP: 114/66  Pulse: 68  Resp: 16  Temp: 97.4 F (36.3 C)  TempSrc: Oral  SpO2: 99%  Weight: 210 lb 14.4 oz (95.7 kg)   Body mass index is 28.6 kg/m. Physical Exam  Constitutional: He appears well-developed. No distress.  Cardiovascular:  irreg irreg  Pulmonary/Chest: Effort normal and breath sounds normal. He has no rales.  Abdominal: Soft.  Bowel sounds are normal.  Musculoskeletal:  Contracted lower extremities  Neurological: He is alert.  Talkative and babbly  Skin: Skin is warm and dry.  Bunny boots in place    Labs reviewed:  Recent Labs  04/19/16 0309  04/27/16 0110 04/27/16 0128 04/27/16 1020 04/28/16 0226  09/26/16 09/30/16 0630 10/03/16  NA 142  < > 141 145  --  142  < > 144 143 143  K 3.6  < > 3.6 3.9  --  2.9*  < > 4.2 3.8 3.8  CL 111  --  115* 115*  --  111  --   --   --   --   CO2 23  --  20*  --   --  24  --   --   --   --  GLUCOSE 114*  --  122* 112*  --  105*  --   --   --   --   BUN 25*  < > 22* 31*  --  19  < > 30* 24* 25*  CREATININE 1.18  < > 1.11 1.10  --  1.28*  < > 1.3 1.1 1.1  CALCIUM 8.4*  --  8.3*  --   --  8.4*  --   --   --   --   MG  --   --   --   --  2.0  --   --   --   --   --   < > = values in this interval not displayed.  Recent Labs  04/19/16 0309 04/27/16 0110 04/28/16 0226  AST 17 14* 17  ALT 13* 10* 10*  ALKPHOS 75 78 81  BILITOT 0.3 0.7 1.2  PROT 6.7 6.3* 6.3*  ALBUMIN 2.7* 2.6* 2.6*    Recent Labs  02/20/16 2355  04/19/16 0309 04/27/16 0110  04/28/16 0226 07/06/16 09/30/16 0630  WBC 10.1  < > 8.6 9.7  --  8.6 7.6 6.7  NEUTROABS 7.3  --  5.5 6.8  --   --   --   --   HGB 13.0  < > 12.4* 11.8*  < > 12.0* 12.9* 12.4*  HCT 40.5  < > 38.5* 36.8*  < > 37.2* 39* 38*  MCV 94.6  --  92.3 92.7  --  91.4  --   --   PLT 214  < > 313 332  --  346 206 203  < > = values in this interval not displayed. Lab Results  Component Value Date   TSH 5.05 08/08/2016   No results found for: HGBA1C Lab Results  Component Value Date   CHOL 155 08/31/2015   HDL 33 (A) 08/31/2015   LDLCALC 99 08/31/2015   TRIG 116 08/31/2015   Assessment/Plan 1. CKD (chronic kidney disease) stage 3, GFR 30-59 ml/min -cont to monitor, next bmp thursday -NO CHANGES today  2. Chronic diastolic congestive heart failure (HCC) -not on regular diuretics, weight fluctuating as above, apparently  weighed sometimes with big afghan on him at times  3. Urinary retention -cont foley with I/Os per son's request, foley with dark yellow urine today  4. Late onset Alzheimer's disease with behavioral disturbance -cont supportive memory care, seroquel for agitation with care  Family/ staff Communication: discussed with memory care nurse  Labs/tests ordered:  Regular thurs bmp

## 2016-10-10 DIAGNOSIS — I5032 Chronic diastolic (congestive) heart failure: Secondary | ICD-10-CM | POA: Diagnosis not present

## 2016-10-10 DIAGNOSIS — R339 Retention of urine, unspecified: Secondary | ICD-10-CM | POA: Diagnosis not present

## 2016-10-10 DIAGNOSIS — D649 Anemia, unspecified: Secondary | ICD-10-CM | POA: Diagnosis not present

## 2016-10-10 LAB — BASIC METABOLIC PANEL
BUN: 26 mg/dL — AB (ref 4–21)
CREATININE: 1.1 mg/dL (ref 0.6–1.3)
Glucose: 96 mg/dL
Potassium: 3.5 mmol/L (ref 3.4–5.3)
Sodium: 144 mmol/L (ref 137–147)

## 2016-10-14 ENCOUNTER — Non-Acute Institutional Stay (SKILLED_NURSING_FACILITY): Payer: Medicare Other | Admitting: Adult Health

## 2016-10-14 ENCOUNTER — Encounter: Payer: Self-pay | Admitting: Adult Health

## 2016-10-14 DIAGNOSIS — G301 Alzheimer's disease with late onset: Secondary | ICD-10-CM | POA: Diagnosis not present

## 2016-10-14 DIAGNOSIS — F02818 Dementia in other diseases classified elsewhere, unspecified severity, with other behavioral disturbance: Secondary | ICD-10-CM

## 2016-10-14 DIAGNOSIS — I5032 Chronic diastolic (congestive) heart failure: Secondary | ICD-10-CM

## 2016-10-14 DIAGNOSIS — R0989 Other specified symptoms and signs involving the circulatory and respiratory systems: Secondary | ICD-10-CM | POA: Diagnosis not present

## 2016-10-14 DIAGNOSIS — N1330 Unspecified hydronephrosis: Secondary | ICD-10-CM

## 2016-10-14 DIAGNOSIS — F0281 Dementia in other diseases classified elsewhere with behavioral disturbance: Secondary | ICD-10-CM | POA: Diagnosis not present

## 2016-10-14 NOTE — Progress Notes (Signed)
Location:  [facility] of Service:  SNF (31) Provider:   Cindi Carbon, ANP [place] [phone]   [name], [name], DO  Patient Care Team: [name], DO as PCP - General (Geriatric Medicine) Well [employer], NP as Nurse Practitioner (Nurse Practitioner)  Extended Emergency Contact Information Primary Emergency Contact: Bryan [name] Address: [address]          [place], [place] [address] [place] Phone: [phone] Relation: Spouse Secondary Emergency Contact: Bryan [name], [place] Phone: [phone] Relation: Daughter  Code Status:  DNR Goals of care: Advanced Directive information Advanced Directives 10/08/2016  Does Patient Have a Medical Advance Directive? Yes  Type of Advance Directive Out of facility DNR (pink MOST or yellow form);[place];Living will  Does patient want to make changes to medical advance directive? -  Copy of [place] in Chart? Yes  Pre-existing out of facility DNR order (yellow form or pink MOST form) Yellow form placed in chart (order not valid for inpatient use);Pink MOST form placed in chart (order not valid for inpatient use)     Chief Complaint  Patient presents with  . Acute Visit    evaluate weight    HPI:  Bryan [name] is a 83 y.o. male seen today for an acute visit for review of weight, decreased intake at breakfast, runny nose.  His care taker reports that he took in only 25% of breakfast and was sleepy this am. He is mostly nonverbal and sleeps periods of the day due to his dementia, this was a slightly worse variation of his baseline.   He has a runny nose with clear drainage and mild non productive cough for 3 days.  No fever or decreased 02 sats.  Weight 213 lbs on 4/5, received 3 doses of Lasix 20 mg, now weight 210.6 lbs  Treated for UTI  with Cipro on 3/23 for 7 days urine cx grew >100,000 colonies of Citrobacter and 50,000 colonies of Enterococcus sensitive to Cipro.   3/23 renal ultrasound showed significant left sided hydronephrosis and hydroureter. His son, Bryan [name], was notified. No intervention was recommended given his advanced age, poor quality of life, and dementia.    Past Medical History:  Diagnosis Date  . Allergic rhinitis   . Alzheimer's disease 2013  . Atrial fibrillation ([place])    Long term anti coagulation w/ warfarin fro stroke risk reduction  . BPH (benign prostatic hyperplasia)   . Chronic venous insufficiency 01/12/2013  . Dementia with behavioral disturbance 12/29/13  . Gait disorder   . GERD (gastroesophageal reflux disease)   . History of CVA (cerebrovascular accident) 2001   Rt.MCA  . Hyperlipidemia   . Lactose intolerance   . Long term (current) use of anticoagulants 11/23/2012   Long-term anticoagulation with warfarin for stroke risk reduction related to AF   Past Surgical History:  Procedure Laterality Date  . CATARACT EXTRACTION W/ INTRAOCULAR LENS  IMPLANT, BILATERAL    . HERNIA REPAIR Right 1970s  . KNEE SURGERY Right 2000  . MASS EXCISION Left 07/30/2013   Procedure: EXCISION OF BASAL CELL CARCINOMA  LEFT FOREHEAD ;  Surgeon: Irene Limbo, MD;  Location: [facility];  Service: Plastics;  Laterality: Left;  . RETINAL DETACHMENT SURGERY Right 2009    Allergies  Allergen Reactions  . Penicillins Rash  .  Bee Venom   . Risperidone And Related     Unstable gait    Allergies as of 10/14/2016      Reactions   Penicillins Rash   Bee Venom    Risperidone And Related    Unstable gait      Medication List       Accurate as of 10/14/16 10:29 AM. Always use your most recent med list.          ciclopirox 0.77 % cream Commonly known as:  LOPROX Apply topically 2 (two) times daily as needed (redness).   ELIQUIS 2.5 MG Tabs tablet Generic drug:  apixaban Take 2.5 mg  by mouth 2 (two) times daily.   ipratropium-albuterol 0.5-2.5 (3) MG/3ML Soln Commonly known as:  DUONEB Take 3 mLs by nebulization every 6 (six) hours as needed (for wheezing and cough).   lactose free nutrition Liqd Take 237 mLs by mouth 2 (two) times daily between meals.   nitroGLYCERIN 0.4 MG SL tablet Commonly known as:  NITROSTAT Place 0.4 mg under the tongue every 5 (five) minutes as needed for chest pain.   polyethylene glycol packet Commonly known as:  MIRALAX / GLYCOLAX Take 17 g by mouth daily.   QUEtiapine 25 MG tablet Commonly known as:  SEROQUEL Take 25 mg by mouth daily with breakfast.   tamsulosin 0.4 MG Caps capsule Commonly known as:  FLOMAX Take 0.4 mg by mouth at bedtime. Hold for SBP < 95       Review of Systems  Unable to perform ROS: Dementia    Immunization History  Administered Date(s) Administered  . Influenza Inj Mdck Quad Pf 04/25/2016  . Influenza Whole 04/20/2013  . Influenza-Unspecified 04/12/2014, 04/20/2015  . PPD Test 11/29/2012  . Pneumococcal Conjugate-13 08/01/2016   Pertinent  Health Maintenance Due  Topic Date Due  . INFLUENZA VACCINE  02/05/2017  . PNA vac Low Risk Adult (2 of 2 - PPSV23) 08/01/2017   Fall Risk  06/21/2016  Falls in the past year? No   Functional Status Survey:    Vitals:   10/14/16 1017  BP: (!) 100/57  Pulse: 88  Resp: 20  SpO2: 99%  Weight: 210 lb 9.6 oz (95.5 kg)   Body mass index is 28.56 kg/m. Physical Exam  Constitutional: No distress.  HENT:  Head: Normocephalic and atraumatic.  Right Ear: External ear normal.  Left Ear: External ear normal.  Mouth/Throat: Oropharynx is clear and moist. No oropharyngeal exudate.  Clear drainage to both nares  Neck: Normal range of motion. Neck supple. No JVD present. No tracheal deviation present. No thyromegaly present.  Cardiovascular: Normal rate.   No murmur heard. Irregular BLE edema +4  Pulmonary/Chest: Effort normal and breath sounds  normal. No respiratory distress. He has no wheezes.  Abdominal: Soft. Bowel sounds are normal. He exhibits no distension. There is no tenderness.  Lymphadenopathy:    He has no cervical adenopathy.  Neurological:  Eyes closed but opens with sternal rub. Not able to follow commands at baseline. No obvious focal deficit.  Skin: Skin is warm and dry. He is not diaphoretic.  Nursing note and vitals reviewed.   Labs reviewed:  Recent Labs  04/19/16 0309  04/27/16 0110 04/27/16 0128 04/27/16 1020 04/28/16 0226  09/26/16 09/30/16 0630 10/03/16  NA 142  < > 141 145  --  142  < > 144 143 143  K 3.6  < > 3.6 3.9  --  2.9*  < > 4.2 3.8 3.8  CL 111  --  115* 115*  --  111  --   --   --   --   CO2 23  --  20*  --   --  24  --   --   --   --   GLUCOSE 114*  --  122* 112*  --  105*  --   --   --   --   BUN 25*  < > 22* 31*  --  19  < > 30* 24* 25*  CREATININE 1.18  < > 1.11 1.10  --  1.28*  < > 1.3 1.1 1.1  CALCIUM 8.4*  --  8.3*  --   --  8.4*  --   --   --   --   MG  --   --   --   --  2.0  --   --   --   --   --   < > = values in this interval not displayed.  Recent Labs  04/19/16 0309 04/27/16 0110 04/28/16 0226  AST 17 14* 17  ALT 13* 10* 10*  ALKPHOS 75 78 81  BILITOT 0.3 0.7 1.2  PROT 6.7 6.3* 6.3*  ALBUMIN 2.7* 2.6* 2.6*    Recent Labs  02/20/16 2355  04/19/16 0309 04/27/16 0110  04/28/16 0226 07/06/16 09/30/16 0630  WBC 10.1  < > 8.6 9.7  --  8.6 7.6 6.7  NEUTROABS 7.3  --  5.5 6.8  --   --   --   --   HGB 13.0  < > 12.4* 11.8*  < > 12.0* 12.9* 12.4*  HCT 40.5  < > 38.5* 36.8*  < > 37.2* 39* 38*  MCV 94.6  --  92.3 92.7  --  91.4  --   --   PLT 214  < > 313 332  --  346 206 203  < > = values in this interval not displayed. Lab Results  Component Value Date   TSH 5.05 08/08/2016   No results found for: HGBA1C Lab Results  Component Value Date   CHOL 155 08/31/2015   HDL 33 (A) 08/31/2015   LDLCALC 99 08/31/2015   TRIG 116 08/31/2015    Significant  Diagnostic Results in last 30 days:  No results found.  Assessment/Plan  1. Runny nose Has a hx of allergic rhinitis, no signs of resp distress, fever, decreased sats  Continue to monitor VS and report if further changes  2. Chronic diastolic congestive heart failure (HCC) Weight down 3 lbs, Will hold off on further diuresis due to decreased intake today  3. Late onset Alzheimer's disease with behavioral disturbance Severe with variable food intake No aggressive measures, staff to report if weight trends down or intake continues to decrease  4. Hydronephrosis determined by ultrasound Noted ( no pain or fever) Maintain foley No intervention at this point. I discussed this with his son Bryan [name] and he will let us know if he wants use to make a referral to urology  Family/ staff Communication: discussed with staff  Labs/tests ordered:  BMP weekly

## 2016-10-17 DIAGNOSIS — I5032 Chronic diastolic (congestive) heart failure: Secondary | ICD-10-CM | POA: Diagnosis not present

## 2016-10-17 DIAGNOSIS — R339 Retention of urine, unspecified: Secondary | ICD-10-CM | POA: Diagnosis not present

## 2016-10-17 DIAGNOSIS — D649 Anemia, unspecified: Secondary | ICD-10-CM | POA: Diagnosis not present

## 2016-10-18 DIAGNOSIS — E876 Hypokalemia: Secondary | ICD-10-CM | POA: Diagnosis not present

## 2016-10-18 DIAGNOSIS — D649 Anemia, unspecified: Secondary | ICD-10-CM | POA: Diagnosis not present

## 2016-10-19 DIAGNOSIS — D649 Anemia, unspecified: Secondary | ICD-10-CM | POA: Diagnosis not present

## 2016-10-19 DIAGNOSIS — E860 Dehydration: Secondary | ICD-10-CM | POA: Diagnosis not present

## 2016-10-19 LAB — BASIC METABOLIC PANEL
BUN: 28 mg/dL — AB (ref 4–21)
CREATININE: 1.1 mg/dL (ref 0.6–1.3)
GLUCOSE: 94 mg/dL
POTASSIUM: 3.4 mmol/L (ref 3.4–5.3)
Sodium: 148 mmol/L — AB (ref 137–147)

## 2016-10-21 ENCOUNTER — Non-Acute Institutional Stay (SKILLED_NURSING_FACILITY): Payer: Medicare Other | Admitting: Adult Health

## 2016-10-21 ENCOUNTER — Encounter: Payer: Self-pay | Admitting: Adult Health

## 2016-10-21 DIAGNOSIS — I5032 Chronic diastolic (congestive) heart failure: Secondary | ICD-10-CM | POA: Diagnosis not present

## 2016-10-21 DIAGNOSIS — D649 Anemia, unspecified: Secondary | ICD-10-CM | POA: Diagnosis not present

## 2016-10-21 DIAGNOSIS — E870 Hyperosmolality and hypernatremia: Secondary | ICD-10-CM

## 2016-10-21 DIAGNOSIS — E860 Dehydration: Secondary | ICD-10-CM | POA: Diagnosis not present

## 2016-10-21 NOTE — Progress Notes (Signed)
Location:  Occupational psychologist of Service:  ALF (13) Provider:   Cindi Carbon, ANP [place] [phone]   [name], [name], DO  Patient Care Team: [name], DO as PCP - General (Geriatric Medicine) Well Burleson, NP as Nurse Practitioner (Nurse Practitioner)  Extended Emergency Contact Information Primary Emergency Contact: [name] Address: [address]          [place], [place] [address] [place] Phone: [phone] Relation: Spouse Secondary Emergency Contact: [name], [place] Phone: [phone] Relation: Daughter  Code Status:  DNR Goals of care: Advanced Directive information Advanced Directives 10/08/2016  Does Patient Have a Medical Advance Directive? Yes  Type of Advance Directive Out of facility DNR (pink MOST or yellow form);[place];Living will  Does patient want to make changes to medical advance directive? -  Copy of [place] in Chart? Yes  Pre-existing out of facility DNR order (yellow form or pink MOST form) Yellow form placed in chart (order not valid for inpatient use);Pink MOST form placed in chart (order not valid for inpatient use)     Chief Complaint  Patient presents with  . Acute Visit    hypernatremia    HPI:  Pt is a 83 y.o. male seen today for an acute visit for elevated NA, weight up 3 lbs.   His care taker reports he is eating less over time, 25% at breakfast, but can consume 100% of dinner.    His NA 148, BUN/Cr 28/1.1 on 10/19/16  His weight is trending upward, currently 213 lbs, up 3 lbs.  No sob, sats WNL.  He has a hx of diastolic CHF, venous insuff. He receives prn doses of diuretics when he gains > 3lbs  Treated for UTI with Cipro on 3/23 for 7 days urine cx grew >100,000 colonies of Citrobacter and 50,000 colonies of Enterococcus  sensitive to Cipro. He has a chronic indwelling foley due to BPH with retention. 3/23 renal ultrasound showed significant left sided hydronephrosis and hydroureter. His son, [name], was notified. No intervention was recommended given his advanced age, poor quality of life, and dementia. Staff deny any issues with his catheter or urinary symptoms which is difficult to assess due to his dementia.     Past Medical History:  Diagnosis Date  . Allergic rhinitis   . Alzheimer's disease 2013  . Atrial fibrillation ([place])    Long term anti coagulation w/ warfarin fro stroke risk reduction  . BPH (benign prostatic hyperplasia)   . Chronic venous insufficiency 01/12/2013  . Dementia with behavioral disturbance 12/29/13  . Gait disorder   . GERD (gastroesophageal reflux disease)   . History of CVA (cerebrovascular accident) 2001   Rt.MCA  . Hyperlipidemia   . Lactose intolerance   . Long term (current) use of anticoagulants 11/23/2012   Long-term anticoagulation with warfarin for stroke risk reduction related to AF   Past Surgical History:  Procedure Laterality Date  . CATARACT EXTRACTION W/ INTRAOCULAR LENS  IMPLANT, BILATERAL    . HERNIA REPAIR Right 1970s  . KNEE SURGERY Right 2000  . MASS EXCISION Left 07/30/2013   Procedure: EXCISION OF BASAL CELL CARCINOMA  LEFT FOREHEAD ;  Surgeon: Irene Limbo, MD;  Location: [facility];  Service: Plastics;  Laterality: Left;  . RETINAL DETACHMENT SURGERY Right 2009  Allergies  Allergen Reactions  . Penicillins Rash  . Bee Venom   . Risperidone And Related     Unstable gait    Allergies as of 10/21/2016      Reactions   Penicillins Rash   Bee Venom    Risperidone And Related    Unstable gait      Medication List       Accurate as of 10/21/16  3:39 PM. Always use your most recent med list.          ciclopirox 0.77 % cream Commonly known as:  LOPROX Apply topically 2 (two) times daily as needed (redness).   ELIQUIS  2.5 MG Tabs tablet Generic drug:  apixaban Take 2.5 mg by mouth 2 (two) times daily.   ipratropium-albuterol 0.5-2.5 (3) MG/3ML Soln Commonly known as:  DUONEB Take 3 mLs by nebulization every 6 (six) hours as needed (for wheezing and cough).   lactose free nutrition Liqd Take 237 mLs by mouth 2 (two) times daily between meals.   nitroGLYCERIN 0.4 MG SL tablet Commonly known as:  NITROSTAT Place 0.4 mg under the tongue every 5 (five) minutes as needed for chest pain.   polyethylene glycol packet Commonly known as:  MIRALAX / GLYCOLAX Take 17 g by mouth daily.   QUEtiapine 25 MG tablet Commonly known as:  SEROQUEL Take 25 mg by mouth daily with breakfast.   tamsulosin 0.4 MG Caps capsule Commonly known as:  FLOMAX Take 0.4 mg by mouth at bedtime. Hold for SBP < 95       Review of Systems  Unable to perform ROS: Dementia    Immunization History  Administered Date(s) Administered  . Influenza Inj Mdck Quad Pf 04/25/2016  . Influenza Whole 04/20/2013  . Influenza-Unspecified 04/12/2014, 04/20/2015  . PPD Test 11/29/2012  . Pneumococcal Conjugate-13 08/01/2016   Pertinent  Health Maintenance Due  Topic Date Due  . INFLUENZA VACCINE  02/05/2017  . PNA vac Low Risk Adult (2 of 2 - PPSV23) 08/01/2017   Fall Risk  06/21/2016  Falls in the past year? No   Functional Status Survey:    Vitals:   10/21/16 1129  BP: 127/77  Pulse: 62  Resp: 18  Temp: 97 F (36.1 C)  SpO2: 96%   There is no height or weight on file to calculate BMI. Physical Exam  Constitutional: No distress.  HENT:  Head: Normocephalic and atraumatic.  Neck: Normal range of motion. Neck supple. No JVD present. No tracheal deviation present. No thyromegaly present.  Cardiovascular: Normal rate.   No murmur heard. Irregular BLE edema +4  Pulmonary/Chest: Effort normal and breath sounds normal. No respiratory distress. He has no wheezes.  Abdominal: Soft. Bowel sounds are normal. He exhibits no  distension. There is no tenderness.  Lymphadenopathy:    He has no cervical adenopathy.  Neurological: He is alert.  Not able to f/c  Skin: Skin is warm and dry. He is not diaphoretic.  Psychiatric: He has a normal mood and affect.  Nursing note and vitals reviewed.   Labs reviewed:  Recent Labs  04/19/16 0309  04/27/16 0110 04/27/16 0128 04/27/16 1020 04/28/16 0226  10/03/16 10/10/16 10/19/16  NA 142  < > 141 145  --  142  < > 143 144 148*  K 3.6  < > 3.6 3.9  --  2.9*  < > 3.8 3.5 3.4  CL 111  --  115* 115*  --  111  --   --   --   --  CO2 23  --  20*  --   --  24  --   --   --   --   GLUCOSE 114*  --  122* 112*  --  105*  --   --   --   --   BUN 25*  < > 22* 31*  --  19  < > 25* 26* 28*  CREATININE 1.18  < > 1.11 1.10  --  1.28*  < > 1.1 1.1 1.1  CALCIUM 8.4*  --  8.3*  --   --  8.4*  --   --   --   --   MG  --   --   --   --  2.0  --   --   --   --   --   < > = values in this interval not displayed.  Recent Labs  04/19/16 0309 04/27/16 0110 04/28/16 0226  AST 17 14* 17  ALT 13* 10* 10*  ALKPHOS 75 78 81  BILITOT 0.3 0.7 1.2  PROT 6.7 6.3* 6.3*  ALBUMIN 2.7* 2.6* 2.6*    Recent Labs  02/20/16 2355  04/19/16 0309 04/27/16 0110  04/28/16 0226 07/06/16 09/30/16 0630  WBC 10.1  < > 8.6 9.7  --  8.6 7.6 6.7  NEUTROABS 7.3  --  5.5 6.8  --   --   --   --   HGB 13.0  < > 12.4* 11.8*  < > 12.0* 12.9* 12.4*  HCT 40.5  < > 38.5* 36.8*  < > 37.2* 39* 38*  MCV 94.6  --  92.3 92.7  --  91.4  --   --   PLT 214  < > 313 332  --  346 206 203  < > = values in this interval not displayed. Lab Results  Component Value Date   TSH 5.05 08/08/2016   No results found for: HGBA1C Lab Results  Component Value Date   CHOL 155 08/31/2015   HDL 33 (A) 08/31/2015   LDLCALC 99 08/31/2015   TRIG 116 08/31/2015    Significant Diagnostic Results in last 30 days:  No results found.  Assessment/Plan  1) Hypernatremia He is not symptomatic but seems to be eating  progressively less over time which is expected given his advanced dementia. Will ask the staff to encourage fluid an recheck BMP in am Of note he has left kidney hydronephrosis and is at risk for complications.  I hesitate to begin IVF given his advanced dementia and risk for fluid overload  2) Diastolic CHF Has 3 lb weight gain but is asymptomatic. I am going to hold off on diuresis at this point due to the rising Sodium.   Continue daily weights and compression wraps.  Family/ staff Communication: discussed with staff  Labs/tests ordered:  BMP weekly

## 2016-10-22 DIAGNOSIS — R339 Retention of urine, unspecified: Secondary | ICD-10-CM | POA: Diagnosis not present

## 2016-10-22 DIAGNOSIS — D649 Anemia, unspecified: Secondary | ICD-10-CM | POA: Diagnosis not present

## 2016-10-22 DIAGNOSIS — N179 Acute kidney failure, unspecified: Secondary | ICD-10-CM | POA: Diagnosis not present

## 2016-10-23 DIAGNOSIS — R609 Edema, unspecified: Secondary | ICD-10-CM | POA: Diagnosis not present

## 2016-10-23 DIAGNOSIS — M24561 Contracture, right knee: Secondary | ICD-10-CM | POA: Diagnosis not present

## 2016-10-23 DIAGNOSIS — M6389 Disorders of muscle in diseases classified elsewhere, multiple sites: Secondary | ICD-10-CM | POA: Diagnosis not present

## 2016-10-23 DIAGNOSIS — R278 Other lack of coordination: Secondary | ICD-10-CM | POA: Diagnosis not present

## 2016-10-23 DIAGNOSIS — R293 Abnormal posture: Secondary | ICD-10-CM | POA: Diagnosis not present

## 2016-10-23 DIAGNOSIS — L89619 Pressure ulcer of right heel, unspecified stage: Secondary | ICD-10-CM | POA: Diagnosis not present

## 2016-10-24 DIAGNOSIS — R278 Other lack of coordination: Secondary | ICD-10-CM | POA: Diagnosis not present

## 2016-10-24 DIAGNOSIS — L89619 Pressure ulcer of right heel, unspecified stage: Secondary | ICD-10-CM | POA: Diagnosis not present

## 2016-10-24 DIAGNOSIS — D649 Anemia, unspecified: Secondary | ICD-10-CM | POA: Diagnosis not present

## 2016-10-24 DIAGNOSIS — R293 Abnormal posture: Secondary | ICD-10-CM | POA: Diagnosis not present

## 2016-10-24 DIAGNOSIS — R339 Retention of urine, unspecified: Secondary | ICD-10-CM | POA: Diagnosis not present

## 2016-10-24 DIAGNOSIS — M24561 Contracture, right knee: Secondary | ICD-10-CM | POA: Diagnosis not present

## 2016-10-24 DIAGNOSIS — I5032 Chronic diastolic (congestive) heart failure: Secondary | ICD-10-CM | POA: Diagnosis not present

## 2016-10-24 DIAGNOSIS — M6389 Disorders of muscle in diseases classified elsewhere, multiple sites: Secondary | ICD-10-CM | POA: Diagnosis not present

## 2016-10-24 DIAGNOSIS — R609 Edema, unspecified: Secondary | ICD-10-CM | POA: Diagnosis not present

## 2016-10-28 DIAGNOSIS — R293 Abnormal posture: Secondary | ICD-10-CM | POA: Diagnosis not present

## 2016-10-28 DIAGNOSIS — L89619 Pressure ulcer of right heel, unspecified stage: Secondary | ICD-10-CM | POA: Diagnosis not present

## 2016-10-28 DIAGNOSIS — R278 Other lack of coordination: Secondary | ICD-10-CM | POA: Diagnosis not present

## 2016-10-28 DIAGNOSIS — R609 Edema, unspecified: Secondary | ICD-10-CM | POA: Diagnosis not present

## 2016-10-28 DIAGNOSIS — M6389 Disorders of muscle in diseases classified elsewhere, multiple sites: Secondary | ICD-10-CM | POA: Diagnosis not present

## 2016-10-28 DIAGNOSIS — M24561 Contracture, right knee: Secondary | ICD-10-CM | POA: Diagnosis not present

## 2016-10-29 DIAGNOSIS — R278 Other lack of coordination: Secondary | ICD-10-CM | POA: Diagnosis not present

## 2016-10-29 DIAGNOSIS — R609 Edema, unspecified: Secondary | ICD-10-CM | POA: Diagnosis not present

## 2016-10-29 DIAGNOSIS — R293 Abnormal posture: Secondary | ICD-10-CM | POA: Diagnosis not present

## 2016-10-29 DIAGNOSIS — M6389 Disorders of muscle in diseases classified elsewhere, multiple sites: Secondary | ICD-10-CM | POA: Diagnosis not present

## 2016-10-29 DIAGNOSIS — M24561 Contracture, right knee: Secondary | ICD-10-CM | POA: Diagnosis not present

## 2016-10-29 DIAGNOSIS — L89619 Pressure ulcer of right heel, unspecified stage: Secondary | ICD-10-CM | POA: Diagnosis not present

## 2016-10-31 ENCOUNTER — Encounter: Payer: Self-pay | Admitting: Adult Health

## 2016-10-31 ENCOUNTER — Non-Acute Institutional Stay (SKILLED_NURSING_FACILITY): Payer: Medicare Other | Admitting: Adult Health

## 2016-10-31 DIAGNOSIS — E876 Hypokalemia: Secondary | ICD-10-CM | POA: Diagnosis not present

## 2016-10-31 DIAGNOSIS — I5032 Chronic diastolic (congestive) heart failure: Secondary | ICD-10-CM

## 2016-10-31 DIAGNOSIS — D649 Anemia, unspecified: Secondary | ICD-10-CM | POA: Diagnosis not present

## 2016-10-31 DIAGNOSIS — R059 Cough, unspecified: Secondary | ICD-10-CM

## 2016-10-31 DIAGNOSIS — R339 Retention of urine, unspecified: Secondary | ICD-10-CM | POA: Diagnosis not present

## 2016-10-31 DIAGNOSIS — R05 Cough: Secondary | ICD-10-CM

## 2016-10-31 LAB — BASIC METABOLIC PANEL
BUN: 24 mg/dL — AB (ref 4–21)
CREATININE: 1.1 mg/dL (ref 0.6–1.3)
GLUCOSE: 115 mg/dL
POTASSIUM: 2.7 mmol/L — AB (ref 3.4–5.3)
SODIUM: 144 mmol/L (ref 137–147)

## 2016-10-31 NOTE — Progress Notes (Signed)
Location:  Occupational psychologist of Service:  ALF (13) Provider:   Cindi Carbon, ANP [place] [phone]   [name], [name], DO  Patient Care Team: [name], DO as PCP - General (Geriatric Medicine) Well [place], NP as Nurse Practitioner (Nurse Practitioner)  Extended Emergency Contact Information Primary Emergency Contact: [name] Address: [address]          [place], [place] [address] [place] Phone: [phone] Relation: Spouse Secondary Emergency Contact: [name], [place] Phone: [phone] Relation: Daughter  Code Status:  DNR Goals of care: Advanced Directive information Advanced Directives 10/08/2016  Does Patient Have a Medical Advance Directive? Yes  Type of Advance Directive Out of facility DNR (pink MOST or yellow form);[place];Living will  Does patient want to make changes to medical advance directive? -  Copy of [place] in Chart? Yes  Pre-existing out of facility DNR order (yellow form or pink MOST form) Yellow form placed in chart (order not valid for inpatient use);Pink MOST form placed in chart (order not valid for inpatient use)     Chief Complaint  Patient presents with  . Acute Visit    wheeze, temp 101.8    HPI:  Pt is a 83 y.o. male seen today for an acute visit for temp of 101.8 x 1 reading 10/31/2016 He has a rattling cough per staff with congestion.  Appears uncomfortable with a flushed face.  Sats 90-91% on RA RR 22 No sputum production, alert and verbal but not able to answer q's or follow commands which is his baseline.  Hx of left hydronephrosis not treated due to his age/debility/dementia Remains with foley cath in place due to urinary retention, no reports of purulent or bloody urine Hx of diastolic CHF, daily wts show him at 213-214  lbs the past two weeks but overall steady gain over the past month. He received lasix 20 mg qd x 3 days 4/19-4/22 due to weight gain and his weigh went down to 211.  He has had issues with hypernatremia due to decreased intake and so he is not on daily doses of lasix.  He has chronic edema in his legs and wears compression wraps bilaterally.     Past Medical History:  Diagnosis Date  . Allergic rhinitis   . Alzheimer's disease 2013  . Atrial fibrillation ([place])    Long term anti coagulation w/ warfarin fro stroke risk reduction  . BPH (benign prostatic hyperplasia)   . Chronic venous insufficiency 01/12/2013  . Dementia with behavioral disturbance 12/29/13  . Gait disorder   . GERD (gastroesophageal reflux disease)   . History of CVA (cerebrovascular accident) 2001   Rt.MCA  . Hyperlipidemia   . Lactose intolerance   . Long term (current) use of anticoagulants 11/23/2012   Long-term anticoagulation with warfarin for stroke risk reduction related to AF   Past Surgical History:  Procedure Laterality Date  . CATARACT EXTRACTION W/ INTRAOCULAR LENS  IMPLANT, BILATERAL    . HERNIA REPAIR Right 1970s  . KNEE SURGERY Right 2000  . MASS EXCISION Left 07/30/2013   Procedure: EXCISION OF BASAL CELL CARCINOMA  LEFT FOREHEAD ;  Surgeon: Irene Limbo, MD;  Location: [facility];  Service: Plastics;  Laterality: Left;  . RETINAL DETACHMENT SURGERY Right 2009    Allergies  Allergen Reactions  . Penicillins Rash  . Bee Venom   . Risperidone And Related     Unstable gait    Outpatient Encounter Prescriptions as of 10/31/2016  Medication Sig  . apixaban (ELIQUIS) 2.5 MG TABS tablet Take 2.5 mg by mouth 2 (two) times daily.  . ciclopirox (LOPROX) 0.77 % cream Apply topically 2 (two) times daily as needed (redness).  Marland Kitchen ipratropium-albuterol (DUONEB) 0.5-2.5 (3) MG/3ML SOLN Take 3 mLs by nebulization every 6 (six) hours as needed (for wheezing and cough).   . lactose free nutrition  (BOOST) LIQD Take 237 mLs by mouth 2 (two) times daily between meals.  . nitroGLYCERIN (NITROSTAT) 0.4 MG SL tablet Place 0.4 mg under the tongue every 5 (five) minutes as needed for chest pain.  . polyethylene glycol (MIRALAX / GLYCOLAX) packet Take 17 g by mouth daily.  . QUEtiapine (SEROQUEL) 25 MG tablet Take 25 mg by mouth daily with breakfast.  . tamsulosin (FLOMAX) 0.4 MG CAPS capsule Take 0.4 mg by mouth at bedtime. Hold for SBP < 95   No facility-administered encounter medications on file as of 10/31/2016.     Review of Systems  Unable to perform ROS: Dementia    Immunization History  Administered Date(s) Administered  . Influenza Inj Mdck Quad Pf 04/25/2016  . Influenza Whole 04/20/2013  . Influenza-Unspecified 04/12/2014, 04/20/2015  . PPD Test 11/29/2012  . Pneumococcal Conjugate-13 08/01/2016   Pertinent  Health Maintenance Due  Topic Date Due  . INFLUENZA VACCINE  02/05/2017  . PNA vac Low Risk Adult (2 of 2 - PPSV23) 08/01/2017   Fall Risk  06/21/2016  Falls in the past year? No   Functional Status Survey:    Vitals:   10/31/16 1619  BP: (!) 145/80  Pulse: (!) 108  Resp: 20  Temp: (!) 101.8 F (38.8 C)  SpO2: 91%  Weight: 213 lb 9.6 oz (96.9 kg)   Body mass index is 28.97 kg/m. Physical Exam  Constitutional: No distress.  HENT:  Head: Normocephalic and atraumatic.  Would not open his mouth for mouth exam, clear drainage to both nares  Eyes: Conjunctivae are normal. Pupils are equal, round, and reactive to light. Right eye exhibits no discharge. Left eye exhibits no discharge.  Neck: No JVD present. No tracheal deviation present. No thyromegaly present.  Cardiovascular: Normal rate.   No murmur heard. irregular  Pulmonary/Chest: Effort normal. No respiratory distress. He has wheezes.  Slight increased work of breathing  Abdominal: Soft. Bowel sounds are normal. He exhibits no distension. There is no tenderness.  Lymphadenopathy:    He has no  cervical adenopathy.  Neurological: He is alert.  Mumbles words, not able to f/c  Skin: Skin is warm and dry. He is not diaphoretic.  Flushed face  Psychiatric:  Slightly agitated  Nursing note and vitals reviewed.   Labs reviewed:  Recent Labs  04/19/16 0309  04/27/16 0110 04/27/16 0128 04/27/16 1020 04/28/16 0226  10/10/16 10/19/16 10/31/16  NA 142  < > 141 145  --  142  < > 144 148* 144  K 3.6  < > 3.6 3.9  --  2.9*  < > 3.5 3.4 2.7*  CL 111  --  115* 115*  --  111  --   --   --   --   CO2 23  --  20*  --   --  24  --   --   --   --   GLUCOSE 114*  --  122* 112*  --  105*  --   --   --   --   BUN 25*  < > 22* 31*  --  19  < > 26* 28* 24*  CREATININE 1.18  < > 1.11 1.10  --  1.28*  < > 1.1 1.1 1.1  CALCIUM 8.4*  --  8.3*  --   --  8.4*  --   --   --   --   MG  --   --   --   --  2.0  --   --   --   --   --   < > = values in this interval not displayed.  Recent Labs  04/19/16 0309 04/27/16 0110 04/28/16 0226  AST 17 14* 17  ALT 13* 10* 10*  ALKPHOS 75 78 81  BILITOT 0.3 0.7 1.2  PROT 6.7 6.3* 6.3*  ALBUMIN 2.7* 2.6* 2.6*    Recent Labs  02/20/16 2355  04/19/16 0309 04/27/16 0110  04/28/16 0226 07/06/16 09/30/16 0630  WBC 10.1  < > 8.6 9.7  --  8.6 7.6 6.7  NEUTROABS 7.3  --  5.5 6.8  --   --   --   --   HGB 13.0  < > 12.4* 11.8*  < > 12.0* 12.9* 12.4*  HCT 40.5  < > 38.5* 36.8*  < > 37.2* 39* 38*  MCV 94.6  --  92.3 92.7  --  91.4  --   --   PLT 214  < > 313 332  --  346 206 203  < > = values in this interval not displayed. Lab Results  Component Value Date   TSH 5.05 08/08/2016   No results found for: HGBA1C Lab Results  Component Value Date   CHOL 155 08/31/2015   HDL 33 (A) 08/31/2015   LDLCALC 99 08/31/2015   TRIG 116 08/31/2015    Significant Diagnostic Results in last 30 days:  No results found.  Assessment/Plan  1) Cough Noted temp 101.8 with sick contact Check 2 view CXR Duonebs q 6 prn cough/wheeze  2) Diastolic CHF Weight up  over time to 213 lbs, stable over past two weeks though Most likely his wheeze and congestion is due to acute infection but will give 1 dose of Lasix 40 mg IM due to increase RR And weight gain, will order depending upon response and CXR results Monitor VS and sats q shift  3) Hypokalemia 40 meq Kdur BID for 2 days Repeat BMP Monday 4/30  Family/ staff Communication: discussed with nsg supervisor  Labs/tests ordered:  BMP CXR

## 2016-11-04 ENCOUNTER — Non-Acute Institutional Stay: Payer: Medicare Other | Admitting: Adult Health

## 2016-11-04 ENCOUNTER — Encounter: Payer: Self-pay | Admitting: Adult Health

## 2016-11-04 DIAGNOSIS — D649 Anemia, unspecified: Secondary | ICD-10-CM | POA: Diagnosis not present

## 2016-11-04 DIAGNOSIS — E876 Hypokalemia: Secondary | ICD-10-CM | POA: Diagnosis not present

## 2016-11-04 DIAGNOSIS — J181 Lobar pneumonia, unspecified organism: Secondary | ICD-10-CM

## 2016-11-04 DIAGNOSIS — E870 Hyperosmolality and hypernatremia: Secondary | ICD-10-CM | POA: Diagnosis not present

## 2016-11-04 LAB — BASIC METABOLIC PANEL
BUN: 32 mg/dL — AB (ref 4–21)
Creatinine: 1.1 mg/dL (ref 0.6–1.3)
Glucose: 115 mg/dL
Potassium: 3 mmol/L — AB (ref 3.4–5.3)
Sodium: 150 mmol/L — AB (ref 137–147)

## 2016-11-04 NOTE — Progress Notes (Signed)
Location:  Occupational psychologist of Service:  ALF (13) Provider:   Cindi Carbon, ANP [place] [phone]   Bryan [name], [name], DO  Patient Care Team: [name], DO as PCP - General (Geriatric Medicine) Well [place], NP as Nurse Practitioner (Nurse Practitioner)  Extended Emergency Contact Information Primary Emergency Contact: Bryan [name] Address: [address]          [place], [name] [address] [place] Phone: [phone] Relation: Spouse Secondary Emergency Contact: Bryan [name], [place] Phone: [phone] Relation: Daughter  Code Status:  DNR Goals of care: Advanced Directive information Advanced Directives 10/08/2016  Does Patient Have a Medical Advance Directive? Yes  Type of Advance Directive Out of facility DNR (pink MOST or yellow form);[place];Living will  Does patient want to make changes to medical advance directive? -  Copy of [place] in Chart? Yes  Pre-existing out of facility DNR order (yellow form or pink MOST form) Yellow form placed in chart (order not valid for inpatient use);Pink MOST form placed in chart (order not valid for inpatient use)     Chief Complaint  Patient presents with  . Acute Visit    elevated sodium    HPI:  Pt is a 83 y.o. male seen today for an acute visit for elevated sodium of 150 and weight loss. He as seen on 4/26 due to temp of 101.8, cough, congestion, and mild resp distress.  10/31/16: CXR possible RLL infiltrate, started on Levaquin Now temp 97, no further resp distress.  Has 1 dose of lasix 40 mg due to weight due to mild resp distress and weight gain  Significant wheezing on 4/28 and was given 1 dose of Depomedrol.  Staff report decreased appetite and weight loss Wt Readings from Last 3 Encounters:  11/04/16 207 lb (93.9 kg)    10/31/16 213 lb 9.6 oz (96.9 kg)  10/14/16 210 lb 9.6 oz (95.5 kg)   BUN rising to 32, CR 1.1 up from baseline 22/1.1  Has significant left hydronephrosis which is not being treated per family wishes due to age, advanced dementia, and goals of care.    Past Medical History:  Diagnosis Date  . Allergic rhinitis   . Alzheimer's disease 2013  . Atrial fibrillation ([place])    Long term anti coagulation w/ warfarin fro stroke risk reduction  . BPH (benign prostatic hyperplasia)   . Chronic venous insufficiency 01/12/2013  . Dementia with behavioral disturbance 12/29/13  . Gait disorder   . GERD (gastroesophageal reflux disease)   . History of CVA (cerebrovascular accident) 2001   Rt.MCA  . Hyperlipidemia   . Lactose intolerance   . Long term (current) use of anticoagulants 11/23/2012   Long-term anticoagulation with warfarin for stroke risk reduction related to AF   Past Surgical History:  Procedure Laterality Date  . CATARACT EXTRACTION W/ INTRAOCULAR LENS  IMPLANT, BILATERAL    . HERNIA REPAIR Right 1970s  . KNEE SURGERY Right 2000  . MASS EXCISION Left 07/30/2013   Procedure: EXCISION OF BASAL CELL CARCINOMA  LEFT FOREHEAD ;  Surgeon: Irene Limbo, MD;  Location: [facility];  Service: Plastics;  Laterality: Left;  . RETINAL DETACHMENT SURGERY Right 2009    Allergies  Allergen Reactions  . Penicillins Rash  . Bee Venom   . Risperidone And Related  Unstable gait    Outpatient Encounter Prescriptions as of 11/04/2016  Medication Sig  . apixaban (ELIQUIS) 2.5 MG TABS tablet Take 2.5 mg by mouth 2 (two) times daily.  . ciclopirox (LOPROX) 0.77 % cream Apply topically 2 (two) times daily as needed (redness).  Marland Kitchen ipratropium-albuterol (DUONEB) 0.5-2.5 (3) MG/3ML SOLN Take 3 mLs by nebulization every 6 (six) hours as needed (for wheezing and cough).   . lactose free nutrition (BOOST) LIQD Take 237 mLs by mouth 2 (two) times daily between meals.  . nitroGLYCERIN  (NITROSTAT) 0.4 MG SL tablet Place 0.4 mg under the tongue every 5 (five) minutes as needed for chest pain.  . polyethylene glycol (MIRALAX / GLYCOLAX) packet Take 17 g by mouth daily.  . QUEtiapine (SEROQUEL) 25 MG tablet Take 25 mg by mouth daily with breakfast.  . tamsulosin (FLOMAX) 0.4 MG CAPS capsule Take 0.4 mg by mouth at bedtime. Hold for SBP < 95   No facility-administered encounter medications on file as of 11/04/2016.     Review of Systems  Unable to perform ROS: Dementia    Immunization History  Administered Date(s) Administered  . Influenza Inj Mdck Quad Pf 04/25/2016  . Influenza Whole 04/20/2013  . Influenza-Unspecified 04/12/2014, 04/20/2015  . PPD Test 11/29/2012  . Pneumococcal Conjugate-13 08/01/2016   Pertinent  Health Maintenance Due  Topic Date Due  . INFLUENZA VACCINE  02/05/2017  . PNA vac Low Risk Adult (2 of 2 - PPSV23) 08/01/2017   Fall Risk  06/21/2016  Falls in the past year? No   Functional Status Survey:    Vitals:   11/04/16 1451  BP: 113/83  Pulse: 90  Resp: 20  Temp: 97.8 F (36.6 C)  SpO2: 97%  Weight: 207 lb (93.9 kg)   Body mass index is 28.07 kg/m. Physical Exam  Constitutional: No distress.  HENT:  Head: Normocephalic and atraumatic.  Would not open his mouth for mouth exam, clear drainage to both nares  Eyes: Conjunctivae are normal. Pupils are equal, round, and reactive to light. Right eye exhibits no discharge. Left eye exhibits no discharge.  Neck: No JVD present. No tracheal deviation present. No thyromegaly present.  Cardiovascular: Normal rate.   No murmur heard. irregular  Pulmonary/Chest: Effort normal. No respiratory distress. Lung clear to auscultation bilaterally. Abdominal: Soft. Bowel sounds are normal. He exhibits no distension. There is no tenderness.  Lymphadenopathy:    He has no cervical adenopathy.  Neurological: He is alert.  Mumbles words, not able to f/c  Skin: Skin is warm and dry. He is not  diaphoretic.  Psychiatric:  Slightly agitated  Nursing note and vitals reviewed.   Labs reviewed:  Recent Labs  04/19/16 0309  04/27/16 0110 04/27/16 0128 04/27/16 1020 04/28/16 0226  10/19/16 10/31/16 11/04/16  NA 142  < > 141 145  --  142  < > 148* 144 150*  K 3.6  < > 3.6 3.9  --  2.9*  < > 3.4 2.7* 3.0*  CL 111  --  115* 115*  --  111  --   --   --   --   CO2 23  --  20*  --   --  24  --   --   --   --   GLUCOSE 114*  --  122* 112*  --  105*  --   --   --   --   BUN 25*  < > 22* 31*  --  19  < >  28* 24* 32*  CREATININE 1.18  < > 1.11 1.10  --  1.28*  < > 1.1 1.1 1.1  CALCIUM 8.4*  --  8.3*  --   --  8.4*  --   --   --   --   MG  --   --   --   --  2.0  --   --   --   --   --   < > = values in this interval not displayed.  Recent Labs  04/19/16 0309 04/27/16 0110 04/28/16 0226  AST 17 14* 17  ALT 13* 10* 10*  ALKPHOS 75 78 81  BILITOT 0.3 0.7 1.2  PROT 6.7 6.3* 6.3*  ALBUMIN 2.7* 2.6* 2.6*    Recent Labs  02/20/16 2355  04/19/16 0309 04/27/16 0110  04/28/16 0226 07/06/16 09/30/16 0630  WBC 10.1  < > 8.6 9.7  --  8.6 7.6 6.7  NEUTROABS 7.3  --  5.5 6.8  --   --   --   --   HGB 13.0  < > 12.4* 11.8*  < > 12.0* 12.9* 12.4*  HCT 40.5  < > 38.5* 36.8*  < > 37.2* 39* 38*  MCV 94.6  --  92.3 92.7  --  91.4  --   --   PLT 214  < > 313 332  --  346 206 203  < > = values in this interval not displayed. Lab Results  Component Value Date   TSH 5.05 08/08/2016   No results found for: HGBA1C Lab Results  Component Value Date   CHOL 155 08/31/2015   HDL 33 (A) 08/31/2015   LDLCALC 99 08/31/2015   TRIG 116 08/31/2015    Significant Diagnostic Results in last 30 days:  No results found.  Assessment/Plan  1. Lobar pneumonia (HCC) Improved Continue Levaquin 500 mg to complete 7 doses Continue duonebs TID  2. Hypernatremia Decreased intake led to this issue Will ask the staff to encourage fluids q2 hrs while awake and recheck Wed 5/2 He is alert and in  his usual state of health otherwise, would like to avoid an IV given his propensity for overload I have asked the staff to report if he is not able to increase his fluid amt or loses further weight  Family/ staff Communication: discussed with nsg supervisor  Labs/tests ordered:  BMP 5/2

## 2016-11-05 DIAGNOSIS — M6389 Disorders of muscle in diseases classified elsewhere, multiple sites: Secondary | ICD-10-CM | POA: Diagnosis not present

## 2016-11-05 DIAGNOSIS — L89619 Pressure ulcer of right heel, unspecified stage: Secondary | ICD-10-CM | POA: Diagnosis not present

## 2016-11-05 DIAGNOSIS — R278 Other lack of coordination: Secondary | ICD-10-CM | POA: Diagnosis not present

## 2016-11-05 DIAGNOSIS — R609 Edema, unspecified: Secondary | ICD-10-CM | POA: Diagnosis not present

## 2016-11-05 DIAGNOSIS — M24561 Contracture, right knee: Secondary | ICD-10-CM | POA: Diagnosis not present

## 2016-11-05 DIAGNOSIS — R293 Abnormal posture: Secondary | ICD-10-CM | POA: Diagnosis not present

## 2016-11-06 DIAGNOSIS — D649 Anemia, unspecified: Secondary | ICD-10-CM | POA: Diagnosis not present

## 2016-11-06 DIAGNOSIS — E870 Hyperosmolality and hypernatremia: Secondary | ICD-10-CM | POA: Diagnosis not present

## 2016-11-07 DIAGNOSIS — R609 Edema, unspecified: Secondary | ICD-10-CM | POA: Diagnosis not present

## 2016-11-07 DIAGNOSIS — M6389 Disorders of muscle in diseases classified elsewhere, multiple sites: Secondary | ICD-10-CM | POA: Diagnosis not present

## 2016-11-07 DIAGNOSIS — M24561 Contracture, right knee: Secondary | ICD-10-CM | POA: Diagnosis not present

## 2016-11-07 DIAGNOSIS — L89619 Pressure ulcer of right heel, unspecified stage: Secondary | ICD-10-CM | POA: Diagnosis not present

## 2016-11-07 DIAGNOSIS — R293 Abnormal posture: Secondary | ICD-10-CM | POA: Diagnosis not present

## 2016-11-07 DIAGNOSIS — R278 Other lack of coordination: Secondary | ICD-10-CM | POA: Diagnosis not present

## 2016-11-08 DIAGNOSIS — E860 Dehydration: Secondary | ICD-10-CM | POA: Diagnosis not present

## 2016-11-08 DIAGNOSIS — D649 Anemia, unspecified: Secondary | ICD-10-CM | POA: Diagnosis not present

## 2016-11-11 DIAGNOSIS — M6389 Disorders of muscle in diseases classified elsewhere, multiple sites: Secondary | ICD-10-CM | POA: Diagnosis not present

## 2016-11-11 DIAGNOSIS — M24561 Contracture, right knee: Secondary | ICD-10-CM | POA: Diagnosis not present

## 2016-11-11 DIAGNOSIS — D649 Anemia, unspecified: Secondary | ICD-10-CM | POA: Diagnosis not present

## 2016-11-11 DIAGNOSIS — R609 Edema, unspecified: Secondary | ICD-10-CM | POA: Diagnosis not present

## 2016-11-11 DIAGNOSIS — R293 Abnormal posture: Secondary | ICD-10-CM | POA: Diagnosis not present

## 2016-11-11 DIAGNOSIS — E860 Dehydration: Secondary | ICD-10-CM | POA: Diagnosis not present

## 2016-11-11 DIAGNOSIS — R278 Other lack of coordination: Secondary | ICD-10-CM | POA: Diagnosis not present

## 2016-11-11 DIAGNOSIS — L89619 Pressure ulcer of right heel, unspecified stage: Secondary | ICD-10-CM | POA: Diagnosis not present

## 2016-11-14 DIAGNOSIS — R609 Edema, unspecified: Secondary | ICD-10-CM | POA: Diagnosis not present

## 2016-11-14 DIAGNOSIS — M24561 Contracture, right knee: Secondary | ICD-10-CM | POA: Diagnosis not present

## 2016-11-14 DIAGNOSIS — R278 Other lack of coordination: Secondary | ICD-10-CM | POA: Diagnosis not present

## 2016-11-14 DIAGNOSIS — M6389 Disorders of muscle in diseases classified elsewhere, multiple sites: Secondary | ICD-10-CM | POA: Diagnosis not present

## 2016-11-14 DIAGNOSIS — D649 Anemia, unspecified: Secondary | ICD-10-CM | POA: Diagnosis not present

## 2016-11-14 DIAGNOSIS — R293 Abnormal posture: Secondary | ICD-10-CM | POA: Diagnosis not present

## 2016-11-14 DIAGNOSIS — I5032 Chronic diastolic (congestive) heart failure: Secondary | ICD-10-CM | POA: Diagnosis not present

## 2016-11-14 DIAGNOSIS — L89619 Pressure ulcer of right heel, unspecified stage: Secondary | ICD-10-CM | POA: Diagnosis not present

## 2016-11-14 DIAGNOSIS — R339 Retention of urine, unspecified: Secondary | ICD-10-CM | POA: Diagnosis not present

## 2016-11-20 DIAGNOSIS — L89619 Pressure ulcer of right heel, unspecified stage: Secondary | ICD-10-CM | POA: Diagnosis not present

## 2016-11-20 DIAGNOSIS — R609 Edema, unspecified: Secondary | ICD-10-CM | POA: Diagnosis not present

## 2016-11-20 DIAGNOSIS — M6389 Disorders of muscle in diseases classified elsewhere, multiple sites: Secondary | ICD-10-CM | POA: Diagnosis not present

## 2016-11-20 DIAGNOSIS — R293 Abnormal posture: Secondary | ICD-10-CM | POA: Diagnosis not present

## 2016-11-20 DIAGNOSIS — M24561 Contracture, right knee: Secondary | ICD-10-CM | POA: Diagnosis not present

## 2016-11-20 DIAGNOSIS — R278 Other lack of coordination: Secondary | ICD-10-CM | POA: Diagnosis not present

## 2016-11-21 DIAGNOSIS — D649 Anemia, unspecified: Secondary | ICD-10-CM | POA: Diagnosis not present

## 2016-11-21 DIAGNOSIS — I5032 Chronic diastolic (congestive) heart failure: Secondary | ICD-10-CM | POA: Diagnosis not present

## 2016-11-21 DIAGNOSIS — R339 Retention of urine, unspecified: Secondary | ICD-10-CM | POA: Diagnosis not present

## 2016-11-21 LAB — BASIC METABOLIC PANEL
BUN: 25 mg/dL — AB (ref 4–21)
CREATININE: 1 mg/dL (ref 0.6–1.3)
GLUCOSE: 93 mg/dL
POTASSIUM: 3.1 mmol/L — AB (ref 3.4–5.3)
SODIUM: 145 mmol/L (ref 137–147)

## 2016-11-22 ENCOUNTER — Non-Acute Institutional Stay: Payer: Medicare Other | Admitting: Adult Health

## 2016-11-22 DIAGNOSIS — N1330 Unspecified hydronephrosis: Secondary | ICD-10-CM | POA: Diagnosis not present

## 2016-11-22 DIAGNOSIS — K5901 Slow transit constipation: Secondary | ICD-10-CM

## 2016-11-22 DIAGNOSIS — F0281 Dementia in other diseases classified elsewhere with behavioral disturbance: Secondary | ICD-10-CM | POA: Diagnosis not present

## 2016-11-22 DIAGNOSIS — N402 Nodular prostate without lower urinary tract symptoms: Secondary | ICD-10-CM

## 2016-11-22 DIAGNOSIS — R609 Edema, unspecified: Secondary | ICD-10-CM | POA: Diagnosis not present

## 2016-11-22 DIAGNOSIS — M6389 Disorders of muscle in diseases classified elsewhere, multiple sites: Secondary | ICD-10-CM | POA: Diagnosis not present

## 2016-11-22 DIAGNOSIS — L89619 Pressure ulcer of right heel, unspecified stage: Secondary | ICD-10-CM | POA: Diagnosis not present

## 2016-11-22 DIAGNOSIS — R293 Abnormal posture: Secondary | ICD-10-CM | POA: Diagnosis not present

## 2016-11-22 DIAGNOSIS — R627 Adult failure to thrive: Secondary | ICD-10-CM | POA: Diagnosis not present

## 2016-11-22 DIAGNOSIS — F02818 Dementia in other diseases classified elsewhere, unspecified severity, with other behavioral disturbance: Secondary | ICD-10-CM

## 2016-11-22 DIAGNOSIS — R278 Other lack of coordination: Secondary | ICD-10-CM | POA: Diagnosis not present

## 2016-11-22 DIAGNOSIS — G301 Alzheimer's disease with late onset: Secondary | ICD-10-CM | POA: Diagnosis not present

## 2016-11-22 DIAGNOSIS — M24561 Contracture, right knee: Secondary | ICD-10-CM | POA: Diagnosis not present

## 2016-11-25 ENCOUNTER — Non-Acute Institutional Stay: Payer: Medicare Other | Admitting: Adult Health

## 2016-11-25 ENCOUNTER — Encounter: Payer: Self-pay | Admitting: Adult Health

## 2016-11-25 DIAGNOSIS — N1330 Unspecified hydronephrosis: Secondary | ICD-10-CM | POA: Insufficient documentation

## 2016-11-25 DIAGNOSIS — R062 Wheezing: Secondary | ICD-10-CM | POA: Diagnosis not present

## 2016-11-25 DIAGNOSIS — R140 Abdominal distension (gaseous): Secondary | ICD-10-CM

## 2016-11-25 DIAGNOSIS — R109 Unspecified abdominal pain: Secondary | ICD-10-CM | POA: Diagnosis not present

## 2016-11-25 DIAGNOSIS — N402 Nodular prostate without lower urinary tract symptoms: Secondary | ICD-10-CM | POA: Insufficient documentation

## 2016-11-25 DIAGNOSIS — R0602 Shortness of breath: Secondary | ICD-10-CM | POA: Diagnosis not present

## 2016-11-25 NOTE — Progress Notes (Addendum)
Location:  Occupational psychologist of Service:  ALF (13) Provider:   Cindi Carbon, ANP [place] [phone]   [name], DO  Patient Care Team: [name], DO as PCP - General (Geriatric Medicine) Community, [facility] Angelique Holm, Margreta Journey, NP as Nurse Practitioner (Nurse Practitioner)  Extended Emergency Contact Information Primary Emergency Contact: [name] Address: [address]          [place], [place] [address] [place] Phone: [phone] Relation: Spouse Secondary Emergency Contact: [name], [place] Phone: [phone] Relation: Daughter  Code Status:  DNR Goals of care: Advanced Directive information Advanced Directives 11/25/2016  Does Patient Have a Medical Advance Directive? Yes  Type of Advance Directive Out of facility DNR (pink MOST or yellow form);[place];Living will  Does patient want to make changes to medical advance directive? -  Copy of [place] in Chart? Yes  Pre-existing out of facility DNR order (yellow form or pink MOST form) Yellow form placed in chart (order not valid for inpatient use);Pink MOST form placed in chart (order not valid for inpatient use)     Chief Complaint  Patient presents with  . Acute Visit    SOB, poor appetite    HPI:  Pt is a 83 y.o. male seen today for an acute visit for abd distention and increased WOB.   He has dementia and can not contribute to the hx. He has had advancing weakness and poor appetite for the past year with intermittent issues with UTI and aspiration pna.  His family has been offered hospice but they have declined. He is a DNR and they would like to avoid hospitalizations if possible. He has issues with difficulty passing gas and it was noted in my visit that his abd was distended. He has had a BM every day and takes miralax.  He is currently on Boost BID but the staff are having a harder time getting him to swallow. He was seen on 5/18 for abd distention and was placed on his left side in bed, passed gas and felt better.  Wt Readings from Last 3 Encounters:  11/25/16 206 lb 14.4 oz (93.8 kg)  11/04/16 207 lb (93.9 kg)  10/31/16 213 lb 9.6 oz (96.9 kg)    He was treated for RLL pna with levaquin on 4/26.   He has a hx of urinary retention and has a chronic indwelling foley. Also has left hydronephrosis noted on 3/23.  No additional work up has been complete due to his debility, age, and dementia. Also has a hx of prostate nodule.  He is off seroquel as of last week due to increased sleepiness.  Update 11/25/2016 Poor appetite this am but did take boost. They had difficulty getting him to swallow his meds. He is alert but continues with abd distention without fever or nausea. He seems to wheeze unless he is upright.  Sats remain WNL. No cough, fever, or sputum production  11/25/16: CXR revealed bibasilar infiltrates which may represent multifocal infectious process ABD xray revealed dilated bowel 13 cm (indeterminate if it is the small or large bowel), normal gas and stool pattern otherwise, no obvious obstruction  Past Medical History:  Diagnosis Date  . Allergic rhinitis   . Alzheimer's disease 2013  . Atrial fibrillation ([place])    Long term anti coagulation w/  warfarin fro stroke risk reduction  . BPH (benign prostatic hyperplasia)   . Chronic venous insufficiency 01/12/2013  . Dementia with behavioral disturbance 12/29/13  . Gait disorder   . GERD (gastroesophageal reflux disease)   . History of CVA (cerebrovascular accident) 2001   Rt.MCA  . Hyperlipidemia   . Lactose intolerance   . Long term (current) use of anticoagulants 11/23/2012   Long-term anticoagulation with warfarin for stroke risk reduction related to AF   Past Surgical History:  Procedure Laterality Date  . CATARACT EXTRACTION W/ INTRAOCULAR  LENS  IMPLANT, BILATERAL    . HERNIA REPAIR Right 1970s  . KNEE SURGERY Right 2000  . MASS EXCISION Left 07/30/2013   Procedure: EXCISION OF BASAL CELL CARCINOMA  LEFT FOREHEAD ;  Surgeon: Irene Limbo, MD;  Location: [facility];  Service: Plastics;  Laterality: Left;  . RETINAL DETACHMENT SURGERY Right 2009    Allergies  Allergen Reactions  . Penicillins Rash  . Bee Venom   . Risperidone And Related     Unstable gait    Outpatient Encounter Prescriptions as of 11/25/2016  Medication Sig  . potassium chloride SA (K-DUR,KLOR-CON) 20 MEQ tablet Take 40 mEq by mouth daily.  Marland Kitchen apixaban (ELIQUIS) 2.5 MG TABS tablet Take 2.5 mg by mouth 2 (two) times daily.  . ciclopirox (LOPROX) 0.77 % cream Apply topically 2 (two) times daily as needed (redness).  Marland Kitchen ipratropium-albuterol (DUONEB) 0.5-2.5 (3) MG/3ML SOLN Take 3 mLs by nebulization 3 (three) times daily. q 6 hrs prn  . lactose free nutrition (BOOST) LIQD Take 237 mLs by mouth 2 (two) times daily between meals.  . nitroGLYCERIN (NITROSTAT) 0.4 MG SL tablet Place 0.4 mg under the tongue every 5 (five) minutes as needed for chest pain.  . polyethylene glycol (MIRALAX / GLYCOLAX) packet Take 17 g by mouth daily.  . tamsulosin (FLOMAX) 0.4 MG CAPS capsule Take 0.4 mg by mouth at bedtime. Hold for SBP < 95  . [DISCONTINUED] QUEtiapine (SEROQUEL) 25 MG tablet Take 25 mg by mouth daily with breakfast.   No facility-administered encounter medications on file as of 11/25/2016.     Review of Systems  Unable to perform ROS: Dementia    Immunization History  Administered Date(s) Administered  . Influenza Inj Mdck Quad Pf 04/25/2016  . Influenza Whole 04/20/2013  . Influenza-Unspecified 04/12/2014, 04/20/2015  . PPD Test 11/29/2012  . Pneumococcal Conjugate-13 08/01/2016   Pertinent  Health Maintenance Due  Topic Date Due  . INFLUENZA VACCINE  02/05/2017  . PNA vac Low Risk Adult (2 of 2 - PPSV23) 08/01/2017   Fall Risk   06/21/2016  Falls in the past year? No   Functional Status Survey:    Vitals:   11/25/16 1026  BP: 135/90  Pulse: 79  Resp: (!) 22  Temp: 98.1 F (36.7 C)  SpO2: 94%  Weight: 206 lb 14.4 oz (93.8 kg)   Body mass index is 28.06 kg/m. Physical Exam  Constitutional: No distress.  HENT:  Head: Normocephalic and atraumatic.  Neck: Normal range of motion. Neck supple. No JVD present. No tracheal deviation present. No thyromegaly present.  Cardiovascular: Normal rate and regular rhythm.   No murmur heard. BLE edema +1 R>L  Pulmonary/Chest: Effort normal. No respiratory distress. He has wheezes (upper airway).  Abdominal: Soft. He exhibits distension. There is no tenderness.  Hypoactive bowel sounds but present in all four quads, slight firm  Lymphadenopathy:    He has no cervical adenopathy.  Neurological:  He is alert. No cranial nerve deficit.  Skin: Skin is warm and dry. He is not diaphoretic.  Psychiatric: He has a normal mood and affect.  Nursing note and vitals reviewed.   Labs reviewed:  Recent Labs  04/19/16 0309  04/27/16 0110 04/27/16 0128 04/27/16 1020 04/28/16 0226  10/31/16 11/04/16 11/21/16  NA 142  < > 141 145  --  142  < > 144 150* 145  K 3.6  < > 3.6 3.9  --  2.9*  < > 2.7* 3.0* 3.1*  CL 111  --  115* 115*  --  111  --   --   --   --   CO2 23  --  20*  --   --  24  --   --   --   --   GLUCOSE 114*  --  122* 112*  --  105*  --   --   --   --   BUN 25*  < > 22* 31*  --  19  < > 24* 32* 25*  CREATININE 1.18  < > 1.11 1.10  --  1.28*  < > 1.1 1.1 1.0  CALCIUM 8.4*  --  8.3*  --   --  8.4*  --   --   --   --   MG  --   --   --   --  2.0  --   --   --   --   --   < > = values in this interval not displayed.  Recent Labs  04/19/16 0309 04/27/16 0110 04/28/16 0226  AST 17 14* 17  ALT 13* 10* 10*  ALKPHOS 75 78 81  BILITOT 0.3 0.7 1.2  PROT 6.7 6.3* 6.3*  ALBUMIN 2.7* 2.6* 2.6*    Recent Labs  02/20/16 2355  04/19/16 0309 04/27/16 0110   04/28/16 0226 07/06/16 09/30/16 0630  WBC 10.1  < > 8.6 9.7  --  8.6 7.6 6.7  NEUTROABS 7.3  --  5.5 6.8  --   --   --   --   HGB 13.0  < > 12.4* 11.8*  < > 12.0* 12.9* 12.4*  HCT 40.5  < > 38.5* 36.8*  < > 37.2* 39* 38*  MCV 94.6  --  92.3 92.7  --  91.4  --   --   PLT 214  < > 313 332  --  346 206 203  < > = values in this interval not displayed. Lab Results  Component Value Date   TSH 5.05 08/08/2016   No results found for: HGBA1C Lab Results  Component Value Date   CHOL 155 08/31/2015   HDL 33 (A) 08/31/2015   LDLCALC 99 08/31/2015   TRIG 116 08/31/2015    Significant Diagnostic Results in last 30 days:  No results found.  Assessment/Plan  1)Abd distention Xray revealed dilated bowel (indeterminate if this is the small or large bowel) but no obstruction Will give fleets enema x 1 and place rectal tube Re xray on Wed 5/23  2) Wheezing  -most likely due to abd distention, also has a hx of dysphagia with recurrent pna -CXR showed bibasilar infiltrates which may be due to his abd process.  He has no cough, fever, temp, etc. I have discussed this with his son and we decided to hold off on antibiotics and re xray on Wed. -nebs have not helped -02 2L for comfort -Check CBC and BMP in am  Family/ staff Communication: discussed with staff and his son, [name]

## 2016-11-25 NOTE — Progress Notes (Signed)
Location:  Occupational psychologist of Service:  ALF (13) Provider:   Cindi Carbon, ANP [place] [phone]   [name], DO  Patient Care Team: [name], DO as PCP - General (Geriatric Medicine) Community, Well Angelique Holm, Margreta Journey, NP as Nurse Practitioner (Nurse Practitioner)  Extended Emergency Contact Information Primary Emergency Contact: [name] Address: [address]          [place], [place] [address] [place] Phone: [phone] Relation: Spouse Secondary Emergency Contact: [name], [place] Phone: [phone] Relation: Daughter  Code Status:  DNR Goals of care: Advanced Directive information Advanced Directives 11/25/2016  Does Patient Have a Medical Advance Directive? Yes  Type of Advance Directive Out of facility DNR (pink MOST or yellow form);[place];Living will  Does patient want to make changes to medical advance directive? -  Copy of [place] in Chart? Yes  Pre-existing out of facility DNR order (yellow form or pink MOST form) Yellow form placed in chart (order not valid for inpatient use);Pink MOST form placed in chart (order not valid for inpatient use)     Chief Complaint  Patient presents with  . Acute Visit    grimacing, increased WOB    HPI:  Pt is a 83 y.o. male seen today for an acute visit for grimacing and increased WOB.  He has dementia and can not contribute to the hx. He has had advancing weakness and poor appetite for the past year with intermittent issues with UTI and aspiration pna.  His family has been offered hospice but they have declined. He is a DNR and they would like to avoid hospitalizations if possible. He has issues with difficulty passing gas and it was noted in my visit that his abd was distended. He has had a BM every day and takes miralax.  His appetite was poor this am and has been so off and on for several months. He is currently on Boost BID but the staff are having a harder time getting him to swallow. Wt Readings from Last 3 Encounters:  11/25/16 206 lb 14.4 oz (93.8 kg)  11/04/16 207 lb (93.9 kg)  10/31/16 213 lb 9.6 oz (96.9 kg)   He does not have fever/cough/sputum production and sats are 90-94%. He was treated for RLL pna with levaquin on 4/26.   He has a hx of urinary retention and has a chronic indwelling foley. Also has left hydronephrosis noted on 3/23.  No additional work up has been complete due to his debility, age, and dementia. Also has a hx of prostate nodule.  He is off seroquel as of last week due to increased sleepiness.   Past Medical History:  Diagnosis Date  . Allergic rhinitis   . Alzheimer's disease 2013  . Atrial fibrillation ([place])    Long term anti coagulation w/ warfarin fro stroke risk reduction  . BPH (benign prostatic hyperplasia)   . Chronic venous insufficiency 01/12/2013  . Dementia with behavioral disturbance 12/29/13  . Gait disorder   . GERD (gastroesophageal reflux disease)   . History of CVA (cerebrovascular accident) 2001   Rt.MCA  . Hyperlipidemia   . Lactose intolerance   . Long term (current) use of anticoagulants 11/23/2012   Long-term anticoagulation with warfarin for stroke risk reduction related to AF   Past Surgical History:  Procedure  Laterality Date  . CATARACT EXTRACTION W/ INTRAOCULAR LENS  IMPLANT, BILATERAL    . HERNIA REPAIR Right 1970s  . KNEE SURGERY Right 2000  . MASS EXCISION Left 07/30/2013   Procedure: EXCISION OF BASAL CELL CARCINOMA  LEFT FOREHEAD ;  Surgeon: Irene Limbo, MD;  Location: [facility];  Service: Plastics;  Laterality: Left;  . RETINAL DETACHMENT SURGERY Right 2009    Allergies  Allergen Reactions  . Penicillins Rash  . Bee Venom   . Risperidone And Related     Unstable gait    Outpatient Encounter Prescriptions as  of 11/22/2016  Medication Sig  . apixaban (ELIQUIS) 2.5 MG TABS tablet Take 2.5 mg by mouth 2 (two) times daily.  . ciclopirox (LOPROX) 0.77 % cream Apply topically 2 (two) times daily as needed (redness).  Marland Kitchen ipratropium-albuterol (DUONEB) 0.5-2.5 (3) MG/3ML SOLN Take 3 mLs by nebulization 3 (three) times daily. q 6 hrs prn  . lactose free nutrition (BOOST) LIQD Take 237 mLs by mouth 2 (two) times daily between meals.  . nitroGLYCERIN (NITROSTAT) 0.4 MG SL tablet Place 0.4 mg under the tongue every 5 (five) minutes as needed for chest pain.  . polyethylene glycol (MIRALAX / GLYCOLAX) packet Take 17 g by mouth daily.  . tamsulosin (FLOMAX) 0.4 MG CAPS capsule Take 0.4 mg by mouth at bedtime. Hold for SBP < 95   No facility-administered encounter medications on file as of 11/22/2016.     Review of Systems  Unable to perform ROS: Dementia    Immunization History  Administered Date(s) Administered  . Influenza Inj Mdck Quad Pf 04/25/2016  . Influenza Whole 04/20/2013  . Influenza-Unspecified 04/12/2014, 04/20/2015  . PPD Test 11/29/2012  . Pneumococcal Conjugate-13 08/01/2016   Pertinent  Health Maintenance Due  Topic Date Due  . INFLUENZA VACCINE  02/05/2017  . PNA vac Low Risk Adult (2 of 2 - PPSV23) 08/01/2017   Fall Risk  06/21/2016  Falls in the past year? No   Functional Status Survey:    Vitals:   11/22/16 1223  BP: 126/73  Pulse: 68  Resp: (!) 24  SpO2: 96%   There is no height or weight on file to calculate BMI. Physical Exam  Constitutional: He appears distressed (mild grimacing).  HENT:  Head: Normocephalic and atraumatic.  Neck: Normal range of motion. Neck supple. No JVD present. No tracheal deviation present. No thyromegaly present.  Cardiovascular: Normal rate and regular rhythm.   No murmur heard. BLE edema +1 R>L  Pulmonary/Chest: Effort normal. No respiratory distress. He has wheezes (upper airway, resolves when upright).  Abdominal: Soft. He exhibits  distension. There is no tenderness.  Hypoactive bowel sounds but present in all four quads, slight firm  Lymphadenopathy:    He has no cervical adenopathy.  Neurological: He is alert. No cranial nerve deficit.  Skin: Skin is warm and dry. He is not diaphoretic.  Psychiatric: He has a normal mood and affect.  Nursing note and vitals reviewed.   Labs reviewed:  Recent Labs  04/19/16 0309  04/27/16 0110 04/27/16 0128 04/27/16 1020 04/28/16 0226  10/31/16 11/04/16 11/21/16  NA 142  < > 141 145  --  142  < > 144 150* 145  K 3.6  < > 3.6 3.9  --  2.9*  < > 2.7* 3.0* 3.1*  CL 111  --  115* 115*  --  111  --   --   --   --   CO2 23  --  20*  --   --  24  --   --   --   --   GLUCOSE 114*  --  122* 112*  --  105*  --   --   --   --   BUN 25*  < > 22* 31*  --  19  < > 24* 32* 25*  CREATININE 1.18  < > 1.11 1.10  --  1.28*  < > 1.1 1.1 1.0  CALCIUM 8.4*  --  8.3*  --   --  8.4*  --   --   --   --   MG  --   --   --   --  2.0  --   --   --   --   --   < > = values in this interval not displayed.  Recent Labs  04/19/16 0309 04/27/16 0110 04/28/16 0226  AST 17 14* 17  ALT 13* 10* 10*  ALKPHOS 75 78 81  BILITOT 0.3 0.7 1.2  PROT 6.7 6.3* 6.3*  ALBUMIN 2.7* 2.6* 2.6*    Recent Labs  02/20/16 2355  04/19/16 0309 04/27/16 0110  04/28/16 0226 07/06/16 09/30/16 0630  WBC 10.1  < > 8.6 9.7  --  8.6 7.6 6.7  NEUTROABS 7.3  --  5.5 6.8  --   --   --   --   HGB 13.0  < > 12.4* 11.8*  < > 12.0* 12.9* 12.4*  HCT 40.5  < > 38.5* 36.8*  < > 37.2* 39* 38*  MCV 94.6  --  92.3 92.7  --  91.4  --   --   PLT 214  < > 313 332  --  346 206 203  < > = values in this interval not displayed. Lab Results  Component Value Date   TSH 5.05 08/08/2016   No results found for: HGBA1C Lab Results  Component Value Date   CHOL 155 08/31/2015   HDL 33 (A) 08/31/2015   LDLCALC 99 08/31/2015   TRIG 116 08/31/2015    Significant Diagnostic Results in last 30 days:  No results  found.  Assessment/Plan  1. FTT (failure to thrive) in adult Progressive weight loss due to dementia and underlying process (see below) Continue Boost BID if he will take it  2. Hydronephrosis, unspecified hydronephrosis type We have decided not work up this issue noted to the left kidney due to his poor overall condition. ?if he has a mass affecting the flow of urine due to his hx of prostate nodule   3. Prostate nodule As above Maintain foley  4. Late onset Alzheimer's disease with behavioral disturbance Progressive functional and cognitive loss over the past year with weight loss Should be advancing towards comfort care only. I will continue to work with his family on this issue He is a DNR and his family does not want him to have a feeding tube or receives aggressive measures. However, they continue to want IVF when necessary and treatment for infections. He has an overall poor quality of life and I feel we should no longer intervene unless he appears uncomfortable  5. Slow transit constipation His abd is distended which is causing him to wheeze and grimace. The nurse put him to bed and on his left side. He began to pass gas, had a bowel movement, and the increased WOB subsided.  Family/ staff Communication: discussed with staff  Labs/tests ordered:  [facility] thursday

## 2016-11-26 DIAGNOSIS — B351 Tinea unguium: Secondary | ICD-10-CM | POA: Diagnosis not present

## 2016-11-26 DIAGNOSIS — D649 Anemia, unspecified: Secondary | ICD-10-CM | POA: Diagnosis not present

## 2016-11-26 DIAGNOSIS — R339 Retention of urine, unspecified: Secondary | ICD-10-CM | POA: Diagnosis not present

## 2016-11-26 DIAGNOSIS — I5032 Chronic diastolic (congestive) heart failure: Secondary | ICD-10-CM | POA: Diagnosis not present

## 2016-11-26 LAB — CBC AND DIFFERENTIAL
HEMATOCRIT: 37 % — AB (ref 41–53)
HEMOGLOBIN: 12.5 g/dL — AB (ref 13.5–17.5)
Platelets: 155 10*3/uL (ref 150–399)
WBC: 7.6 10^3/mL

## 2016-11-26 LAB — BASIC METABOLIC PANEL
BUN: 24 mg/dL — AB (ref 4–21)
Creatinine: 1.1 mg/dL (ref 0.6–1.3)
Glucose: 98 mg/dL
Potassium: 3.7 mmol/L (ref 3.4–5.3)
SODIUM: 146 mmol/L (ref 137–147)

## 2016-11-27 DIAGNOSIS — M6389 Disorders of muscle in diseases classified elsewhere, multiple sites: Secondary | ICD-10-CM | POA: Diagnosis not present

## 2016-11-27 DIAGNOSIS — R293 Abnormal posture: Secondary | ICD-10-CM | POA: Diagnosis not present

## 2016-11-27 DIAGNOSIS — M24561 Contracture, right knee: Secondary | ICD-10-CM | POA: Diagnosis not present

## 2016-11-27 DIAGNOSIS — R0602 Shortness of breath: Secondary | ICD-10-CM | POA: Diagnosis not present

## 2016-11-27 DIAGNOSIS — R609 Edema, unspecified: Secondary | ICD-10-CM | POA: Diagnosis not present

## 2016-11-27 DIAGNOSIS — R278 Other lack of coordination: Secondary | ICD-10-CM | POA: Diagnosis not present

## 2016-11-27 DIAGNOSIS — L89619 Pressure ulcer of right heel, unspecified stage: Secondary | ICD-10-CM | POA: Diagnosis not present

## 2016-11-29 ENCOUNTER — Non-Acute Institutional Stay: Payer: Medicare Other | Admitting: Adult Health

## 2016-11-29 DIAGNOSIS — R140 Abdominal distension (gaseous): Secondary | ICD-10-CM | POA: Diagnosis not present

## 2016-11-29 DIAGNOSIS — M24561 Contracture, right knee: Secondary | ICD-10-CM | POA: Diagnosis not present

## 2016-11-29 DIAGNOSIS — R293 Abnormal posture: Secondary | ICD-10-CM | POA: Diagnosis not present

## 2016-11-29 DIAGNOSIS — R278 Other lack of coordination: Secondary | ICD-10-CM | POA: Diagnosis not present

## 2016-11-29 DIAGNOSIS — R609 Edema, unspecified: Secondary | ICD-10-CM | POA: Diagnosis not present

## 2016-11-29 DIAGNOSIS — M6389 Disorders of muscle in diseases classified elsewhere, multiple sites: Secondary | ICD-10-CM | POA: Diagnosis not present

## 2016-11-29 DIAGNOSIS — L89619 Pressure ulcer of right heel, unspecified stage: Secondary | ICD-10-CM | POA: Diagnosis not present

## 2016-11-29 NOTE — Progress Notes (Signed)
Location:  Occupational psychologist of Service:  ALF (13) Provider:   Cindi Carbon, ANP [place] [phone]   [name], DO  Patient Care Team: [name], DO as PCP - General (Geriatric Medicine) Community, [facility] Angel[facility] Holm, Margreta Journey, NP as Nurse Practitioner (Nurse Practitioner)  Extended Emergency Contact Information Primary Emergency Contact: [name] Address: [address]          [place], [place] [address] [place] Phone: [phone] Relation: Spouse Secondary Emergency Contact: [name], [place] Phone: [phone] Relation: Daughter  Code Status:  DNR Goals of care: Advanced Directive information Advanced Directives 11/25/2016  Does Patient Have a Medical Advance Directive? Yes  Type of Advance Directive Out of facility DNR (pink MOST or yellow form);[place];Living will  Does patient want to make changes to medical advance directive? -  Copy of [place] in Chart? Yes  Pre-existing out of facility DNR order (yellow form or pink MOST form) Yellow form placed in chart (order not valid for inpatient use);Pink MOST form placed in chart (order not valid for inpatient use)     Chief Complaint  Patient presents with  . Medical Management of Chronic Issues    HPI:  Pt is a 83 y.o. male seen today for an acute visit for follow up regarding abd distention. He was seen on 5/21 for complaints of not eating and abd distention, as well as wheezing.  XRAY showed the following CXR: stable bibasilar opacities may represent multifocal infectious process and recommend follow up to resolution  ABD Xray: stable moderately dilated bowel loop, liley colonic 13cm diameter  A rectal tube and a fleets enema were ordered. The rectal tube came out after a few hrs and was not replaced by staff.  On 5/23  there were no changes in the abd xray. He received another fleets enema. The rectal was replaced and I asked that the staff maintain it for 24 hrs.  I am here 11/29/2016 to re evaluate the need for the tube.  Staff report he looks more comfortable and has stable VS. His appetite remains poor. Labs indicated a NA of 146 but otherwise no changes. He has had several bowel movements that are either liquid or soft formed over the past several days.   He is not vomiting, and is alert.  Goals of care are comfort based on his advanced dementia and debility.  In regards to his CXR, this issue was not treated as he had recently had levaquin and does not currently have a fever, cough, congestion, or decreased 02 sats. These concerns were expressed to his son who is a physician.   He is losing weight due to poor intake. Wt Readings from Last 3 Encounters:  11/25/16 206 lb 14.4 oz (93.8 kg)  11/04/16 207 lb (93.9 kg)  10/31/16 213 lb 9.6 oz (96.9 kg)     Past Medical History:  Diagnosis Date  . Allergic rhinitis   . Alzheimer's disease 2013  . Atrial fibrillation ([place])    Long term anti coagulation w/ warfarin fro stroke risk reduction  . BPH (benign prostatic hyperplasia)   . Chronic venous insufficiency 01/12/2013  . Dementia with behavioral disturbance 12/29/13  . Gait disorder   . GERD (gastroesophageal reflux disease)   . History of CVA (cerebrovascular accident) 2001   Rt.MCA  .  Hyperlipidemia   . Lactose intolerance   . Long term (current) use of anticoagulants 11/23/2012   Long-term anticoagulation with warfarin for stroke risk reduction related to AF   Past Surgical History:  Procedure Laterality Date  . CATARACT EXTRACTION W/ INTRAOCULAR LENS  IMPLANT, BILATERAL    . HERNIA REPAIR Right 1970s  . KNEE SURGERY Right 2000  . MASS EXCISION Left 07/30/2013   Procedure: EXCISION OF BASAL CELL CARCINOMA  LEFT FOREHEAD ;  Surgeon: Irene Limbo, MD;  Location: [facility];   Service: Plastics;  Laterality: Left;  . RETINAL DETACHMENT SURGERY Right 2009    Allergies  Allergen Reactions  . Penicillins Rash  . Bee Venom   . Risperidone And Related     Unstable gait    Outpatient Encounter Prescriptions as of 11/29/2016  Medication Sig  . apixaban (ELIQUIS) 2.5 MG TABS tablet Take 2.5 mg by mouth 2 (two) times daily.  . ciclopirox (LOPROX) 0.77 % cream Apply topically 2 (two) times daily as needed (redness).  Marland Kitchen ipratropium-albuterol (DUONEB) 0.5-2.5 (3) MG/3ML SOLN Take 3 mLs by nebulization 3 (three) times daily. q 6 hrs prn  . lactose free nutrition (BOOST) LIQD Take 237 mLs by mouth 2 (two) times daily between meals.  . nitroGLYCERIN (NITROSTAT) 0.4 MG SL tablet Place 0.4 mg under the tongue every 5 (five) minutes as needed for chest pain.  . polyethylene glycol (MIRALAX / GLYCOLAX) packet Take 17 g by mouth daily.  . potassium chloride SA (K-DUR,KLOR-CON) 20 MEQ tablet Take 40 mEq by mouth daily.  . tamsulosin (FLOMAX) 0.4 MG CAPS capsule Take 0.4 mg by mouth at bedtime. Hold for SBP < 95   No facility-administered encounter medications on file as of 11/29/2016.     Review of Systems  Unable to perform ROS: Dementia    Immunization History  Administered Date(s) Administered  . Influenza Inj Mdck Quad Pf 04/25/2016  . Influenza Whole 04/20/2013  . Influenza-Unspecified 04/12/2014, 04/20/2015  . PPD Test 11/29/2012  . Pneumococcal Conjugate-13 08/01/2016   Pertinent  Health Maintenance Due  Topic Date Due  . INFLUENZA VACCINE  02/05/2017  . PNA vac Low Risk Adult (2 of 2 - PPSV23) 08/01/2017   Fall Risk  06/21/2016  Falls in the past year? No   Functional Status Survey:    Vitals:   11/29/16 0937  BP: 116/70  Pulse: 68  Resp: 18  Temp: 97 F (36.1 C)  SpO2: 93%   There is no height or weight on file to calculate BMI. Physical Exam  Constitutional: No distress.  HENT:  Head: Normocephalic and atraumatic.  Clenched teeth for oral  exam  Neck: No JVD present.  Cardiovascular: Normal rate and regular rhythm.   No murmur heard. BLE edema +1  Pulmonary/Chest: Effort normal and breath sounds normal. No respiratory distress. He has no wheezes.  Abdominal: Soft. Bowel sounds are normal. He exhibits no distension. There is no tenderness.  Lymphadenopathy:    He has no cervical adenopathy.  Neurological: He is alert.  Minimally verbal, not able to f/c. No obvious focal deficit  Skin: Skin is warm and dry. He is not diaphoretic.  Psychiatric: He has a normal mood and affect.  Nursing note and vitals reviewed.   Labs reviewed:  Recent Labs  04/19/16 0309  04/27/16 0110 04/27/16 0128 04/27/16 1020 04/28/16 0226  11/04/16 11/21/16 11/26/16  NA 142  < > 141 145  --  142  < > 150* 145 146  K 3.6  < > 3.6 3.9  --  2.9*  < > 3.0* 3.1* 3.7  CL 111  --  115* 115*  --  111  --   --   --   --   CO2 23  --  20*  --   --  24  --   --   --   --   GLUCOSE 114*  --  122* 112*  --  105*  --   --   --   --   BUN 25*  < > 22* 31*  --  19  < > 32* 25* 24*  CREATININE 1.18  < > 1.11 1.10  --  1.28*  < > 1.1 1.0 1.1  CALCIUM 8.4*  --  8.3*  --   --  8.4*  --   --   --   --   MG  --   --   --   --  2.0  --   --   --   --   --   < > = values in this interval not displayed.  Recent Labs  04/19/16 0309 04/27/16 0110 04/28/16 0226  AST 17 14* 17  ALT 13* 10* 10*  ALKPHOS 75 78 81  BILITOT 0.3 0.7 1.2  PROT 6.7 6.3* 6.3*  ALBUMIN 2.7* 2.6* 2.6*    Recent Labs  02/20/16 2355  04/19/16 0309 04/27/16 0110  04/28/16 0226 07/06/16 09/30/16 0630 11/26/16  WBC 10.1  < > 8.6 9.7  --  8.6 7.6 6.7 7.6  NEUTROABS 7.3  --  5.5 6.8  --   --   --   --   --   HGB 13.0  < > 12.4* 11.8*  < > 12.0* 12.9* 12.4* 12.5*  HCT 40.5  < > 38.5* 36.8*  < > 37.2* 39* 38* 37*  MCV 94.6  --  92.3 92.7  --  91.4  --   --   --   PLT 214  < > 313 332  --  346 206 203 155  < > = values in this interval not displayed. Lab Results  Component Value Date    TSH 5.05 08/08/2016   No results found for: HGBA1C Lab Results  Component Value Date   CHOL 155 08/31/2015   HDL 33 (A) 08/31/2015   LDLCALC 99 08/31/2015   TRIG 116 08/31/2015    Significant Diagnostic Results in last 30 days:  No results found.  Assessment/Plan  1) ABD distention  Improved abd exam, improved symptoms May remove rectal tube but should replace over the weekend if abd distention worsens and he does not respond to being placed on his left side Continue MIralax 17 gm qd Clear liquids today, thickened, then advanced to puree as tolerated BMP Tuesday 5/29 ? If his diaphragm pushing uo on his abd contributed to the findings on xray. Continues with no symptoms so will hold off treatment for now.    Family/ staff Communication: discussed with staff  Labs/tests ordered:  BMP

## 2016-12-03 ENCOUNTER — Encounter: Payer: Self-pay | Admitting: Internal Medicine

## 2016-12-03 DIAGNOSIS — E860 Dehydration: Secondary | ICD-10-CM | POA: Diagnosis not present

## 2016-12-03 DIAGNOSIS — D649 Anemia, unspecified: Secondary | ICD-10-CM | POA: Diagnosis not present

## 2016-12-03 LAB — BASIC METABOLIC PANEL
BUN: 23 mg/dL — AB (ref 4–21)
Creatinine: 1 mg/dL (ref 0.6–1.3)
Glucose: 104 mg/dL
Potassium: 3.2 mmol/L — AB (ref 3.4–5.3)
Sodium: 146 mmol/L (ref 137–147)

## 2016-12-04 DIAGNOSIS — R278 Other lack of coordination: Secondary | ICD-10-CM | POA: Diagnosis not present

## 2016-12-04 DIAGNOSIS — M24561 Contracture, right knee: Secondary | ICD-10-CM | POA: Diagnosis not present

## 2016-12-04 DIAGNOSIS — R293 Abnormal posture: Secondary | ICD-10-CM | POA: Diagnosis not present

## 2016-12-04 DIAGNOSIS — L89619 Pressure ulcer of right heel, unspecified stage: Secondary | ICD-10-CM | POA: Diagnosis not present

## 2016-12-04 DIAGNOSIS — M6389 Disorders of muscle in diseases classified elsewhere, multiple sites: Secondary | ICD-10-CM | POA: Diagnosis not present

## 2016-12-04 DIAGNOSIS — R609 Edema, unspecified: Secondary | ICD-10-CM | POA: Diagnosis not present

## 2016-12-05 DIAGNOSIS — R339 Retention of urine, unspecified: Secondary | ICD-10-CM | POA: Diagnosis not present

## 2016-12-05 DIAGNOSIS — Z79899 Other long term (current) drug therapy: Secondary | ICD-10-CM | POA: Diagnosis not present

## 2016-12-05 DIAGNOSIS — D649 Anemia, unspecified: Secondary | ICD-10-CM | POA: Diagnosis not present

## 2016-12-05 DIAGNOSIS — I5032 Chronic diastolic (congestive) heart failure: Secondary | ICD-10-CM | POA: Diagnosis not present

## 2016-12-12 ENCOUNTER — Non-Acute Institutional Stay: Payer: Medicare Other | Admitting: Adult Health

## 2016-12-12 DIAGNOSIS — E860 Dehydration: Secondary | ICD-10-CM | POA: Diagnosis not present

## 2016-12-12 DIAGNOSIS — R634 Abnormal weight loss: Secondary | ICD-10-CM | POA: Diagnosis not present

## 2016-12-12 DIAGNOSIS — R140 Abdominal distension (gaseous): Secondary | ICD-10-CM

## 2016-12-12 DIAGNOSIS — Z7189 Other specified counseling: Secondary | ICD-10-CM | POA: Diagnosis not present

## 2016-12-12 DIAGNOSIS — D649 Anemia, unspecified: Secondary | ICD-10-CM | POA: Diagnosis not present

## 2016-12-12 DIAGNOSIS — F0281 Dementia in other diseases classified elsewhere with behavioral disturbance: Secondary | ICD-10-CM | POA: Diagnosis not present

## 2016-12-12 DIAGNOSIS — G300 Alzheimer's disease with early onset: Secondary | ICD-10-CM

## 2016-12-13 ENCOUNTER — Encounter: Payer: Self-pay | Admitting: Adult Health

## 2016-12-13 IMAGING — CR DG CHEST 1V
1 series · 1 of 1 positions shown · non-contrast
Comparison: Chest radiograph February 20, 2016

CLINICAL DATA: Urinary retention.  Cough.  History dementia, CHF.

EXAM:
CHEST 1 VIEW

[chest ap]
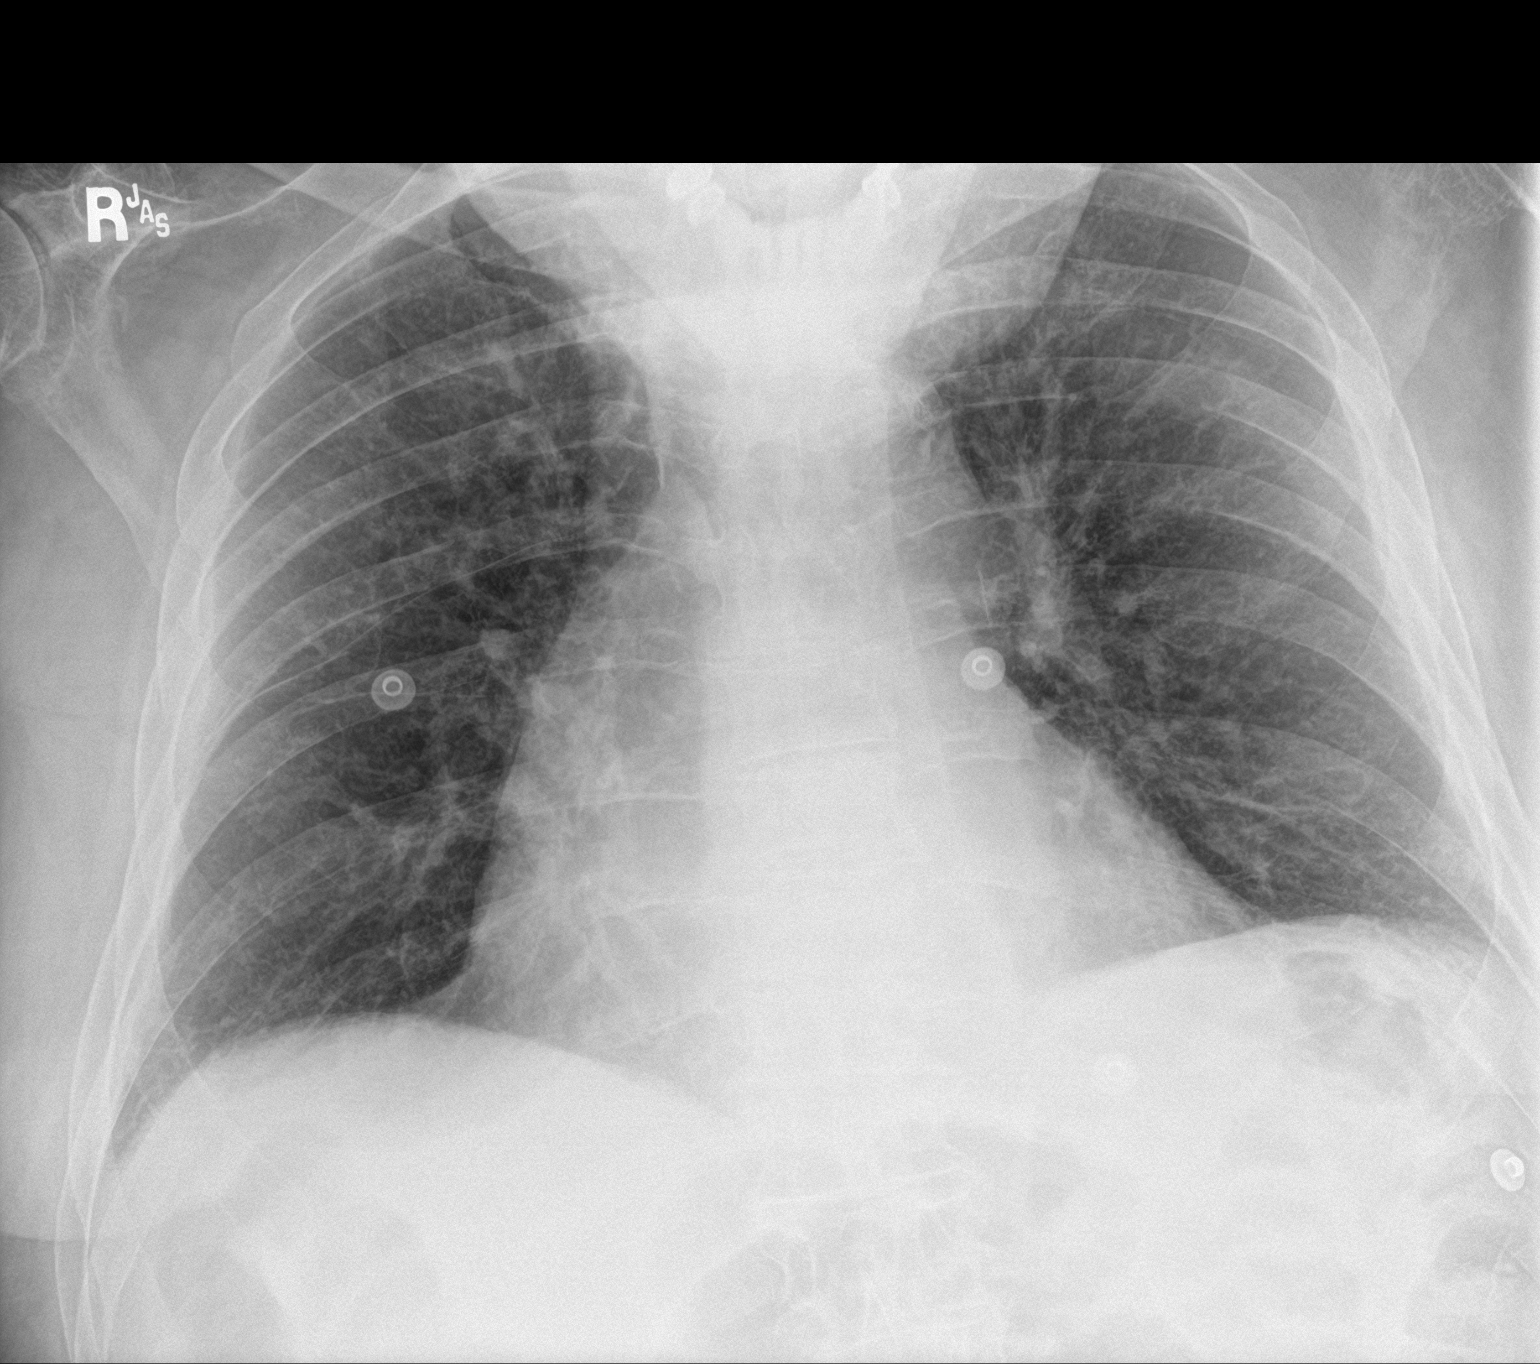

[1 of 1 positions shown; findings below may reference images not displayed]

FINDINGS: The cardiac silhouette is mildly enlarged, mediastinal silhouette is
nonsuspicious. Calcified aortic knob. Bibasilar strandy densities.
Mild pulmonary vascular congestion without pleural effusion or focal
consolidation. No pneumothorax. Osteopenia.
IMPRESSION: Mild cardiomegaly and pulmonary vascular congestion.

Bibasilar atelectasis.

## 2016-12-13 NOTE — Progress Notes (Signed)
Location:  Occupational psychologist of Service:  ALF (13) Provider:   Cindi Carbon, ANP [employer] [phone]   [name], DO  Patient Care Team: [name], DO as PCP - General (Geriatric Medicine) Community, Well Angelique Holm, Margreta Journey, NP as Nurse Practitioner (Nurse Practitioner)  Extended Emergency Contact Information Primary Emergency Contact: [name] Address: [address]          [place], [place] [address] [place] Phone: [phone] Relation: Spouse Secondary Emergency Contact: [name], [place] Phone: [phone] Relation: Daughter  Code Status:  DNR Goals of care: Advanced Directive information Advanced Directives 11/25/2016  Does Patient Have a Medical Advance Directive? Yes  Type of Advance Directive Out of facility DNR (pink MOST or yellow form);[place];Living will  Does patient want to make changes to medical advance directive? -  Copy of [place] in Chart? Yes  Pre-existing out of facility DNR order (yellow form or pink MOST form) Yellow form placed in chart (order not valid for inpatient use);Pink MOST form placed in chart (order not valid for inpatient use)     Chief Complaint  Patient presents with  . Acute Visit    weight loss    HPI:  Pt is a 83 y.o. male seen today for an acute visit for follow up regarding abd distention and weight loss. He was seen on 5/21 for complaints of not eating and abd distention, as well as wheezing.  XRAY showed the following CXR: stable bibasilar opacities may represent multifocal infectious process and recommend follow up to resolution  ABD Xray: stable moderately dilated bowel loop, liley colonic 13cm diameter  He has not had any cough, further wheezing, decreased 02 sats or fever. The CXR was not treated as he was asymptomatic and  it was a one view with abd distention which complicated the picture. He received a rectal tube for the distention which resolved but later returned. At this point the staff is intermittently using the tube if there is distention that does not resolve when he lies down on the left side. This is thought to be due to immobility and constipation but we have not done any additional studies due to his age/dementia/debility.  He continues to eat poorly but has been able to take in fluids and maintain his own hydration. He is also clamping his mouth shut and resisting taking his pills.   He is losing weight due to poor intake. Wt Readings from Last 3 Encounters:  12/13/16 195 lb (88.5 kg)  11/25/16 206 lb 14.4 oz (93.8 kg)  11/04/16 207 lb (93.9 kg)     Past Medical History:  Diagnosis Date  . Allergic rhinitis   . Alzheimer's disease 2013  . Atrial fibrillation ([place])    Long term anti coagulation w/ warfarin fro stroke risk reduction  . BPH (benign prostatic hyperplasia)   . Chronic venous insufficiency 01/12/2013  . Dementia with behavioral disturbance 12/29/13  . Gait disorder   . GERD (gastroesophageal reflux disease)   . History of CVA (cerebrovascular accident) 2001   Rt.MCA  . Hyperlipidemia   . Lactose intolerance   . Long term (current) use of anticoagulants 11/23/2012   Long-term anticoagulation with warfarin for stroke risk reduction related to AF   Past Surgical History:  Procedure Laterality Date  . CATARACT EXTRACTION W/  INTRAOCULAR LENS  IMPLANT, BILATERAL    . HERNIA REPAIR Right 1970s  . KNEE SURGERY Right 2000  . MASS EXCISION Left 07/30/2013   Procedure: EXCISION OF BASAL CELL CARCINOMA  LEFT FOREHEAD ;  Surgeon: Irene Limbo, MD;  Location: [facility];  Service: Plastics;  Laterality: Left;  . RETINAL DETACHMENT SURGERY Right 2009    Allergies  Allergen Reactions  . Penicillins Rash  . Bee Venom   . Risperidone And Related     Unstable gait     Outpatient Encounter Prescriptions as of 12/12/2016  Medication Sig  . apixaban (ELIQUIS) 2.5 MG TABS tablet Take 2.5 mg by mouth 2 (two) times daily.  . ciclopirox (LOPROX) 0.77 % cream Apply topically 2 (two) times daily as needed (redness).  Marland Kitchen ipratropium-albuterol (DUONEB) 0.5-2.5 (3) MG/3ML SOLN Take 3 mLs by nebulization 3 (three) times daily. q 6 hrs prn  . lactose free nutrition (BOOST) LIQD Take 237 mLs by mouth 2 (two) times daily between meals.  . nitroGLYCERIN (NITROSTAT) 0.4 MG SL tablet Place 0.4 mg under the tongue every 5 (five) minutes as needed for chest pain.  . polyethylene glycol (MIRALAX / GLYCOLAX) packet Take 17 g by mouth daily.  . potassium chloride SA (K-DUR,KLOR-CON) 20 MEQ tablet Take 40 mEq by mouth daily.  . tamsulosin (FLOMAX) 0.4 MG CAPS capsule Take 0.4 mg by mouth at bedtime. Hold for SBP < 95   No facility-administered encounter medications on file as of 12/12/2016.     Review of Systems  Unable to perform ROS: Dementia    Immunization History  Administered Date(s) Administered  . Influenza Inj Mdck Quad Pf 04/25/2016  . Influenza Whole 04/20/2013  . Influenza-Unspecified 04/12/2014, 04/20/2015  . PPD Test 11/29/2012  . Pneumococcal Conjugate-13 08/01/2016   Pertinent  Health Maintenance Due  Topic Date Due  . INFLUENZA VACCINE  02/05/2017  . PNA vac Low Risk Adult (2 of 2 - PPSV23) 08/01/2017   Fall Risk  06/21/2016  Falls in the past year? No   Functional Status Survey:    Vitals:   12/13/16 1404  BP: 140/68  Pulse: 76  Resp: 20  Temp: 98 F (36.7 C)  Weight: 195 lb (88.5 kg)   Body mass index is 26.45 kg/m. Physical Exam  Constitutional: No distress.  HENT:  Head: Normocephalic and atraumatic.  Neck: No JVD present.  Cardiovascular: Normal rate and regular rhythm.   No murmur heard. BLE edema +1  Pulmonary/Chest: Effort normal and breath sounds normal. No respiratory distress. He has no wheezes.  Abdominal: Soft. Bowel  sounds are normal. He exhibits distension (mild, soft). There is no tenderness.  Lymphadenopathy:    He has no cervical adenopathy.  Neurological: He is alert.  Minimally verbal, not able to f/c. No obvious focal deficit  Skin: Skin is warm and dry. He is not diaphoretic.  Psychiatric: He has a normal mood and affect.  Nursing note and vitals reviewed.   Labs reviewed:  Recent Labs  04/19/16 0309  04/27/16 0110 04/27/16 0128 04/27/16 1020 04/28/16 0226  11/21/16 11/26/16 12/03/16 0600  NA 142  < > 141 145  --  142  < > 145 146 146  K 3.6  < > 3.6 3.9  --  2.9*  < > 3.1* 3.7 3.2*  CL 111  --  115* 115*  --  111  --   --   --   --   CO2 23  --  20*  --   --  24  --   --   --   --   GLUCOSE 114*  --  122* 112*  --  105*  --   --   --   --   BUN 25*  < > 22* 31*  --  19  < > 25* 24* 23*  CREATININE 1.18  < > 1.11 1.10  --  1.28*  < > 1.0 1.1 1.0  CALCIUM 8.4*  --  8.3*  --   --  8.4*  --   --   --   --   MG  --   --   --   --  2.0  --   --   --   --   --   < > = values in this interval not displayed.  Recent Labs  04/19/16 0309 04/27/16 0110 04/28/16 0226  AST 17 14* 17  ALT 13* 10* 10*  ALKPHOS 75 78 81  BILITOT 0.3 0.7 1.2  PROT 6.7 6.3* 6.3*  ALBUMIN 2.7* 2.6* 2.6*    Recent Labs  02/20/16 2355  04/19/16 0309 04/27/16 0110  04/28/16 0226 07/06/16 09/30/16 0630 11/26/16  WBC 10.1  < > 8.6 9.7  --  8.6 7.6 6.7 7.6  NEUTROABS 7.3  --  5.5 6.8  --   --   --   --   --   HGB 13.0  < > 12.4* 11.8*  < > 12.0* 12.9* 12.4* 12.5*  HCT 40.5  < > 38.5* 36.8*  < > 37.2* 39* 38* 37*  MCV 94.6  --  92.3 92.7  --  91.4  --   --   --   PLT 214  < > 313 332  --  346 206 203 155  < > = values in this interval not displayed. Lab Results  Component Value Date   TSH 5.05 08/08/2016   No results found for: HGBA1C Lab Results  Component Value Date   CHOL 155 08/31/2015   HDL 33 (A) 08/31/2015   LDLCALC 99 08/31/2015   TRIG 116 08/31/2015    Significant Diagnostic Results  in last 30 days:  No results found.  Assessment/Plan  1) Abd distention Improved Continue rectal tube prn for distention  Add Dulcolax supp qod Encourage fluids, no further work up or aggressive measures  2) Weight loss Due to dementia and underlying abd distention Continue nutritional supplements and weight monitoring  3) Alz dementia Nearing end stage, clamping mouth closed for food and pills  4) Advanced care planning I emailed his son and updated him on his decline in weight. I asked if I could consult hospice as his wife has had a difficult time with his illness. He said he was discuss it with his sisters and get back with me. I would like to move things in a direction that focuses soley on comfort with no more labs or IV fluids.       Family/ staff Communication: discussed with staff  Labs/tests ordered:  BMP weekly

## 2016-12-14 IMAGING — CR DG ABD PORTABLE 1V
1 series · 1 of 1 positions shown · non-contrast
Comparison: 02/20/2011

CLINICAL DATA: Chronic abdominal pain

EXAM:
PORTABLE ABDOMEN - 1 VIEW

[AP]
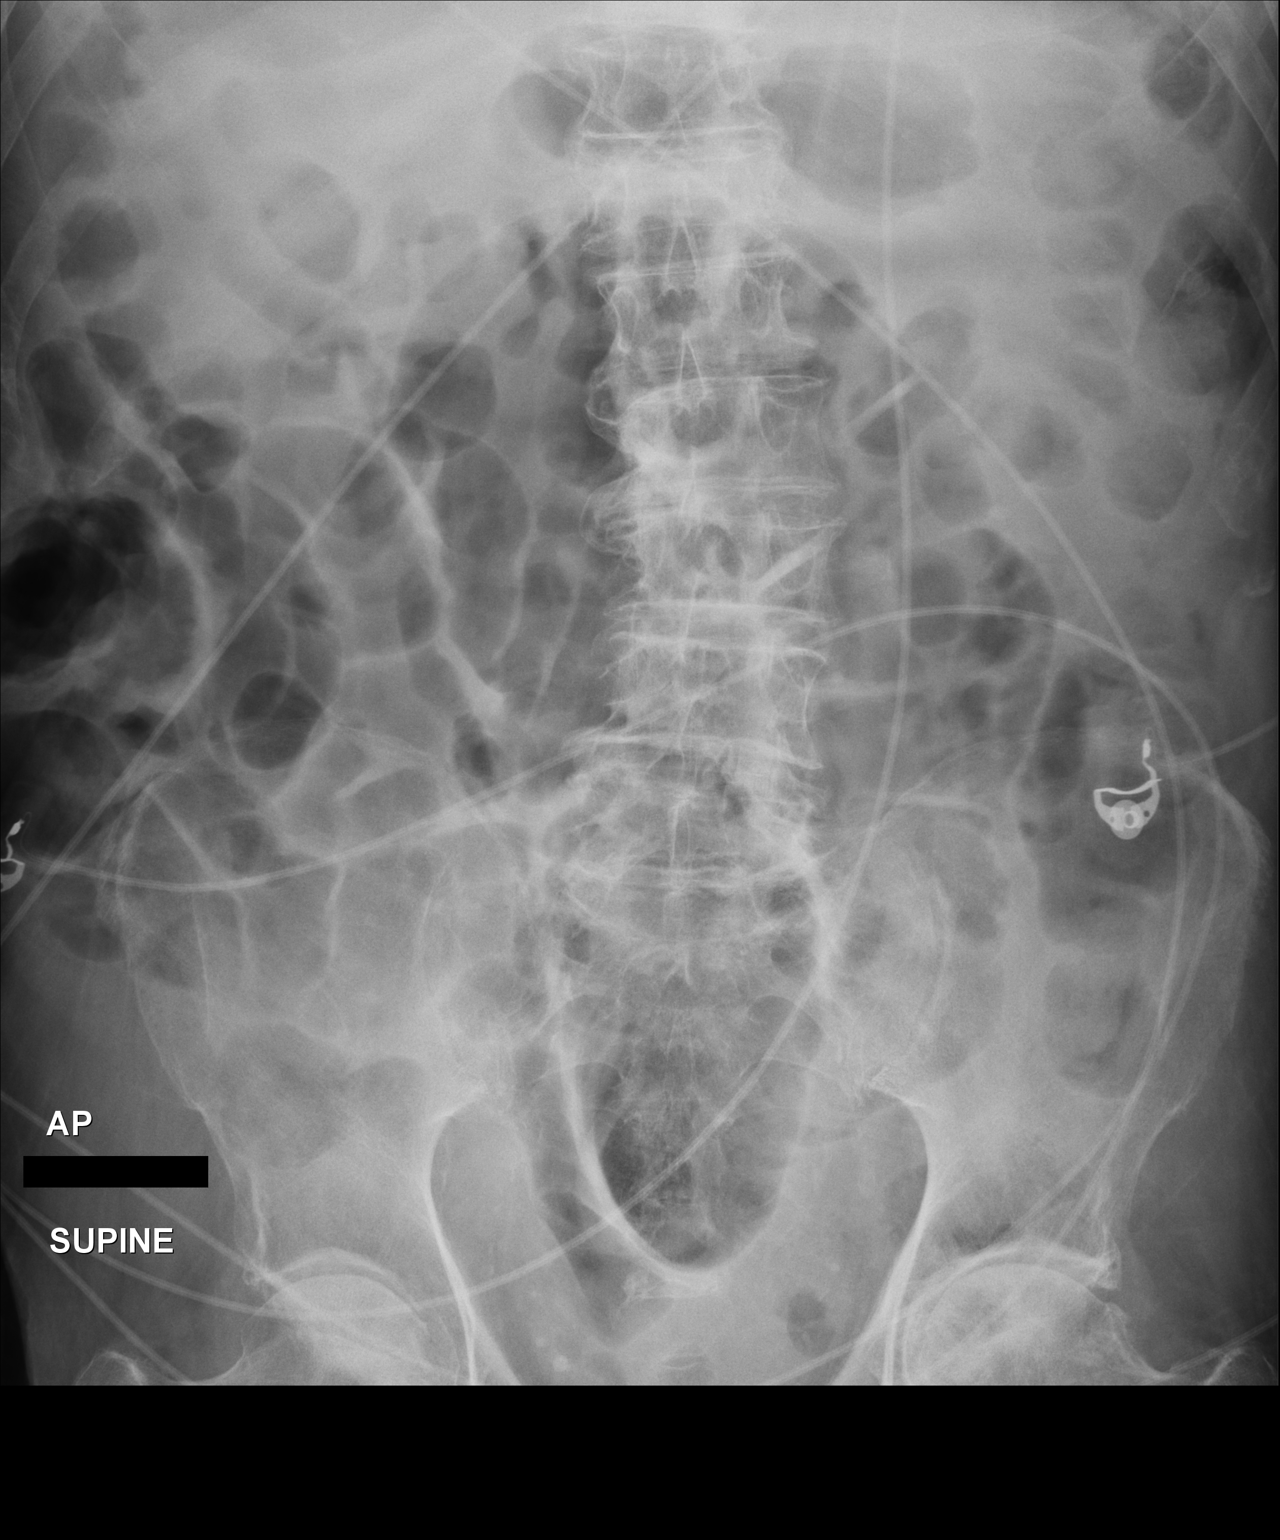

[1 of 1 positions shown; findings below may reference images not displayed]

FINDINGS: Mild gaseous distended small bowel loops are noted mid abdomen
suspicious for ileus, enteritis or early bowel obstruction.
Degenerative changes and mild levoscoliosis lumbar spine. Some
colonic gas noted in transverse colon.
IMPRESSION: Mild gaseous distended small bowel loops mid abdomen suspicious for
ileus, enteritis or early bowel obstruction.

## 2016-12-19 DIAGNOSIS — D649 Anemia, unspecified: Secondary | ICD-10-CM | POA: Diagnosis not present

## 2016-12-19 DIAGNOSIS — E860 Dehydration: Secondary | ICD-10-CM | POA: Diagnosis not present

## 2016-12-21 DIAGNOSIS — E785 Hyperlipidemia, unspecified: Secondary | ICD-10-CM | POA: Diagnosis not present

## 2016-12-21 DIAGNOSIS — K219 Gastro-esophageal reflux disease without esophagitis: Secondary | ICD-10-CM | POA: Diagnosis not present

## 2016-12-21 DIAGNOSIS — M2450 Contracture, unspecified joint: Secondary | ICD-10-CM | POA: Diagnosis not present

## 2016-12-21 DIAGNOSIS — I679 Cerebrovascular disease, unspecified: Secondary | ICD-10-CM | POA: Diagnosis not present

## 2016-12-21 DIAGNOSIS — N183 Chronic kidney disease, stage 3 (moderate): Secondary | ICD-10-CM | POA: Diagnosis not present

## 2016-12-21 DIAGNOSIS — G309 Alzheimer's disease, unspecified: Secondary | ICD-10-CM | POA: Diagnosis not present

## 2016-12-21 DIAGNOSIS — I25810 Atherosclerosis of coronary artery bypass graft(s) without angina pectoris: Secondary | ICD-10-CM | POA: Diagnosis not present

## 2016-12-21 DIAGNOSIS — R1310 Dysphagia, unspecified: Secondary | ICD-10-CM | POA: Diagnosis not present

## 2016-12-21 DIAGNOSIS — N401 Enlarged prostate with lower urinary tract symptoms: Secondary | ICD-10-CM | POA: Diagnosis not present

## 2016-12-21 DIAGNOSIS — I4891 Unspecified atrial fibrillation: Secondary | ICD-10-CM | POA: Diagnosis not present

## 2016-12-21 DIAGNOSIS — I509 Heart failure, unspecified: Secondary | ICD-10-CM | POA: Diagnosis not present

## 2016-12-24 DIAGNOSIS — I679 Cerebrovascular disease, unspecified: Secondary | ICD-10-CM | POA: Diagnosis not present

## 2016-12-24 DIAGNOSIS — N183 Chronic kidney disease, stage 3 (moderate): Secondary | ICD-10-CM | POA: Diagnosis not present

## 2016-12-24 DIAGNOSIS — G309 Alzheimer's disease, unspecified: Secondary | ICD-10-CM | POA: Diagnosis not present

## 2016-12-24 DIAGNOSIS — I25810 Atherosclerosis of coronary artery bypass graft(s) without angina pectoris: Secondary | ICD-10-CM | POA: Diagnosis not present

## 2016-12-24 DIAGNOSIS — I4891 Unspecified atrial fibrillation: Secondary | ICD-10-CM | POA: Diagnosis not present

## 2016-12-24 DIAGNOSIS — I509 Heart failure, unspecified: Secondary | ICD-10-CM | POA: Diagnosis not present

## 2016-12-26 DIAGNOSIS — G309 Alzheimer's disease, unspecified: Secondary | ICD-10-CM | POA: Diagnosis not present

## 2016-12-26 DIAGNOSIS — N183 Chronic kidney disease, stage 3 (moderate): Secondary | ICD-10-CM | POA: Diagnosis not present

## 2016-12-26 DIAGNOSIS — I509 Heart failure, unspecified: Secondary | ICD-10-CM | POA: Diagnosis not present

## 2016-12-26 DIAGNOSIS — I4891 Unspecified atrial fibrillation: Secondary | ICD-10-CM | POA: Diagnosis not present

## 2016-12-26 DIAGNOSIS — I679 Cerebrovascular disease, unspecified: Secondary | ICD-10-CM | POA: Diagnosis not present

## 2016-12-26 DIAGNOSIS — E860 Dehydration: Secondary | ICD-10-CM | POA: Diagnosis not present

## 2016-12-26 DIAGNOSIS — D649 Anemia, unspecified: Secondary | ICD-10-CM | POA: Diagnosis not present

## 2016-12-26 DIAGNOSIS — I25810 Atherosclerosis of coronary artery bypass graft(s) without angina pectoris: Secondary | ICD-10-CM | POA: Diagnosis not present

## 2016-12-26 LAB — BASIC METABOLIC PANEL
BUN: 20 (ref 4–21)
Creatinine: 0.8 (ref 0.6–1.3)
Glucose: 91
Potassium: 4 (ref 3.4–5.3)
Sodium: 137 (ref 137–147)

## 2016-12-27 DIAGNOSIS — I25810 Atherosclerosis of coronary artery bypass graft(s) without angina pectoris: Secondary | ICD-10-CM | POA: Diagnosis not present

## 2016-12-27 DIAGNOSIS — I509 Heart failure, unspecified: Secondary | ICD-10-CM | POA: Diagnosis not present

## 2016-12-27 DIAGNOSIS — I679 Cerebrovascular disease, unspecified: Secondary | ICD-10-CM | POA: Diagnosis not present

## 2016-12-27 DIAGNOSIS — G309 Alzheimer's disease, unspecified: Secondary | ICD-10-CM | POA: Diagnosis not present

## 2016-12-27 DIAGNOSIS — I4891 Unspecified atrial fibrillation: Secondary | ICD-10-CM | POA: Diagnosis not present

## 2016-12-27 DIAGNOSIS — N183 Chronic kidney disease, stage 3 (moderate): Secondary | ICD-10-CM | POA: Diagnosis not present

## 2017-01-02 DIAGNOSIS — G309 Alzheimer's disease, unspecified: Secondary | ICD-10-CM | POA: Diagnosis not present

## 2017-01-02 DIAGNOSIS — I509 Heart failure, unspecified: Secondary | ICD-10-CM | POA: Diagnosis not present

## 2017-01-02 DIAGNOSIS — I679 Cerebrovascular disease, unspecified: Secondary | ICD-10-CM | POA: Diagnosis not present

## 2017-01-02 DIAGNOSIS — I5032 Chronic diastolic (congestive) heart failure: Secondary | ICD-10-CM | POA: Diagnosis not present

## 2017-01-02 DIAGNOSIS — I4891 Unspecified atrial fibrillation: Secondary | ICD-10-CM | POA: Diagnosis not present

## 2017-01-02 DIAGNOSIS — N183 Chronic kidney disease, stage 3 (moderate): Secondary | ICD-10-CM | POA: Diagnosis not present

## 2017-01-02 DIAGNOSIS — I25810 Atherosclerosis of coronary artery bypass graft(s) without angina pectoris: Secondary | ICD-10-CM | POA: Diagnosis not present

## 2017-01-02 DIAGNOSIS — D649 Anemia, unspecified: Secondary | ICD-10-CM | POA: Diagnosis not present

## 2017-01-02 LAB — BASIC METABOLIC PANEL
BUN: 24 — AB (ref 4–21)
Creatinine: 1 (ref 0.6–1.3)
Glucose: 94
Potassium: 4 (ref 3.4–5.3)
Sodium: 136 — AB (ref 137–147)

## 2017-01-05 DIAGNOSIS — I4891 Unspecified atrial fibrillation: Secondary | ICD-10-CM | POA: Diagnosis not present

## 2017-01-05 DIAGNOSIS — E785 Hyperlipidemia, unspecified: Secondary | ICD-10-CM | POA: Diagnosis not present

## 2017-01-05 DIAGNOSIS — I509 Heart failure, unspecified: Secondary | ICD-10-CM | POA: Diagnosis not present

## 2017-01-05 DIAGNOSIS — K219 Gastro-esophageal reflux disease without esophagitis: Secondary | ICD-10-CM | POA: Diagnosis not present

## 2017-01-05 DIAGNOSIS — I25810 Atherosclerosis of coronary artery bypass graft(s) without angina pectoris: Secondary | ICD-10-CM | POA: Diagnosis not present

## 2017-01-05 DIAGNOSIS — R1310 Dysphagia, unspecified: Secondary | ICD-10-CM | POA: Diagnosis not present

## 2017-01-05 DIAGNOSIS — I679 Cerebrovascular disease, unspecified: Secondary | ICD-10-CM | POA: Diagnosis not present

## 2017-01-05 DIAGNOSIS — N401 Enlarged prostate with lower urinary tract symptoms: Secondary | ICD-10-CM | POA: Diagnosis not present

## 2017-01-05 DIAGNOSIS — G309 Alzheimer's disease, unspecified: Secondary | ICD-10-CM | POA: Diagnosis not present

## 2017-01-05 DIAGNOSIS — N183 Chronic kidney disease, stage 3 (moderate): Secondary | ICD-10-CM | POA: Diagnosis not present

## 2017-01-05 DIAGNOSIS — M2450 Contracture, unspecified joint: Secondary | ICD-10-CM | POA: Diagnosis not present

## 2017-01-06 DIAGNOSIS — N183 Chronic kidney disease, stage 3 (moderate): Secondary | ICD-10-CM | POA: Diagnosis not present

## 2017-01-06 DIAGNOSIS — I679 Cerebrovascular disease, unspecified: Secondary | ICD-10-CM | POA: Diagnosis not present

## 2017-01-06 DIAGNOSIS — I509 Heart failure, unspecified: Secondary | ICD-10-CM | POA: Diagnosis not present

## 2017-01-06 DIAGNOSIS — I4891 Unspecified atrial fibrillation: Secondary | ICD-10-CM | POA: Diagnosis not present

## 2017-01-06 DIAGNOSIS — I25810 Atherosclerosis of coronary artery bypass graft(s) without angina pectoris: Secondary | ICD-10-CM | POA: Diagnosis not present

## 2017-01-06 DIAGNOSIS — G309 Alzheimer's disease, unspecified: Secondary | ICD-10-CM | POA: Diagnosis not present

## 2017-01-09 DIAGNOSIS — I679 Cerebrovascular disease, unspecified: Secondary | ICD-10-CM | POA: Diagnosis not present

## 2017-01-09 DIAGNOSIS — I5032 Chronic diastolic (congestive) heart failure: Secondary | ICD-10-CM | POA: Diagnosis not present

## 2017-01-09 DIAGNOSIS — G309 Alzheimer's disease, unspecified: Secondary | ICD-10-CM | POA: Diagnosis not present

## 2017-01-09 DIAGNOSIS — E860 Dehydration: Secondary | ICD-10-CM | POA: Diagnosis not present

## 2017-01-09 DIAGNOSIS — I4891 Unspecified atrial fibrillation: Secondary | ICD-10-CM | POA: Diagnosis not present

## 2017-01-09 DIAGNOSIS — D649 Anemia, unspecified: Secondary | ICD-10-CM | POA: Diagnosis not present

## 2017-01-09 DIAGNOSIS — I509 Heart failure, unspecified: Secondary | ICD-10-CM | POA: Diagnosis not present

## 2017-01-09 DIAGNOSIS — N183 Chronic kidney disease, stage 3 (moderate): Secondary | ICD-10-CM | POA: Diagnosis not present

## 2017-01-09 DIAGNOSIS — I25810 Atherosclerosis of coronary artery bypass graft(s) without angina pectoris: Secondary | ICD-10-CM | POA: Diagnosis not present

## 2017-01-09 LAB — BASIC METABOLIC PANEL
BUN: 24 — AB (ref 4–21)
Creatinine: 1 (ref 0.6–1.3)
Glucose: 98
Potassium: 3.7 (ref 3.4–5.3)
Sodium: 139 (ref 137–147)

## 2017-01-15 DIAGNOSIS — N183 Chronic kidney disease, stage 3 (moderate): Secondary | ICD-10-CM | POA: Diagnosis not present

## 2017-01-15 DIAGNOSIS — I4891 Unspecified atrial fibrillation: Secondary | ICD-10-CM | POA: Diagnosis not present

## 2017-01-15 DIAGNOSIS — I25810 Atherosclerosis of coronary artery bypass graft(s) without angina pectoris: Secondary | ICD-10-CM | POA: Diagnosis not present

## 2017-01-15 DIAGNOSIS — I679 Cerebrovascular disease, unspecified: Secondary | ICD-10-CM | POA: Diagnosis not present

## 2017-01-15 DIAGNOSIS — I509 Heart failure, unspecified: Secondary | ICD-10-CM | POA: Diagnosis not present

## 2017-01-15 DIAGNOSIS — G309 Alzheimer's disease, unspecified: Secondary | ICD-10-CM | POA: Diagnosis not present

## 2017-01-16 DIAGNOSIS — D649 Anemia, unspecified: Secondary | ICD-10-CM | POA: Diagnosis not present

## 2017-01-16 DIAGNOSIS — E860 Dehydration: Secondary | ICD-10-CM | POA: Diagnosis not present

## 2017-01-16 LAB — BASIC METABOLIC PANEL
BUN: 24 — AB (ref 4–21)
Creatinine: 1.1 (ref 0.6–1.3)
Glucose: 84
Potassium: 4.1 (ref 3.4–5.3)
Sodium: 140 (ref 137–147)

## 2017-01-22 DIAGNOSIS — N183 Chronic kidney disease, stage 3 (moderate): Secondary | ICD-10-CM | POA: Diagnosis not present

## 2017-01-22 DIAGNOSIS — I679 Cerebrovascular disease, unspecified: Secondary | ICD-10-CM | POA: Diagnosis not present

## 2017-01-22 DIAGNOSIS — I509 Heart failure, unspecified: Secondary | ICD-10-CM | POA: Diagnosis not present

## 2017-01-22 DIAGNOSIS — I25810 Atherosclerosis of coronary artery bypass graft(s) without angina pectoris: Secondary | ICD-10-CM | POA: Diagnosis not present

## 2017-01-22 DIAGNOSIS — G309 Alzheimer's disease, unspecified: Secondary | ICD-10-CM | POA: Diagnosis not present

## 2017-01-22 DIAGNOSIS — I4891 Unspecified atrial fibrillation: Secondary | ICD-10-CM | POA: Diagnosis not present

## 2017-01-23 DIAGNOSIS — D649 Anemia, unspecified: Secondary | ICD-10-CM | POA: Diagnosis not present

## 2017-01-23 DIAGNOSIS — E860 Dehydration: Secondary | ICD-10-CM | POA: Diagnosis not present

## 2017-01-23 LAB — BASIC METABOLIC PANEL
BUN: 21 (ref 4–21)
Creatinine: 1 (ref 0.6–1.3)
Glucose: 98
Potassium: 4.1 (ref 3.4–5.3)
Sodium: 138 (ref 137–147)

## 2017-01-28 ENCOUNTER — Non-Acute Institutional Stay (SKILLED_NURSING_FACILITY): Payer: Medicare Other | Admitting: Internal Medicine

## 2017-01-28 ENCOUNTER — Encounter: Payer: Self-pay | Admitting: Internal Medicine

## 2017-01-28 DIAGNOSIS — I480 Paroxysmal atrial fibrillation: Secondary | ICD-10-CM

## 2017-01-28 DIAGNOSIS — F0281 Dementia in other diseases classified elsewhere with behavioral disturbance: Secondary | ICD-10-CM

## 2017-01-28 DIAGNOSIS — E43 Unspecified severe protein-calorie malnutrition: Secondary | ICD-10-CM

## 2017-01-28 DIAGNOSIS — I5032 Chronic diastolic (congestive) heart failure: Secondary | ICD-10-CM | POA: Diagnosis not present

## 2017-01-28 DIAGNOSIS — G301 Alzheimer's disease with late onset: Secondary | ICD-10-CM

## 2017-01-28 DIAGNOSIS — F02818 Dementia in other diseases classified elsewhere, unspecified severity, with other behavioral disturbance: Secondary | ICD-10-CM

## 2017-01-28 DIAGNOSIS — N183 Chronic kidney disease, stage 3 unspecified: Secondary | ICD-10-CM

## 2017-01-28 DIAGNOSIS — R627 Adult failure to thrive: Secondary | ICD-10-CM

## 2017-01-28 NOTE — Progress Notes (Signed)
Patient ID: Bryan [name], male   DOB: [birth year], 83 y.o.   MRN: [id number]  Location:  [facility] Room Number: 413 memory care Place of Service:  SNF [phone]) Provider:   [name], DO  Patient Care Team: [name], DO as PCP - General (Geriatric Medicine) Community, [facility] Edgar Frisk, NP as Nurse Practitioner (Nurse Practitioner)  Extended Emergency Contact Information Primary Emergency Contact: [name] Address: [address]          [place], [place] [address] [place] Phone: [phone] Relation: Spouse Secondary Emergency Contact: [name], [place] Phone: [phone] Relation: Daughter  Code Status:  DNR Goals of care: Advanced Directive information Advanced Directives 01/28/2017  Does Patient Have a Medical Advance Directive? Yes  Type of Advance Directive Out of facility DNR (pink MOST or yellow form);Living will;Healthcare Power of Attorney  Does patient want to make changes to medical advance directive? -  Copy of [place] in Chart? Yes  Pre-existing out of facility DNR order (yellow form or pink MOST form) Yellow form placed in chart (order not valid for inpatient use);Pink MOST form placed in chart (order not valid for inpatient use)     Chief Complaint  Patient presents with  . Medical Management of Chronic Issues    Routine Visit    HPI:  Pt is a 83 y.o. male seen today for medical management of chronic diseases.  He has end stage dementia in memory care, severe protein calorie malnutrition, failure to thrive, diastolic chf, afib.    Bryan [name] unfortunately does continue to decline.  He's been losing weight and most days does not eat well at all.  He has a rare day with adequate hydration.  He was down 2 more lbs yesterday.  Intake was very poor over the weekend.    When I saw him, he  cursed "what the hell" when I was listening to his heart and lungs.    He'd had some abdominal distention but this had resolved when I saw him.    He continues to get weekly bmps done at his son's request and cr fluctuates slightly one way or another depending on his intake and ouptut.  He is finally off of leg circumference measurements.    His volume status fluctuates with his intake also.  Diuretics are frequently adjusted with bmp readings and weights.    He was admitted to hospice due to his progressive decline, weight loss, poor intake.    Past Medical History:  Diagnosis Date  . Allergic rhinitis   . Alzheimer's disease 2013  . Atrial fibrillation ([place])    Long term anti coagulation w/ warfarin fro stroke risk reduction  . BPH (benign prostatic hyperplasia)   . Chronic venous insufficiency 01/12/2013  . Dementia with behavioral disturbance 12/29/13  . Gait disorder   . GERD (gastroesophageal reflux disease)   . History of CVA (cerebrovascular accident) 2001   Rt.MCA  . Hyperlipidemia   . Lactose intolerance   . Long term (current) use of anticoagulants 11/23/2012   Long-term anticoagulation with warfarin for stroke risk reduction related to AF   Past Surgical History:  Procedure Laterality Date  . CATARACT EXTRACTION W/ INTRAOCULAR LENS  IMPLANT, BILATERAL    . HERNIA REPAIR Right 1970s  . KNEE SURGERY Right 2000  . MASS EXCISION Left 07/30/2013   Procedure:  EXCISION OF BASAL CELL CARCINOMA  LEFT FOREHEAD ;  Surgeon: Irene Limbo, MD;  Location: [facility];  Service: Plastics;  Laterality: Left;  . RETINAL DETACHMENT SURGERY Right 2009    Allergies  Allergen Reactions  . Penicillins Rash  . Bee Venom   . Risperidone And Related     Unstable gait    Outpatient Encounter Prescriptions as of 01/28/2017  Medication Sig  . apixaban (ELIQUIS) 2.5 MG TABS tablet Take 2.5 mg by mouth 2 (two) times daily.  . bisacodyl (DULCOLAX) 10 MG suppository Place 10  mg rectally every other day.  . ipratropium-albuterol (DUONEB) 0.5-2.5 (3) MG/3ML SOLN Take 3 mLs by nebulization 3 (three) times daily. q 6 hrs prn  . lactose free nutrition (BOOST) LIQD Take 237 mLs by mouth 2 (two) times daily between meals.  . nitroGLYCERIN (NITROSTAT) 0.4 MG SL tablet Place 0.4 mg under the tongue every 5 (five) minutes as needed for chest pain.  . polyethylene glycol (MIRALAX / GLYCOLAX) packet Take 17 g by mouth daily.  . potassium chloride SA (K-DUR,KLOR-CON) 20 MEQ tablet Take 40 mEq by mouth daily.  . [DISCONTINUED] ciclopirox (LOPROX) 0.77 % cream Apply topically 2 (two) times daily as needed (redness).  . [DISCONTINUED] tamsulosin (FLOMAX) 0.4 MG CAPS capsule Take 0.4 mg by mouth at bedtime. Hold for SBP < 95   No facility-administered encounter medications on file as of 01/28/2017.     Review of Systems  Reason unable to perform ROS: per nursing, pt minimally verbal.  Constitutional: Positive for appetite change and fatigue. Negative for activity change and unexpected weight change.  Eyes: Negative for visual disturbance.  Respiratory: Negative for chest tightness and shortness of breath.   Cardiovascular: Positive for leg swelling. Negative for chest pain.  Gastrointestinal: Negative for abdominal distention, abdominal pain, constipation, diarrhea and nausea.  Genitourinary: Negative for dysuria.  Musculoskeletal: Positive for gait problem.  Neurological: Positive for weakness.  Psychiatric/Behavioral: Positive for confusion.    Immunization History  Administered Date(s) Administered  . Influenza Inj Mdck Quad Pf 04/25/2016  . Influenza Whole 04/20/2013  . Influenza-Unspecified 04/12/2014, 04/20/2015  . PPD Test 11/29/2012  . Pneumococcal Conjugate-13 08/01/2016   Pertinent  Health Maintenance Due  Topic Date Due  . INFLUENZA VACCINE  02/05/2017  . PNA vac Low Risk Adult (2 of 2 - PPSV23) 08/01/2017   Fall Risk  06/21/2016  Falls in the past year?  No   Functional Status Survey:  dependent in all adls for years now  Vitals:   01/28/17 1344  BP: 111/72  Pulse: 68  Resp: 20  Temp: (!) 97.5 F (36.4 C)  TempSrc: Oral  SpO2: 95%  Weight: 177 lb (80.3 kg)   Body mass index is 24.01 kg/m. Physical Exam  Constitutional: No distress.  Cardiovascular:  irreg irreg  Pulmonary/Chest: Effort normal and breath sounds normal. He has no rales.  Abdominal: Soft. Bowel sounds are normal. He exhibits no distension. There is no tenderness. There is no rebound and no guarding.  Musculoskeletal:  Contractures of LEs due to end stage dementia; legs elevated with bunny boots and pillows to protect heels  Neurological: He is alert.  Skin: Skin is warm and dry.  Psychiatric:  Agitated today with exam    Labs reviewed:  Recent Labs  04/19/16 0309  04/27/16 0110 04/27/16 0128 04/27/16 1020 04/28/16 0226  01/09/17 0600 01/16/17 0749 01/23/17 0600  NA 142  < > 141 145  --  142  < >  139 140 138  K 3.6  < > 3.6 3.9  --  2.9*  < > 3.7 4.1 4.1  CL 111  --  115* 115*  --  111  --   --   --   --   CO2 23  --  20*  --   --  24  --   --   --   --   GLUCOSE 114*  --  122* 112*  --  105*  --   --   --   --   BUN 25*  < > 22* 31*  --  19  < > 24* 24* 21  CREATININE 1.18  < > 1.11 1.10  --  1.28*  < > 1.0 1.1 1.0  CALCIUM 8.4*  --  8.3*  --   --  8.4*  --   --   --   --   MG  --   --   --   --  2.0  --   --   --   --   --   < > = values in this interval not displayed.  Recent Labs  04/19/16 0309 04/27/16 0110 04/28/16 0226  AST 17 14* 17  ALT 13* 10* 10*  ALKPHOS 75 78 81  BILITOT 0.3 0.7 1.2  PROT 6.7 6.3* 6.3*  ALBUMIN 2.7* 2.6* 2.6*    Recent Labs  02/20/16 2355  04/19/16 0309 04/27/16 0110  04/28/16 0226 07/06/16 09/30/16 0630 11/26/16  WBC 10.1  < > 8.6 9.7  --  8.6 7.6 6.7 7.6  NEUTROABS 7.3  --  5.5 6.8  --   --   --   --   --   HGB 13.0  < > 12.4* 11.8*  < > 12.0* 12.9* 12.4* 12.5*  HCT 40.5  < > 38.5* 36.8*  < >  37.2* 39* 38* 37*  MCV 94.6  --  92.3 92.7  --  91.4  --   --   --   PLT 214  < > 313 332  --  346 206 203 155  < > = values in this interval not displayed. Lab Results  Component Value Date   TSH 5.05 08/08/2016   No results found for: HGBA1C Lab Results  Component Value Date   CHOL 155 08/31/2015   HDL 33 (A) 08/31/2015   LDLCALC 99 08/31/2015   TRIG 116 08/31/2015   Assessment/Plan 1. Severe protein-calorie malnutrition ([place]) -due to worsening intake for past several months but more consistently poor recently -cont staff encouraging po, but do not push the issue if resident not hungry -would stop monitoring I/Os so closely if family approves as goals are moving to comfort with hospice care now  2. Chronic diastolic congestive heart failure ([place]) -would advise stopping weekly bmps and I/O monitoring and focus on treating dyspnea or pain if they develop using O2 and morphine  3. Paroxysmal atrial fibrillation (HCC) -stable, no changes needed  4. FTT (failure to thrive) in adult -ongoing, cont supportive care with hospice  5. Late onset Alzheimer's disease with behavioral disturbance -cont hospice care with goal of comfort, did d/c some orders for very detailed monitoring of specific symptoms today given changing goals   6. CKD (chronic kidney disease) stage 3, GFR 30-59 ml/min -avoid frequent labs and focus on pt's appearance, comfort  Family/ staff Communication: discussed with memory care nurse  Labs/tests ordered:  None, but still has weekly bmps, hospice nurse discussing more  with family  Verlyn Lambert L. Clarice Zulauf, D.O. [facility] [address], [place] [address] Cell Phone (Mon-Fri 8am-5pm):  [phone] On Call:  [phone] & follow prompts after 5pm & weekends Office Phone:  [phone] Office Fax:  [phone]

## 2017-01-30 DIAGNOSIS — I5032 Chronic diastolic (congestive) heart failure: Secondary | ICD-10-CM | POA: Diagnosis not present

## 2017-01-30 DIAGNOSIS — G309 Alzheimer's disease, unspecified: Secondary | ICD-10-CM | POA: Diagnosis not present

## 2017-01-30 DIAGNOSIS — I509 Heart failure, unspecified: Secondary | ICD-10-CM | POA: Diagnosis not present

## 2017-01-30 DIAGNOSIS — N183 Chronic kidney disease, stage 3 (moderate): Secondary | ICD-10-CM | POA: Diagnosis not present

## 2017-01-30 DIAGNOSIS — D649 Anemia, unspecified: Secondary | ICD-10-CM | POA: Diagnosis not present

## 2017-01-30 DIAGNOSIS — I679 Cerebrovascular disease, unspecified: Secondary | ICD-10-CM | POA: Diagnosis not present

## 2017-01-30 DIAGNOSIS — I25810 Atherosclerosis of coronary artery bypass graft(s) without angina pectoris: Secondary | ICD-10-CM | POA: Diagnosis not present

## 2017-01-30 DIAGNOSIS — I4891 Unspecified atrial fibrillation: Secondary | ICD-10-CM | POA: Diagnosis not present

## 2017-02-05 DIAGNOSIS — I4891 Unspecified atrial fibrillation: Secondary | ICD-10-CM | POA: Diagnosis not present

## 2017-02-05 DIAGNOSIS — M2450 Contracture, unspecified joint: Secondary | ICD-10-CM | POA: Diagnosis not present

## 2017-02-05 DIAGNOSIS — G309 Alzheimer's disease, unspecified: Secondary | ICD-10-CM | POA: Diagnosis not present

## 2017-02-05 DIAGNOSIS — I25810 Atherosclerosis of coronary artery bypass graft(s) without angina pectoris: Secondary | ICD-10-CM | POA: Diagnosis not present

## 2017-02-05 DIAGNOSIS — N401 Enlarged prostate with lower urinary tract symptoms: Secondary | ICD-10-CM | POA: Diagnosis not present

## 2017-02-05 DIAGNOSIS — N183 Chronic kidney disease, stage 3 (moderate): Secondary | ICD-10-CM | POA: Diagnosis not present

## 2017-02-05 DIAGNOSIS — K219 Gastro-esophageal reflux disease without esophagitis: Secondary | ICD-10-CM | POA: Diagnosis not present

## 2017-02-05 DIAGNOSIS — I509 Heart failure, unspecified: Secondary | ICD-10-CM | POA: Diagnosis not present

## 2017-02-05 DIAGNOSIS — E785 Hyperlipidemia, unspecified: Secondary | ICD-10-CM | POA: Diagnosis not present

## 2017-02-05 DIAGNOSIS — I679 Cerebrovascular disease, unspecified: Secondary | ICD-10-CM | POA: Diagnosis not present

## 2017-02-05 DIAGNOSIS — R1310 Dysphagia, unspecified: Secondary | ICD-10-CM | POA: Diagnosis not present

## 2017-02-06 DIAGNOSIS — D649 Anemia, unspecified: Secondary | ICD-10-CM | POA: Diagnosis not present

## 2017-02-06 DIAGNOSIS — Z79899 Other long term (current) drug therapy: Secondary | ICD-10-CM | POA: Diagnosis not present

## 2017-02-07 DIAGNOSIS — N183 Chronic kidney disease, stage 3 (moderate): Secondary | ICD-10-CM | POA: Diagnosis not present

## 2017-02-07 DIAGNOSIS — I25810 Atherosclerosis of coronary artery bypass graft(s) without angina pectoris: Secondary | ICD-10-CM | POA: Diagnosis not present

## 2017-02-07 DIAGNOSIS — I4891 Unspecified atrial fibrillation: Secondary | ICD-10-CM | POA: Diagnosis not present

## 2017-02-07 DIAGNOSIS — G309 Alzheimer's disease, unspecified: Secondary | ICD-10-CM | POA: Diagnosis not present

## 2017-02-07 DIAGNOSIS — I679 Cerebrovascular disease, unspecified: Secondary | ICD-10-CM | POA: Diagnosis not present

## 2017-02-07 DIAGNOSIS — I509 Heart failure, unspecified: Secondary | ICD-10-CM | POA: Diagnosis not present

## 2017-02-12 DIAGNOSIS — I25810 Atherosclerosis of coronary artery bypass graft(s) without angina pectoris: Secondary | ICD-10-CM | POA: Diagnosis not present

## 2017-02-12 DIAGNOSIS — N183 Chronic kidney disease, stage 3 (moderate): Secondary | ICD-10-CM | POA: Diagnosis not present

## 2017-02-12 DIAGNOSIS — I4891 Unspecified atrial fibrillation: Secondary | ICD-10-CM | POA: Diagnosis not present

## 2017-02-12 DIAGNOSIS — G309 Alzheimer's disease, unspecified: Secondary | ICD-10-CM | POA: Diagnosis not present

## 2017-02-12 DIAGNOSIS — I509 Heart failure, unspecified: Secondary | ICD-10-CM | POA: Diagnosis not present

## 2017-02-12 DIAGNOSIS — I679 Cerebrovascular disease, unspecified: Secondary | ICD-10-CM | POA: Diagnosis not present

## 2017-02-13 DIAGNOSIS — D649 Anemia, unspecified: Secondary | ICD-10-CM | POA: Diagnosis not present

## 2017-02-13 DIAGNOSIS — I5032 Chronic diastolic (congestive) heart failure: Secondary | ICD-10-CM | POA: Diagnosis not present

## 2017-02-14 DIAGNOSIS — I679 Cerebrovascular disease, unspecified: Secondary | ICD-10-CM | POA: Diagnosis not present

## 2017-02-14 DIAGNOSIS — I25810 Atherosclerosis of coronary artery bypass graft(s) without angina pectoris: Secondary | ICD-10-CM | POA: Diagnosis not present

## 2017-02-14 DIAGNOSIS — I509 Heart failure, unspecified: Secondary | ICD-10-CM | POA: Diagnosis not present

## 2017-02-14 DIAGNOSIS — N183 Chronic kidney disease, stage 3 (moderate): Secondary | ICD-10-CM | POA: Diagnosis not present

## 2017-02-14 DIAGNOSIS — I4891 Unspecified atrial fibrillation: Secondary | ICD-10-CM | POA: Diagnosis not present

## 2017-02-14 DIAGNOSIS — G309 Alzheimer's disease, unspecified: Secondary | ICD-10-CM | POA: Diagnosis not present

## 2017-02-20 DIAGNOSIS — G309 Alzheimer's disease, unspecified: Secondary | ICD-10-CM | POA: Diagnosis not present

## 2017-02-20 DIAGNOSIS — I25810 Atherosclerosis of coronary artery bypass graft(s) without angina pectoris: Secondary | ICD-10-CM | POA: Diagnosis not present

## 2017-02-20 DIAGNOSIS — D649 Anemia, unspecified: Secondary | ICD-10-CM | POA: Diagnosis not present

## 2017-02-20 DIAGNOSIS — I679 Cerebrovascular disease, unspecified: Secondary | ICD-10-CM | POA: Diagnosis not present

## 2017-02-20 DIAGNOSIS — I5032 Chronic diastolic (congestive) heart failure: Secondary | ICD-10-CM | POA: Diagnosis not present

## 2017-02-20 DIAGNOSIS — N183 Chronic kidney disease, stage 3 (moderate): Secondary | ICD-10-CM | POA: Diagnosis not present

## 2017-02-20 DIAGNOSIS — I4891 Unspecified atrial fibrillation: Secondary | ICD-10-CM | POA: Diagnosis not present

## 2017-02-20 DIAGNOSIS — I509 Heart failure, unspecified: Secondary | ICD-10-CM | POA: Diagnosis not present

## 2017-02-27 DIAGNOSIS — I509 Heart failure, unspecified: Secondary | ICD-10-CM | POA: Diagnosis not present

## 2017-02-27 DIAGNOSIS — I4891 Unspecified atrial fibrillation: Secondary | ICD-10-CM | POA: Diagnosis not present

## 2017-02-27 DIAGNOSIS — D649 Anemia, unspecified: Secondary | ICD-10-CM | POA: Diagnosis not present

## 2017-02-27 DIAGNOSIS — N183 Chronic kidney disease, stage 3 (moderate): Secondary | ICD-10-CM | POA: Diagnosis not present

## 2017-02-27 DIAGNOSIS — I679 Cerebrovascular disease, unspecified: Secondary | ICD-10-CM | POA: Diagnosis not present

## 2017-02-27 DIAGNOSIS — I5032 Chronic diastolic (congestive) heart failure: Secondary | ICD-10-CM | POA: Diagnosis not present

## 2017-02-27 DIAGNOSIS — G309 Alzheimer's disease, unspecified: Secondary | ICD-10-CM | POA: Diagnosis not present

## 2017-02-27 DIAGNOSIS — I25810 Atherosclerosis of coronary artery bypass graft(s) without angina pectoris: Secondary | ICD-10-CM | POA: Diagnosis not present

## 2017-03-03 DIAGNOSIS — I4891 Unspecified atrial fibrillation: Secondary | ICD-10-CM | POA: Diagnosis not present

## 2017-03-03 DIAGNOSIS — I25810 Atherosclerosis of coronary artery bypass graft(s) without angina pectoris: Secondary | ICD-10-CM | POA: Diagnosis not present

## 2017-03-03 DIAGNOSIS — G309 Alzheimer's disease, unspecified: Secondary | ICD-10-CM | POA: Diagnosis not present

## 2017-03-03 DIAGNOSIS — N183 Chronic kidney disease, stage 3 (moderate): Secondary | ICD-10-CM | POA: Diagnosis not present

## 2017-03-03 DIAGNOSIS — I679 Cerebrovascular disease, unspecified: Secondary | ICD-10-CM | POA: Diagnosis not present

## 2017-03-03 DIAGNOSIS — I509 Heart failure, unspecified: Secondary | ICD-10-CM | POA: Diagnosis not present

## 2017-03-04 DIAGNOSIS — B351 Tinea unguium: Secondary | ICD-10-CM | POA: Diagnosis not present

## 2017-03-06 DIAGNOSIS — D649 Anemia, unspecified: Secondary | ICD-10-CM | POA: Diagnosis not present

## 2017-03-06 DIAGNOSIS — I679 Cerebrovascular disease, unspecified: Secondary | ICD-10-CM | POA: Diagnosis not present

## 2017-03-06 DIAGNOSIS — I4891 Unspecified atrial fibrillation: Secondary | ICD-10-CM | POA: Diagnosis not present

## 2017-03-06 DIAGNOSIS — I509 Heart failure, unspecified: Secondary | ICD-10-CM | POA: Diagnosis not present

## 2017-03-06 DIAGNOSIS — I25810 Atherosclerosis of coronary artery bypass graft(s) without angina pectoris: Secondary | ICD-10-CM | POA: Diagnosis not present

## 2017-03-06 DIAGNOSIS — G309 Alzheimer's disease, unspecified: Secondary | ICD-10-CM | POA: Diagnosis not present

## 2017-03-06 DIAGNOSIS — N183 Chronic kidney disease, stage 3 (moderate): Secondary | ICD-10-CM | POA: Diagnosis not present

## 2017-03-06 DIAGNOSIS — I5020 Unspecified systolic (congestive) heart failure: Secondary | ICD-10-CM | POA: Diagnosis not present

## 2017-03-08 DIAGNOSIS — M2450 Contracture, unspecified joint: Secondary | ICD-10-CM | POA: Diagnosis not present

## 2017-03-08 DIAGNOSIS — I509 Heart failure, unspecified: Secondary | ICD-10-CM | POA: Diagnosis not present

## 2017-03-08 DIAGNOSIS — G309 Alzheimer's disease, unspecified: Secondary | ICD-10-CM | POA: Diagnosis not present

## 2017-03-08 DIAGNOSIS — I679 Cerebrovascular disease, unspecified: Secondary | ICD-10-CM | POA: Diagnosis not present

## 2017-03-08 DIAGNOSIS — N401 Enlarged prostate with lower urinary tract symptoms: Secondary | ICD-10-CM | POA: Diagnosis not present

## 2017-03-08 DIAGNOSIS — E785 Hyperlipidemia, unspecified: Secondary | ICD-10-CM | POA: Diagnosis not present

## 2017-03-08 DIAGNOSIS — I4891 Unspecified atrial fibrillation: Secondary | ICD-10-CM | POA: Diagnosis not present

## 2017-03-08 DIAGNOSIS — K219 Gastro-esophageal reflux disease without esophagitis: Secondary | ICD-10-CM | POA: Diagnosis not present

## 2017-03-08 DIAGNOSIS — R1310 Dysphagia, unspecified: Secondary | ICD-10-CM | POA: Diagnosis not present

## 2017-03-08 DIAGNOSIS — I25810 Atherosclerosis of coronary artery bypass graft(s) without angina pectoris: Secondary | ICD-10-CM | POA: Diagnosis not present

## 2017-03-08 DIAGNOSIS — N183 Chronic kidney disease, stage 3 (moderate): Secondary | ICD-10-CM | POA: Diagnosis not present

## 2017-03-13 DIAGNOSIS — I5020 Unspecified systolic (congestive) heart failure: Secondary | ICD-10-CM | POA: Diagnosis not present

## 2017-03-13 DIAGNOSIS — D649 Anemia, unspecified: Secondary | ICD-10-CM | POA: Diagnosis not present

## 2017-03-14 DIAGNOSIS — G309 Alzheimer's disease, unspecified: Secondary | ICD-10-CM | POA: Diagnosis not present

## 2017-03-14 DIAGNOSIS — I509 Heart failure, unspecified: Secondary | ICD-10-CM | POA: Diagnosis not present

## 2017-03-14 DIAGNOSIS — I4891 Unspecified atrial fibrillation: Secondary | ICD-10-CM | POA: Diagnosis not present

## 2017-03-14 DIAGNOSIS — I25810 Atherosclerosis of coronary artery bypass graft(s) without angina pectoris: Secondary | ICD-10-CM | POA: Diagnosis not present

## 2017-03-14 DIAGNOSIS — I679 Cerebrovascular disease, unspecified: Secondary | ICD-10-CM | POA: Diagnosis not present

## 2017-03-14 DIAGNOSIS — N183 Chronic kidney disease, stage 3 (moderate): Secondary | ICD-10-CM | POA: Diagnosis not present

## 2017-03-19 DIAGNOSIS — I25810 Atherosclerosis of coronary artery bypass graft(s) without angina pectoris: Secondary | ICD-10-CM | POA: Diagnosis not present

## 2017-03-19 DIAGNOSIS — I679 Cerebrovascular disease, unspecified: Secondary | ICD-10-CM | POA: Diagnosis not present

## 2017-03-19 DIAGNOSIS — I509 Heart failure, unspecified: Secondary | ICD-10-CM | POA: Diagnosis not present

## 2017-03-19 DIAGNOSIS — G309 Alzheimer's disease, unspecified: Secondary | ICD-10-CM | POA: Diagnosis not present

## 2017-03-19 DIAGNOSIS — I4891 Unspecified atrial fibrillation: Secondary | ICD-10-CM | POA: Diagnosis not present

## 2017-03-19 DIAGNOSIS — N183 Chronic kidney disease, stage 3 (moderate): Secondary | ICD-10-CM | POA: Diagnosis not present

## 2017-03-20 DIAGNOSIS — N183 Chronic kidney disease, stage 3 (moderate): Secondary | ICD-10-CM | POA: Diagnosis not present

## 2017-03-20 DIAGNOSIS — D649 Anemia, unspecified: Secondary | ICD-10-CM | POA: Diagnosis not present

## 2017-03-20 DIAGNOSIS — I509 Heart failure, unspecified: Secondary | ICD-10-CM | POA: Diagnosis not present

## 2017-03-20 DIAGNOSIS — G309 Alzheimer's disease, unspecified: Secondary | ICD-10-CM | POA: Diagnosis not present

## 2017-03-20 DIAGNOSIS — I25810 Atherosclerosis of coronary artery bypass graft(s) without angina pectoris: Secondary | ICD-10-CM | POA: Diagnosis not present

## 2017-03-20 DIAGNOSIS — I5020 Unspecified systolic (congestive) heart failure: Secondary | ICD-10-CM | POA: Diagnosis not present

## 2017-03-20 DIAGNOSIS — I4891 Unspecified atrial fibrillation: Secondary | ICD-10-CM | POA: Diagnosis not present

## 2017-03-20 DIAGNOSIS — I679 Cerebrovascular disease, unspecified: Secondary | ICD-10-CM | POA: Diagnosis not present

## 2017-03-20 LAB — BASIC METABOLIC PANEL
BUN: 25 — AB (ref 4–21)
CREATININE: 1.1 (ref 0.6–1.3)
Glucose: 100
POTASSIUM: 4 (ref 3.4–5.3)
SODIUM: 140 (ref 137–147)

## 2017-03-26 DIAGNOSIS — I25810 Atherosclerosis of coronary artery bypass graft(s) without angina pectoris: Secondary | ICD-10-CM | POA: Diagnosis not present

## 2017-03-26 DIAGNOSIS — N183 Chronic kidney disease, stage 3 (moderate): Secondary | ICD-10-CM | POA: Diagnosis not present

## 2017-03-26 DIAGNOSIS — I509 Heart failure, unspecified: Secondary | ICD-10-CM | POA: Diagnosis not present

## 2017-03-26 DIAGNOSIS — G309 Alzheimer's disease, unspecified: Secondary | ICD-10-CM | POA: Diagnosis not present

## 2017-03-26 DIAGNOSIS — I4891 Unspecified atrial fibrillation: Secondary | ICD-10-CM | POA: Diagnosis not present

## 2017-03-26 DIAGNOSIS — I679 Cerebrovascular disease, unspecified: Secondary | ICD-10-CM | POA: Diagnosis not present

## 2017-03-27 ENCOUNTER — Encounter: Payer: Self-pay | Admitting: Adult Health

## 2017-03-27 ENCOUNTER — Non-Acute Institutional Stay: Payer: Medicare Other | Admitting: Adult Health

## 2017-03-27 DIAGNOSIS — I482 Chronic atrial fibrillation, unspecified: Secondary | ICD-10-CM

## 2017-03-27 DIAGNOSIS — R634 Abnormal weight loss: Secondary | ICD-10-CM

## 2017-03-27 DIAGNOSIS — N183 Chronic kidney disease, stage 3 unspecified: Secondary | ICD-10-CM

## 2017-03-27 DIAGNOSIS — G301 Alzheimer's disease with late onset: Secondary | ICD-10-CM

## 2017-03-27 DIAGNOSIS — I5032 Chronic diastolic (congestive) heart failure: Secondary | ICD-10-CM

## 2017-03-27 DIAGNOSIS — E440 Moderate protein-calorie malnutrition: Secondary | ICD-10-CM

## 2017-03-27 DIAGNOSIS — F0281 Dementia in other diseases classified elsewhere with behavioral disturbance: Secondary | ICD-10-CM

## 2017-03-27 DIAGNOSIS — F02818 Dementia in other diseases classified elsewhere, unspecified severity, with other behavioral disturbance: Secondary | ICD-10-CM

## 2017-03-27 DIAGNOSIS — R339 Retention of urine, unspecified: Secondary | ICD-10-CM | POA: Diagnosis not present

## 2017-03-27 DIAGNOSIS — K5901 Slow transit constipation: Secondary | ICD-10-CM | POA: Diagnosis not present

## 2017-03-27 DIAGNOSIS — I5020 Unspecified systolic (congestive) heart failure: Secondary | ICD-10-CM | POA: Diagnosis not present

## 2017-03-27 DIAGNOSIS — D649 Anemia, unspecified: Secondary | ICD-10-CM | POA: Diagnosis not present

## 2017-03-27 DIAGNOSIS — R1310 Dysphagia, unspecified: Secondary | ICD-10-CM | POA: Diagnosis not present

## 2017-03-27 NOTE — Progress Notes (Signed)
Location:  Occup[facility] psychologist of Service:  ALF (13) Provider:   Cindi Carbon, ANP [facility] [phone]   [name], DO  Patient Care Team: [name], DO as PCP - General (Geriatric Medicine) Community, [facility] Angelique Holm, Margreta Journey, NP as Nurse Practitioner (Nurse Practitioner)  Extended Emergency Contact Information Primary Emergency Contact: [name] Address: [address]          [place], [place] [address] [place] Phone: [phone] Relation: Spouse Secondary Emergency Contact: [name], [place] Phone: [phone] Relation: Daughter  Code Status:  DNR Goals of care: Advanced Directive information Advanced Directives 03/27/2017  Does Patient Have a Medical Advance Directive? Yes  Type of Advance Directive Out of facility DNR (pink MOST or yellow form);Living will;Healthcare Power of Attorney  Does patient want to make changes to medical advance directive? -  Copy of [place] in Chart? Yes  Pre-existing out of facility DNR order (yellow form or pink MOST form) Yellow form placed in chart (order not valid for inpatient use);Pink MOST form placed in chart (order not valid for inpatient use)     Chief Complaint  Patient presents with  . Medical Management of Chronic Issues    HPI:  Pt is a 83 y.o. male seen today for medical management of chronic diseases.  He resides in the memory care unit and has caregivers 1:1 during the day. He is a [employer] fast [employer], incontinent, and requires assistance with all ADLs. He is followed by hospice for weight loss, dementia, dysphagia, and over all decline. He continues to have poor intake and weight loss. Output varies but overall poor.  Urinary retention: foley in place with clear urine, no fever or signs of discomfort Constipation: has issues with gaseous distention and  sometimes needs a rectal tube to relieve discomfort. Dulcolax suppositories have helped. Afib: on eliquis for CVA risk reduction CKDIII: receives BMPs weekly per his family's request to monitor for dehydration; hx of hydronephrosis on the left not treated due to age/debility Cerebrovascular dz: with hx of CVA Hypokalemia: takes kdur 40 meq qd Diastolic CHF: weight trending down, no symptoms Wt Readings from Last 3 Encounters:  03/27/17 176 lb 4.8 oz (80 kg)  01/28/17 177 lb (80.3 kg)  12/13/16 195 lb (88.5 kg)     Past Medical History:  Diagnosis Date  . Allergic rhinitis   . Alzheimer's disease 2013  . Atrial fibrillation ([place])    Long term anti coagulation w/ warfarin fro stroke risk reduction  . BPH (benign prostatic hyperplasia)   . Chronic venous insufficiency 01/12/2013  . Dementia with behavioral disturbance 12/29/13  . Gait disorder   . GERD (gastroesophageal reflux disease)   . History of CVA (cerebrovascular accident) 2001   Rt.MCA  . Hyperlipidemia   . Lactose intolerance   . Long term (current) use of anticoagulants 11/23/2012   Long-term anticoagulation with warfarin for stroke risk reduction related to AF   Past Surgical History:  Procedure Laterality Date  . CATARACT EXTRACTION W/ INTRAOCULAR LENS  IMPLANT, BILATERAL    . HERNIA REPAIR Right 1970s  . KNEE SURGERY Right 2000  . MASS EXCISION Left 07/30/2013   Procedure: EXCISION OF BASAL CELL CARCINOMA  LEFT FOREHEAD ;  Surgeon: Irene Limbo, MD;  Location: [facility];  Service: Plastics;  Laterality: Left;  . RETINAL DETACHMENT  SURGERY Right 2009    Allergies  Allergen Reactions  . Penicillins Rash  . Bee Venom   . Risperidone And Related     Unstable gait    Outpatient Encounter Prescriptions as of 03/27/2017  Medication Sig  . apixaban (ELIQUIS) 2.5 MG TABS tablet Take 2.5 mg by mouth 2 (two) times daily.  . bisacodyl (DULCOLAX) 10 MG suppository Place 10 mg rectally every other day.    . ipratropium-albuterol (DUONEB) 0.5-2.5 (3) MG/3ML SOLN Take 3 mLs by nebulization every 6 (six) hours as needed (for wheezing and cough). q 6 hrs prn  . lactose free nutrition (BOOST) LIQD Take 237 mLs by mouth 3 (three) times daily between meals.   . nitroGLYCERIN (NITROSTAT) 0.4 MG SL tablet Place 0.4 mg under the tongue every 5 (five) minutes as needed for chest pain.  . polyethylene glycol (MIRALAX / GLYCOLAX) packet Take 17 g by mouth daily.  . potassium chloride SA (K-DUR,KLOR-CON) 20 MEQ tablet Take 40 mEq by mouth daily.   No facility-administered encounter medications on file as of 03/27/2017.     Review of Systems  Unable to perform ROS: Dementia    Immunization History  Administered Date(s) Administered  . Influenza Inj Mdck Quad Pf 04/25/2016  . Influenza Whole 04/20/2013  . Influenza-Unspecified 04/12/2014, 04/20/2015  . PPD Test 11/29/2012  . Pneumococcal Conjugate-13 08/01/2016   Pertinent  Health Maintenance Due  Topic Date Due  . INFLUENZA VACCINE  02/05/2017  . PNA vac Low Risk Adult (2 of 2 - PPSV23) 08/01/2017   Fall Risk  06/21/2016  Falls in the past year? No   Functional Status Survey:    Vitals:   03/27/17 1600  BP: 130/80  Pulse: 90  Resp: 20  Temp: (!) 96.4 F (35.8 C)  SpO2: 98%  Weight: 176 lb 4.8 oz (80 kg)   Body mass index is 23.91 kg/m. Physical Exam  Constitutional: No distress.  HENT:  Head: Normocephalic and atraumatic.  Mouth/Throat: No oropharyngeal exudate.  Neck: Normal range of motion. Neck supple. No JVD present. No tracheal deviation present. No thyromegaly present.  Cardiovascular: Normal rate and regular rhythm.   No murmur heard. BLE edema +1  Pulmonary/Chest: Effort normal and breath sounds normal. No respiratory distress. He has no wheezes.  Abdominal: Soft. Bowel sounds are normal. He exhibits no distension. There is no tenderness.  Genitourinary:  Genitourinary Comments: Clear yellow urine in foley   Lymphadenopathy:    He has no cervical adenopathy.  Neurological: He is alert. No cranial nerve deficit.  No obvious focal deficit, not able to f/c  Skin: Skin is warm and dry. He is not diaphoretic.  Psychiatric:  Slight agitation with assessment    Labs reviewed:  Recent Labs  04/19/16 0309  04/27/16 0110 04/27/16 0128 04/27/16 1020 04/28/16 0226  01/16/17 0749 01/23/17 0600 03/20/17  NA 142  < > 141 145  --  142  < > 140 138 140  K 3.6  < > 3.6 3.9  --  2.9*  < > 4.1 4.1 4.0  CL 111  --  115* 115*  --  111  --   --   --   --   CO2 23  --  20*  --   --  24  --   --   --   --   GLUCOSE 114*  --  122* 112*  --  105*  --   --   --   --  BUN 25*  < > 22* 31*  --  19  < > 24* 21 25*  CREATININE 1.18  < > 1.11 1.10  --  1.28*  < > 1.1 1.0 1.1  CALCIUM 8.4*  --  8.3*  --   --  8.4*  --   --   --   --   MG  --   --   --   --  2.0  --   --   --   --   --   < > = values in this interval not displayed.  Recent Labs  04/19/16 0309 04/27/16 0110 04/28/16 0226  AST 17 14* 17  ALT 13* 10* 10*  ALKPHOS 75 78 81  BILITOT 0.3 0.7 1.2  PROT 6.7 6.3* 6.3*  ALBUMIN 2.7* 2.6* 2.6*    Recent Labs  04/19/16 0309 04/27/16 0110  04/28/16 0226 07/06/16 09/30/16 0630 11/26/16  WBC 8.6 9.7  --  8.6 7.6 6.7 7.6  NEUTROABS 5.5 6.8  --   --   --   --   --   HGB 12.4* 11.8*  < > 12.0* 12.9* 12.4* 12.5*  HCT 38.5* 36.8*  < > 37.2* 39* 38* 37*  MCV 92.3 92.7  --  91.4  --   --   --   PLT 313 332  --  346 206 203 155  < > = values in this interval not displayed. Lab Results  Component Value Date   TSH 5.05 08/08/2016   No results found for: HGBA1C Lab Results  Component Value Date   CHOL 155 08/31/2015   HDL 33 (A) 08/31/2015   LDLCALC 99 08/31/2015   TRIG 116 08/31/2015    Significant Diagnostic Results in last 30 days:  No results found.  Assessment/Plan  1. Chronic diastolic congestive heart failure (HCC) Continue monitoring weight and resp symptoms No currently on  diuretic therapy Would not benefit from ACE  2. Chronic atrial fibrillation (HCC) Rate controlled Continue Eliquis for CVA risk reduction   3. Slow transit constipation Continue miralax 17 grams qd and dulcolax supp qod May use rectal tube to relieve distention when necessary  4. Late onset Alzheimer's disease with behavioral disturbance Progressive cognitive and functional decline consistent with the dz Followed by hospice  5. CKD (chronic kidney disease) stage 3, GFR 30-59 ml/min Continue to periodically monitor BMP and avoid nephrotoxic agents  6. Urinary retention Foley catheter [facility] Due to [place]  7. Loss of weight Continue to lose weight despite boost TID and 1:1 caregivers   8. Moderate protein-calorie malnutrition (HCC) Continue boost TID, see above  9. Dysphagia, unspecified type Continue D1 diet with NTL Asp prec   Family/ staff Communication: discussed with staff  Labs/tests ordered:  BMP weekly (hospice to continue to work with his family to consider discontinuing frequent lab monitoring)

## 2017-04-02 DIAGNOSIS — G309 Alzheimer's disease, unspecified: Secondary | ICD-10-CM | POA: Diagnosis not present

## 2017-04-02 DIAGNOSIS — I25810 Atherosclerosis of coronary artery bypass graft(s) without angina pectoris: Secondary | ICD-10-CM | POA: Diagnosis not present

## 2017-04-02 DIAGNOSIS — I679 Cerebrovascular disease, unspecified: Secondary | ICD-10-CM | POA: Diagnosis not present

## 2017-04-02 DIAGNOSIS — N183 Chronic kidney disease, stage 3 (moderate): Secondary | ICD-10-CM | POA: Diagnosis not present

## 2017-04-02 DIAGNOSIS — I4891 Unspecified atrial fibrillation: Secondary | ICD-10-CM | POA: Diagnosis not present

## 2017-04-02 DIAGNOSIS — I509 Heart failure, unspecified: Secondary | ICD-10-CM | POA: Diagnosis not present

## 2017-04-03 DIAGNOSIS — I5020 Unspecified systolic (congestive) heart failure: Secondary | ICD-10-CM | POA: Diagnosis not present

## 2017-04-03 DIAGNOSIS — D649 Anemia, unspecified: Secondary | ICD-10-CM | POA: Diagnosis not present

## 2017-04-07 DIAGNOSIS — I509 Heart failure, unspecified: Secondary | ICD-10-CM | POA: Diagnosis not present

## 2017-04-07 DIAGNOSIS — N183 Chronic kidney disease, stage 3 (moderate): Secondary | ICD-10-CM | POA: Diagnosis not present

## 2017-04-07 DIAGNOSIS — I4891 Unspecified atrial fibrillation: Secondary | ICD-10-CM | POA: Diagnosis not present

## 2017-04-07 DIAGNOSIS — I25810 Atherosclerosis of coronary artery bypass graft(s) without angina pectoris: Secondary | ICD-10-CM | POA: Diagnosis not present

## 2017-04-07 DIAGNOSIS — E785 Hyperlipidemia, unspecified: Secondary | ICD-10-CM | POA: Diagnosis not present

## 2017-04-07 DIAGNOSIS — G309 Alzheimer's disease, unspecified: Secondary | ICD-10-CM | POA: Diagnosis not present

## 2017-04-07 DIAGNOSIS — I679 Cerebrovascular disease, unspecified: Secondary | ICD-10-CM | POA: Diagnosis not present

## 2017-04-07 DIAGNOSIS — N401 Enlarged prostate with lower urinary tract symptoms: Secondary | ICD-10-CM | POA: Diagnosis not present

## 2017-04-07 DIAGNOSIS — M2450 Contracture, unspecified joint: Secondary | ICD-10-CM | POA: Diagnosis not present

## 2017-04-07 DIAGNOSIS — K219 Gastro-esophageal reflux disease without esophagitis: Secondary | ICD-10-CM | POA: Diagnosis not present

## 2017-04-07 DIAGNOSIS — R1310 Dysphagia, unspecified: Secondary | ICD-10-CM | POA: Diagnosis not present

## 2017-04-08 DIAGNOSIS — N183 Chronic kidney disease, stage 3 (moderate): Secondary | ICD-10-CM | POA: Diagnosis not present

## 2017-04-08 DIAGNOSIS — G309 Alzheimer's disease, unspecified: Secondary | ICD-10-CM | POA: Diagnosis not present

## 2017-04-08 DIAGNOSIS — I509 Heart failure, unspecified: Secondary | ICD-10-CM | POA: Diagnosis not present

## 2017-04-08 DIAGNOSIS — I25810 Atherosclerosis of coronary artery bypass graft(s) without angina pectoris: Secondary | ICD-10-CM | POA: Diagnosis not present

## 2017-04-08 DIAGNOSIS — I679 Cerebrovascular disease, unspecified: Secondary | ICD-10-CM | POA: Diagnosis not present

## 2017-04-08 DIAGNOSIS — I4891 Unspecified atrial fibrillation: Secondary | ICD-10-CM | POA: Diagnosis not present

## 2017-04-10 DIAGNOSIS — E860 Dehydration: Secondary | ICD-10-CM | POA: Diagnosis not present

## 2017-04-10 DIAGNOSIS — D649 Anemia, unspecified: Secondary | ICD-10-CM | POA: Diagnosis not present

## 2017-04-10 LAB — BASIC METABOLIC PANEL
BUN: 27 — AB (ref 4–21)
Creatinine: 1.1 (ref 0.6–1.3)
Glucose: 96
Potassium: 4.2 (ref 3.4–5.3)
Sodium: 142 (ref 137–147)

## 2017-04-14 ENCOUNTER — Encounter: Payer: Self-pay | Admitting: Adult Health

## 2017-04-14 ENCOUNTER — Non-Acute Institutional Stay: Payer: Medicare Other | Admitting: Adult Health

## 2017-04-14 DIAGNOSIS — K5901 Slow transit constipation: Secondary | ICD-10-CM

## 2017-04-14 DIAGNOSIS — I482 Chronic atrial fibrillation: Secondary | ICD-10-CM | POA: Diagnosis not present

## 2017-04-14 DIAGNOSIS — I4821 Permanent atrial fibrillation: Secondary | ICD-10-CM

## 2017-04-14 NOTE — Progress Notes (Signed)
Location:  Occupational psychologist of Service:  ALF (13) Provider:   Cindi Carbon, ANP [facility] [phone]   [name], DO  Patient Care Team: [name], DO as PCP - General (Geriatric Medicine) Community, Well Angelique Holm, Margreta Journey, NP as Nurse Practitioner (Nurse Practitioner)  Extended Emergency Contact Information Primary Emergency Contact: [name] Address: [address]          [place], [place] [address] [place] Phone: [phone] Relation: Spouse Secondary Emergency Contact: [name], [place] Phone: [phone] Relation: Daughter  Code Status:  DNR Goals of care: Advanced Directive information Advanced Directives 03/27/2017  Does Patient Have a Medical Advance Directive? Yes  Type of Advance Directive Out of facility DNR (pink MOST or yellow form);Living will;Healthcare Power of Attorney  Does patient want to make changes to medical advance directive? -  Copy of [place] in Chart? Yes  Pre-existing out of facility DNR order (yellow form or pink MOST form) Yellow form placed in chart (order not valid for inpatient use);Pink MOST form placed in chart (order not valid for inpatient use)     Chief Complaint  Patient presents with  . Acute Visit    ? restart eliquis    HPI:  Pt is a 83 y.o. male seen today for an acute visit to evaluate for restarting eliquis. He is on eliquis for CVA risk reduction due to a hx of afib. He has severe dementia and is dependent on staff for all ADLs.   ON 10/2 the eliquis was discontinued due to blood noted around the stool. This had not been noted again. THe CNA described it as small amts around the stool. He now has runny brown stools and they tend to be large.  He is on miralax qd and dulcolax qod. He was having issues with abd distention and pain and had a dilated  colon on abd xray but the nurse reports he has not had needed a rectal tube (to release air) in quite some time.  He had an episodes of hematuria in his foley catheter on Friday 10/5 and "rust" colored urine on 10/7.  As of today his urine is clear and yellow without fever or other urinary symptoms.   12 lead ekgs in 2016 and 2017 document afib  Past Medical History:  Diagnosis Date  . Allergic rhinitis   . Alzheimer's disease 2013  . Atrial fibrillation ([place])    Long term anti coagulation w/ warfarin fro stroke risk reduction  . BPH (benign prostatic hyperplasia)   . Chronic venous insufficiency 01/12/2013  . Dementia with behavioral disturbance 12/29/13  . Gait disorder   . GERD (gastroesophageal reflux disease)   . History of CVA (cerebrovascular accident) 2001   Rt.MCA  . Hyperlipidemia   . Lactose intolerance   . Long term (current) use of anticoagulants 11/23/2012   Long-term anticoagulation with warfarin for stroke risk reduction related to AF   Past Surgical History:  Procedure Laterality Date  . CATARACT EXTRACTION W/ INTRAOCULAR LENS  IMPLANT, BILATERAL    . HERNIA REPAIR Right 1970s  . KNEE SURGERY Right 2000  . MASS EXCISION Left 07/30/2013   Procedure: EXCISION OF BASAL CELL CARCINOMA  LEFT FOREHEAD ;  Surgeon: Irene Limbo, MD;  Location: [facility];  Service: Plastics;  Laterality: Left;  . RETINAL DETACHMENT  SURGERY Right 2009    Allergies  Allergen Reactions  . Penicillins Rash  . Bee Venom   . Risperidone And Related     Unstable gait    Outpatient Encounter Prescriptions as of 04/14/2017  Medication Sig  . bisacodyl (DULCOLAX) 10 MG suppository Place 10 mg rectally every other day.  . ipratropium-albuterol (DUONEB) 0.5-2.5 (3) MG/3ML SOLN Take 3 mLs by nebulization every 6 (six) hours as needed (for wheezing and cough). q 6 hrs prn  . lactose free nutrition (BOOST) LIQD Take 237 mLs by mouth 3 (three) times daily between meals.   .  nitroGLYCERIN (NITROSTAT) 0.4 MG SL tablet Place 0.4 mg under the tongue every 5 (five) minutes as needed for chest pain.  . polyethylene glycol (MIRALAX / GLYCOLAX) packet Take 17 g by mouth daily.  . potassium chloride SA (K-DUR,KLOR-CON) 20 MEQ tablet Take 40 mEq by mouth daily.  . [DISCONTINUED] apixaban (ELIQUIS) 2.5 MG TABS tablet Take 2.5 mg by mouth 2 (two) times daily.   No facility-administered encounter medications on file as of 04/14/2017.     Review of Systems not able to complete due to dementia  Immunization History  Administered Date(s) Administered  . Influenza Inj Mdck Quad Pf 04/25/2016  . Influenza Whole 04/20/2013  . Influenza-Unspecified 04/12/2014, 04/20/2015  . PPD Test 11/29/2012  . Pneumococcal Conjugate-13 08/01/2016   Pertinent  Health Maintenance Due  Topic Date Due  . INFLUENZA VACCINE  02/05/2017  . PNA vac Low Risk Adult (2 of 2 - PPSV23) 08/01/2017   Fall Risk  06/21/2016  Falls in the past year? No   Functional Status Survey:    Vitals:   04/14/17 1410  BP: 102/67  Pulse: 78  Resp: (!) 22  Temp: (!) 97.3 F (36.3 C)  SpO2: 97%  Weight: 175 lb (79.4 kg)   Body mass index is 23.73 kg/m.   Physical Exam  Constitutional: No distress.  HENT:  Head: Normocephalic and atraumatic.  Eyes: Pupils are equal, round, and reactive to light.  Neck: No JVD present.  Cardiovascular: Normal rate and regular rhythm.   No murmur heard. Pulmonary/Chest: Effort normal and breath sounds normal.  Abdominal: Soft. Bowel sounds are normal. He exhibits no distension.  Neurological: He is alert.  Not oriented or able to follow commands. No obvious focal deficit.  Skin: Skin is warm and dry. He is not diaphoretic.  Psychiatric: He has a normal mood and affect.    Labs reviewed:  Recent Labs  04/19/16 0309  04/27/16 0110 04/27/16 0128 04/27/16 1020 04/28/16 0226  01/16/17 0749 01/23/17 0600 03/20/17  NA 142  < > 141 145  --  142  < > 140 138 140   K 3.6  < > 3.6 3.9  --  2.9*  < > 4.1 4.1 4.0  CL 111  --  115* 115*  --  111  --   --   --   --   CO2 23  --  20*  --   --  24  --   --   --   --   GLUCOSE 114*  --  122* 112*  --  105*  --   --   --   --   BUN 25*  < > 22* 31*  --  19  < > 24* 21 25*  CREATININE 1.18  < > 1.11 1.10  --  1.28*  < > 1.1 1.0 1.1  CALCIUM 8.4*  --  8.3*  --   --  8.4*  --   --   --   --   MG  --   --   --   --  2.0  --   --   --   --   --   < > = values in this interval not displayed.  Recent Labs  04/19/16 0309 04/27/16 0110 04/28/16 0226  AST 17 14* 17  ALT 13* 10* 10*  ALKPHOS 75 78 81  BILITOT 0.3 0.7 1.2  PROT 6.7 6.3* 6.3*  ALBUMIN 2.7* 2.6* 2.6*    Recent Labs  04/19/16 0309 04/27/16 0110  04/28/16 0226 07/06/16 09/30/16 0630 11/26/16  WBC 8.6 9.7  --  8.6 7.6 6.7 7.6  NEUTROABS 5.5 6.8  --   --   --   --   --   HGB 12.4* 11.8*  < > 12.0* 12.9* 12.4* 12.5*  HCT 38.5* 36.8*  < > 37.2* 39* 38* 37*  MCV 92.3 92.7  --  91.4  --   --   --   PLT 313 332  --  346 206 203 155  < > = values in this interval not displayed. Lab Results  Component Value Date   TSH 5.05 08/08/2016   No results found for: HGBA1C Lab Results  Component Value Date   CHOL 155 08/31/2015   HDL 33 (A) 08/31/2015   LDLCALC 99 08/31/2015   TRIG 116 08/31/2015    Significant Diagnostic Results in last 30 days:  No results found.  Assessment/Plan  1) Afib His rate remains controlled without medication The 12 lead ekg continues to show afib  I spoke with his son via email. He is a cardiologist. He would like the eliquis restarted.  I suggested a CBC due to the bleeding but he stated they would not want him to have a blood transfusion. His bleeding has stopped (rectal and urinary) and we will continue to monitor for these concerns. In this setting, I am not sure he benefits from eliquis given the severity of his dementia.   2) Constipation Decrease Miralax to 17 grams QOD due to frequent loose  stools Continue dulcolax QOD which has helped with abd distention  Family/ staff Communication: discussed with nurse  Labs/tests ordered:   EKG

## 2017-04-16 DIAGNOSIS — N183 Chronic kidney disease, stage 3 (moderate): Secondary | ICD-10-CM | POA: Diagnosis not present

## 2017-04-16 DIAGNOSIS — I509 Heart failure, unspecified: Secondary | ICD-10-CM | POA: Diagnosis not present

## 2017-04-16 DIAGNOSIS — I25810 Atherosclerosis of coronary artery bypass graft(s) without angina pectoris: Secondary | ICD-10-CM | POA: Diagnosis not present

## 2017-04-16 DIAGNOSIS — G309 Alzheimer's disease, unspecified: Secondary | ICD-10-CM | POA: Diagnosis not present

## 2017-04-16 DIAGNOSIS — I679 Cerebrovascular disease, unspecified: Secondary | ICD-10-CM | POA: Diagnosis not present

## 2017-04-16 DIAGNOSIS — I4891 Unspecified atrial fibrillation: Secondary | ICD-10-CM | POA: Diagnosis not present

## 2017-04-17 DIAGNOSIS — D649 Anemia, unspecified: Secondary | ICD-10-CM | POA: Diagnosis not present

## 2017-04-17 DIAGNOSIS — I4891 Unspecified atrial fibrillation: Secondary | ICD-10-CM | POA: Diagnosis not present

## 2017-04-17 DIAGNOSIS — I679 Cerebrovascular disease, unspecified: Secondary | ICD-10-CM | POA: Diagnosis not present

## 2017-04-17 DIAGNOSIS — I509 Heart failure, unspecified: Secondary | ICD-10-CM | POA: Diagnosis not present

## 2017-04-17 DIAGNOSIS — N183 Chronic kidney disease, stage 3 (moderate): Secondary | ICD-10-CM | POA: Diagnosis not present

## 2017-04-17 DIAGNOSIS — G309 Alzheimer's disease, unspecified: Secondary | ICD-10-CM | POA: Diagnosis not present

## 2017-04-17 DIAGNOSIS — I5020 Unspecified systolic (congestive) heart failure: Secondary | ICD-10-CM | POA: Diagnosis not present

## 2017-04-17 DIAGNOSIS — I25810 Atherosclerosis of coronary artery bypass graft(s) without angina pectoris: Secondary | ICD-10-CM | POA: Diagnosis not present

## 2017-04-17 LAB — BASIC METABOLIC PANEL
BUN: 28 — AB (ref 4–21)
Creatinine: 0.9 (ref 0.6–1.3)
Glucose: 89
Potassium: 4.5 (ref 3.4–5.3)
Sodium: 145 (ref 137–147)

## 2017-04-24 DIAGNOSIS — I509 Heart failure, unspecified: Secondary | ICD-10-CM | POA: Diagnosis not present

## 2017-04-24 DIAGNOSIS — I4891 Unspecified atrial fibrillation: Secondary | ICD-10-CM | POA: Diagnosis not present

## 2017-04-24 DIAGNOSIS — I679 Cerebrovascular disease, unspecified: Secondary | ICD-10-CM | POA: Diagnosis not present

## 2017-04-24 DIAGNOSIS — D649 Anemia, unspecified: Secondary | ICD-10-CM | POA: Diagnosis not present

## 2017-04-24 DIAGNOSIS — G309 Alzheimer's disease, unspecified: Secondary | ICD-10-CM | POA: Diagnosis not present

## 2017-04-24 DIAGNOSIS — I25810 Atherosclerosis of coronary artery bypass graft(s) without angina pectoris: Secondary | ICD-10-CM | POA: Diagnosis not present

## 2017-04-24 DIAGNOSIS — N183 Chronic kidney disease, stage 3 (moderate): Secondary | ICD-10-CM | POA: Diagnosis not present

## 2017-04-24 DIAGNOSIS — I5020 Unspecified systolic (congestive) heart failure: Secondary | ICD-10-CM | POA: Diagnosis not present

## 2017-04-24 LAB — BASIC METABOLIC PANEL
BUN: 26 — AB (ref 4–21)
Creatinine: 1 (ref 0.6–1.3)
Glucose: 94
Potassium: 4.2 (ref 3.4–5.3)
Sodium: 139 (ref 137–147)

## 2017-04-25 DIAGNOSIS — I509 Heart failure, unspecified: Secondary | ICD-10-CM | POA: Diagnosis not present

## 2017-04-25 DIAGNOSIS — I679 Cerebrovascular disease, unspecified: Secondary | ICD-10-CM | POA: Diagnosis not present

## 2017-04-25 DIAGNOSIS — I4891 Unspecified atrial fibrillation: Secondary | ICD-10-CM | POA: Diagnosis not present

## 2017-04-25 DIAGNOSIS — G309 Alzheimer's disease, unspecified: Secondary | ICD-10-CM | POA: Diagnosis not present

## 2017-04-25 DIAGNOSIS — N183 Chronic kidney disease, stage 3 (moderate): Secondary | ICD-10-CM | POA: Diagnosis not present

## 2017-04-25 DIAGNOSIS — I25810 Atherosclerosis of coronary artery bypass graft(s) without angina pectoris: Secondary | ICD-10-CM | POA: Diagnosis not present

## 2017-04-28 ENCOUNTER — Encounter: Payer: Self-pay | Admitting: Adult Health

## 2017-04-28 ENCOUNTER — Non-Acute Institutional Stay: Payer: Medicare Other | Admitting: Adult Health

## 2017-04-28 DIAGNOSIS — L0292 Furuncle, unspecified: Secondary | ICD-10-CM | POA: Diagnosis not present

## 2017-04-28 NOTE — Progress Notes (Signed)
Location:  Occupational psychologist of Service:  ALF (13) Provider:   Cindi Carbon, ANP [facility] [phone]   [name], DO  Patient Care Team: [name], DO as PCP - General (Geriatric Medicine) Community, Well Angelique Holm, Margreta Journey, NP as Nurse Practitioner (Nurse Practitioner)  Extended Emergency Contact Information Primary Emergency Contact: [name] Address: [address]          [place], [place] [address] [place] Phone: [phone] Relation: Spouse Secondary Emergency Contact: [name], [place] Phone: [phone] Relation: Daughter  Code Status: DNR Goals of care: Advanced Directive information Advanced Directives 03/27/2017  Does Patient Have a Medical Advance Directive? Yes  Type of Advance Directive Out of facility DNR (pink MOST or yellow form);Living will;Healthcare Power of Attorney  Does patient want to make changes to medical advance directive? -  Copy of [place] in Chart? Yes  Pre-existing out of facility DNR order (yellow form or pink MOST form) Yellow form placed in chart (order not valid for inpatient use);Pink MOST form placed in chart (order not valid for inpatient use)     Chief Complaint  Patient presents with  . Acute Visit    swollen area on neck    HPI:  Pt is a 83 y.o. male seen today for an acute visit for a swollen pustular nodule to the left side of his neck. The area has been increasing in size for the past few days. He has not appeared to be in pain or have a fever. He has advanced dementia and is not verbal and therefore unable to provide a hx.   Past Medical History:  Diagnosis Date  . Allergic rhinitis   . Alzheimer's disease 2013  . Atrial fibrillation ([facility])    Long term anti coagulation w/ warfarin fro stroke risk reduction  . BPH (benign prostatic hyperplasia)     . Chronic venous insufficiency 01/12/2013  . Dementia with behavioral disturbance 12/29/13  . Gait disorder   . GERD (gastroesophageal reflux disease)   . History of CVA (cerebrovascular accident) 2001   Rt.MCA  . Hyperlipidemia   . Lactose intolerance   . Long term (current) use of anticoagulants 11/23/2012   Long-term anticoagulation with warfarin for stroke risk reduction related to AF   Past Surgical History:  Procedure Laterality Date  . CATARACT EXTRACTION W/ INTRAOCULAR LENS  IMPLANT, BILATERAL    . HERNIA REPAIR Right 1970s  . KNEE SURGERY Right 2000  . MASS EXCISION Left 07/30/2013   Procedure: EXCISION OF BASAL CELL CARCINOMA  LEFT FOREHEAD ;  Surgeon: Irene Limbo, MD;  Location: [facility];  Service: Plastics;  Laterality: Left;  . RETINAL DETACHMENT SURGERY Right 2009    Allergies  Allergen Reactions  . Penicillins Rash  . Bee Venom   . Risperidone And Related     Unstable gait    Outpatient Encounter Prescriptions as of 04/28/2017  Medication Sig  . apixaban (ELIQUIS) 2.5 MG TABS tablet Take 2.5 mg by mouth 2 (two) times daily.  . bisacodyl (DULCOLAX) 10 MG suppository Place 10 mg rectally every other day.  . ipratropium-albuterol (DUONEB) 0.5-2.5 (3) MG/3ML SOLN Take 3 mLs by nebulization every 6 (six) hours as needed (for wheezing and cough). q 6 hrs prn  . lactose free nutrition (BOOST) LIQD Take 237 mLs by mouth 3 (  three) times daily between meals.   . nitroGLYCERIN (NITROSTAT) 0.4 MG SL tablet Place 0.4 mg under the tongue every 5 (five) minutes as needed for chest pain.  . polyethylene glycol (MIRALAX / GLYCOLAX) packet Take 17 g by mouth daily.  . potassium chloride SA (K-DUR,KLOR-CON) 20 MEQ tablet Take 40 mEq by mouth daily.   No facility-administered encounter medications on file as of 04/28/2017.     Review of Systems  Unable to perform ROS: Dementia    Immunization History  Administered Date(s) Administered  . Influenza Inj  Mdck Quad Pf 04/25/2016  . Influenza Whole 04/20/2013  . Influenza-Unspecified 04/12/2014, 04/20/2015  . PPD Test 11/29/2012  . Pneumococcal Conjugate-13 08/01/2016   Pertinent  Health Maintenance Due  Topic Date Due  . INFLUENZA VACCINE  02/05/2017  . PNA vac Low Risk Adult (2 of 2 - PPSV23) 08/01/2017   Fall Risk  06/21/2016  Falls in the past year? No   Functional Status Survey:    Vitals:   04/28/17 1617  BP: 122/68  Pulse: 74  Resp: 20  Temp: (!) 97 F (36.1 C)  SpO2: 93%  Weight: 175 lb (79.4 kg)   Body mass index is 23.73 kg/m. Physical Exam  Constitutional: No distress.  HENT:  Head: Normocephalic and atraumatic.  Neck: Normal range of motion. Neck supple. No JVD present. No tracheal deviation present. No thyromegaly present.  Abdominal: Soft. Bowel sounds are normal.  Lymphadenopathy:    He has no cervical adenopathy.  Neurological: He is alert.  Skin: Skin is warm and dry. He is not diaphoretic. There is erythema (left neck.  Large furuncle with pustular head and surrounding erythema noted. Does not appear to be tender to touch. No spreading erythema or adenopathy).  Psychiatric: He has a normal mood and affect.    Labs reviewed:  Recent Labs  01/16/17 0749 01/23/17 0600 03/20/17  NA 140 138 140  K 4.1 4.1 4.0  BUN 24* 21 25*  CREATININE 1.1 1.0 1.1   No results for input(s): AST, ALT, ALKPHOS, BILITOT, PROT, ALBUMIN in the last 8760 hours.  Recent Labs  07/06/16 09/30/16 0630 11/26/16  WBC 7.6 6.7 7.6  HGB 12.9* 12.4* 12.5*  HCT 39* 38* 37*  PLT 206 203 155   Lab Results  Component Value Date   TSH 5.05 08/08/2016   No results found for: HGBA1C Lab Results  Component Value Date   CHOL 155 08/31/2015   HDL 33 (A) 08/31/2015   LDLCALC 99 08/31/2015   TRIG 116 08/31/2015    Significant Diagnostic Results in last 30 days:  No results found.  Assessment/Plan  1. Furuncle He is resisting my exam and bending his neck down so that I  can not examine the area very well so he is not a great candidate for incision and drainage. The area has gotten larger through the day and so I have ordered warm compresses 15 min tid for 72 hrs and a warm shower in hopes that it will rupture on its own. I have asked the staff to notify us if it is no better so that we can intervene with an antibiotic. His goals of care are comfort based and he does not appear to be in pain at this time.     Family/ staff Communication: discussed with his nurse  Labs/tests ordered:  NA

## 2017-05-01 DIAGNOSIS — I4891 Unspecified atrial fibrillation: Secondary | ICD-10-CM | POA: Diagnosis not present

## 2017-05-01 DIAGNOSIS — G309 Alzheimer's disease, unspecified: Secondary | ICD-10-CM | POA: Diagnosis not present

## 2017-05-01 DIAGNOSIS — I509 Heart failure, unspecified: Secondary | ICD-10-CM | POA: Diagnosis not present

## 2017-05-01 DIAGNOSIS — I5020 Unspecified systolic (congestive) heart failure: Secondary | ICD-10-CM | POA: Diagnosis not present

## 2017-05-01 DIAGNOSIS — N183 Chronic kidney disease, stage 3 (moderate): Secondary | ICD-10-CM | POA: Diagnosis not present

## 2017-05-01 DIAGNOSIS — I25810 Atherosclerosis of coronary artery bypass graft(s) without angina pectoris: Secondary | ICD-10-CM | POA: Diagnosis not present

## 2017-05-01 DIAGNOSIS — I679 Cerebrovascular disease, unspecified: Secondary | ICD-10-CM | POA: Diagnosis not present

## 2017-05-01 DIAGNOSIS — D649 Anemia, unspecified: Secondary | ICD-10-CM | POA: Diagnosis not present

## 2017-05-01 DIAGNOSIS — Z23 Encounter for immunization: Secondary | ICD-10-CM | POA: Diagnosis not present

## 2017-05-06 ENCOUNTER — Encounter: Payer: Self-pay | Admitting: Internal Medicine

## 2017-05-06 ENCOUNTER — Non-Acute Institutional Stay: Payer: Medicare Other | Admitting: Internal Medicine

## 2017-05-06 DIAGNOSIS — I4891 Unspecified atrial fibrillation: Secondary | ICD-10-CM

## 2017-05-06 DIAGNOSIS — F0281 Dementia in other diseases classified elsewhere with behavioral disturbance: Secondary | ICD-10-CM

## 2017-05-06 DIAGNOSIS — G301 Alzheimer's disease with late onset: Secondary | ICD-10-CM

## 2017-05-06 DIAGNOSIS — D6869 Other thrombophilia: Secondary | ICD-10-CM

## 2017-05-06 DIAGNOSIS — F02818 Dementia in other diseases classified elsewhere, unspecified severity, with other behavioral disturbance: Secondary | ICD-10-CM

## 2017-05-06 DIAGNOSIS — L0292 Furuncle, unspecified: Secondary | ICD-10-CM

## 2017-05-06 DIAGNOSIS — I482 Chronic atrial fibrillation: Secondary | ICD-10-CM | POA: Diagnosis not present

## 2017-05-06 DIAGNOSIS — I4821 Permanent atrial fibrillation: Secondary | ICD-10-CM

## 2017-05-06 NOTE — Progress Notes (Signed)
Patient ID: Bryan [name], male   DOB: Jun 25, 1923, 83 y.o.   MRN: [id number]  Location:  [facility] Room Number: 850 memory care Place of Service:  ALF [phone]) Provider:   [name], DO  Patient Care Team: [name], DO as PCP - General (Geriatric Medicine) Community, Well Edgar Frisk, NP as Nurse Practitioner (Nurse Practitioner)  Extended Emergency Contact Information Primary Emergency Contact: [name] Address: [address]          [place], [place] [address] [place] Phone: [phone] Relation: Spouse Secondary Emergency Contact: [name], [place] Phone: [phone] Relation: Daughter  Code Status:  DNR, on hospice care now Goals of care: Advanced Directive information Advanced Directives 05/06/2017  Does Patient Have a Medical Advance Directive? Yes  Type of Advance Directive Out of facility DNR (pink MOST or yellow form);Living will;Healthcare Power of Attorney  Does patient want to make changes to medical advance directive? -  Copy of [place] in Chart? Yes  Pre-existing out of facility DNR order (yellow form or pink MOST form) Yellow form placed in chart (order not valid for inpatient use);Pink MOST form placed in chart (order not valid for inpatient use)   Chief Complaint  Patient presents with  . Acute Visit    neck furuncle    HPI:  Pt is a 83 y.o. male seen today for an acute visit for neck furuncle.  He has now completed his doxycycline therapy for the furuncle.  It was open and draining yesterday on its own.  Since then, his son approved I/D if needed.  When I saw patient, there was a scab over the area and it was not actively draining, redness and swelling had improved.  As I looked at it, [name] said more to me than he has in months.  "I recommend the conservative approach."  It was  garbled, but when I repeated what he said, he shook his head yes and smiled.  We also have been very concerned that this procedure would be uncomfortable for him and require numerous staff to keep him still.      Past Medical History:  Diagnosis Date  . Allergic rhinitis   . Alzheimer's disease 2013  . Atrial fibrillation ([facility])    Long term anti coagulation w/ warfarin fro stroke risk reduction  . BPH (benign prostatic hyperplasia)   . Chronic venous insufficiency 01/12/2013  . Dementia with behavioral disturbance 12/29/13  . Gait disorder   . GERD (gastroesophageal reflux disease)   . History of CVA (cerebrovascular accident) 2001   Rt.MCA  . Hyperlipidemia   . Lactose intolerance   . Long term (current) use of anticoagulants 11/23/2012   Long-term anticoagulation with warfarin for stroke risk reduction related to AF   Past Surgical History:  Procedure Laterality Date  . CATARACT EXTRACTION W/ INTRAOCULAR LENS  IMPLANT, BILATERAL    . HERNIA REPAIR Right 1970s  . KNEE SURGERY Right 2000  . MASS EXCISION Left 07/30/2013   Procedure: EXCISION OF BASAL CELL CARCINOMA  LEFT FOREHEAD ;  Surgeon: Irene Limbo, MD;  Location: [facility];  Service: Plastics;  Laterality: Left;  . RETINAL DETACHMENT SURGERY Right 2009    Allergies  Allergen Reactions  . Penicillins Rash  . Bee Venom   . Risperidone And Related     Unstable gait  Outpatient Encounter Prescriptions as of 05/06/2017  Medication Sig  . apixaban (ELIQUIS) 2.5 MG TABS tablet Take 2.5 mg by mouth 2 (two) times daily.  . bisacodyl (DULCOLAX) 10 MG suppository Place 10 mg rectally every other day.  Marland Kitchen doxycycline (VIBRAMYCIN) 100 MG capsule Take 100 mg by mouth 2 (two) times daily.  Marland Kitchen ipratropium-albuterol (DUONEB) 0.5-2.5 (3) MG/3ML SOLN Take 3 mLs by nebulization every 6 (six) hours as needed (for wheezing and cough). q 6 hrs prn  . lactose free nutrition (BOOST) LIQD Take 237 mLs by mouth 3 (three) times  daily between meals.   . nitroGLYCERIN (NITROSTAT) 0.4 MG SL tablet Place 0.4 mg under the tongue every 5 (five) minutes as needed for chest pain.  . polyethylene glycol (MIRALAX / GLYCOLAX) packet Take 17 g by mouth daily.  . potassium chloride SA (K-DUR,KLOR-CON) 20 MEQ tablet Take 40 mEq by mouth daily.   No facility-administered encounter medications on file as of 05/06/2017.     Review of Systems  Reason unable to perform ROS: as per nursing.  Constitutional: Negative for activity change, appetite change, chills and fever.  HENT: Negative for congestion.   Eyes: Negative for visual disturbance.  Respiratory: Negative for chest tightness and shortness of breath.   Cardiovascular: Positive for leg swelling. Negative for chest pain.  Gastrointestinal: Positive for constipation. Negative for blood in stool, diarrhea and nausea.  Genitourinary: Negative for dysuria.  Musculoskeletal: Positive for gait problem.  Neurological: Negative for dizziness and weakness.  Psychiatric/Behavioral: Positive for agitation, behavioral problems and confusion. Negative for sleep disturbance. The patient is not nervous/anxious.        During bathing/dressing    Immunization History  Administered Date(s) Administered  . Influenza Inj Mdck Quad Pf 04/25/2016  . Influenza Whole 04/20/2013  . Influenza-Unspecified 04/12/2014, 04/20/2015, 05/01/2017  . PPD Test 11/29/2012  . Pneumococcal Conjugate-13 08/01/2016   Pertinent  Health Maintenance Due  Topic Date Due  . INFLUENZA VACCINE  02/05/2017  . PNA vac Low Risk Adult (2 of 2 - PPSV23) 08/01/2017   Fall Risk  06/21/2016  Falls in the past year? No   Functional Status Survey:    Vitals:   05/06/17 1148  BP: (!) 120/58  Pulse: 86  Resp: 18  Temp: 98.7 F (37.1 C)  TempSrc: Oral  SpO2: 98%  Weight: 175 lb (79.4 kg)   Body mass index is 23.73 kg/m. Physical Exam  Constitutional: No distress.  Sitting up in wheelchair  Cardiovascular:   irreg irreg; chronic LE edema  Pulmonary/Chest: Effort normal and breath sounds normal. He has no rales.  Musculoskeletal:  Rigidity of lower extremities due to contracture  Neurological: He is alert.  Skin: Skin is warm.  Previously raised erythematous area with multiple inflamed follicles now with dry eschar over it, swelling down, not actively draining, pt w/o evidence of pain  Psychiatric:  Smiling and content    Labs reviewed:  Recent Labs  04/10/17 1127 04/17/17 0600 04/24/17 0600  NA 142 145 139  K 4.2 4.5 4.2  BUN 27* 28* 26*  CREATININE 1.1 0.9 1.0   No results for input(s): AST, ALT, ALKPHOS, BILITOT, PROT, ALBUMIN in the last 8760 hours.  Recent Labs  07/06/16 09/30/16 0630 11/26/16  WBC 7.6 6.7 7.6  HGB 12.9* 12.4* 12.5*  HCT 39* 38* 37*  PLT 206 203 155   Lab Results  Component Value Date   TSH 5.05 08/08/2016   No results found for: HGBA1C Lab Results  Component Value Date   CHOL 155 08/31/2015   HDL 33 (A) 08/31/2015   LDLCALC 99 08/31/2015   TRIG 116 08/31/2015    Assessment/Plan 1. Furuncle -due to self-drainage, pt's opinion, improvement in appearance, and anticipation that he will not tolerate it well due to pain and memory loss, I opted not to reopen the infected area (seems better at this time) -completed doxy, monitor  2. Late onset Alzheimer's disease with behavioral disturbance -end stage, on hospice care, some minimal speech typically, agitation with care sometimes  3. Permanent atrial fibrillation (H[facility]) -not requiring rate control meds  4. Hypercoagulable state due to atrial fibrillation ([facility] [place]) -remains on eliquis therapy which would also interfere with draining furuncle -would also favor discontinuing this with his advanced dementia, but his family has not been agreeable to this change when NP has recommended or hospice so far--pt also still getting labs to monitor volume status/heart/kidneys at family request  Family/ staff  Communication: discussed with memory care nursing  Labs/tests ordered:  None, recommend we discontinue labs  Tollie Canada L. Jaylun Fleener, D.O. [facility] [address], [place] [address] Cell Phone (Mon-Fri 8am-5pm):  [phone] On Call:  [phone] & follow prompts after 5pm & weekends Office Phone:  [phone] Office Fax:  [phone]

## 2017-05-08 DIAGNOSIS — I679 Cerebrovascular disease, unspecified: Secondary | ICD-10-CM | POA: Diagnosis not present

## 2017-05-08 DIAGNOSIS — G309 Alzheimer's disease, unspecified: Secondary | ICD-10-CM | POA: Diagnosis not present

## 2017-05-08 DIAGNOSIS — I509 Heart failure, unspecified: Secondary | ICD-10-CM | POA: Diagnosis not present

## 2017-05-08 DIAGNOSIS — N401 Enlarged prostate with lower urinary tract symptoms: Secondary | ICD-10-CM | POA: Diagnosis not present

## 2017-05-08 DIAGNOSIS — I25810 Atherosclerosis of coronary artery bypass graft(s) without angina pectoris: Secondary | ICD-10-CM | POA: Diagnosis not present

## 2017-05-08 DIAGNOSIS — R1310 Dysphagia, unspecified: Secondary | ICD-10-CM | POA: Diagnosis not present

## 2017-05-08 DIAGNOSIS — E785 Hyperlipidemia, unspecified: Secondary | ICD-10-CM | POA: Diagnosis not present

## 2017-05-08 DIAGNOSIS — K219 Gastro-esophageal reflux disease without esophagitis: Secondary | ICD-10-CM | POA: Diagnosis not present

## 2017-05-08 DIAGNOSIS — I5020 Unspecified systolic (congestive) heart failure: Secondary | ICD-10-CM | POA: Diagnosis not present

## 2017-05-08 DIAGNOSIS — I4891 Unspecified atrial fibrillation: Secondary | ICD-10-CM | POA: Diagnosis not present

## 2017-05-08 DIAGNOSIS — N183 Chronic kidney disease, stage 3 (moderate): Secondary | ICD-10-CM | POA: Diagnosis not present

## 2017-05-08 DIAGNOSIS — M2450 Contracture, unspecified joint: Secondary | ICD-10-CM | POA: Diagnosis not present

## 2017-05-08 DIAGNOSIS — D649 Anemia, unspecified: Secondary | ICD-10-CM | POA: Diagnosis not present

## 2017-05-12 DIAGNOSIS — I25810 Atherosclerosis of coronary artery bypass graft(s) without angina pectoris: Secondary | ICD-10-CM | POA: Diagnosis not present

## 2017-05-12 DIAGNOSIS — I679 Cerebrovascular disease, unspecified: Secondary | ICD-10-CM | POA: Diagnosis not present

## 2017-05-12 DIAGNOSIS — I4891 Unspecified atrial fibrillation: Secondary | ICD-10-CM | POA: Diagnosis not present

## 2017-05-12 DIAGNOSIS — I509 Heart failure, unspecified: Secondary | ICD-10-CM | POA: Diagnosis not present

## 2017-05-12 DIAGNOSIS — G309 Alzheimer's disease, unspecified: Secondary | ICD-10-CM | POA: Diagnosis not present

## 2017-05-12 DIAGNOSIS — N183 Chronic kidney disease, stage 3 (moderate): Secondary | ICD-10-CM | POA: Diagnosis not present

## 2017-05-15 DIAGNOSIS — I509 Heart failure, unspecified: Secondary | ICD-10-CM | POA: Diagnosis not present

## 2017-05-15 DIAGNOSIS — I25810 Atherosclerosis of coronary artery bypass graft(s) without angina pectoris: Secondary | ICD-10-CM | POA: Diagnosis not present

## 2017-05-15 DIAGNOSIS — D649 Anemia, unspecified: Secondary | ICD-10-CM | POA: Diagnosis not present

## 2017-05-15 DIAGNOSIS — I5020 Unspecified systolic (congestive) heart failure: Secondary | ICD-10-CM | POA: Diagnosis not present

## 2017-05-15 DIAGNOSIS — N183 Chronic kidney disease, stage 3 (moderate): Secondary | ICD-10-CM | POA: Diagnosis not present

## 2017-05-15 DIAGNOSIS — G309 Alzheimer's disease, unspecified: Secondary | ICD-10-CM | POA: Diagnosis not present

## 2017-05-15 DIAGNOSIS — I4891 Unspecified atrial fibrillation: Secondary | ICD-10-CM | POA: Diagnosis not present

## 2017-05-15 DIAGNOSIS — I679 Cerebrovascular disease, unspecified: Secondary | ICD-10-CM | POA: Diagnosis not present

## 2017-05-22 DIAGNOSIS — D649 Anemia, unspecified: Secondary | ICD-10-CM | POA: Diagnosis not present

## 2017-05-22 DIAGNOSIS — I509 Heart failure, unspecified: Secondary | ICD-10-CM | POA: Diagnosis not present

## 2017-05-22 DIAGNOSIS — I679 Cerebrovascular disease, unspecified: Secondary | ICD-10-CM | POA: Diagnosis not present

## 2017-05-22 DIAGNOSIS — I25810 Atherosclerosis of coronary artery bypass graft(s) without angina pectoris: Secondary | ICD-10-CM | POA: Diagnosis not present

## 2017-05-22 DIAGNOSIS — G309 Alzheimer's disease, unspecified: Secondary | ICD-10-CM | POA: Diagnosis not present

## 2017-05-22 DIAGNOSIS — I4891 Unspecified atrial fibrillation: Secondary | ICD-10-CM | POA: Diagnosis not present

## 2017-05-22 DIAGNOSIS — N183 Chronic kidney disease, stage 3 (moderate): Secondary | ICD-10-CM | POA: Diagnosis not present

## 2017-05-22 DIAGNOSIS — I5020 Unspecified systolic (congestive) heart failure: Secondary | ICD-10-CM | POA: Diagnosis not present

## 2017-05-23 DIAGNOSIS — G309 Alzheimer's disease, unspecified: Secondary | ICD-10-CM | POA: Diagnosis not present

## 2017-05-23 DIAGNOSIS — I509 Heart failure, unspecified: Secondary | ICD-10-CM | POA: Diagnosis not present

## 2017-05-23 DIAGNOSIS — I4891 Unspecified atrial fibrillation: Secondary | ICD-10-CM | POA: Diagnosis not present

## 2017-05-23 DIAGNOSIS — N183 Chronic kidney disease, stage 3 (moderate): Secondary | ICD-10-CM | POA: Diagnosis not present

## 2017-05-23 DIAGNOSIS — I679 Cerebrovascular disease, unspecified: Secondary | ICD-10-CM | POA: Diagnosis not present

## 2017-05-23 DIAGNOSIS — I25810 Atherosclerosis of coronary artery bypass graft(s) without angina pectoris: Secondary | ICD-10-CM | POA: Diagnosis not present

## 2017-05-26 DIAGNOSIS — N183 Chronic kidney disease, stage 3 (moderate): Secondary | ICD-10-CM | POA: Diagnosis not present

## 2017-05-26 DIAGNOSIS — I679 Cerebrovascular disease, unspecified: Secondary | ICD-10-CM | POA: Diagnosis not present

## 2017-05-26 DIAGNOSIS — G309 Alzheimer's disease, unspecified: Secondary | ICD-10-CM | POA: Diagnosis not present

## 2017-05-26 DIAGNOSIS — I25810 Atherosclerosis of coronary artery bypass graft(s) without angina pectoris: Secondary | ICD-10-CM | POA: Diagnosis not present

## 2017-05-26 DIAGNOSIS — I509 Heart failure, unspecified: Secondary | ICD-10-CM | POA: Diagnosis not present

## 2017-05-26 DIAGNOSIS — I4891 Unspecified atrial fibrillation: Secondary | ICD-10-CM | POA: Diagnosis not present

## 2017-05-27 DIAGNOSIS — I4891 Unspecified atrial fibrillation: Secondary | ICD-10-CM | POA: Diagnosis not present

## 2017-05-27 DIAGNOSIS — I679 Cerebrovascular disease, unspecified: Secondary | ICD-10-CM | POA: Diagnosis not present

## 2017-05-27 DIAGNOSIS — I25810 Atherosclerosis of coronary artery bypass graft(s) without angina pectoris: Secondary | ICD-10-CM | POA: Diagnosis not present

## 2017-05-27 DIAGNOSIS — G309 Alzheimer's disease, unspecified: Secondary | ICD-10-CM | POA: Diagnosis not present

## 2017-05-27 DIAGNOSIS — I509 Heart failure, unspecified: Secondary | ICD-10-CM | POA: Diagnosis not present

## 2017-05-27 DIAGNOSIS — N183 Chronic kidney disease, stage 3 (moderate): Secondary | ICD-10-CM | POA: Diagnosis not present

## 2017-05-28 ENCOUNTER — Non-Acute Institutional Stay (SKILLED_NURSING_FACILITY): Payer: Medicare Other

## 2017-05-28 DIAGNOSIS — Z0000 Encounter for general adult medical examination without abnormal findings: Secondary | ICD-10-CM

## 2017-05-28 NOTE — Progress Notes (Signed)
Subjective:   Bryan [name] is a 83 y.o. male who presents for an Initial Medicare Annual Wellness Visit at [facility]; incapacitated patient unable to answer questions appropriately       Objective:    Today's Vitals   05/28/17 1257  BP: 124/60  Pulse: (!) 48  Temp: 98 F (36.7 C)  TempSrc: Oral  SpO2: 97%  Weight: 175 lb (79.4 kg)  Height: 6' (1.829 m)   Body mass index is 23.73 kg/m.  Current Medications (verified) Outpatient Encounter Medications as of 05/28/2017  Medication Sig  . apixaban (ELIQUIS) 2.5 MG TABS tablet Take 2.5 mg by mouth 2 (two) times daily.  . bisacodyl (DULCOLAX) 10 MG suppository Place 10 mg rectally every other day.  . ipratropium-albuterol (DUONEB) 0.5-2.5 (3) MG/3ML SOLN Take 3 mLs by nebulization every 6 (six) hours as needed (for wheezing and cough). q 6 hrs prn  . lactose free nutrition (BOOST) LIQD Take 237 mLs by mouth 3 (three) times daily between meals.   . nitroGLYCERIN (NITROSTAT) 0.4 MG SL tablet Place 0.4 mg under the tongue every 5 (five) minutes as needed for chest pain.  . polyethylene glycol (MIRALAX / GLYCOLAX) packet Take 17 g by mouth daily.  . potassium chloride SA (K-DUR,KLOR-CON) 20 MEQ tablet Take 40 mEq by mouth daily.   No facility-administered encounter medications on file as of 05/28/2017.     Allergies (verified) Penicillins; Bee venom; and Risperidone and related   History: Past Medical History:  Diagnosis Date  . Allergic rhinitis   . Alzheimer's disease 2013  . Atrial fibrillation ([place])    Long term anti coagulation w/ warfarin fro stroke risk reduction  . BPH (benign prostatic hyperplasia)   . Chronic venous insufficiency 01/12/2013  . Dementia with behavioral disturbance 12/29/13  . Gait disorder   . GERD (gastroesophageal reflux disease)   . History of CVA (cerebrovascular accident) 2001   Rt.MCA  . Hyperlipidemia   . Lactose intolerance   . Long term (current) use of anticoagulants  11/23/2012   Long-term anticoagulation with warfarin for stroke risk reduction related to AF   Past Surgical History:  Procedure Laterality Date  . CATARACT EXTRACTION W/ INTRAOCULAR LENS  IMPLANT, BILATERAL    . HERNIA REPAIR Right 1970s  . KNEE SURGERY Right 2000  . MASS EXCISION Left 07/30/2013   Procedure: EXCISION OF BASAL CELL CARCINOMA  LEFT FOREHEAD ;  Surgeon: Irene Limbo, MD;  Location: [facility];  Service: Plastics;  Laterality: Left;  . RETINAL DETACHMENT SURGERY Right 2009   History reviewed. No pertinent family history. Social History   Occupational History  . Not on file  Tobacco Use  . Smoking status: Former Smoker    Last attempt to quit: 07/24/1983    Years since quitting: 33.8  . Smokeless tobacco: Former Systems developer    Quit date: 11/18/1970  Substance and Sexual Activity  . Alcohol use: Yes    Comment: occasionally  . Drug use: No  . Sexual activity: Not on file   Tobacco Counseling Counseling given: Not Answered   Activities of Daily Living In your present state of health, do you have any difficulty performing the following activities: 05/28/2017  Hearing? N  Vision? N  Difficulty concentrating or making decisions? Y  Walking or climbing stairs? Y  Dressing or bathing? Y  Doing errands, shopping? Y  Preparing Food and eating ? Y  Using the Toilet? Y  In the past six months, have you  accidently leaked urine? Y  Do you have problems with loss of bowel control? Y  Managing your Medications? Y  Managing your Finances? Y  Housekeeping or managing your Housekeeping? Y  Some recent data might be hidden    Immunizations and Health Maintenance Immunization History  Administered Date(s) Administered  . Influenza Inj Mdck Quad Pf 04/25/2016  . Influenza Whole 04/20/2013  . Influenza-Unspecified 04/12/2014, 04/20/2015, 05/01/2017  . PPD Test 11/29/2012  . Pneumococcal Conjugate-13 08/01/2016   Health Maintenance Due  Topic Date Due  .  Samul Dada  06/08/1942    Patient Care Team: [name], DO as PCP - General (Geriatric Medicine) Community, Well Angelique Holm, Margreta Journey, NP as Nurse Practitioner (Nurse Practitioner)  Indicate any recent Medical Services you may have received from other than [facility] providers in the past year (date may be approximate).    Assessment:   This is a routine wellness examination for Bryan [name].    Hearing/Vision screen No exam data present  Dietary issues and exercise activities discussed: Current Exercise Habits: The patient does not participate in regular exercise at present, Exercise limited by: neurologic condition(s)  Goals    None     Depression Screen PHQ 2/9 Scores 05/28/2017 06/21/2016  Exception Documentation Medical reason Patient refusal    Fall Risk Fall Risk  05/28/2017 06/21/2016  Falls in the past year? No No    Cognitive Function: MMSE - Mini Mental State Exam 05/28/2017 11/16/2012  Not completed: Unable to complete -  Orientation to time - 0  Orientation to Place - 0  Registration - 0  Attention/ Calculation - 0  Recall - 0  Language- name 2 objects - 0  Language- repeat - 1  Language- follow 3 step command - 3  Language- read & follow direction - 1  Write a sentence - 0  Copy design - 0  Total score - 5        Screening Tests Health Maintenance  Topic Date Due  . TETANUS/TDAP  06/08/1942  . PNA vac Low Risk Adult (2 of 2 - PPSV23) 08/01/2017  . INFLUENZA VACCINE  Completed        Plan:    I have personally reviewed and addressed the Medicare Annual Wellness questionnaire and have noted the following in the patient's chart:  A. Medical and social history B. Use of alcohol, tobacco or illicit drugs  C. Current medications and supplements D. Functional ability and status E.  Nutritional status F.  Physical activity G. Advance directives H. List of other physicians I.  Hospitalizations, surgeries, and ER visits in previous  12 months J.  [place] to include hearing, vision, cognitive, depression L. Referrals and appointments - none  In addition, I am unable to review and discuss with incapacitated patient certain preventive protocols, quality metrics, and best practice recommendations. A written personalized care plan for preventive services as well as general preventive health recommendations were provided to patient.   See attached scanned questionnaire for additional information.   Signed,   Rich Reining, RN Nurse Health Advisor   Quick Notes   Health Maintenance: TDAP due, ordered     Abnormal Screen:Unable to complete MMSE. Pulse 48     Patient Concerns: None     Nurse Concerns: None

## 2017-05-28 NOTE — Patient Instructions (Signed)
[name] , Thank you for taking time to come for your Medicare Wellness Visit. I appreciate your ongoing commitment to your health goals. Please review the following plan we discussed and let me know if I can assist you in the future.   Screening recommendations/referrals: Colonoscopy excluded, pt is over age 81 Recommended yearly ophthalmology/optometry visit for glaucoma screening and checkup Recommended yearly dental visit for hygiene and checkup  Vaccinations: Influenza vaccine up to date. Due 2019 fall season Pneumococcal vaccine up to date Tdap vaccine due, ordered Shingles vaccine not in records    Advanced directives: In Chart  Conditions/risks identified: None  Next appointment: Dr. Mariea Clonts makes rounds  Preventive Care 61 Years and Older, Male Preventive care refers to lifestyle choices and visits with your health care provider that can promote health and wellness. What does preventive care include?  A yearly physical exam. This is also called an annual well check.  Dental exams once or twice a year.  Routine eye exams. Ask your health care provider how often you should have your eyes checked.  Personal lifestyle choices, including:  Daily care of your teeth and gums.  Regular physical activity.  Eating a healthy diet.  Avoiding tobacco and drug use.  Limiting alcohol use.  Practicing safe sex.  Taking low doses of aspirin every day.  Taking vitamin and mineral supplements as recommended by your health care provider. What happens during an annual well check? The services and screenings done by your health care provider during your annual well check will depend on your age, overall health, lifestyle risk factors, and family history of disease. Counseling  Your health care provider may ask you questions about your:  Alcohol use.  Tobacco use.  Drug use.  Emotional well-being.  Home and relationship well-being.  Sexual activity.  Eating  habits.  History of falls.  Memory and ability to understand (cognition).  Work and work Statistician. Screening  You may have the following tests or measurements:  Height, weight, and BMI.  Blood pressure.  Lipid and cholesterol levels. These may be checked every 5 years, or more frequently if you are over 62 years old.  Skin check.  Lung cancer screening. You may have this screening every year starting at age 33 if you have a 30-pack-year history of smoking and currently smoke or have quit within the past 15 years.  Fecal occult blood test (FOBT) of the stool. You may have this test every year starting at age 54.  Flexible sigmoidoscopy or colonoscopy. You may have a sigmoidoscopy every 5 years or a colonoscopy every 10 years starting at age 86.  Prostate cancer screening. Recommendations will vary depending on your family history and other risks.  Hepatitis C blood test.  Hepatitis B blood test.  Sexually transmitted disease (STD) testing.  Diabetes screening. This is done by checking your blood sugar (glucose) after you have not eaten for a while (fasting). You may have this done every 1-3 years.  Abdominal aortic aneurysm (AAA) screening. You may need this if you are a current or former smoker.  Osteoporosis. You may be screened starting at age 77 if you are at high risk. Talk with your health care provider about your test results, treatment options, and if necessary, the need for more tests. Vaccines  Your health care provider may recommend certain vaccines, such as:  Influenza vaccine. This is recommended every year.  Tetanus, diphtheria, and acellular pertussis (Tdap, Td) vaccine. You may need a Td booster every  10 years.  Zoster vaccine. You may need this after age 21.  Pneumococcal 13-valent conjugate (PCV13) vaccine. One dose is recommended after age 65.  Pneumococcal polysaccharide (PPSV23) vaccine. One dose is recommended after age 47. Talk to your health  care provider about which screenings and vaccines you need and how often you need them. This information is not intended to replace advice given to you by your health care provider. Make sure you discuss any questions you have with your health care provider. Document Released: 07/21/2015 Document Revised: 03/13/2016 Document Reviewed: 04/25/2015 Elsevier Interactive Patient Education  2017 [place] Prevention in the Home Falls can cause injuries. They can happen to people of all ages. There are many things you can do to make your home safe and to help prevent falls. What can I do on the outside of my home?  Regularly fix the edges of walkways and driveways and fix any cracks.  Remove anything that might make you trip as you walk through a door, such as a raised step or threshold.  Trim any bushes or trees on the path to your home.  Use bright outdoor lighting.  Clear any walking paths of anything that might make someone trip, such as rocks or tools.  Regularly check to see if handrails are loose or broken. Make sure that both sides of any steps have handrails.  Any raised decks and porches should have guardrails on the edges.  Have any leaves, snow, or ice cleared regularly.  Use sand or salt on walking paths during winter.  Clean up any spills in your garage right away. This includes oil or grease spills. What can I do in the bathroom?  Use night lights.  Install grab bars by the toilet and in the tub and shower. Do not use towel bars as grab bars.  Use non-skid mats or decals in the tub or shower.  If you need to sit down in the shower, use a plastic, non-slip stool.  Keep the floor dry. Clean up any water that spills on the floor as soon as it happens.  Remove soap buildup in the tub or shower regularly.  Attach bath mats securely with double-sided non-slip rug tape.  Do not have throw rugs and other things on the floor that can make you trip. What can I do  in the bedroom?  Use night lights.  Make sure that you have a light by your bed that is easy to reach.  Do not use any sheets or blankets that are too big for your bed. They should not hang down onto the floor.  Have a firm chair that has side arms. You can use this for support while you get dressed.  Do not have throw rugs and other things on the floor that can make you trip. What can I do in the kitchen?  Clean up any spills right away.  Avoid walking on wet floors.  Keep items that you use a lot in easy-to-reach places.  If you need to reach something above you, use a strong step stool that has a grab bar.  Keep electrical cords out of the way.  Do not use floor polish or wax that makes floors slippery. If you must use wax, use non-skid floor wax.  Do not have throw rugs and other things on the floor that can make you trip. What can I do with my stairs?  Do not leave any items on the stairs.  Make sure  that there are handrails on both sides of the stairs and use them. Fix handrails that are broken or loose. Make sure that handrails are as long as the stairways.  Check any carpeting to make sure that it is firmly attached to the stairs. Fix any carpet that is loose or worn.  Avoid having throw rugs at the top or bottom of the stairs. If you do have throw rugs, attach them to the floor with carpet tape.  Make sure that you have a light switch at the top of the stairs and the bottom of the stairs. If you do not have them, ask someone to add them for you. What else can I do to help prevent falls?  Wear shoes that:  Do not have high heels.  Have rubber bottoms.  Are comfortable and fit you well.  Are closed at the toe. Do not wear sandals.  If you use a stepladder:  Make sure that it is fully opened. Do not climb a closed stepladder.  Make sure that both sides of the stepladder are locked into place.  Ask someone to hold it for you, if possible.  Clearly mark  and make sure that you can see:  Any grab bars or handrails.  First and last steps.  Where the edge of each step is.  Use tools that help you move around (mobility aids) if they are needed. These include:  Canes.  Walkers.  Scooters.  Crutches.  Turn on the lights when you go into a dark area. Replace any light bulbs as soon as they burn out.  Set up your furniture so you have a clear path. Avoid moving your furniture around.  If any of your floors are uneven, fix them.  If there are any pets around you, be aware of where they are.  Review your medicines with your doctor. Some medicines can make you feel dizzy. This can increase your chance of falling. Ask your doctor what other things that you can do to help prevent falls. This information is not intended to replace advice given to you by your health care provider. Make sure you discuss any questions you have with your health care provider. Document Released: 04/20/2009 Document Revised: 11/30/2015 Document Reviewed: 07/29/2014 Elsevier Interactive Patient Education  2017 [employer].

## 2017-05-30 DIAGNOSIS — I5020 Unspecified systolic (congestive) heart failure: Secondary | ICD-10-CM | POA: Diagnosis not present

## 2017-05-30 DIAGNOSIS — D649 Anemia, unspecified: Secondary | ICD-10-CM | POA: Diagnosis not present

## 2017-06-03 DIAGNOSIS — I4891 Unspecified atrial fibrillation: Secondary | ICD-10-CM | POA: Diagnosis not present

## 2017-06-03 DIAGNOSIS — N183 Chronic kidney disease, stage 3 (moderate): Secondary | ICD-10-CM | POA: Diagnosis not present

## 2017-06-03 DIAGNOSIS — G309 Alzheimer's disease, unspecified: Secondary | ICD-10-CM | POA: Diagnosis not present

## 2017-06-03 DIAGNOSIS — I25810 Atherosclerosis of coronary artery bypass graft(s) without angina pectoris: Secondary | ICD-10-CM | POA: Diagnosis not present

## 2017-06-03 DIAGNOSIS — I679 Cerebrovascular disease, unspecified: Secondary | ICD-10-CM | POA: Diagnosis not present

## 2017-06-03 DIAGNOSIS — I509 Heart failure, unspecified: Secondary | ICD-10-CM | POA: Diagnosis not present

## 2017-06-05 DIAGNOSIS — D649 Anemia, unspecified: Secondary | ICD-10-CM | POA: Diagnosis not present

## 2017-06-05 DIAGNOSIS — I5020 Unspecified systolic (congestive) heart failure: Secondary | ICD-10-CM | POA: Diagnosis not present

## 2017-06-07 DIAGNOSIS — I679 Cerebrovascular disease, unspecified: Secondary | ICD-10-CM | POA: Diagnosis not present

## 2017-06-07 DIAGNOSIS — M2450 Contracture, unspecified joint: Secondary | ICD-10-CM | POA: Diagnosis not present

## 2017-06-07 DIAGNOSIS — I25810 Atherosclerosis of coronary artery bypass graft(s) without angina pectoris: Secondary | ICD-10-CM | POA: Diagnosis not present

## 2017-06-07 DIAGNOSIS — N401 Enlarged prostate with lower urinary tract symptoms: Secondary | ICD-10-CM | POA: Diagnosis not present

## 2017-06-07 DIAGNOSIS — N183 Chronic kidney disease, stage 3 (moderate): Secondary | ICD-10-CM | POA: Diagnosis not present

## 2017-06-07 DIAGNOSIS — G309 Alzheimer's disease, unspecified: Secondary | ICD-10-CM | POA: Diagnosis not present

## 2017-06-07 DIAGNOSIS — R1310 Dysphagia, unspecified: Secondary | ICD-10-CM | POA: Diagnosis not present

## 2017-06-07 DIAGNOSIS — K219 Gastro-esophageal reflux disease without esophagitis: Secondary | ICD-10-CM | POA: Diagnosis not present

## 2017-06-07 DIAGNOSIS — I4891 Unspecified atrial fibrillation: Secondary | ICD-10-CM | POA: Diagnosis not present

## 2017-06-07 DIAGNOSIS — I509 Heart failure, unspecified: Secondary | ICD-10-CM | POA: Diagnosis not present

## 2017-06-07 DIAGNOSIS — E785 Hyperlipidemia, unspecified: Secondary | ICD-10-CM | POA: Diagnosis not present

## 2017-06-09 DIAGNOSIS — I25810 Atherosclerosis of coronary artery bypass graft(s) without angina pectoris: Secondary | ICD-10-CM | POA: Diagnosis not present

## 2017-06-09 DIAGNOSIS — I509 Heart failure, unspecified: Secondary | ICD-10-CM | POA: Diagnosis not present

## 2017-06-09 DIAGNOSIS — I4891 Unspecified atrial fibrillation: Secondary | ICD-10-CM | POA: Diagnosis not present

## 2017-06-09 DIAGNOSIS — G309 Alzheimer's disease, unspecified: Secondary | ICD-10-CM | POA: Diagnosis not present

## 2017-06-09 DIAGNOSIS — N183 Chronic kidney disease, stage 3 (moderate): Secondary | ICD-10-CM | POA: Diagnosis not present

## 2017-06-09 DIAGNOSIS — I679 Cerebrovascular disease, unspecified: Secondary | ICD-10-CM | POA: Diagnosis not present

## 2017-06-10 DIAGNOSIS — I679 Cerebrovascular disease, unspecified: Secondary | ICD-10-CM | POA: Diagnosis not present

## 2017-06-10 DIAGNOSIS — I4891 Unspecified atrial fibrillation: Secondary | ICD-10-CM | POA: Diagnosis not present

## 2017-06-10 DIAGNOSIS — G309 Alzheimer's disease, unspecified: Secondary | ICD-10-CM | POA: Diagnosis not present

## 2017-06-10 DIAGNOSIS — I509 Heart failure, unspecified: Secondary | ICD-10-CM | POA: Diagnosis not present

## 2017-06-10 DIAGNOSIS — N183 Chronic kidney disease, stage 3 (moderate): Secondary | ICD-10-CM | POA: Diagnosis not present

## 2017-06-10 DIAGNOSIS — I25810 Atherosclerosis of coronary artery bypass graft(s) without angina pectoris: Secondary | ICD-10-CM | POA: Diagnosis not present

## 2017-06-12 DIAGNOSIS — D649 Anemia, unspecified: Secondary | ICD-10-CM | POA: Diagnosis not present

## 2017-06-12 DIAGNOSIS — I5020 Unspecified systolic (congestive) heart failure: Secondary | ICD-10-CM | POA: Diagnosis not present

## 2017-06-13 DIAGNOSIS — I509 Heart failure, unspecified: Secondary | ICD-10-CM | POA: Diagnosis not present

## 2017-06-13 DIAGNOSIS — I25810 Atherosclerosis of coronary artery bypass graft(s) without angina pectoris: Secondary | ICD-10-CM | POA: Diagnosis not present

## 2017-06-13 DIAGNOSIS — I679 Cerebrovascular disease, unspecified: Secondary | ICD-10-CM | POA: Diagnosis not present

## 2017-06-13 DIAGNOSIS — I4891 Unspecified atrial fibrillation: Secondary | ICD-10-CM | POA: Diagnosis not present

## 2017-06-13 DIAGNOSIS — G309 Alzheimer's disease, unspecified: Secondary | ICD-10-CM | POA: Diagnosis not present

## 2017-06-13 DIAGNOSIS — N183 Chronic kidney disease, stage 3 (moderate): Secondary | ICD-10-CM | POA: Diagnosis not present

## 2017-06-17 DIAGNOSIS — I509 Heart failure, unspecified: Secondary | ICD-10-CM | POA: Diagnosis not present

## 2017-06-17 DIAGNOSIS — N183 Chronic kidney disease, stage 3 (moderate): Secondary | ICD-10-CM | POA: Diagnosis not present

## 2017-06-17 DIAGNOSIS — I679 Cerebrovascular disease, unspecified: Secondary | ICD-10-CM | POA: Diagnosis not present

## 2017-06-17 DIAGNOSIS — G309 Alzheimer's disease, unspecified: Secondary | ICD-10-CM | POA: Diagnosis not present

## 2017-06-17 DIAGNOSIS — I4891 Unspecified atrial fibrillation: Secondary | ICD-10-CM | POA: Diagnosis not present

## 2017-06-17 DIAGNOSIS — I25810 Atherosclerosis of coronary artery bypass graft(s) without angina pectoris: Secondary | ICD-10-CM | POA: Diagnosis not present

## 2017-06-19 DIAGNOSIS — I5020 Unspecified systolic (congestive) heart failure: Secondary | ICD-10-CM | POA: Diagnosis not present

## 2017-06-19 DIAGNOSIS — D649 Anemia, unspecified: Secondary | ICD-10-CM | POA: Diagnosis not present

## 2017-06-20 DIAGNOSIS — N183 Chronic kidney disease, stage 3 (moderate): Secondary | ICD-10-CM | POA: Diagnosis not present

## 2017-06-20 DIAGNOSIS — I679 Cerebrovascular disease, unspecified: Secondary | ICD-10-CM | POA: Diagnosis not present

## 2017-06-20 DIAGNOSIS — G309 Alzheimer's disease, unspecified: Secondary | ICD-10-CM | POA: Diagnosis not present

## 2017-06-20 DIAGNOSIS — I4891 Unspecified atrial fibrillation: Secondary | ICD-10-CM | POA: Diagnosis not present

## 2017-06-20 DIAGNOSIS — I25810 Atherosclerosis of coronary artery bypass graft(s) without angina pectoris: Secondary | ICD-10-CM | POA: Diagnosis not present

## 2017-06-20 DIAGNOSIS — I509 Heart failure, unspecified: Secondary | ICD-10-CM | POA: Diagnosis not present

## 2017-06-23 ENCOUNTER — Encounter: Payer: Self-pay | Admitting: Adult Health

## 2017-06-23 ENCOUNTER — Non-Acute Institutional Stay: Payer: Medicare Other | Admitting: Adult Health

## 2017-06-23 DIAGNOSIS — R339 Retention of urine, unspecified: Secondary | ICD-10-CM | POA: Diagnosis not present

## 2017-06-23 DIAGNOSIS — E860 Dehydration: Secondary | ICD-10-CM | POA: Diagnosis not present

## 2017-06-23 DIAGNOSIS — I679 Cerebrovascular disease, unspecified: Secondary | ICD-10-CM | POA: Diagnosis not present

## 2017-06-23 DIAGNOSIS — J811 Chronic pulmonary edema: Secondary | ICD-10-CM | POA: Diagnosis not present

## 2017-06-23 DIAGNOSIS — G309 Alzheimer's disease, unspecified: Secondary | ICD-10-CM | POA: Diagnosis not present

## 2017-06-23 DIAGNOSIS — I509 Heart failure, unspecified: Secondary | ICD-10-CM | POA: Diagnosis not present

## 2017-06-23 DIAGNOSIS — I10 Essential (primary) hypertension: Secondary | ICD-10-CM | POA: Diagnosis not present

## 2017-06-23 DIAGNOSIS — N390 Urinary tract infection, site not specified: Secondary | ICD-10-CM | POA: Diagnosis not present

## 2017-06-23 DIAGNOSIS — D649 Anemia, unspecified: Secondary | ICD-10-CM | POA: Diagnosis not present

## 2017-06-23 DIAGNOSIS — N183 Chronic kidney disease, stage 3 (moderate): Secondary | ICD-10-CM | POA: Diagnosis not present

## 2017-06-23 DIAGNOSIS — Z79899 Other long term (current) drug therapy: Secondary | ICD-10-CM | POA: Diagnosis not present

## 2017-06-23 DIAGNOSIS — N179 Acute kidney failure, unspecified: Secondary | ICD-10-CM | POA: Diagnosis not present

## 2017-06-23 DIAGNOSIS — D729 Disorder of white blood cells, unspecified: Secondary | ICD-10-CM

## 2017-06-23 DIAGNOSIS — R319 Hematuria, unspecified: Secondary | ICD-10-CM | POA: Diagnosis not present

## 2017-06-23 DIAGNOSIS — E870 Hyperosmolality and hypernatremia: Secondary | ICD-10-CM | POA: Diagnosis not present

## 2017-06-23 DIAGNOSIS — I4891 Unspecified atrial fibrillation: Secondary | ICD-10-CM | POA: Diagnosis not present

## 2017-06-23 DIAGNOSIS — I25810 Atherosclerosis of coronary artery bypass graft(s) without angina pectoris: Secondary | ICD-10-CM | POA: Diagnosis not present

## 2017-06-23 LAB — CBC AND DIFFERENTIAL
HCT: 44 (ref 41–53)
HEMOGLOBIN: 13.9 (ref 13.5–17.5)
Platelets: 240 (ref 150–399)
WBC: 16.2

## 2017-06-23 LAB — BASIC METABOLIC PANEL
BUN: 33 — AB (ref 4–21)
Potassium: 4.4 (ref 3.4–5.3)
Sodium: 146 (ref 137–147)

## 2017-06-23 NOTE — Progress Notes (Signed)
Location:  Occupational psychologist of Service:  SNF (31) Provider:   Cindi Carbon, ANP [place] [phone]   Bryan Curry, DO  Patient Care Team: Bryan Curry, DO as PCP - General (Geriatric Medicine) Community, Well Angelique Holm, Margreta Journey, NP as Nurse Practitioner (Nurse Practitioner)  Extended Emergency Contact Information Primary Emergency Contact: Bryan [name] Address: [address]          [place], [place] [address] [place] Phone: [phone] Relation: Spouse Secondary Emergency Contact: Bryan [name], Lott [place] Phone: [phone] Relation: Daughter  Code Status:  DNR Goals of care: Advanced Directive information Advanced Directives 06/23/2017  Does Patient Have a Medical Advance Directive? Yes  Type of Paramedic of [place];Living will;Out of facility DNR (pink MOST or yellow form)  Does patient want to make changes to medical advance directive? -  Copy of [place] in Chart? Yes  Pre-existing out of facility DNR order (yellow form or pink MOST form) Pink MOST form placed in chart (order not valid for inpatient use);Yellow form placed in chart (order not valid for inpatient use)     Chief Complaint  Patient presents with  . Acute Visit    fever, cough, purulent urine    HPI:  Pt is a 83 y.o. male seen today for an acute visit for fever, cough, and purulent urine. Bryan [name] has advanced dementia and is non verbal. He is followed by hospice for dementia, weight loss, and dysphagia.  Staff report on the evening shift he had a temp of 101.7.  He was given tylenol and is now 3.  The staff noted purulent urine with blood as well. The blood cleared this am but the purulent appearance has remained. They also report a congested cough. He has a hx of dysphagia and has been on a modified diet for  quite some time. His family continues to request lab work to evaluate his hydration status on a weekly basis. Unfortunately Bryan [name] is not able to speak to contribute to the history. He is lethargic and slightly short of breath.    Past Medical History:  Diagnosis Date  . Allergic rhinitis   . Alzheimer's disease 2013  . Atrial fibrillation ([place])    Long term anti coagulation w/ warfarin fro stroke risk reduction  . BPH (benign prostatic hyperplasia)   . Chronic venous insufficiency 01/12/2013  . Dementia with behavioral disturbance 12/29/13  . Gait disorder   . GERD (gastroesophageal reflux disease)   . History of CVA (cerebrovascular accident) 2001   Rt.MCA  . Hyperlipidemia   . Lactose intolerance   . Long term (current) use of anticoagulants 11/23/2012   Long-term anticoagulation with warfarin for stroke risk reduction related to AF   Past Surgical History:  Procedure Laterality Date  . CATARACT EXTRACTION W/ INTRAOCULAR LENS  IMPLANT, BILATERAL    . HERNIA REPAIR Right 1970s  . KNEE SURGERY Right 2000  . MASS EXCISION Left 07/30/2013   Procedure: EXCISION OF BASAL CELL CARCINOMA  LEFT FOREHEAD ;  Surgeon: Irene Limbo, MD;  Location: [facility];  Service: Plastics;  Laterality: Left;  . RETINAL DETACHMENT SURGERY Right 2009    Allergies  Allergen Reactions  . Penicillins Rash  . Bee Venom   . Risperidone And Related     Unstable gait    Outpatient  Encounter Medications as of 06/23/2017  Medication Sig  . apixaban (ELIQUIS) 2.5 MG TABS tablet Take 2.5 mg by mouth 2 (two) times daily.  . bisacodyl (DULCOLAX) 10 MG suppository Place 10 mg rectally every other day.  . ipratropium-albuterol (DUONEB) 0.5-2.5 (3) MG/3ML SOLN Take 3 mLs by nebulization every 6 (six) hours as needed (for wheezing and cough). q 6 hrs prn  . lactose free nutrition (BOOST) LIQD Take 237 mLs by mouth 3 (three) times daily between meals.   . nitroGLYCERIN (NITROSTAT) 0.4 MG SL  tablet Place 0.4 mg under the tongue every 5 (five) minutes as needed for chest pain.  . potassium chloride SA (K-DUR,KLOR-CON) 20 MEQ tablet Take 40 mEq by mouth daily.  . [DISCONTINUED] polyethylene glycol (MIRALAX / GLYCOLAX) packet Take 17 g by mouth daily.   No facility-administered encounter medications on file as of 06/23/2017.     Review of Systems  Unable to perform ROS: Dementia    Immunization History  Administered Date(s) Administered  . Influenza Inj Mdck Quad Pf 04/25/2016  . Influenza Whole 04/20/2013  . Influenza-Unspecified 04/12/2014, 04/20/2015, 05/01/2017  . PPD Test 11/29/2012  . Pneumococcal Conjugate-13 08/01/2016   Pertinent  Health Maintenance Due  Topic Date Due  . PNA vac Low Risk Adult (2 of 2 - PPSV23) 08/01/2017  . INFLUENZA VACCINE  Completed   Fall Risk  05/28/2017 06/21/2016  Falls in the past year? No No   Functional Status Survey:    Vitals:   06/23/17 1131  BP: 103/80  Pulse: 81  Resp: (!) 24  Temp: (!) 97 F (36.1 C)  SpO2: 98%  Weight: 178 lb (80.7 kg)   Body mass index is 24.14 kg/m. Physical Exam  Constitutional:  Slight increase work of breathing  HENT:  White mucus noted on tongue and in the back of his throat  Eyes: Conjunctivae are normal. Right eye exhibits no discharge.  Neck: No JVD present.  Cardiovascular: Normal rate and regular rhythm.  No murmur heard. BLE edema +1  Pulmonary/Chest:  Bilateral upper lobe rhonchi, decreased but clear lowers. Slight increased wob, RR 24-28  Abdominal: Soft. Bowel sounds are normal. He exhibits no distension. There is no tenderness.  Lymphadenopathy:    He has no cervical adenopathy.  Neurological:  Lethargic, no obvious facial droop but not able to f/c.  Pin point pupils with minimal reactivity noted.   Skin: Skin is warm and dry. He is not diaphoretic.  Nursing note and vitals reviewed.   Labs reviewed: Recent Labs    04/10/17 1127 04/17/17 0600 04/24/17 0600  06/23/17  NA 142 145 139 146  K 4.2 4.5 4.2 4.4  BUN 27* 28* 26* 33*  CREATININE 1.1 0.9 1.0  --    No results for input(s): AST, ALT, ALKPHOS, BILITOT, PROT, ALBUMIN in the last 8760 hours. Recent Labs    09/30/16 0630 11/26/16 06/23/17  WBC 6.7 7.6 16.2  HGB 12.4* 12.5* 13.9  HCT 38* 37* 44  PLT 203 155 240   Lab Results  Component Value Date   TSH 5.05 08/08/2016   No results found for: HGBA1C Lab Results  Component Value Date   CHOL 155 08/31/2015   HDL 33 (A) 08/31/2015   LDLCALC 99 08/31/2015   TRIG 116 08/31/2015    Significant Diagnostic Results in last 30 days:  No results found.  Assessment/Plan  1) Leukocytosis With fever He has a cough and purulent urine and so he will need broad spectrum coverage while  awaiting the urine and CXR results Begin Levaquin 500 mg qd for 1 dose then 250 mg qd for 6 more doses (renally dosed)  2) Acute renal failure Begin 1/2 NS at 80 cc/hr for 1 L then repeat BMP Monitor respiratory status  I spoke with his son [name] who is a cardiologist. He stated "I do not want my father to die from an untreated infection".  We agreed to treat him with antibiotics as indicated. His son also wanted IVF if necessary for hydration if necessary. We agreed to avoid hospitalizations if at all possible. This resident is nearing the end of life with a slow decline and poor quality of life. Treating infections at this point is only prolonging his life, especially given the fact that many pts with dementia die from infections and not the dementia itself. He will continue to be followed by hospice and work on goals of care. He is a DNR.  Family/ staff Communication: discussed with resident's son [name]  Labs/tests ordered:  CBC BMP UA C and S and CXR

## 2017-06-24 DIAGNOSIS — I5032 Chronic diastolic (congestive) heart failure: Secondary | ICD-10-CM | POA: Diagnosis not present

## 2017-06-24 DIAGNOSIS — D649 Anemia, unspecified: Secondary | ICD-10-CM | POA: Diagnosis not present

## 2017-06-24 DIAGNOSIS — R339 Retention of urine, unspecified: Secondary | ICD-10-CM | POA: Diagnosis not present

## 2017-06-26 DIAGNOSIS — I5020 Unspecified systolic (congestive) heart failure: Secondary | ICD-10-CM | POA: Diagnosis not present

## 2017-06-26 DIAGNOSIS — D649 Anemia, unspecified: Secondary | ICD-10-CM | POA: Diagnosis not present

## 2017-06-26 LAB — BASIC METABOLIC PANEL
BUN: 27 — AB (ref 4–21)
CREATININE: 1 (ref 0.6–1.3)
Glucose: 106
Potassium: 4 (ref 3.4–5.3)
SODIUM: 147 (ref 137–147)

## 2017-06-27 ENCOUNTER — Encounter: Payer: Self-pay | Admitting: Adult Health

## 2017-06-27 ENCOUNTER — Non-Acute Institutional Stay: Payer: Medicare Other | Admitting: Adult Health

## 2017-06-27 DIAGNOSIS — N179 Acute kidney failure, unspecified: Secondary | ICD-10-CM | POA: Diagnosis not present

## 2017-06-27 DIAGNOSIS — J181 Lobar pneumonia, unspecified organism: Secondary | ICD-10-CM | POA: Diagnosis not present

## 2017-06-27 DIAGNOSIS — N3001 Acute cystitis with hematuria: Secondary | ICD-10-CM | POA: Diagnosis not present

## 2017-06-27 NOTE — Progress Notes (Signed)
Location:  Occupational psychologist of Service:  SNF (31) Provider:   Cindi Carbon, ANP [place] [phone]   [name], DO  Patient Care Team: [name], DO as PCP - General (Geriatric Medicine) Community, [facility] Holm, Margreta Journey, NP as Nurse Practitioner (Nurse Practitioner)  Extended Emergency Contact Information Primary Emergency Contact: [name] Address: [address]          [place], [place] [address] [place] Phone: [phone] Relation: Spouse Secondary Emergency Contact: [name], [place] Phone: [phone] Relation: Daughter  Code Status:  DNR Goals of care: Advanced Directive information Advanced Directives 06/23/2017  Does Patient Have a Medical Advance Directive? Yes  Type of Paramedic of [place];Living will;Out of facility DNR (pink MOST or yellow form)  Does patient want to make changes to medical advance directive? -  Copy of [place] in Chart? Yes  Pre-existing out of facility DNR order (yellow form or pink MOST form) Pink MOST form placed in chart (order not valid for inpatient use);Yellow form placed in chart (order not valid for inpatient use)     Chief Complaint  Patient presents with  . Acute Visit    f/u pneumonia, UTI, dehydration    HPI:  Pt is a 83 y.o. male seen today for an acute visit to follow up regarding pneumonia and a UTI. He was seen on 12/17 for lethargy, fever, hematuria, and cough and congestion.   UTI: on levaquin day 5 >100,000 colonies of Klebsiella sens to levaquin No fever or other symptoms. Initially he had hematuria which resolved  CXR 06/23/17: small RLL infiltrate on levaquin  No sob, cough, fever, or sputum production  ARF: improved, treated with 1 liter of fluid Lab Results  Component Value Date   BUN 27 (A)  06/26/2017   Lab Results  Component Value Date   CREATININE 1.0 06/26/2017    Past Medical History:  Diagnosis Date  . Allergic rhinitis   . Alzheimer's disease 2013  . Atrial fibrillation ([place])    Long term anti coagulation w/ warfarin fro stroke risk reduction  . BPH (benign prostatic hyperplasia)   . Chronic venous insufficiency 01/12/2013  . Dementia with behavioral disturbance 12/29/13  . Gait disorder   . GERD (gastroesophageal reflux disease)   . History of CVA (cerebrovascular accident) 2001   Rt.MCA  . Hyperlipidemia   . Lactose intolerance   . Long term (current) use of anticoagulants 11/23/2012   Long-term anticoagulation with warfarin for stroke risk reduction related to AF   Past Surgical History:  Procedure Laterality Date  . CATARACT EXTRACTION W/ INTRAOCULAR LENS  IMPLANT, BILATERAL    . HERNIA REPAIR Right 1970s  . KNEE SURGERY Right 2000  . MASS EXCISION Left 07/30/2013   Procedure: EXCISION OF BASAL CELL CARCINOMA  LEFT FOREHEAD ;  Surgeon: Irene Limbo, MD;  Location: [facility];  Service: Plastics;  Laterality: Left;  . RETINAL DETACHMENT SURGERY Right 2009    Allergies  Allergen Reactions  . Penicillins Rash  . Bee Venom   . Risperidone And Related     Unstable gait    Outpatient Encounter Medications as of 06/27/2017  Medication Sig  . levofloxacin (LEVAQUIN) 500 MG tablet Take 500 mg by mouth daily. 500 mg on day 1 then 250 mg qd for 6 more  days  . saccharomyces boulardii (FLORASTOR) 250 MG capsule Take 250 mg by mouth 2 (two) times daily. For 7 days  . apixaban (ELIQUIS) 2.5 MG TABS tablet Take 2.5 mg by mouth 2 (two) times daily.  . bisacodyl (DULCOLAX) 10 MG suppository Place 10 mg rectally every other day.  . ipratropium-albuterol (DUONEB) 0.5-2.5 (3) MG/3ML SOLN Take 3 mLs by nebulization every 6 (six) hours as needed (for wheezing and cough). q 6 hrs prn  . lactose free nutrition (BOOST) LIQD Take 237 mLs by mouth 3 (three)  times daily between meals.   . nitroGLYCERIN (NITROSTAT) 0.4 MG SL tablet Place 0.4 mg under the tongue every 5 (five) minutes as needed for chest pain.  . potassium chloride SA (K-DUR,KLOR-CON) 20 MEQ tablet Take 40 mEq by mouth daily.   No facility-administered encounter medications on file as of 06/27/2017.     Review of Systems  Unable to perform ROS: Dementia    Immunization History  Administered Date(s) Administered  . Influenza Inj Mdck Quad Pf 04/25/2016  . Influenza Whole 04/20/2013  . Influenza-Unspecified 04/12/2014, 04/20/2015, 05/01/2017  . PPD Test 11/29/2012  . Pneumococcal Conjugate-13 08/01/2016   Pertinent  Health Maintenance Due  Topic Date Due  . PNA vac Low Risk Adult (2 of 2 - PPSV23) 08/01/2017  . INFLUENZA VACCINE  Completed   Fall Risk  05/28/2017 06/21/2016  Falls in the past year? No No   Functional Status Survey:    Vitals:   06/27/17 1114  BP: 134/81  Pulse: 72  Resp: 20  Temp: 97.9 F (36.6 C)  SpO2: 95%  Weight: 177 lb (80.3 kg)   Body mass index is 24.01 kg/m. Physical Exam  Constitutional: No distress.  HENT:  Head: Normocephalic and atraumatic.  Nose: Nose normal.  Mouth/Throat: No oropharyngeal exudate.  Eyes: Conjunctivae are normal. Pupils are equal, round, and reactive to light. Right eye exhibits no discharge. Left eye exhibits no discharge.  Neck: Normal range of motion. Neck supple. No JVD present. No tracheal deviation present. No thyromegaly present.  Cardiovascular: Normal rate and regular rhythm.  No murmur heard. BLE edema +1  Pulmonary/Chest: Effort normal and breath sounds normal. No respiratory distress. He has no wheezes.  Abdominal: Soft. Bowel sounds are normal. He exhibits no distension. There is no tenderness.  Lymphadenopathy:    He has no cervical adenopathy.  Neurological: He is alert. No cranial nerve deficit.  Not oriented or able to f/c.   Skin: Skin is warm and dry. He is not diaphoretic.    Psychiatric: He has a normal mood and affect.  Nursing note and vitals reviewed.   Labs reviewed: Recent Labs    04/17/17 0600 04/24/17 0600 06/23/17 06/26/17  NA 145 139 146 147  K 4.5 4.2 4.4 4.0  BUN 28* 26* 33* 27*  CREATININE 0.9 1.0  --  1.0   No results for input(s): AST, ALT, ALKPHOS, BILITOT, PROT, ALBUMIN in the last 8760 hours. Recent Labs    09/30/16 0630 11/26/16 06/23/17  WBC 6.7 7.6 16.2  HGB 12.4* 12.5* 13.9  HCT 38* 37* 44  PLT 203 155 240   Lab Results  Component Value Date   TSH 5.05 08/08/2016   No results found for: HGBA1C Lab Results  Component Value Date   CHOL 155 08/31/2015   HDL 33 (A) 08/31/2015   LDLCALC 99 08/31/2015   TRIG 116 08/31/2015    Significant Diagnostic Results in last 30 days:  No results found.  Assessment/Plan  1) UTI Improved, no further purulent urine or blood Continue levquin to complete 7 day course  2) Lobar pneumonia RLL , possible aspiration Continue antibiotic course Improved condition  3) ARF Improved Continue oral fluid push q 2 hrs while awake for 72 hrs  Family/ staff Communication: discussed with resident's daughter Leodis [place] ordered: weekly BMP

## 2017-07-03 DIAGNOSIS — D649 Anemia, unspecified: Secondary | ICD-10-CM | POA: Diagnosis not present

## 2017-07-03 DIAGNOSIS — G309 Alzheimer's disease, unspecified: Secondary | ICD-10-CM | POA: Diagnosis not present

## 2017-07-03 DIAGNOSIS — I679 Cerebrovascular disease, unspecified: Secondary | ICD-10-CM | POA: Diagnosis not present

## 2017-07-03 DIAGNOSIS — I5020 Unspecified systolic (congestive) heart failure: Secondary | ICD-10-CM | POA: Diagnosis not present

## 2017-07-03 DIAGNOSIS — I4891 Unspecified atrial fibrillation: Secondary | ICD-10-CM | POA: Diagnosis not present

## 2017-07-03 DIAGNOSIS — I25810 Atherosclerosis of coronary artery bypass graft(s) without angina pectoris: Secondary | ICD-10-CM | POA: Diagnosis not present

## 2017-07-03 DIAGNOSIS — N183 Chronic kidney disease, stage 3 (moderate): Secondary | ICD-10-CM | POA: Diagnosis not present

## 2017-07-03 DIAGNOSIS — I509 Heart failure, unspecified: Secondary | ICD-10-CM | POA: Diagnosis not present

## 2017-07-08 DIAGNOSIS — G309 Alzheimer's disease, unspecified: Secondary | ICD-10-CM | POA: Diagnosis not present

## 2017-07-08 DIAGNOSIS — I25810 Atherosclerosis of coronary artery bypass graft(s) without angina pectoris: Secondary | ICD-10-CM | POA: Diagnosis not present

## 2017-07-08 DIAGNOSIS — I4891 Unspecified atrial fibrillation: Secondary | ICD-10-CM | POA: Diagnosis not present

## 2017-07-08 DIAGNOSIS — N183 Chronic kidney disease, stage 3 (moderate): Secondary | ICD-10-CM | POA: Diagnosis not present

## 2017-07-08 DIAGNOSIS — M2450 Contracture, unspecified joint: Secondary | ICD-10-CM | POA: Diagnosis not present

## 2017-07-08 DIAGNOSIS — R1310 Dysphagia, unspecified: Secondary | ICD-10-CM | POA: Diagnosis not present

## 2017-07-08 DIAGNOSIS — E785 Hyperlipidemia, unspecified: Secondary | ICD-10-CM | POA: Diagnosis not present

## 2017-07-08 DIAGNOSIS — K219 Gastro-esophageal reflux disease without esophagitis: Secondary | ICD-10-CM | POA: Diagnosis not present

## 2017-07-08 DIAGNOSIS — I509 Heart failure, unspecified: Secondary | ICD-10-CM | POA: Diagnosis not present

## 2017-07-08 DIAGNOSIS — N401 Enlarged prostate with lower urinary tract symptoms: Secondary | ICD-10-CM | POA: Diagnosis not present

## 2017-07-08 DIAGNOSIS — I679 Cerebrovascular disease, unspecified: Secondary | ICD-10-CM | POA: Diagnosis not present

## 2017-07-09 DIAGNOSIS — I509 Heart failure, unspecified: Secondary | ICD-10-CM | POA: Diagnosis not present

## 2017-07-09 DIAGNOSIS — N183 Chronic kidney disease, stage 3 (moderate): Secondary | ICD-10-CM | POA: Diagnosis not present

## 2017-07-09 DIAGNOSIS — I4891 Unspecified atrial fibrillation: Secondary | ICD-10-CM | POA: Diagnosis not present

## 2017-07-09 DIAGNOSIS — I25810 Atherosclerosis of coronary artery bypass graft(s) without angina pectoris: Secondary | ICD-10-CM | POA: Diagnosis not present

## 2017-07-09 DIAGNOSIS — I679 Cerebrovascular disease, unspecified: Secondary | ICD-10-CM | POA: Diagnosis not present

## 2017-07-09 DIAGNOSIS — G309 Alzheimer's disease, unspecified: Secondary | ICD-10-CM | POA: Diagnosis not present

## 2017-07-10 DIAGNOSIS — D649 Anemia, unspecified: Secondary | ICD-10-CM | POA: Diagnosis not present

## 2017-07-10 DIAGNOSIS — I5020 Unspecified systolic (congestive) heart failure: Secondary | ICD-10-CM | POA: Diagnosis not present

## 2017-07-17 DIAGNOSIS — I679 Cerebrovascular disease, unspecified: Secondary | ICD-10-CM | POA: Diagnosis not present

## 2017-07-17 DIAGNOSIS — N183 Chronic kidney disease, stage 3 (moderate): Secondary | ICD-10-CM | POA: Diagnosis not present

## 2017-07-17 DIAGNOSIS — I5020 Unspecified systolic (congestive) heart failure: Secondary | ICD-10-CM | POA: Diagnosis not present

## 2017-07-17 DIAGNOSIS — D649 Anemia, unspecified: Secondary | ICD-10-CM | POA: Diagnosis not present

## 2017-07-17 DIAGNOSIS — G309 Alzheimer's disease, unspecified: Secondary | ICD-10-CM | POA: Diagnosis not present

## 2017-07-17 DIAGNOSIS — I25810 Atherosclerosis of coronary artery bypass graft(s) without angina pectoris: Secondary | ICD-10-CM | POA: Diagnosis not present

## 2017-07-17 DIAGNOSIS — I4891 Unspecified atrial fibrillation: Secondary | ICD-10-CM | POA: Diagnosis not present

## 2017-07-17 DIAGNOSIS — I509 Heart failure, unspecified: Secondary | ICD-10-CM | POA: Diagnosis not present

## 2017-07-23 DIAGNOSIS — I679 Cerebrovascular disease, unspecified: Secondary | ICD-10-CM | POA: Diagnosis not present

## 2017-07-23 DIAGNOSIS — I4891 Unspecified atrial fibrillation: Secondary | ICD-10-CM | POA: Diagnosis not present

## 2017-07-23 DIAGNOSIS — N183 Chronic kidney disease, stage 3 (moderate): Secondary | ICD-10-CM | POA: Diagnosis not present

## 2017-07-23 DIAGNOSIS — G309 Alzheimer's disease, unspecified: Secondary | ICD-10-CM | POA: Diagnosis not present

## 2017-07-23 DIAGNOSIS — I25810 Atherosclerosis of coronary artery bypass graft(s) without angina pectoris: Secondary | ICD-10-CM | POA: Diagnosis not present

## 2017-07-23 DIAGNOSIS — I509 Heart failure, unspecified: Secondary | ICD-10-CM | POA: Diagnosis not present

## 2017-07-24 DIAGNOSIS — D649 Anemia, unspecified: Secondary | ICD-10-CM | POA: Diagnosis not present

## 2017-07-24 DIAGNOSIS — I5020 Unspecified systolic (congestive) heart failure: Secondary | ICD-10-CM | POA: Diagnosis not present

## 2017-07-31 DIAGNOSIS — N183 Chronic kidney disease, stage 3 (moderate): Secondary | ICD-10-CM | POA: Diagnosis not present

## 2017-07-31 DIAGNOSIS — D649 Anemia, unspecified: Secondary | ICD-10-CM | POA: Diagnosis not present

## 2017-07-31 DIAGNOSIS — G309 Alzheimer's disease, unspecified: Secondary | ICD-10-CM | POA: Diagnosis not present

## 2017-07-31 DIAGNOSIS — I679 Cerebrovascular disease, unspecified: Secondary | ICD-10-CM | POA: Diagnosis not present

## 2017-07-31 DIAGNOSIS — I4891 Unspecified atrial fibrillation: Secondary | ICD-10-CM | POA: Diagnosis not present

## 2017-07-31 DIAGNOSIS — I25810 Atherosclerosis of coronary artery bypass graft(s) without angina pectoris: Secondary | ICD-10-CM | POA: Diagnosis not present

## 2017-07-31 DIAGNOSIS — I509 Heart failure, unspecified: Secondary | ICD-10-CM | POA: Diagnosis not present

## 2017-07-31 DIAGNOSIS — I5020 Unspecified systolic (congestive) heart failure: Secondary | ICD-10-CM | POA: Diagnosis not present

## 2017-07-31 LAB — BASIC METABOLIC PANEL
BUN: 27 — AB (ref 4–21)
Creatinine: 1 (ref 0.6–1.3)
GLUCOSE: 107
POTASSIUM: 4.6 (ref 3.4–5.3)
Sodium: 142 (ref 137–147)

## 2017-08-04 ENCOUNTER — Non-Acute Institutional Stay: Payer: Medicare Other | Admitting: Adult Health

## 2017-08-04 ENCOUNTER — Encounter: Payer: Self-pay | Admitting: Adult Health

## 2017-08-04 DIAGNOSIS — R8281 Pyuria: Secondary | ICD-10-CM

## 2017-08-04 DIAGNOSIS — I482 Chronic atrial fibrillation: Secondary | ICD-10-CM

## 2017-08-04 DIAGNOSIS — N390 Urinary tract infection, site not specified: Secondary | ICD-10-CM | POA: Diagnosis not present

## 2017-08-04 DIAGNOSIS — R634 Abnormal weight loss: Secondary | ICD-10-CM | POA: Diagnosis not present

## 2017-08-04 DIAGNOSIS — K5901 Slow transit constipation: Secondary | ICD-10-CM | POA: Diagnosis not present

## 2017-08-04 DIAGNOSIS — I509 Heart failure, unspecified: Secondary | ICD-10-CM | POA: Diagnosis not present

## 2017-08-04 DIAGNOSIS — I679 Cerebrovascular disease, unspecified: Secondary | ICD-10-CM | POA: Diagnosis not present

## 2017-08-04 DIAGNOSIS — I25810 Atherosclerosis of coronary artery bypass graft(s) without angina pectoris: Secondary | ICD-10-CM | POA: Diagnosis not present

## 2017-08-04 DIAGNOSIS — F02818 Dementia in other diseases classified elsewhere, unspecified severity, with other behavioral disturbance: Secondary | ICD-10-CM

## 2017-08-04 DIAGNOSIS — G301 Alzheimer's disease with late onset: Secondary | ICD-10-CM | POA: Diagnosis not present

## 2017-08-04 DIAGNOSIS — I872 Venous insufficiency (chronic) (peripheral): Secondary | ICD-10-CM

## 2017-08-04 DIAGNOSIS — F0281 Dementia in other diseases classified elsewhere with behavioral disturbance: Secondary | ICD-10-CM

## 2017-08-04 DIAGNOSIS — R339 Retention of urine, unspecified: Secondary | ICD-10-CM | POA: Diagnosis not present

## 2017-08-04 DIAGNOSIS — I4891 Unspecified atrial fibrillation: Secondary | ICD-10-CM | POA: Diagnosis not present

## 2017-08-04 DIAGNOSIS — I5032 Chronic diastolic (congestive) heart failure: Secondary | ICD-10-CM | POA: Diagnosis not present

## 2017-08-04 DIAGNOSIS — I4821 Permanent atrial fibrillation: Secondary | ICD-10-CM

## 2017-08-04 DIAGNOSIS — G309 Alzheimer's disease, unspecified: Secondary | ICD-10-CM | POA: Diagnosis not present

## 2017-08-04 DIAGNOSIS — N183 Chronic kidney disease, stage 3 (moderate): Secondary | ICD-10-CM | POA: Diagnosis not present

## 2017-08-04 NOTE — Progress Notes (Signed)
Location:  Occupational psychologist of Service:  ALF (13) Provider:   Cindi Carbon, ANP [place] [phone]   [name], DO  Patient Care Team: [name], DO as PCP - General (Geriatric Medicine) Community, [facility] Angelique Holm, Margreta Journey, NP as Nurse Practitioner (Nurse Practitioner)  Extended Emergency Contact Information Primary Emergency Contact: [name] Address: [address]          [place], [place] [address] [place] Phone: [phone] Relation: Spouse Secondary Emergency Contact: [name], [place] Phone: [phone] Relation: Daughter  Code Status:  DNR Goals of care: Advanced Directive information Advanced Directives 06/23/2017  Does Patient Have a Medical Advance Directive? Yes  Type of Paramedic of [place];Living will;Out of facility DNR (pink MOST or yellow form)  Does patient want to make changes to medical advance directive? -  Copy of [place] in Chart? Yes  Pre-existing out of facility DNR order (yellow form or pink MOST form) Pink MOST form placed in chart (order not valid for inpatient use);Yellow form placed in chart (order not valid for inpatient use)     Chief Complaint  Patient presents with  . Acute Visit    purulent drainage  . Medical Management of Chronic Issues    HPI:  Pt is a 84 y.o. male seen today for purulent drainage from the catheter and medical management of chronic diseases.  He resides in memory care due to advanced AD  Urinary retention: has a chronic foley with frequent infections and hypospadias Staff report purulent drainage from the meatus and redness as well as thick purulent urine in the foley bag. No fever or discomfort. There were issues with low urine output but after changing to a 18 Fr cath there was improvement.   AD: Fast stage 7,  off memory meds  Weight loss: followed by hospice Takes boost three times a day and his caregiver wonders if this is causing diarrhea Wt Readings from Last 3 Encounters:  08/04/17 175 lb (79.4 kg)  06/27/17 177 lb (80.3 kg)  06/23/17 178 lb (80.7 kg)   Abd distention and constipation: on dulcolax qod due to issues with chronic constipation and difficulty passing gas. He has needed a rectal tube in the past.  Caregiver reports loose stools regularly that are difficult to clean up.  CHF: weight trending down, no sob or cp  AFIB: remains on eliquis, rate controlled  Cerebrovascular disease: off statin due to goals of care and lack of benenfit Previous hx of CVA RMCA  Venous insuff: wears compression hose due to chronic edema in the lower ext  Past Medical History:  Diagnosis Date  . Allergic rhinitis   . Alzheimer's disease 2013  . Atrial fibrillation ([place])    Long term anti coagulation w/ warfarin fro stroke risk reduction  . BPH (benign prostatic hyperplasia)   . Chronic venous insufficiency 01/12/2013  . Dementia with behavioral disturbance 12/29/13  . Gait disorder   . GERD (gastroesophageal reflux disease)   . History of CVA (cerebrovascular accident) 2001   Rt.MCA  . Hyperlipidemia   . Lactose intolerance   . Long term (current) use of anticoagulants 11/23/2012   Long-term anticoagulation with warfarin for stroke risk reduction related to AF   Past Surgical History:  Procedure Laterality Date  . CATARACT EXTRACTION W/ INTRAOCULAR LENS  IMPLANT,  BILATERAL    . HERNIA REPAIR Right 1970s  . KNEE SURGERY Right 2000  . MASS EXCISION Left 07/30/2013   Procedure: EXCISION OF BASAL CELL CARCINOMA  LEFT FOREHEAD ;  Surgeon: Irene Limbo, MD;  Location: [facility];  Service: Plastics;  Laterality: Left;  . RETINAL DETACHMENT SURGERY Right 2009    Allergies  Allergen Reactions  . Penicillins Rash  . Bee Venom   . Risperidone And Related     Unstable gait     Outpatient Encounter Medications as of 08/04/2017  Medication Sig  . apixaban (ELIQUIS) 2.5 MG TABS tablet Take 2.5 mg by mouth 2 (two) times daily.  . bisacodyl (DULCOLAX) 10 MG suppository Place 10 mg rectally every other day.  . ipratropium-albuterol (DUONEB) 0.5-2.5 (3) MG/3ML SOLN Take 3 mLs by nebulization every 6 (six) hours as needed (for wheezing and cough). q 6 hrs prn  . lactose free nutrition (BOOST) LIQD Take 237 mLs by mouth 3 (three) times daily between meals.   . nitroGLYCERIN (NITROSTAT) 0.4 MG SL tablet Place 0.4 mg under the tongue every 5 (five) minutes as needed for chest pain.  . potassium chloride SA (K-DUR,KLOR-CON) 20 MEQ tablet Take 40 mEq by mouth daily.  . [DISCONTINUED] levofloxacin (LEVAQUIN) 500 MG tablet Take 500 mg by mouth daily. 500 mg on day 1 then 250 mg qd for 6 more days  . [DISCONTINUED] saccharomyces boulardii (FLORASTOR) 250 MG capsule Take 250 mg by mouth 2 (two) times daily. For 7 days   No facility-administered encounter medications on file as of 08/04/2017.     Review of Systems  Unable to perform ROS: Dementia    Immunization History  Administered Date(s) Administered  . Influenza Inj Mdck Quad Pf 04/25/2016  . Influenza Whole 04/20/2013  . Influenza-Unspecified 04/12/2014, 04/20/2015, 05/01/2017  . PPD Test 11/29/2012  . Pneumococcal Conjugate-13 08/01/2016   Pertinent  Health Maintenance Due  Topic Date Due  . PNA vac Low Risk Adult (2 of 2 - PPSV23) 08/01/2017  . INFLUENZA VACCINE  Completed   Fall Risk  05/28/2017 06/21/2016  Falls in the past year? No No   Functional Status Survey:    Vitals:   08/04/17 1626  BP: 126/70  Pulse: 84  Resp: 16  Temp: (!) 96.5 F (35.8 C)  SpO2: 97%  Weight: 175 lb (79.4 kg)   Body mass index is 23.73 kg/m. Physical Exam  Constitutional: No distress.  frail  HENT:  Head: Normocephalic and atraumatic.  Neck: Normal range of motion. Neck supple. No JVD present. No tracheal  deviation present. No thyromegaly present.  Cardiovascular: Normal rate.  No murmur heard. Irregular BLE edema +1  Pulmonary/Chest: Effort normal and breath sounds normal. No respiratory distress. He has no wheezes.  Abdominal: Soft. Bowel sounds are normal. He exhibits no distension. There is no tenderness.  Genitourinary: No penile tenderness.  Genitourinary Comments: Hypospadias noted with erythema at the site and purulent drainage. Purulent urine in foley bag.  Lymphadenopathy:    He has no cervical adenopathy.  Neurological: He is alert.  Not able to follow commands and minimally verbal. Resists assessment and attempts to pull at cath  Skin: Skin is warm and dry. He is not diaphoretic.  Psychiatric: He has a normal mood and affect.    Labs reviewed: Recent Labs    04/24/17 0600 06/23/17 06/26/17 07/31/17  NA 139 146 147 142  K 4.2 4.4 4.0 4.6  BUN 26* 33* 27* 27*  CREATININE 1.0  --  1.0 1.0   No results for input(s): AST, ALT, ALKPHOS, BILITOT, PROT, ALBUMIN in the last 8760 hours. Recent Labs    09/30/16 0630 11/26/16 06/23/17  WBC 6.7 7.6 16.2  HGB 12.4* 12.5* 13.9  HCT 38* 37* 44  PLT 203 155 240   Lab Results  Component Value Date   TSH 5.05 08/08/2016   No results found for: HGBA1C Lab Results  Component Value Date   CHOL 155 08/31/2015   HDL 33 (A) 08/31/2015   LDLCALC 99 08/31/2015   TRIG 116 08/31/2015    Significant Diagnostic Results in last 30 days:  No results found.  Assessment/Plan  Purulent urine: significant purulent drainage coming from the penis and catheter bag. Check UA C and S as his family wants [place] to continue to treat infections. He is at high risk due to the chronic foley.   Diastolic CHF ([place]) Continue to monitor weight. Goals of care are comfort based.   Chronic venous insufficiency Continue compression hose and elevation.   Atrial fibrillation Continue eliquis for CVA risk reduction. His family is insistent on keeping this  medication on board but at this point in his dementia he is most likely does not benefit.  Alzheimer's disease Progressive decline in cognition and function, now dependent on staff for all ADL's. Followed by hospice.   Urinary retention Continue 18 gauge foley cath which has helped with output. Change foley per facility protocol.  Loss of weight Due to dysphagia and advancing dementia.  Constipation Will change dulcolax to qd prn constipation or distention due to loose stools.  No reports of nausea or abd pain.  Also decrease boost to 1 can qd and prn and see if this helps with the loose stools.  .   Family/ staff Communication: discussed with the nurse  Labs/tests ordered: UA C and S

## 2017-08-05 DIAGNOSIS — N400 Enlarged prostate without lower urinary tract symptoms: Secondary | ICD-10-CM | POA: Diagnosis not present

## 2017-08-05 DIAGNOSIS — N183 Chronic kidney disease, stage 3 (moderate): Secondary | ICD-10-CM | POA: Diagnosis not present

## 2017-08-05 DIAGNOSIS — R339 Retention of urine, unspecified: Secondary | ICD-10-CM | POA: Diagnosis not present

## 2017-08-05 DIAGNOSIS — E708 Other disorders of aromatic amino-acid metabolism: Secondary | ICD-10-CM | POA: Diagnosis not present

## 2017-08-05 DIAGNOSIS — N390 Urinary tract infection, site not specified: Secondary | ICD-10-CM | POA: Diagnosis not present

## 2017-08-05 DIAGNOSIS — R82998 Other abnormal findings in urine: Secondary | ICD-10-CM | POA: Diagnosis not present

## 2017-08-05 NOTE — Assessment & Plan Note (Signed)
Due to dysphagia and advancing dementia.

## 2017-08-05 NOTE — Assessment & Plan Note (Signed)
Will change dulcolax to qd prn constipation or distention due to loose stools.  No reports of nausea or abd pain.  Also decrease boost to 1 can qd and prn and see if this helps with the loose stools.

## 2017-08-05 NOTE — Assessment & Plan Note (Signed)
Continue compression hose and elevation.

## 2017-08-05 NOTE — Assessment & Plan Note (Signed)
Continue eliquis for CVA risk reduction. His family is insistent on keeping this medication on board but at this point in his dementia he is most likely does not benefit.

## 2017-08-05 NOTE — Assessment & Plan Note (Signed)
Continue to monitor weight. Goals of care are comfort based.

## 2017-08-05 NOTE — Assessment & Plan Note (Signed)
Continue 18 gauge foley cath which has helped with output. Change foley per facility protocol.

## 2017-08-05 NOTE — Assessment & Plan Note (Signed)
Progressive decline in cognition and function, now dependent on staff for all ADL's. Followed by hospice.

## 2017-08-07 DIAGNOSIS — D649 Anemia, unspecified: Secondary | ICD-10-CM | POA: Diagnosis not present

## 2017-08-07 DIAGNOSIS — I509 Heart failure, unspecified: Secondary | ICD-10-CM | POA: Diagnosis not present

## 2017-08-07 DIAGNOSIS — I5020 Unspecified systolic (congestive) heart failure: Secondary | ICD-10-CM | POA: Diagnosis not present

## 2017-08-07 DIAGNOSIS — I679 Cerebrovascular disease, unspecified: Secondary | ICD-10-CM | POA: Diagnosis not present

## 2017-08-07 DIAGNOSIS — I4891 Unspecified atrial fibrillation: Secondary | ICD-10-CM | POA: Diagnosis not present

## 2017-08-07 DIAGNOSIS — I25810 Atherosclerosis of coronary artery bypass graft(s) without angina pectoris: Secondary | ICD-10-CM | POA: Diagnosis not present

## 2017-08-07 DIAGNOSIS — N183 Chronic kidney disease, stage 3 (moderate): Secondary | ICD-10-CM | POA: Diagnosis not present

## 2017-08-07 DIAGNOSIS — G309 Alzheimer's disease, unspecified: Secondary | ICD-10-CM | POA: Diagnosis not present

## 2017-08-07 LAB — BASIC METABOLIC PANEL
BUN: 28 — AB (ref 4–21)
Creatinine: 1.1 (ref 0.6–1.3)
Glucose: 116
Potassium: 4.2 (ref 3.4–5.3)
Sodium: 141 (ref 137–147)

## 2017-08-08 DIAGNOSIS — K219 Gastro-esophageal reflux disease without esophagitis: Secondary | ICD-10-CM | POA: Diagnosis not present

## 2017-08-08 DIAGNOSIS — G309 Alzheimer's disease, unspecified: Secondary | ICD-10-CM | POA: Diagnosis not present

## 2017-08-08 DIAGNOSIS — N401 Enlarged prostate with lower urinary tract symptoms: Secondary | ICD-10-CM | POA: Diagnosis not present

## 2017-08-08 DIAGNOSIS — N183 Chronic kidney disease, stage 3 (moderate): Secondary | ICD-10-CM | POA: Diagnosis not present

## 2017-08-08 DIAGNOSIS — R1310 Dysphagia, unspecified: Secondary | ICD-10-CM | POA: Diagnosis not present

## 2017-08-08 DIAGNOSIS — I4891 Unspecified atrial fibrillation: Secondary | ICD-10-CM | POA: Diagnosis not present

## 2017-08-08 DIAGNOSIS — I679 Cerebrovascular disease, unspecified: Secondary | ICD-10-CM | POA: Diagnosis not present

## 2017-08-08 DIAGNOSIS — M2450 Contracture, unspecified joint: Secondary | ICD-10-CM | POA: Diagnosis not present

## 2017-08-08 DIAGNOSIS — I509 Heart failure, unspecified: Secondary | ICD-10-CM | POA: Diagnosis not present

## 2017-08-08 DIAGNOSIS — I25810 Atherosclerosis of coronary artery bypass graft(s) without angina pectoris: Secondary | ICD-10-CM | POA: Diagnosis not present

## 2017-08-08 DIAGNOSIS — E785 Hyperlipidemia, unspecified: Secondary | ICD-10-CM | POA: Diagnosis not present

## 2017-08-14 DIAGNOSIS — I5020 Unspecified systolic (congestive) heart failure: Secondary | ICD-10-CM | POA: Diagnosis not present

## 2017-08-14 DIAGNOSIS — D649 Anemia, unspecified: Secondary | ICD-10-CM | POA: Diagnosis not present

## 2017-08-14 LAB — BASIC METABOLIC PANEL
BUN: 22 — AB (ref 4–21)
CREATININE: 1.2 (ref 0.6–1.3)
Glucose: 91
Potassium: 4.4 (ref 3.4–5.3)
Sodium: 136 — AB (ref 137–147)

## 2017-08-15 DIAGNOSIS — G309 Alzheimer's disease, unspecified: Secondary | ICD-10-CM | POA: Diagnosis not present

## 2017-08-15 DIAGNOSIS — I4891 Unspecified atrial fibrillation: Secondary | ICD-10-CM | POA: Diagnosis not present

## 2017-08-15 DIAGNOSIS — I25810 Atherosclerosis of coronary artery bypass graft(s) without angina pectoris: Secondary | ICD-10-CM | POA: Diagnosis not present

## 2017-08-15 DIAGNOSIS — N183 Chronic kidney disease, stage 3 (moderate): Secondary | ICD-10-CM | POA: Diagnosis not present

## 2017-08-15 DIAGNOSIS — I679 Cerebrovascular disease, unspecified: Secondary | ICD-10-CM | POA: Diagnosis not present

## 2017-08-15 DIAGNOSIS — I509 Heart failure, unspecified: Secondary | ICD-10-CM | POA: Diagnosis not present

## 2017-08-19 DIAGNOSIS — G309 Alzheimer's disease, unspecified: Secondary | ICD-10-CM | POA: Diagnosis not present

## 2017-08-19 DIAGNOSIS — I509 Heart failure, unspecified: Secondary | ICD-10-CM | POA: Diagnosis not present

## 2017-08-19 DIAGNOSIS — I4891 Unspecified atrial fibrillation: Secondary | ICD-10-CM | POA: Diagnosis not present

## 2017-08-19 DIAGNOSIS — I25810 Atherosclerosis of coronary artery bypass graft(s) without angina pectoris: Secondary | ICD-10-CM | POA: Diagnosis not present

## 2017-08-19 DIAGNOSIS — N183 Chronic kidney disease, stage 3 (moderate): Secondary | ICD-10-CM | POA: Diagnosis not present

## 2017-08-19 DIAGNOSIS — I679 Cerebrovascular disease, unspecified: Secondary | ICD-10-CM | POA: Diagnosis not present

## 2017-08-21 DIAGNOSIS — I4891 Unspecified atrial fibrillation: Secondary | ICD-10-CM | POA: Diagnosis not present

## 2017-08-21 DIAGNOSIS — I5020 Unspecified systolic (congestive) heart failure: Secondary | ICD-10-CM | POA: Diagnosis not present

## 2017-08-21 DIAGNOSIS — D649 Anemia, unspecified: Secondary | ICD-10-CM | POA: Diagnosis not present

## 2017-08-21 DIAGNOSIS — I25810 Atherosclerosis of coronary artery bypass graft(s) without angina pectoris: Secondary | ICD-10-CM | POA: Diagnosis not present

## 2017-08-21 DIAGNOSIS — I679 Cerebrovascular disease, unspecified: Secondary | ICD-10-CM | POA: Diagnosis not present

## 2017-08-21 DIAGNOSIS — I509 Heart failure, unspecified: Secondary | ICD-10-CM | POA: Diagnosis not present

## 2017-08-21 DIAGNOSIS — G309 Alzheimer's disease, unspecified: Secondary | ICD-10-CM | POA: Diagnosis not present

## 2017-08-21 DIAGNOSIS — N183 Chronic kidney disease, stage 3 (moderate): Secondary | ICD-10-CM | POA: Diagnosis not present

## 2017-08-21 LAB — BASIC METABOLIC PANEL
BUN: 20 (ref 4–21)
Creatinine: 1.2 (ref 0.6–1.3)
Glucose: 108
POTASSIUM: 4.6 (ref 3.4–5.3)
Sodium: 139 (ref 137–147)

## 2017-08-28 DIAGNOSIS — N183 Chronic kidney disease, stage 3 (moderate): Secondary | ICD-10-CM | POA: Diagnosis not present

## 2017-08-28 DIAGNOSIS — I679 Cerebrovascular disease, unspecified: Secondary | ICD-10-CM | POA: Diagnosis not present

## 2017-08-28 DIAGNOSIS — I509 Heart failure, unspecified: Secondary | ICD-10-CM | POA: Diagnosis not present

## 2017-08-28 DIAGNOSIS — D649 Anemia, unspecified: Secondary | ICD-10-CM | POA: Diagnosis not present

## 2017-08-28 DIAGNOSIS — I25810 Atherosclerosis of coronary artery bypass graft(s) without angina pectoris: Secondary | ICD-10-CM | POA: Diagnosis not present

## 2017-08-28 DIAGNOSIS — I4891 Unspecified atrial fibrillation: Secondary | ICD-10-CM | POA: Diagnosis not present

## 2017-08-28 DIAGNOSIS — I5020 Unspecified systolic (congestive) heart failure: Secondary | ICD-10-CM | POA: Diagnosis not present

## 2017-08-28 DIAGNOSIS — G309 Alzheimer's disease, unspecified: Secondary | ICD-10-CM | POA: Diagnosis not present

## 2017-08-28 LAB — BASIC METABOLIC PANEL
BUN: 15 (ref 4–21)
Creatinine: 0.9 (ref 0.6–1.3)
Glucose: 87
Potassium: 4.6 (ref 3.4–5.3)
SODIUM: 141 (ref 137–147)

## 2017-09-02 DIAGNOSIS — I25810 Atherosclerosis of coronary artery bypass graft(s) without angina pectoris: Secondary | ICD-10-CM | POA: Diagnosis not present

## 2017-09-02 DIAGNOSIS — I4891 Unspecified atrial fibrillation: Secondary | ICD-10-CM | POA: Diagnosis not present

## 2017-09-02 DIAGNOSIS — G309 Alzheimer's disease, unspecified: Secondary | ICD-10-CM | POA: Diagnosis not present

## 2017-09-02 DIAGNOSIS — I679 Cerebrovascular disease, unspecified: Secondary | ICD-10-CM | POA: Diagnosis not present

## 2017-09-02 DIAGNOSIS — N183 Chronic kidney disease, stage 3 (moderate): Secondary | ICD-10-CM | POA: Diagnosis not present

## 2017-09-02 DIAGNOSIS — I509 Heart failure, unspecified: Secondary | ICD-10-CM | POA: Diagnosis not present

## 2017-09-03 DIAGNOSIS — I4891 Unspecified atrial fibrillation: Secondary | ICD-10-CM | POA: Diagnosis not present

## 2017-09-03 DIAGNOSIS — N183 Chronic kidney disease, stage 3 (moderate): Secondary | ICD-10-CM | POA: Diagnosis not present

## 2017-09-03 DIAGNOSIS — I509 Heart failure, unspecified: Secondary | ICD-10-CM | POA: Diagnosis not present

## 2017-09-03 DIAGNOSIS — G309 Alzheimer's disease, unspecified: Secondary | ICD-10-CM | POA: Diagnosis not present

## 2017-09-03 DIAGNOSIS — I25810 Atherosclerosis of coronary artery bypass graft(s) without angina pectoris: Secondary | ICD-10-CM | POA: Diagnosis not present

## 2017-09-03 DIAGNOSIS — I679 Cerebrovascular disease, unspecified: Secondary | ICD-10-CM | POA: Diagnosis not present

## 2017-09-04 DIAGNOSIS — D649 Anemia, unspecified: Secondary | ICD-10-CM | POA: Diagnosis not present

## 2017-09-04 DIAGNOSIS — I5020 Unspecified systolic (congestive) heart failure: Secondary | ICD-10-CM | POA: Diagnosis not present

## 2017-09-04 LAB — BASIC METABOLIC PANEL
BUN: 18 (ref 4–21)
CREATININE: 0.9 (ref 0.6–1.3)
GLUCOSE: 97
Potassium: 4.2 (ref 3.4–5.3)
Sodium: 139 (ref 137–147)

## 2017-09-05 DIAGNOSIS — I4891 Unspecified atrial fibrillation: Secondary | ICD-10-CM | POA: Diagnosis not present

## 2017-09-05 DIAGNOSIS — I509 Heart failure, unspecified: Secondary | ICD-10-CM | POA: Diagnosis not present

## 2017-09-05 DIAGNOSIS — I25810 Atherosclerosis of coronary artery bypass graft(s) without angina pectoris: Secondary | ICD-10-CM | POA: Diagnosis not present

## 2017-09-05 DIAGNOSIS — G309 Alzheimer's disease, unspecified: Secondary | ICD-10-CM | POA: Diagnosis not present

## 2017-09-05 DIAGNOSIS — M2450 Contracture, unspecified joint: Secondary | ICD-10-CM | POA: Diagnosis not present

## 2017-09-05 DIAGNOSIS — N183 Chronic kidney disease, stage 3 (moderate): Secondary | ICD-10-CM | POA: Diagnosis not present

## 2017-09-05 DIAGNOSIS — E785 Hyperlipidemia, unspecified: Secondary | ICD-10-CM | POA: Diagnosis not present

## 2017-09-05 DIAGNOSIS — N401 Enlarged prostate with lower urinary tract symptoms: Secondary | ICD-10-CM | POA: Diagnosis not present

## 2017-09-05 DIAGNOSIS — K219 Gastro-esophageal reflux disease without esophagitis: Secondary | ICD-10-CM | POA: Diagnosis not present

## 2017-09-05 DIAGNOSIS — I679 Cerebrovascular disease, unspecified: Secondary | ICD-10-CM | POA: Diagnosis not present

## 2017-09-05 DIAGNOSIS — R1310 Dysphagia, unspecified: Secondary | ICD-10-CM | POA: Diagnosis not present

## 2017-09-09 DIAGNOSIS — I509 Heart failure, unspecified: Secondary | ICD-10-CM | POA: Diagnosis not present

## 2017-09-09 DIAGNOSIS — G309 Alzheimer's disease, unspecified: Secondary | ICD-10-CM | POA: Diagnosis not present

## 2017-09-09 DIAGNOSIS — I25810 Atherosclerosis of coronary artery bypass graft(s) without angina pectoris: Secondary | ICD-10-CM | POA: Diagnosis not present

## 2017-09-09 DIAGNOSIS — N183 Chronic kidney disease, stage 3 (moderate): Secondary | ICD-10-CM | POA: Diagnosis not present

## 2017-09-09 DIAGNOSIS — I679 Cerebrovascular disease, unspecified: Secondary | ICD-10-CM | POA: Diagnosis not present

## 2017-09-09 DIAGNOSIS — I4891 Unspecified atrial fibrillation: Secondary | ICD-10-CM | POA: Diagnosis not present

## 2017-09-11 DIAGNOSIS — I5020 Unspecified systolic (congestive) heart failure: Secondary | ICD-10-CM | POA: Diagnosis not present

## 2017-09-11 DIAGNOSIS — N183 Chronic kidney disease, stage 3 (moderate): Secondary | ICD-10-CM | POA: Diagnosis not present

## 2017-09-11 DIAGNOSIS — I25810 Atherosclerosis of coronary artery bypass graft(s) without angina pectoris: Secondary | ICD-10-CM | POA: Diagnosis not present

## 2017-09-11 DIAGNOSIS — G309 Alzheimer's disease, unspecified: Secondary | ICD-10-CM | POA: Diagnosis not present

## 2017-09-11 DIAGNOSIS — I509 Heart failure, unspecified: Secondary | ICD-10-CM | POA: Diagnosis not present

## 2017-09-11 DIAGNOSIS — D649 Anemia, unspecified: Secondary | ICD-10-CM | POA: Diagnosis not present

## 2017-09-11 DIAGNOSIS — I4891 Unspecified atrial fibrillation: Secondary | ICD-10-CM | POA: Diagnosis not present

## 2017-09-11 DIAGNOSIS — I679 Cerebrovascular disease, unspecified: Secondary | ICD-10-CM | POA: Diagnosis not present

## 2017-09-11 LAB — CBC AND DIFFERENTIAL
HCT: 46 (ref 41–53)
HEMOGLOBIN: 14.1 (ref 13.5–17.5)
PLATELETS: 212 (ref 150–399)
WBC: 7.2

## 2017-09-11 LAB — BASIC METABOLIC PANEL
BUN: 20 (ref 4–21)
Creatinine: 1.1 (ref 0.6–1.3)
Glucose: 101
Potassium: 4.7 (ref 3.4–5.3)
Sodium: 138 (ref 137–147)

## 2017-09-12 ENCOUNTER — Non-Acute Institutional Stay: Payer: Medicare Other | Admitting: Adult Health

## 2017-09-12 ENCOUNTER — Encounter: Payer: Self-pay | Admitting: Adult Health

## 2017-09-12 DIAGNOSIS — R059 Cough, unspecified: Secondary | ICD-10-CM

## 2017-09-12 DIAGNOSIS — G4483 Primary cough headache: Secondary | ICD-10-CM | POA: Diagnosis not present

## 2017-09-12 DIAGNOSIS — I25810 Atherosclerosis of coronary artery bypass graft(s) without angina pectoris: Secondary | ICD-10-CM | POA: Diagnosis not present

## 2017-09-12 DIAGNOSIS — F0281 Dementia in other diseases classified elsewhere with behavioral disturbance: Secondary | ICD-10-CM

## 2017-09-12 DIAGNOSIS — K5901 Slow transit constipation: Secondary | ICD-10-CM

## 2017-09-12 DIAGNOSIS — R05 Cough: Secondary | ICD-10-CM

## 2017-09-12 DIAGNOSIS — R339 Retention of urine, unspecified: Secondary | ICD-10-CM

## 2017-09-12 DIAGNOSIS — G309 Alzheimer's disease, unspecified: Secondary | ICD-10-CM | POA: Diagnosis not present

## 2017-09-12 DIAGNOSIS — I4891 Unspecified atrial fibrillation: Secondary | ICD-10-CM | POA: Diagnosis not present

## 2017-09-12 DIAGNOSIS — I482 Chronic atrial fibrillation: Secondary | ICD-10-CM | POA: Diagnosis not present

## 2017-09-12 DIAGNOSIS — I509 Heart failure, unspecified: Secondary | ICD-10-CM | POA: Diagnosis not present

## 2017-09-12 DIAGNOSIS — I679 Cerebrovascular disease, unspecified: Secondary | ICD-10-CM | POA: Diagnosis not present

## 2017-09-12 DIAGNOSIS — N183 Chronic kidney disease, stage 3 (moderate): Secondary | ICD-10-CM | POA: Diagnosis not present

## 2017-09-12 DIAGNOSIS — R1310 Dysphagia, unspecified: Secondary | ICD-10-CM

## 2017-09-12 DIAGNOSIS — F02818 Dementia in other diseases classified elsewhere, unspecified severity, with other behavioral disturbance: Secondary | ICD-10-CM

## 2017-09-12 DIAGNOSIS — I5032 Chronic diastolic (congestive) heart failure: Secondary | ICD-10-CM

## 2017-09-12 DIAGNOSIS — G301 Alzheimer's disease with late onset: Secondary | ICD-10-CM

## 2017-09-12 DIAGNOSIS — I4821 Permanent atrial fibrillation: Secondary | ICD-10-CM

## 2017-09-13 NOTE — Assessment & Plan Note (Signed)
Rate controlled without meds. Continues on eliquis for CVA risk reduction.

## 2017-09-13 NOTE — Assessment & Plan Note (Signed)
Controlled. Continue prn miralax and dulcolax supp

## 2017-09-13 NOTE — Assessment & Plan Note (Signed)
Continue catheter care management. Cleanse meatus with soapy water qshift.

## 2017-09-13 NOTE — Assessment & Plan Note (Signed)
Continue modified diet, asp prec Avoid hospitalizations, no feeding tubes.

## 2017-09-13 NOTE — Assessment & Plan Note (Signed)
Compensated without medications.

## 2017-09-13 NOTE — Assessment & Plan Note (Signed)
FAST 7.  No longer on meds due to lack of benefit. Followed by hospice.

## 2017-09-13 NOTE — Progress Notes (Signed)
Location:  Occupational psychologist of Service:  ALF (13) Provider:   Cindi Carbon, ANP [place] [phone]  [name], DO  Patient Care Team: [name], DO as PCP - General (Geriatric Medicine) Community, [facility] Holm, Margreta Journey, NP as Nurse Practitioner (Nurse Practitioner)  Extended Emergency Contact Information Primary Emergency Contact: [name] Address: [address]          [place], [place] [address] [place] Phone: [phone] Relation: Spouse Secondary Emergency Contact: [name], [place] Phone: [phone] Relation: Daughter  Code Status: DNR Goals of care: Advanced Directive information Advanced Directives 06/23/2017  Does Patient Have a Medical Advance Directive? Yes  Type of Paramedic of [place];Living will;Out of facility DNR (pink MOST or yellow form)  Does patient want to make changes to medical advance directive? -  Copy of [place] in Chart? Yes  Pre-existing out of facility DNR order (yellow form or pink MOST form) Pink MOST form placed in chart (order not valid for inpatient use);Yellow form placed in chart (order not valid for inpatient use)     Chief Complaint  Patient presents with  . Acute Visit    cough low grade temp  . Medical Management of Chronic Issues    HPI:  Pt is a 84 y.o. male seen today for acute visit for cough and low grade temp, as well as  medical management of chronic diseases.  He resides in memory care due advanced dementia. The staff report he has a congested cough present for 1 day, that sounds worse than his usual chronic cough due to dysphagia.  He had one temp of 100.5 last night but is now 99.  Sats are 95% on RA.  No sputum production or lethargy. He is alert for my visit, smiles and even followed one command.  He is followed by  hospice due to dysphagia, recurrent pneumonias, weight loss, and advancing dementia.  His weight has been rather stable lately at 175 lbs. No sob or chest pain.  He has a foley due to BPH and urinary retention. The staff report that it is draining well and that at times his urine is quite cloudy without hematuria.  He is tolerated a modified diet of puree food with HTL due to dysphagia, see note above on cough. Due to a hx of dehydration and rapid hypernatremia his family has requested BMP's every week. Hospice has attempted to work with them on this issue as we really are in a comfort care mode due to his advanced dementia and recurrent infections.  Past Medical History:  Diagnosis Date  . Allergic rhinitis   . Alzheimer's disease 2013  . Atrial fibrillation ([place])    Long term anti coagulation w/ warfarin fro stroke risk reduction  . BPH (benign prostatic hyperplasia)   . Chronic venous insufficiency 01/12/2013  . Dementia with behavioral disturbance 12/29/13  . Gait disorder   . GERD (gastroesophageal reflux disease)   . History of CVA (cerebrovascular accident) 2001   Rt.MCA  . Hyperlipidemia   . Lactose intolerance   . Long term (current) use of anticoagulants 11/23/2012   Long-term anticoagulation with warfarin for stroke risk reduction related to AF   Past Surgical History:  Procedure Laterality Date  . CATARACT EXTRACTION W/ INTRAOCULAR LENS  IMPLANT, BILATERAL    . HERNIA  REPAIR Right 1970s  . KNEE SURGERY Right 2000  . MASS EXCISION Left 07/30/2013   Procedure: EXCISION OF BASAL CELL CARCINOMA  LEFT FOREHEAD ;  Surgeon: Irene Limbo, MD;  Location: [facility];  Service: Plastics;  Laterality: Left;  . RETINAL DETACHMENT SURGERY Right 2009    Allergies  Allergen Reactions  . Penicillins Rash  . Bee Venom   . Risperidone And Related     Unstable gait    Outpatient Encounter Medications as of 09/12/2017  Medication Sig  . apixaban (ELIQUIS) 2.5 MG TABS  tablet Take 2.5 mg by mouth 2 (two) times daily.  . bisacodyl (DULCOLAX) 10 MG suppository Place 10 mg rectally every other day.  . ipratropium-albuterol (DUONEB) 0.5-2.5 (3) MG/3ML SOLN Take 3 mLs by nebulization every 6 (six) hours as needed (for wheezing and cough). q 6 hrs prn  . lactose free nutrition (BOOST) LIQD Take 237 mLs by mouth 3 (three) times daily between meals.   . nitroGLYCERIN (NITROSTAT) 0.4 MG SL tablet Place 0.4 mg under the tongue every 5 (five) minutes as needed for chest pain.  . polyethylene glycol (MIRALAX / GLYCOLAX) packet Take 17 g by mouth daily as needed.  . potassium chloride SA (K-DUR,KLOR-CON) 20 MEQ tablet Take 40 mEq by mouth daily.   No facility-administered encounter medications on file as of 09/12/2017.     Review of Systems  Unable to perform ROS: Dementia    Immunization History  Administered Date(s) Administered  . Influenza Inj Mdck Quad Pf 04/25/2016  . Influenza Whole 04/20/2013  . Influenza-Unspecified 04/12/2014, 04/20/2015, 05/01/2017  . PPD Test 11/29/2012  . Pneumococcal Conjugate-13 08/01/2016   Pertinent  Health Maintenance Due  Topic Date Due  . PNA vac Low Risk Adult (2 of 2 - PPSV23) 08/01/2017  . INFLUENZA VACCINE  Completed   Fall Risk  05/28/2017 06/21/2016  Falls in the past year? No No   Functional Status Survey:    Vitals:   09/12/17 1200  BP: 106/66  Pulse: 80  Resp: 20  Temp: 99 F (37.2 C)  SpO2: 95%  Weight: 175 lb (79.4 kg)   Body mass index is 23.73 kg/m. Physical Exam  Constitutional: No distress.  HENT:  Head: Normocephalic and atraumatic.  Right Ear: External ear normal.  Left Ear: External ear normal.  Nose: Nose normal.  Mouth/Throat: Oropharynx is clear and moist. No oropharyngeal exudate.  Neck: No JVD present. No tracheal deviation present.  Cardiovascular: Normal rate and regular rhythm.  No murmur heard. BLE edema +1  Pulmonary/Chest: Effort normal. No respiratory distress. He has no  wheezes.  Decreased bases  Abdominal: Soft. Bowel sounds are normal. He exhibits no distension. There is no tenderness.  Lymphadenopathy:    He has cervical adenopathy.  Neurological: He is alert. No cranial nerve deficit.  Minimally verbal, followed one command during my exam   Skin: Skin is warm and dry. He is not diaphoretic.  Psychiatric: He has a normal mood and affect.  Vitals reviewed.   Labs reviewed: Recent Labs    06/26/17 07/31/17 09/11/17  NA 147 142 138  K 4.0 4.6 4.7  BUN 27* 27* 20  CREATININE 1.0 1.0 1.1   No results for input(s): AST, ALT, ALKPHOS, BILITOT, PROT, ALBUMIN in the last 8760 hours. Recent Labs    11/26/16 06/23/17 09/11/17  WBC 7.6 16.2 7.2  HGB 12.5* 13.9 14.1  HCT 37* 44 46  PLT 155 240 212   Lab Results  Component  Value Date   TSH 5.05 08/08/2016   No results found for: HGBA1C Lab Results  Component Value Date   CHOL 155 08/31/2015   HDL 33 (A) 08/31/2015   LDLCALC 99 08/31/2015   TRIG 116 08/31/2015    Significant Diagnostic Results in last 30 days:  No results found.  Assessment/Plan  Cough This is a chronic problem that appeared slightly worse this morning for only one day with one low grade temp. I have asked the staff to continue to monitor and would like to avoid another chest xray. He appears to be in good condition at this time. Will check flu swab due to cough, low grade temp, and a sick contact.   Dysphagia Continue modified diet, asp prec Avoid hospitalizations, no feeding tubes.   Diastolic CHF ([place]) Compensated without medications.  Atrial fibrillation Rate controlled without meds. Continues on eliquis for CVA risk reduction.   Urinary retention Continue catheter care management. Cleanse meatus with soapy water qshift.   Constipation Controlled. Continue prn miralax and dulcolax supp  Dementia with behavioral disturbance FAST 7.  No longer on meds due to lack of benefit. Followed by hospice.    Family/  staff Communication: I spoke with the hospice nurse and she is going to speak with the family about his care and attempting to change the lab work to every other week.

## 2017-09-15 DIAGNOSIS — I4891 Unspecified atrial fibrillation: Secondary | ICD-10-CM | POA: Diagnosis not present

## 2017-09-15 DIAGNOSIS — I679 Cerebrovascular disease, unspecified: Secondary | ICD-10-CM | POA: Diagnosis not present

## 2017-09-15 DIAGNOSIS — N183 Chronic kidney disease, stage 3 (moderate): Secondary | ICD-10-CM | POA: Diagnosis not present

## 2017-09-15 DIAGNOSIS — I25810 Atherosclerosis of coronary artery bypass graft(s) without angina pectoris: Secondary | ICD-10-CM | POA: Diagnosis not present

## 2017-09-15 DIAGNOSIS — G309 Alzheimer's disease, unspecified: Secondary | ICD-10-CM | POA: Diagnosis not present

## 2017-09-15 DIAGNOSIS — I509 Heart failure, unspecified: Secondary | ICD-10-CM | POA: Diagnosis not present

## 2017-09-16 ENCOUNTER — Non-Acute Institutional Stay: Payer: Medicare Other | Admitting: Internal Medicine

## 2017-09-16 ENCOUNTER — Encounter: Payer: Self-pay | Admitting: Internal Medicine

## 2017-09-16 DIAGNOSIS — G301 Alzheimer's disease with late onset: Secondary | ICD-10-CM | POA: Diagnosis not present

## 2017-09-16 DIAGNOSIS — R339 Retention of urine, unspecified: Secondary | ICD-10-CM | POA: Diagnosis not present

## 2017-09-16 DIAGNOSIS — I872 Venous insufficiency (chronic) (peripheral): Secondary | ICD-10-CM

## 2017-09-16 DIAGNOSIS — Z515 Encounter for palliative care: Secondary | ICD-10-CM

## 2017-09-16 DIAGNOSIS — F02818 Dementia in other diseases classified elsewhere, unspecified severity, with other behavioral disturbance: Secondary | ICD-10-CM

## 2017-09-16 DIAGNOSIS — R059 Cough, unspecified: Secondary | ICD-10-CM

## 2017-09-16 DIAGNOSIS — F0281 Dementia in other diseases classified elsewhere with behavioral disturbance: Secondary | ICD-10-CM

## 2017-09-16 DIAGNOSIS — R1310 Dysphagia, unspecified: Secondary | ICD-10-CM

## 2017-09-16 DIAGNOSIS — R05 Cough: Secondary | ICD-10-CM

## 2017-09-16 NOTE — Progress Notes (Signed)
Patient ID: Bryan [name], male   DOB: Mar 08, 1923, 84 y.o.   MRN: [id number]  Location:  [facility] Room Number: 865 memory care Place of Service:  ALF [phone]) Provider:  Karen Kays, RN, [place], DNP Student  [name], DO  Patient Care Team: [name], DO as PCP - General (Geriatric Medicine) Community, [facility] Edgar Frisk, NP as Nurse Practitioner (Nurse Practitioner)  Extended Emergency Contact Information Primary Emergency Contact: [name] Address: [address]          [place], [place] [address] [place] Phone: [phone] Relation: Spouse Secondary Emergency Contact: [name], [place] Phone: [phone] Relation: Daughter  Code Status:  DNR  Goals of care: Advanced Directive information Advanced Directives 09/16/2017  Does Patient Have a Medical Advance Directive? Yes  Type of Paramedic of [place];Living will;Out of facility DNR (pink MOST or yellow form)  Does patient want to make changes to medical advance directive? No - Patient declined  Copy of [place] in Chart? Yes  Pre-existing out of facility DNR order (yellow form or pink MOST form) Yellow form placed in chart (order not valid for inpatient use);Pink MOST form placed in chart (order not valid for inpatient use)     Chief Complaint  Patient presents with  . Medical Management of Chronic Issues    routine visit    HPI:  Pt is a 84 y.o. male seen today for medical management of chronic diseases. Seen today in his room for a routine visit-resides in memory care due to advance dementia. Caregiver at bedside at time of visit. He was recently seen on 3/8 for a cough and low grade temp. At that time a CXR was not ordered as was noted to be in good condition at the time of exam. Today he is without complains of  the cough and nursing and caregiver have no additional reports for [place]. He is followed by Hospice due to advance dementia that has impacted his quality of life with recurrent PNA, weigh loss, and dysphagia. Avoiding hospitalizations and therefore no feeding tubes.   Per nursing: He is a total care patient. He does have some contractions that make mobility hard, use of Hoyer lift to transfer is required. He is sleeping well. He is without skin breakdown and his chronic foley is in good condition and is changed every 8 weeks. He is a feeder on a puree diet with boost and nectar thicken liquids. His pills are crushed and given with puree. His weight is stable at this time. He is incontinent of bowel, but skin is intact on bottom.     Past Medical History:  Diagnosis Date  . Allergic rhinitis   . Alzheimer's disease 2013  . Atrial fibrillation ([place])    Long term anti coagulation w/ warfarin fro stroke risk reduction  . BPH (benign prostatic hyperplasia)   . Chronic venous insufficiency 01/12/2013  . Dementia with behavioral disturbance 12/29/13  . Gait disorder   . GERD (gastroesophageal reflux disease)   . History of CVA (cerebrovascular accident) 2001   Rt.MCA  . Hyperlipidemia   . Lactose intolerance   . Long term (current) use of anticoagulants 11/23/2012   Long-term anticoagulation with warfarin for stroke risk reduction related to AF   Past Surgical History:  Procedure Laterality Date  . CATARACT EXTRACTION W/  INTRAOCULAR LENS  IMPLANT, BILATERAL    . HERNIA REPAIR Right 1970s  . KNEE SURGERY Right 2000  . MASS EXCISION Left 07/30/2013   Procedure: EXCISION OF BASAL CELL CARCINOMA  LEFT FOREHEAD ;  Surgeon: Irene Limbo, MD;  Location: [facility];  Service: Plastics;  Laterality: Left;  . RETINAL DETACHMENT SURGERY Right 2009    Allergies  Allergen Reactions  . Penicillins Rash  . Bee Venom   . Risperidone And Related     Unstable gait    Outpatient Encounter  Medications as of 09/16/2017  Medication Sig  . apixaban (ELIQUIS) 2.5 MG TABS tablet Take 2.5 mg by mouth 2 (two) times daily.  . bisacodyl (DULCOLAX) 10 MG suppository Place 10 mg rectally every other day.  . ipratropium-albuterol (DUONEB) 0.5-2.5 (3) MG/3ML SOLN Take 3 mLs by nebulization every 6 (six) hours as needed (for wheezing and cough). q 6 hrs prn  . lactose free nutrition (BOOST) LIQD Take 237 mLs by mouth 3 (three) times daily between meals.   . nitroGLYCERIN (NITROSTAT) 0.4 MG SL tablet Place 0.4 mg under the tongue every 5 (five) minutes as needed for chest pain.  . polyethylene glycol (MIRALAX / GLYCOLAX) packet Take 17 g by mouth daily as needed.  . potassium chloride SA (K-DUR,KLOR-CON) 20 MEQ tablet Take 40 mEq by mouth daily.   No facility-administered encounter medications on file as of 09/16/2017.     Review of Systems  Reason unable to perform ROS: performed with help of nursing staff.  Constitutional: Negative for activity change, appetite change and fatigue.  HENT: Positive for trouble swallowing.   Respiratory: Negative for cough and wheezing.   Cardiovascular: Positive for leg swelling.  Gastrointestinal:       Incont of bowel, wears depends  Genitourinary:       Chronic foley-urine retention  Musculoskeletal:       Contractions  Skin: Negative.   Psychiatric/Behavioral:       Advance dementia     Immunization History  Administered Date(s) Administered  . Influenza Inj Mdck Quad Pf 04/25/2016  . Influenza Whole 04/20/2013  . Influenza-Unspecified 04/12/2014, 04/20/2015, 05/01/2017  . PPD Test 11/29/2012  . Pneumococcal Conjugate-13 08/01/2016   Pertinent  Health Maintenance Due  Topic Date Due  . PNA vac Low Risk Adult (2 of 2 - PPSV23) 08/01/2017  . INFLUENZA VACCINE  Completed   Fall Risk  05/28/2017 06/21/2016  Falls in the past year? No No   Functional Status Survey:    Vitals:   09/16/17 1301  BP: 127/71  Pulse: 88  Resp: 17  Temp:  97.7 F (36.5 C)  TempSrc: Oral  SpO2: 93%  Weight: 175 lb (79.4 kg)   Body mass index is 23.73 kg/m. Physical Exam  Constitutional: He appears well-developed and well-nourished.  HENT:  Head: Normocephalic.  Neck: Normal range of motion. Neck supple.  Cardiovascular: Normal rate, regular rhythm, normal heart sounds and intact distal pulses.  Bilaterally ankle edema (1+)  Pulmonary/Chest: Effort normal. He has decreased breath sounds.  Abdominal: Soft. Bowel sounds are normal.  Musculoskeletal: He exhibits edema.  Skin: Skin is warm and dry.  Psychiatric:  Advance dementia, unable to assess further as patient was sleeping and was difficult to arousal.   Vitals reviewed.   Labs reviewed: Recent Labs    08/28/17 0700 09/04/17 0600 09/11/17  NA 141 139 138  K 4.6 4.2 4.7  BUN 15 18 20   CREATININE 0.9 0.9 1.1   No  results for input(s): AST, ALT, ALKPHOS, BILITOT, PROT, ALBUMIN in the last 8760 hours. Recent Labs    11/26/16 06/23/17 09/11/17  WBC 7.6 16.2 7.2  HGB 12.5* 13.9 14.1  HCT 37* 44 46  PLT 155 240 212   Lab Results  Component Value Date   TSH 5.05 08/08/2016   No results found for: HGBA1C Lab Results  Component Value Date   CHOL 155 08/31/2015   HDL 33 (A) 08/31/2015   LDLCALC 99 08/31/2015   TRIG 116 08/31/2015    Significant Diagnostic Results in last 30 days:  No results found.  Assessment/Plan  1. Cough This is an on going chronic problem that had several days of worsen, but seems to have improved over the course of the last few days. Staff advise to let us know should this return.   2. Urinary retention His catheter is changed every 8 weeks. There is no reported skin breakdown and nursing staff cleanse the meatus q-shift with soapy water as per previous order and communication with staff.   3. Late onset Alzheimer's disease with behavioral disturbance Dementia is late stage and medications no longer offer a benefit with their use. He is  being followed by Hospice and there is a no hospitalization order in place. FAST score is 7. Family desires to maintain some steady care measures such as daily weights and lab checks.   4. Chronic venous insufficiency He has continued swelling in his ankles and feet. This worsens with dependence of sitting in his chair. Compression stockings would support this from creating pain should this become an issue.   Family/ staff Communication:  Spoke with staff  Labs/tests ordered:

## 2017-09-18 DIAGNOSIS — D649 Anemia, unspecified: Secondary | ICD-10-CM | POA: Diagnosis not present

## 2017-09-18 DIAGNOSIS — I5020 Unspecified systolic (congestive) heart failure: Secondary | ICD-10-CM | POA: Diagnosis not present

## 2017-09-26 DIAGNOSIS — I25810 Atherosclerosis of coronary artery bypass graft(s) without angina pectoris: Secondary | ICD-10-CM | POA: Diagnosis not present

## 2017-09-26 DIAGNOSIS — N183 Chronic kidney disease, stage 3 (moderate): Secondary | ICD-10-CM | POA: Diagnosis not present

## 2017-09-26 DIAGNOSIS — I679 Cerebrovascular disease, unspecified: Secondary | ICD-10-CM | POA: Diagnosis not present

## 2017-09-26 DIAGNOSIS — I4891 Unspecified atrial fibrillation: Secondary | ICD-10-CM | POA: Diagnosis not present

## 2017-09-26 DIAGNOSIS — G309 Alzheimer's disease, unspecified: Secondary | ICD-10-CM | POA: Diagnosis not present

## 2017-09-26 DIAGNOSIS — I509 Heart failure, unspecified: Secondary | ICD-10-CM | POA: Diagnosis not present

## 2017-10-01 DIAGNOSIS — I25810 Atherosclerosis of coronary artery bypass graft(s) without angina pectoris: Secondary | ICD-10-CM | POA: Diagnosis not present

## 2017-10-01 DIAGNOSIS — I4891 Unspecified atrial fibrillation: Secondary | ICD-10-CM | POA: Diagnosis not present

## 2017-10-01 DIAGNOSIS — G309 Alzheimer's disease, unspecified: Secondary | ICD-10-CM | POA: Diagnosis not present

## 2017-10-01 DIAGNOSIS — I509 Heart failure, unspecified: Secondary | ICD-10-CM | POA: Diagnosis not present

## 2017-10-01 DIAGNOSIS — I679 Cerebrovascular disease, unspecified: Secondary | ICD-10-CM | POA: Diagnosis not present

## 2017-10-01 DIAGNOSIS — N183 Chronic kidney disease, stage 3 (moderate): Secondary | ICD-10-CM | POA: Diagnosis not present

## 2017-10-02 DIAGNOSIS — I5020 Unspecified systolic (congestive) heart failure: Secondary | ICD-10-CM | POA: Diagnosis not present

## 2017-10-02 DIAGNOSIS — D649 Anemia, unspecified: Secondary | ICD-10-CM | POA: Diagnosis not present

## 2017-10-03 DIAGNOSIS — G309 Alzheimer's disease, unspecified: Secondary | ICD-10-CM | POA: Diagnosis not present

## 2017-10-03 DIAGNOSIS — I4891 Unspecified atrial fibrillation: Secondary | ICD-10-CM | POA: Diagnosis not present

## 2017-10-03 DIAGNOSIS — I25810 Atherosclerosis of coronary artery bypass graft(s) without angina pectoris: Secondary | ICD-10-CM | POA: Diagnosis not present

## 2017-10-03 DIAGNOSIS — N183 Chronic kidney disease, stage 3 (moderate): Secondary | ICD-10-CM | POA: Diagnosis not present

## 2017-10-03 DIAGNOSIS — I679 Cerebrovascular disease, unspecified: Secondary | ICD-10-CM | POA: Diagnosis not present

## 2017-10-03 DIAGNOSIS — I509 Heart failure, unspecified: Secondary | ICD-10-CM | POA: Diagnosis not present

## 2017-10-06 DIAGNOSIS — N401 Enlarged prostate with lower urinary tract symptoms: Secondary | ICD-10-CM | POA: Diagnosis not present

## 2017-10-06 DIAGNOSIS — K219 Gastro-esophageal reflux disease without esophagitis: Secondary | ICD-10-CM | POA: Diagnosis not present

## 2017-10-06 DIAGNOSIS — R1310 Dysphagia, unspecified: Secondary | ICD-10-CM | POA: Diagnosis not present

## 2017-10-06 DIAGNOSIS — I509 Heart failure, unspecified: Secondary | ICD-10-CM | POA: Diagnosis not present

## 2017-10-06 DIAGNOSIS — N183 Chronic kidney disease, stage 3 (moderate): Secondary | ICD-10-CM | POA: Diagnosis not present

## 2017-10-06 DIAGNOSIS — M2450 Contracture, unspecified joint: Secondary | ICD-10-CM | POA: Diagnosis not present

## 2017-10-06 DIAGNOSIS — I679 Cerebrovascular disease, unspecified: Secondary | ICD-10-CM | POA: Diagnosis not present

## 2017-10-06 DIAGNOSIS — G309 Alzheimer's disease, unspecified: Secondary | ICD-10-CM | POA: Diagnosis not present

## 2017-10-06 DIAGNOSIS — E785 Hyperlipidemia, unspecified: Secondary | ICD-10-CM | POA: Diagnosis not present

## 2017-10-06 DIAGNOSIS — I4891 Unspecified atrial fibrillation: Secondary | ICD-10-CM | POA: Diagnosis not present

## 2017-10-06 DIAGNOSIS — I25810 Atherosclerosis of coronary artery bypass graft(s) without angina pectoris: Secondary | ICD-10-CM | POA: Diagnosis not present

## 2017-10-08 DIAGNOSIS — I509 Heart failure, unspecified: Secondary | ICD-10-CM | POA: Diagnosis not present

## 2017-10-08 DIAGNOSIS — N183 Chronic kidney disease, stage 3 (moderate): Secondary | ICD-10-CM | POA: Diagnosis not present

## 2017-10-08 DIAGNOSIS — I25810 Atherosclerosis of coronary artery bypass graft(s) without angina pectoris: Secondary | ICD-10-CM | POA: Diagnosis not present

## 2017-10-08 DIAGNOSIS — G309 Alzheimer's disease, unspecified: Secondary | ICD-10-CM | POA: Diagnosis not present

## 2017-10-08 DIAGNOSIS — I679 Cerebrovascular disease, unspecified: Secondary | ICD-10-CM | POA: Diagnosis not present

## 2017-10-08 DIAGNOSIS — I4891 Unspecified atrial fibrillation: Secondary | ICD-10-CM | POA: Diagnosis not present

## 2017-10-09 DIAGNOSIS — I679 Cerebrovascular disease, unspecified: Secondary | ICD-10-CM | POA: Diagnosis not present

## 2017-10-09 DIAGNOSIS — I509 Heart failure, unspecified: Secondary | ICD-10-CM | POA: Diagnosis not present

## 2017-10-09 DIAGNOSIS — I25810 Atherosclerosis of coronary artery bypass graft(s) without angina pectoris: Secondary | ICD-10-CM | POA: Diagnosis not present

## 2017-10-09 DIAGNOSIS — I4891 Unspecified atrial fibrillation: Secondary | ICD-10-CM | POA: Diagnosis not present

## 2017-10-09 DIAGNOSIS — N183 Chronic kidney disease, stage 3 (moderate): Secondary | ICD-10-CM | POA: Diagnosis not present

## 2017-10-09 DIAGNOSIS — G309 Alzheimer's disease, unspecified: Secondary | ICD-10-CM | POA: Diagnosis not present

## 2017-10-14 DIAGNOSIS — I4891 Unspecified atrial fibrillation: Secondary | ICD-10-CM | POA: Diagnosis not present

## 2017-10-14 DIAGNOSIS — I679 Cerebrovascular disease, unspecified: Secondary | ICD-10-CM | POA: Diagnosis not present

## 2017-10-14 DIAGNOSIS — I509 Heart failure, unspecified: Secondary | ICD-10-CM | POA: Diagnosis not present

## 2017-10-14 DIAGNOSIS — G309 Alzheimer's disease, unspecified: Secondary | ICD-10-CM | POA: Diagnosis not present

## 2017-10-14 DIAGNOSIS — N183 Chronic kidney disease, stage 3 (moderate): Secondary | ICD-10-CM | POA: Diagnosis not present

## 2017-10-14 DIAGNOSIS — I25810 Atherosclerosis of coronary artery bypass graft(s) without angina pectoris: Secondary | ICD-10-CM | POA: Diagnosis not present

## 2017-10-16 DIAGNOSIS — I5020 Unspecified systolic (congestive) heart failure: Secondary | ICD-10-CM | POA: Diagnosis not present

## 2017-10-16 DIAGNOSIS — D649 Anemia, unspecified: Secondary | ICD-10-CM | POA: Diagnosis not present

## 2017-10-21 DIAGNOSIS — I509 Heart failure, unspecified: Secondary | ICD-10-CM | POA: Diagnosis not present

## 2017-10-21 DIAGNOSIS — I25810 Atherosclerosis of coronary artery bypass graft(s) without angina pectoris: Secondary | ICD-10-CM | POA: Diagnosis not present

## 2017-10-21 DIAGNOSIS — G309 Alzheimer's disease, unspecified: Secondary | ICD-10-CM | POA: Diagnosis not present

## 2017-10-21 DIAGNOSIS — I679 Cerebrovascular disease, unspecified: Secondary | ICD-10-CM | POA: Diagnosis not present

## 2017-10-21 DIAGNOSIS — N183 Chronic kidney disease, stage 3 (moderate): Secondary | ICD-10-CM | POA: Diagnosis not present

## 2017-10-21 DIAGNOSIS — I4891 Unspecified atrial fibrillation: Secondary | ICD-10-CM | POA: Diagnosis not present

## 2017-10-30 ENCOUNTER — Encounter: Payer: Self-pay | Admitting: Adult Health

## 2017-10-30 ENCOUNTER — Non-Acute Institutional Stay: Payer: Medicare Other | Admitting: Adult Health

## 2017-10-30 DIAGNOSIS — E860 Dehydration: Secondary | ICD-10-CM

## 2017-10-30 DIAGNOSIS — G301 Alzheimer's disease with late onset: Secondary | ICD-10-CM

## 2017-10-30 DIAGNOSIS — F0281 Dementia in other diseases classified elsewhere with behavioral disturbance: Secondary | ICD-10-CM

## 2017-10-30 DIAGNOSIS — I4891 Unspecified atrial fibrillation: Secondary | ICD-10-CM | POA: Diagnosis not present

## 2017-10-30 DIAGNOSIS — I679 Cerebrovascular disease, unspecified: Secondary | ICD-10-CM | POA: Diagnosis not present

## 2017-10-30 DIAGNOSIS — G309 Alzheimer's disease, unspecified: Secondary | ICD-10-CM | POA: Diagnosis not present

## 2017-10-30 DIAGNOSIS — I509 Heart failure, unspecified: Secondary | ICD-10-CM | POA: Diagnosis not present

## 2017-10-30 DIAGNOSIS — R634 Abnormal weight loss: Secondary | ICD-10-CM | POA: Diagnosis not present

## 2017-10-30 DIAGNOSIS — I5020 Unspecified systolic (congestive) heart failure: Secondary | ICD-10-CM | POA: Diagnosis not present

## 2017-10-30 DIAGNOSIS — N183 Chronic kidney disease, stage 3 (moderate): Secondary | ICD-10-CM | POA: Diagnosis not present

## 2017-10-30 DIAGNOSIS — F02818 Dementia in other diseases classified elsewhere, unspecified severity, with other behavioral disturbance: Secondary | ICD-10-CM

## 2017-10-30 DIAGNOSIS — D649 Anemia, unspecified: Secondary | ICD-10-CM | POA: Diagnosis not present

## 2017-10-30 DIAGNOSIS — I25810 Atherosclerosis of coronary artery bypass graft(s) without angina pectoris: Secondary | ICD-10-CM | POA: Diagnosis not present

## 2017-10-30 LAB — BASIC METABOLIC PANEL
BUN: 30 — AB (ref 4–21)
Creatinine: 1.1 (ref 0.6–1.3)
Glucose: 113
Potassium: 4.3 (ref 3.4–5.3)
SODIUM: 142 (ref 137–147)

## 2017-10-30 NOTE — Progress Notes (Signed)
Location:  Occupational psychologist of Service:  ALF (13) Provider:   Cindi Carbon, ANP [place] [phone]   [name], DO  Patient Care Team: [name], DO as PCP - General (Geriatric Medicine) Community, [facility] Angelique Holm, Margreta Journey, NP as Nurse Practitioner (Nurse Practitioner)  Extended Emergency Contact Information Primary Emergency Contact: [name] Address: [address]          [place], [place] [address] [place] Phone: [phone] Relation: Spouse Secondary Emergency Contact: [name],  [place] Phone: [phone] Relation: Daughter  Code Status:  DNR Goals of care: Advanced Directive information Advanced Directives 09/16/2017  Does Patient Have a Medical Advance Directive? Yes  Type of Paramedic of [place];Living will;Out of facility DNR (pink MOST or yellow form)  Does patient want to make changes to medical advance directive? No - Patient declined  Copy of [place] in Chart? Yes  Pre-existing out of facility DNR order (yellow form or pink MOST form) Yellow form placed in chart (order not valid for inpatient use);Pink MOST form placed in chart (order not valid for inpatient use)     Chief Complaint  Patient presents with  . Acute Visit    not eating, family request IV    HPI:  Pt is a 84 y.o. male seen today for an acute visit for poor intake. He has advanced dementia and is dependent on staff for all ADLs.  He has been losing weight and not eating. He clinches his mouth together at times when the staff tries to feed him. He is followed by hospice for dementia, dysphagia, and weight loss. He has bi weekly BMPs (family request) and his BUN returned at 30.  He has a foley catheter due to retention and his urine output was 25 cc for day shift.   Wt Readings from Last 3  Encounters:  10/30/17 166 lb 4.8 oz (75.4 kg)  09/16/17 175 lb (79.4 kg)  09/12/17 175 lb (79.4 kg)      Past Medical History:  Diagnosis Date  . Allergic rhinitis   . Alzheimer's disease 2013  . Atrial fibrillation ([place])    Long term anti coagulation w/ warfarin fro stroke risk reduction  . BPH (benign prostatic hyperplasia)   . Chronic venous insufficiency 01/12/2013  . Dementia with behavioral disturbance 12/29/13  . Gait disorder   . GERD (gastroesophageal reflux disease)   . History of CVA (cerebrovascular accident) 2001   Rt.MCA  . Hyperlipidemia   . Lactose intolerance   . Long term (current) use of anticoagulants 11/23/2012   Long-term anticoagulation with warfarin for stroke risk reduction related to AF   Past Surgical History:  Procedure Laterality Date  . CATARACT EXTRACTION W/ INTRAOCULAR LENS  IMPLANT, BILATERAL    . HERNIA REPAIR Right 1970s  . KNEE SURGERY Right 2000  . MASS EXCISION Left 07/30/2013   Procedure: EXCISION OF BASAL CELL CARCINOMA  LEFT FOREHEAD ;  Surgeon: Irene Limbo, MD;  Location: [facility];  Service: Plastics;  Laterality: Left;  . RETINAL DETACHMENT SURGERY Right 2009    Allergies  Allergen Reactions  . Penicillins Rash  . Bee Venom   . Risperidone And Related     Unstable gait    Outpatient Encounter Medications as of 10/30/2017  Medication Sig  . apixaban (ELIQUIS) 2.5 MG  TABS tablet Take 2.5 mg by mouth 2 (two) times daily.  . bisacodyl (DULCOLAX) 10 MG suppository Place 10 mg rectally every other day.  . ipratropium-albuterol (DUONEB) 0.5-2.5 (3) MG/3ML SOLN Take 3 mLs by nebulization every 6 (six) hours as needed (for wheezing and cough). q 6 hrs prn  . lactose free nutrition (BOOST) LIQD Take 237 mLs by mouth 3 (three) times daily between meals.   . nitroGLYCERIN (NITROSTAT) 0.4 MG SL tablet Place 0.4 mg under the tongue every 5 (five) minutes as needed for chest pain.  . polyethylene glycol (MIRALAX /  GLYCOLAX) packet Take 17 g by mouth daily as needed.  . potassium chloride SA (K-DUR,KLOR-CON) 20 MEQ tablet Take 40 mEq by mouth daily.   No facility-administered encounter medications on file as of 10/30/2017.     Review of Systems  Unable to perform ROS: Dementia    Immunization History  Administered Date(s) Administered  . Influenza Inj Mdck Quad Pf 04/25/2016  . Influenza Whole 04/20/2013  . Influenza-Unspecified 04/12/2014, 04/20/2015, 05/01/2017  . PPD Test 11/29/2012  . Pneumococcal Conjugate-13 08/01/2016   Pertinent  Health Maintenance Due  Topic Date Due  . PNA vac Low Risk Adult (2 of 2 - PPSV23) 08/01/2017  . INFLUENZA VACCINE  02/05/2018   Fall Risk  05/28/2017 06/21/2016  Falls in the past year? No No   Functional Status Survey:    Vitals:   10/30/17 1637  Weight: 166 lb 4.8 oz (75.4 kg)   Body mass index is 22.55 kg/m. Physical Exam  Constitutional: No distress.  HENT:  Head: Normocephalic and atraumatic.  Nose: Nose normal.  Refuses oral exam  Neck: No JVD present. No tracheal deviation present. No thyromegaly present.  Cardiovascular: Regular rhythm.  No murmur heard. irregular  Pulmonary/Chest: Effort normal. No respiratory distress. He has wheezes (mild to left upper lobe).  Abdominal: Soft. Bowel sounds are normal. He exhibits no distension. There is no tenderness.  Musculoskeletal:  BLE ankle edema  Lymphadenopathy:    He has no cervical adenopathy.  Neurological: He is alert. No cranial nerve deficit.  Not verbal or able to follow commands  Skin: Skin is warm and dry. He is not diaphoretic.  Psychiatric:  Flat, stares straight ahead    Labs reviewed: Recent Labs    09/04/17 0600 09/11/17 10/30/17  NA 139 138 142  K 4.2 4.7 4.3  BUN 18 20 30*  CREATININE 0.9 1.1 1.1   No results for input(s): AST, ALT, ALKPHOS, BILITOT, PROT, ALBUMIN in the last 8760 hours. Recent Labs    11/26/16 06/23/17 09/11/17  WBC 7.6 16.2 7.2  HGB  12.5* 13.9 14.1  HCT 37* 44 46  PLT 155 240 212   Lab Results  Component Value Date   TSH 5.05 08/08/2016   No results found for: HGBA1C Lab Results  Component Value Date   CHOL 155 08/31/2015   HDL 33 (A) 08/31/2015   LDLCALC 99 08/31/2015   TRIG 116 08/31/2015    Significant Diagnostic Results in last 30 days:  No results found.  Assessment/Plan  1. Dehydration His son called [facility] and requested an IV bolus. He stated that if he did not improve we would not pursue more treatment. He is currently reading the book [facility] Mortal and is beginning to come to terms with his father's terminal illness. We agreed to do 1 Liter of NS at 80/cc hr the discontinue the IV.   2. Weight loss Progressive weight loss due  to dementia, dysphagia, and poor intake  3. Late onset Alzheimer's disease with behavioral disturbance FAST 7, no longer on meds for memory due to lack of benefit End of life Followed by hospice   Family/ staff Communication: son, Dr. Apolonio Schneiders  Labs/tests ordered:  NA

## 2017-11-03 DIAGNOSIS — I679 Cerebrovascular disease, unspecified: Secondary | ICD-10-CM | POA: Diagnosis not present

## 2017-11-03 DIAGNOSIS — I25810 Atherosclerosis of coronary artery bypass graft(s) without angina pectoris: Secondary | ICD-10-CM | POA: Diagnosis not present

## 2017-11-03 DIAGNOSIS — I5020 Unspecified systolic (congestive) heart failure: Secondary | ICD-10-CM | POA: Diagnosis not present

## 2017-11-03 DIAGNOSIS — I10 Essential (primary) hypertension: Secondary | ICD-10-CM | POA: Diagnosis not present

## 2017-11-03 DIAGNOSIS — G4483 Primary cough headache: Secondary | ICD-10-CM | POA: Diagnosis not present

## 2017-11-03 DIAGNOSIS — G309 Alzheimer's disease, unspecified: Secondary | ICD-10-CM | POA: Diagnosis not present

## 2017-11-03 DIAGNOSIS — N183 Chronic kidney disease, stage 3 (moderate): Secondary | ICD-10-CM | POA: Diagnosis not present

## 2017-11-03 DIAGNOSIS — D649 Anemia, unspecified: Secondary | ICD-10-CM | POA: Diagnosis not present

## 2017-11-03 DIAGNOSIS — I509 Heart failure, unspecified: Secondary | ICD-10-CM | POA: Diagnosis not present

## 2017-11-03 DIAGNOSIS — I4891 Unspecified atrial fibrillation: Secondary | ICD-10-CM | POA: Diagnosis not present

## 2017-11-03 LAB — BASIC METABOLIC PANEL
BUN: 27 — AB (ref 4–21)
CREATININE: 1.1 (ref 0.6–1.3)
Glucose: 109
POTASSIUM: 4.2 (ref 3.4–5.3)
SODIUM: 142 (ref 137–147)

## 2017-11-05 DIAGNOSIS — I509 Heart failure, unspecified: Secondary | ICD-10-CM | POA: Diagnosis not present

## 2017-11-05 DIAGNOSIS — I679 Cerebrovascular disease, unspecified: Secondary | ICD-10-CM | POA: Diagnosis not present

## 2017-11-05 DIAGNOSIS — I25810 Atherosclerosis of coronary artery bypass graft(s) without angina pectoris: Secondary | ICD-10-CM | POA: Diagnosis not present

## 2017-11-05 DIAGNOSIS — M2450 Contracture, unspecified joint: Secondary | ICD-10-CM | POA: Diagnosis not present

## 2017-11-05 DIAGNOSIS — N401 Enlarged prostate with lower urinary tract symptoms: Secondary | ICD-10-CM | POA: Diagnosis not present

## 2017-11-05 DIAGNOSIS — R1310 Dysphagia, unspecified: Secondary | ICD-10-CM | POA: Diagnosis not present

## 2017-11-05 DIAGNOSIS — I4891 Unspecified atrial fibrillation: Secondary | ICD-10-CM | POA: Diagnosis not present

## 2017-11-05 DIAGNOSIS — N183 Chronic kidney disease, stage 3 (moderate): Secondary | ICD-10-CM | POA: Diagnosis not present

## 2017-11-05 DIAGNOSIS — K219 Gastro-esophageal reflux disease without esophagitis: Secondary | ICD-10-CM | POA: Diagnosis not present

## 2017-11-05 DIAGNOSIS — G309 Alzheimer's disease, unspecified: Secondary | ICD-10-CM | POA: Diagnosis not present

## 2017-11-05 DIAGNOSIS — E785 Hyperlipidemia, unspecified: Secondary | ICD-10-CM | POA: Diagnosis not present

## 2017-11-07 DIAGNOSIS — I25810 Atherosclerosis of coronary artery bypass graft(s) without angina pectoris: Secondary | ICD-10-CM | POA: Diagnosis not present

## 2017-11-07 DIAGNOSIS — I4891 Unspecified atrial fibrillation: Secondary | ICD-10-CM | POA: Diagnosis not present

## 2017-11-07 DIAGNOSIS — I509 Heart failure, unspecified: Secondary | ICD-10-CM | POA: Diagnosis not present

## 2017-11-07 DIAGNOSIS — N183 Chronic kidney disease, stage 3 (moderate): Secondary | ICD-10-CM | POA: Diagnosis not present

## 2017-11-07 DIAGNOSIS — G309 Alzheimer's disease, unspecified: Secondary | ICD-10-CM | POA: Diagnosis not present

## 2017-11-07 DIAGNOSIS — I679 Cerebrovascular disease, unspecified: Secondary | ICD-10-CM | POA: Diagnosis not present

## 2017-11-10 ENCOUNTER — Encounter: Payer: Self-pay | Admitting: Adult Health

## 2017-11-10 ENCOUNTER — Non-Acute Institutional Stay: Payer: Medicare Other | Admitting: Adult Health

## 2017-11-10 DIAGNOSIS — G309 Alzheimer's disease, unspecified: Secondary | ICD-10-CM | POA: Diagnosis not present

## 2017-11-10 DIAGNOSIS — R339 Retention of urine, unspecified: Secondary | ICD-10-CM | POA: Diagnosis not present

## 2017-11-10 DIAGNOSIS — F0281 Dementia in other diseases classified elsewhere with behavioral disturbance: Secondary | ICD-10-CM | POA: Diagnosis not present

## 2017-11-10 DIAGNOSIS — G301 Alzheimer's disease with late onset: Secondary | ICD-10-CM | POA: Diagnosis not present

## 2017-11-10 DIAGNOSIS — I25810 Atherosclerosis of coronary artery bypass graft(s) without angina pectoris: Secondary | ICD-10-CM | POA: Diagnosis not present

## 2017-11-10 DIAGNOSIS — I482 Chronic atrial fibrillation: Secondary | ICD-10-CM

## 2017-11-10 DIAGNOSIS — I5032 Chronic diastolic (congestive) heart failure: Secondary | ICD-10-CM

## 2017-11-10 DIAGNOSIS — I4821 Permanent atrial fibrillation: Secondary | ICD-10-CM

## 2017-11-10 DIAGNOSIS — R1310 Dysphagia, unspecified: Secondary | ICD-10-CM | POA: Diagnosis not present

## 2017-11-10 DIAGNOSIS — I872 Venous insufficiency (chronic) (peripheral): Secondary | ICD-10-CM | POA: Diagnosis not present

## 2017-11-10 DIAGNOSIS — N1330 Unspecified hydronephrosis: Secondary | ICD-10-CM | POA: Diagnosis not present

## 2017-11-10 DIAGNOSIS — I4891 Unspecified atrial fibrillation: Secondary | ICD-10-CM | POA: Diagnosis not present

## 2017-11-10 DIAGNOSIS — N183 Chronic kidney disease, stage 3 (moderate): Secondary | ICD-10-CM | POA: Diagnosis not present

## 2017-11-10 DIAGNOSIS — I679 Cerebrovascular disease, unspecified: Secondary | ICD-10-CM | POA: Diagnosis not present

## 2017-11-10 DIAGNOSIS — R634 Abnormal weight loss: Secondary | ICD-10-CM

## 2017-11-10 DIAGNOSIS — I509 Heart failure, unspecified: Secondary | ICD-10-CM | POA: Diagnosis not present

## 2017-11-10 DIAGNOSIS — F02818 Dementia in other diseases classified elsewhere, unspecified severity, with other behavioral disturbance: Secondary | ICD-10-CM

## 2017-11-10 NOTE — Progress Notes (Signed)
Provider:   Cindi Carbon, ANP [place] [phone]  Location: [facility]:  ALF (13)   PCP: [name], DO Patient Care Team: [name], DO as PCP - General (Geriatric Medicine) Community, Well Edgar Frisk, NP as Nurse Practitioner (Nurse Practitioner)  Extended Emergency Contact Information Primary Emergency Contact: [name] Address: [address]          [place], [place] [address] [place] Phone: [phone] Relation: Spouse Secondary Emergency Contact: [name], [place] Phone: [phone] Relation: Daughter  Code Status: DNR Goals of Care: Advanced Directive information Advanced Directives 11/10/2017  Does Patient Have a Medical Advance Directive? Yes  Type of Paramedic of [place];Living will;Out of facility DNR (pink MOST or yellow form)  Does patient want to make changes to medical advance directive? No - Patient declined  Copy of [place] in Chart? Yes  Pre-existing out of facility DNR order (yellow form or pink MOST form) Yellow form placed in chart (order not valid for inpatient use);Pink MOST form placed in chart (order not valid for inpatient use)     Chief Complaint  Patient presents with  . Medical Management of Chronic Issues    HPI: Patient is a 84 y.o. male seen today for medical management of chronic diseases. He resides in memory care due to advanced dementia. He is followed by hospice for dementia, dysphagia, and weight loss.  He has a catheter due to BPH with urinary retention and is incontinent of stool. A hoyer lift is used for transfers and he requires assistance with all ADLs. He has a BMP drawn every two weeks as his family is concerned about dehydration.  He had a 1L bolus one week ago due to elevated BUN of 30 and poor intake at  his family's request. He has improved in intake this week and ate 100% of breakfast and lunch.  Up to date on vaccinations Age out of colonoscopy   Past Medical History:  Diagnosis Date  . Allergic rhinitis   . Alzheimer's disease 2013  . Atrial fibrillation ([place])    Long term anti coagulation w/ warfarin fro stroke risk reduction  . BPH (benign prostatic hyperplasia)   . Chronic venous insufficiency 01/12/2013  . Dementia with behavioral disturbance 12/29/13  . Gait disorder   . GERD (gastroesophageal reflux disease)   . History of CVA (cerebrovascular accident) 2001   Rt.MCA  . Hyperlipidemia   . Lactose intolerance   . Long term (current) use of anticoagulants 11/23/2012   Long-term anticoagulation with warfarin for stroke risk reduction related to AF   Past Surgical History:  Procedure Laterality Date  . CATARACT EXTRACTION W/ INTRAOCULAR LENS  IMPLANT, BILATERAL    . HERNIA REPAIR Right 1970s  . KNEE SURGERY Right 2000  . MASS EXCISION Left 07/30/2013   Procedure: EXCISION OF BASAL CELL CARCINOMA  LEFT FOREHEAD ;  Surgeon: Irene Limbo, MD;  Location: [facility];  Service: Plastics;  Laterality: Left;  . RETINAL DETACHMENT SURGERY Right 2009    reports that he quit smoking about 34 years ago. He quit smokeless tobacco use about 47 years ago. He reports that he drinks alcohol. He reports that he does not use drugs. Social History   Socioeconomic History  . Marital status: Married    Spouse name:  Not on file  . Number of children: Not on file  . Years of education: Not on file  . Highest education level: Not on file  Occupational History  . Not on file  Social Needs  . Financial resource strain: Not on file  . Food insecurity:    Worry: Not on file    Inability: Not on file  . Transportation needs:    Medical: Not on file    Non-medical: Not on file  Tobacco Use  . Smoking status: Former Smoker    Last attempt to quit: 07/24/1983    Years since  quitting: 34.3  . Smokeless tobacco: Former Systems developer    Quit date: 11/18/1970  Substance and Sexual Activity  . Alcohol use: Yes    Comment: occasionally  . Drug use: No  . Sexual activity: Not on file  Lifestyle  . Physical activity:    Days per week: Not on file    Minutes per session: Not on file  . Stress: Not on file  Relationships  . Social connections:    Talks on phone: Not on file    Gets together: Not on file    Attends religious service: Not on file    Active member of club or organization: Not on file    Attends meetings of clubs or organizations: Not on file    Relationship status: Not on file  Other Topics Concern  . Not on file  Social History Narrative   Married. 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. Patient and spouse have resided at [facility] since June 2010. Patient recently moved to the Memory Care section in same community. He quit smoking 1972, drinks an occasional alcoholic drink.   Has a living will and healthcare power of attorney.   History reviewed. No pertinent family history.  Pertinent  Health Maintenance Due  Topic Date Due  . INFLUENZA VACCINE  02/05/2018  . PNA vac Low Risk Adult  Completed   Fall Risk  05/28/2017 06/21/2016  Falls in the past year? No No   No flowsheet data found.  Functional Status Survey:    Allergies  Allergen Reactions  . Penicillins Rash  . Bee Venom   . Risperidone And Related     Unstable gait    Outpatient Encounter Medications as of 11/10/2017  Medication Sig  . apixaban (ELIQUIS) 2.5 MG TABS tablet Take 2.5 mg by mouth 2 (two) times daily.  . bisacodyl (DULCOLAX) 10 MG suppository Place 10 mg rectally every other day.  . ipratropium-albuterol (DUONEB) 0.5-2.5 (3) MG/3ML SOLN Take 3 mLs by nebulization every 6 (six) hours as needed (for wheezing and cough). q 6 hrs prn  . lactose free nutrition (BOOST) LIQD Take 237 mLs by mouth daily.   . nitroGLYCERIN (NITROSTAT) 0.4 MG SL tablet Place  0.4 mg under the tongue every 5 (five) minutes as needed for chest pain.  . polyethylene glycol (MIRALAX / GLYCOLAX) packet Take 17 g by mouth daily as needed.  . potassium chloride SA (K-DUR,KLOR-CON) 20 MEQ tablet Take 40 mEq by mouth daily.   No facility-administered encounter medications on file as of 11/10/2017.     Review of Systems  Unable to perform ROS: Dementia    Vitals:   11/10/17 1421  Weight: 162 lb 12.8 oz (73.8 kg)   Body mass index is 22.08 kg/m. Physical Exam  Constitutional: No distress.  HENT:  Head: Normocephalic and atraumatic.  Right Ear: External ear normal.  Left Ear: External ear  normal.  Nose: Nose normal.  Mouth/Throat: Oropharynx is clear and moist. No oropharyngeal exudate.  Eyes: Pupils are equal, round, and reactive to light. Conjunctivae are normal. Right eye exhibits no discharge. Left eye exhibits no discharge.  Neck: No JVD present. No tracheal deviation present. No thyromegaly present.  Cardiovascular: Normal rate and regular rhythm.  No murmur heard. BLE edema +2  Pulmonary/Chest: Effort normal and breath sounds normal. No respiratory distress. He has no wheezes.  Abdominal: Soft. Bowel sounds are normal. He exhibits no distension. There is no tenderness.  Genitourinary: No penile tenderness.  Genitourinary Comments: hypospadias  Musculoskeletal: He exhibits no edema or tenderness.  Lymphadenopathy:    He has no cervical adenopathy.  Neurological: He is alert.  Not able to f/c or answer q's Rigidity to BUE and BLE. Resist exam  Skin: Skin is warm and dry. He is not diaphoretic.  Psychiatric: He has a normal mood and affect.    Labs reviewed: Basic Metabolic Panel: Recent Labs    09/11/17 10/30/17 11/03/17  NA 138 142 142  K 4.7 4.3 4.2  BUN 20 30* 27*  CREATININE 1.1 1.1 1.1   Liver Function Tests: No results for input(s): AST, ALT, ALKPHOS, BILITOT, PROT, ALBUMIN in the last 8760 hours. No results for input(s): LIPASE,  AMYLASE in the last 8760 hours. No results for input(s): AMMONIA in the last 8760 hours. CBC: Recent Labs    11/26/16 06/23/17 09/11/17  WBC 7.6 16.2 7.2  HGB 12.5* 13.9 14.1  HCT 37* 44 46  PLT 155 240 212   Cardiac Enzymes: No results for input(s): CKTOTAL, CKMB, CKMBINDEX, TROPONINI in the last 8760 hours. BNP: Invalid input(s): POCBNP No results found for: HGBA1C Lab Results  Component Value Date   TSH 5.05 08/08/2016   No results found for: VITAMINB12 No results found for: FOLATE No results found for: IRON, TIBC, FERRITIN  Imaging and Procedures obtained recently: No results found.  Assessment/Plan  Atrial fibrillation Rate is controlled without medications. He continues on Eliquis for CVA risk reduction.   Chronic venous insufficiency Continue leg elevation and compression hose.   Diastolic CHF ([place]) Compensated. Continue to monitor weights daily.  Dysphagia Continue puree diet with thickened liquids. Asp prec with 1:1 superivison  Dementia with behavioral disturbance Very advanced, needs assistance with all ADLs. His weight continues to trend downward due to decreased intake over time associated with his dementia. Followed by hospice.   Urinary retention Continue foley catheter.   Hydronephrosis Noted in 2018 to the left kidney and ureter. No intervention due to goals of care.   Loss of weight See above Check TSH   Family/ staff Communication: staff/caretaker  Labs/tests ordered: TSH with next BMP

## 2017-11-13 DIAGNOSIS — D649 Anemia, unspecified: Secondary | ICD-10-CM | POA: Diagnosis not present

## 2017-11-13 DIAGNOSIS — E039 Hypothyroidism, unspecified: Secondary | ICD-10-CM | POA: Diagnosis not present

## 2017-11-13 DIAGNOSIS — R634 Abnormal weight loss: Secondary | ICD-10-CM | POA: Diagnosis not present

## 2017-11-13 DIAGNOSIS — I5020 Unspecified systolic (congestive) heart failure: Secondary | ICD-10-CM | POA: Diagnosis not present

## 2017-11-13 LAB — TSH: TSH: 4.17 (ref 0.41–5.90)

## 2017-11-13 NOTE — Assessment & Plan Note (Signed)
Continue foley catheter.

## 2017-11-13 NOTE — Assessment & Plan Note (Signed)
Rate is controlled without medications. He continues on Eliquis for CVA risk reduction.

## 2017-11-13 NOTE — Assessment & Plan Note (Signed)
See above Check TSH

## 2017-11-13 NOTE — Assessment & Plan Note (Signed)
Compensated. Continue to monitor weights daily.

## 2017-11-13 NOTE — Assessment & Plan Note (Signed)
Noted in 2018 to the left kidney and ureter. No intervention due to goals of care.

## 2017-11-13 NOTE — Assessment & Plan Note (Signed)
Very advanced, needs assistance with all ADLs. His weight continues to trend downward due to decreased intake over time associated with his dementia. Followed by hospice.

## 2017-11-13 NOTE — Assessment & Plan Note (Signed)
Continue leg elevation and compression hose.

## 2017-11-13 NOTE — Assessment & Plan Note (Signed)
Continue puree diet with thickened liquids. Asp prec with 1:1 superivison

## 2017-11-17 DIAGNOSIS — I4891 Unspecified atrial fibrillation: Secondary | ICD-10-CM | POA: Diagnosis not present

## 2017-11-17 DIAGNOSIS — I679 Cerebrovascular disease, unspecified: Secondary | ICD-10-CM | POA: Diagnosis not present

## 2017-11-17 DIAGNOSIS — I25810 Atherosclerosis of coronary artery bypass graft(s) without angina pectoris: Secondary | ICD-10-CM | POA: Diagnosis not present

## 2017-11-17 DIAGNOSIS — N183 Chronic kidney disease, stage 3 (moderate): Secondary | ICD-10-CM | POA: Diagnosis not present

## 2017-11-17 DIAGNOSIS — G309 Alzheimer's disease, unspecified: Secondary | ICD-10-CM | POA: Diagnosis not present

## 2017-11-17 DIAGNOSIS — I509 Heart failure, unspecified: Secondary | ICD-10-CM | POA: Diagnosis not present

## 2017-11-25 DIAGNOSIS — I4891 Unspecified atrial fibrillation: Secondary | ICD-10-CM | POA: Diagnosis not present

## 2017-11-25 DIAGNOSIS — G309 Alzheimer's disease, unspecified: Secondary | ICD-10-CM | POA: Diagnosis not present

## 2017-11-25 DIAGNOSIS — I679 Cerebrovascular disease, unspecified: Secondary | ICD-10-CM | POA: Diagnosis not present

## 2017-11-25 DIAGNOSIS — I509 Heart failure, unspecified: Secondary | ICD-10-CM | POA: Diagnosis not present

## 2017-11-25 DIAGNOSIS — N183 Chronic kidney disease, stage 3 (moderate): Secondary | ICD-10-CM | POA: Diagnosis not present

## 2017-11-25 DIAGNOSIS — I25810 Atherosclerosis of coronary artery bypass graft(s) without angina pectoris: Secondary | ICD-10-CM | POA: Diagnosis not present

## 2017-11-27 DIAGNOSIS — D649 Anemia, unspecified: Secondary | ICD-10-CM | POA: Diagnosis not present

## 2017-11-27 DIAGNOSIS — I5020 Unspecified systolic (congestive) heart failure: Secondary | ICD-10-CM | POA: Diagnosis not present

## 2017-11-27 DIAGNOSIS — R634 Abnormal weight loss: Secondary | ICD-10-CM | POA: Diagnosis not present

## 2017-12-04 DIAGNOSIS — I679 Cerebrovascular disease, unspecified: Secondary | ICD-10-CM | POA: Diagnosis not present

## 2017-12-04 DIAGNOSIS — N183 Chronic kidney disease, stage 3 (moderate): Secondary | ICD-10-CM | POA: Diagnosis not present

## 2017-12-04 DIAGNOSIS — G309 Alzheimer's disease, unspecified: Secondary | ICD-10-CM | POA: Diagnosis not present

## 2017-12-04 DIAGNOSIS — I25810 Atherosclerosis of coronary artery bypass graft(s) without angina pectoris: Secondary | ICD-10-CM | POA: Diagnosis not present

## 2017-12-04 DIAGNOSIS — I509 Heart failure, unspecified: Secondary | ICD-10-CM | POA: Diagnosis not present

## 2017-12-04 DIAGNOSIS — I4891 Unspecified atrial fibrillation: Secondary | ICD-10-CM | POA: Diagnosis not present

## 2017-12-06 DIAGNOSIS — G309 Alzheimer's disease, unspecified: Secondary | ICD-10-CM | POA: Diagnosis not present

## 2017-12-06 DIAGNOSIS — I679 Cerebrovascular disease, unspecified: Secondary | ICD-10-CM | POA: Diagnosis not present

## 2017-12-06 DIAGNOSIS — I4891 Unspecified atrial fibrillation: Secondary | ICD-10-CM | POA: Diagnosis not present

## 2017-12-06 DIAGNOSIS — E785 Hyperlipidemia, unspecified: Secondary | ICD-10-CM | POA: Diagnosis not present

## 2017-12-06 DIAGNOSIS — R1310 Dysphagia, unspecified: Secondary | ICD-10-CM | POA: Diagnosis not present

## 2017-12-06 DIAGNOSIS — M2450 Contracture, unspecified joint: Secondary | ICD-10-CM | POA: Diagnosis not present

## 2017-12-06 DIAGNOSIS — N401 Enlarged prostate with lower urinary tract symptoms: Secondary | ICD-10-CM | POA: Diagnosis not present

## 2017-12-06 DIAGNOSIS — N183 Chronic kidney disease, stage 3 (moderate): Secondary | ICD-10-CM | POA: Diagnosis not present

## 2017-12-06 DIAGNOSIS — I25810 Atherosclerosis of coronary artery bypass graft(s) without angina pectoris: Secondary | ICD-10-CM | POA: Diagnosis not present

## 2017-12-06 DIAGNOSIS — I509 Heart failure, unspecified: Secondary | ICD-10-CM | POA: Diagnosis not present

## 2017-12-06 DIAGNOSIS — K219 Gastro-esophageal reflux disease without esophagitis: Secondary | ICD-10-CM | POA: Diagnosis not present

## 2017-12-11 DIAGNOSIS — D649 Anemia, unspecified: Secondary | ICD-10-CM | POA: Diagnosis not present

## 2017-12-11 DIAGNOSIS — I5020 Unspecified systolic (congestive) heart failure: Secondary | ICD-10-CM | POA: Diagnosis not present

## 2017-12-11 DIAGNOSIS — R634 Abnormal weight loss: Secondary | ICD-10-CM | POA: Diagnosis not present

## 2017-12-11 DIAGNOSIS — G309 Alzheimer's disease, unspecified: Secondary | ICD-10-CM | POA: Diagnosis not present

## 2017-12-11 DIAGNOSIS — I679 Cerebrovascular disease, unspecified: Secondary | ICD-10-CM | POA: Diagnosis not present

## 2017-12-11 DIAGNOSIS — N183 Chronic kidney disease, stage 3 (moderate): Secondary | ICD-10-CM | POA: Diagnosis not present

## 2017-12-11 DIAGNOSIS — I25810 Atherosclerosis of coronary artery bypass graft(s) without angina pectoris: Secondary | ICD-10-CM | POA: Diagnosis not present

## 2017-12-11 DIAGNOSIS — I4891 Unspecified atrial fibrillation: Secondary | ICD-10-CM | POA: Diagnosis not present

## 2017-12-11 DIAGNOSIS — I509 Heart failure, unspecified: Secondary | ICD-10-CM | POA: Diagnosis not present

## 2017-12-18 DIAGNOSIS — G309 Alzheimer's disease, unspecified: Secondary | ICD-10-CM | POA: Diagnosis not present

## 2017-12-18 DIAGNOSIS — I25810 Atherosclerosis of coronary artery bypass graft(s) without angina pectoris: Secondary | ICD-10-CM | POA: Diagnosis not present

## 2017-12-18 DIAGNOSIS — I4891 Unspecified atrial fibrillation: Secondary | ICD-10-CM | POA: Diagnosis not present

## 2017-12-18 DIAGNOSIS — I509 Heart failure, unspecified: Secondary | ICD-10-CM | POA: Diagnosis not present

## 2017-12-18 DIAGNOSIS — I679 Cerebrovascular disease, unspecified: Secondary | ICD-10-CM | POA: Diagnosis not present

## 2017-12-18 DIAGNOSIS — N183 Chronic kidney disease, stage 3 (moderate): Secondary | ICD-10-CM | POA: Diagnosis not present

## 2017-12-23 DIAGNOSIS — I509 Heart failure, unspecified: Secondary | ICD-10-CM | POA: Diagnosis not present

## 2017-12-23 DIAGNOSIS — I25810 Atherosclerosis of coronary artery bypass graft(s) without angina pectoris: Secondary | ICD-10-CM | POA: Diagnosis not present

## 2017-12-23 DIAGNOSIS — I679 Cerebrovascular disease, unspecified: Secondary | ICD-10-CM | POA: Diagnosis not present

## 2017-12-23 DIAGNOSIS — I4891 Unspecified atrial fibrillation: Secondary | ICD-10-CM | POA: Diagnosis not present

## 2017-12-23 DIAGNOSIS — G309 Alzheimer's disease, unspecified: Secondary | ICD-10-CM | POA: Diagnosis not present

## 2017-12-23 DIAGNOSIS — N183 Chronic kidney disease, stage 3 (moderate): Secondary | ICD-10-CM | POA: Diagnosis not present

## 2017-12-24 DIAGNOSIS — N183 Chronic kidney disease, stage 3 (moderate): Secondary | ICD-10-CM | POA: Diagnosis not present

## 2017-12-24 DIAGNOSIS — G309 Alzheimer's disease, unspecified: Secondary | ICD-10-CM | POA: Diagnosis not present

## 2017-12-24 DIAGNOSIS — I4891 Unspecified atrial fibrillation: Secondary | ICD-10-CM | POA: Diagnosis not present

## 2017-12-24 DIAGNOSIS — I679 Cerebrovascular disease, unspecified: Secondary | ICD-10-CM | POA: Diagnosis not present

## 2017-12-24 DIAGNOSIS — I509 Heart failure, unspecified: Secondary | ICD-10-CM | POA: Diagnosis not present

## 2017-12-24 DIAGNOSIS — I25810 Atherosclerosis of coronary artery bypass graft(s) without angina pectoris: Secondary | ICD-10-CM | POA: Diagnosis not present

## 2017-12-25 DIAGNOSIS — D649 Anemia, unspecified: Secondary | ICD-10-CM | POA: Diagnosis not present

## 2017-12-25 DIAGNOSIS — R634 Abnormal weight loss: Secondary | ICD-10-CM | POA: Diagnosis not present

## 2017-12-25 DIAGNOSIS — I5020 Unspecified systolic (congestive) heart failure: Secondary | ICD-10-CM | POA: Diagnosis not present

## 2017-12-25 LAB — BASIC METABOLIC PANEL
BUN: 16 (ref 4–21)
Creatinine: 1 (ref 0.6–1.3)
Glucose: 94
Potassium: 4.3 (ref 3.4–5.3)
Sodium: 142 (ref 137–147)

## 2017-12-26 ENCOUNTER — Encounter: Payer: Self-pay | Admitting: Adult Health

## 2017-12-26 ENCOUNTER — Non-Acute Institutional Stay: Payer: Medicare Other | Admitting: Adult Health

## 2017-12-26 DIAGNOSIS — R600 Localized edema: Secondary | ICD-10-CM

## 2017-12-26 NOTE — Progress Notes (Signed)
Location:  Occupational psychologist of Service:  ALF (13) Provider:   Cindi Carbon, ANP [place] [phone]   [name], DO  Patient Care Team: [name], DO as PCP - General (Geriatric Medicine) Community, Well Angelique Holm, Margreta Journey, NP as Nurse Practitioner (Nurse Practitioner)  Extended Emergency Contact Information Primary Emergency Contact: [name] Address: [address]          [place], [place] [address] [place] Phone: [phone] Relation: Spouse Secondary Emergency Contact: [name], [place] Phone: [phone] Relation: Daughter  Code Status:  DNR Goals of care: Advanced Directive information Advanced Directives 11/10/2017  Does Patient Have a Medical Advance Directive? Yes  Type of Paramedic of [place];Living will;Out of facility DNR (pink MOST or yellow form)  Does patient want to make changes to medical advance directive? No - Patient declined  Copy of [place] in Chart? Yes  Pre-existing out of facility DNR order (yellow form or pink MOST form) Yellow form placed in chart (order not valid for inpatient use);Pink MOST form placed in chart (order not valid for inpatient use)     Chief Complaint  Patient presents with  . Acute Visit    pedal edema    HPI:  Pt is a 84 y.o. male seen today for an acute visit for pedal edema.  The caretaker reports his edema is worse and his compression hose are more difficult to get on. He was given 3 days of lasix 6/19-6/21 for weight gain and has lost 1 lb. He is currently had 168 lbs. He has a hx of afib, chronic diastolic chf, and venous insuff. He has advanced dementia and an not contribute to the history. There are no reports of sob, cp, or fever. The staff currently apply compression hose which are too difficult to get on.    Past  Medical History:  Diagnosis Date  . Allergic rhinitis   . Alzheimer's disease 2013  . Atrial fibrillation ([place])    Long term anti coagulation w/ warfarin fro stroke risk reduction  . BPH (benign prostatic hyperplasia)   . Chronic venous insufficiency 01/12/2013  . Dementia with behavioral disturbance 12/29/13  . Gait disorder   . GERD (gastroesophageal reflux disease)   . History of CVA (cerebrovascular accident) 2001   Rt.MCA  . Hyperlipidemia   . Lactose intolerance   . Long term (current) use of anticoagulants 11/23/2012   Long-term anticoagulation with warfarin for stroke risk reduction related to AF   Past Surgical History:  Procedure Laterality Date  . CATARACT EXTRACTION W/ INTRAOCULAR LENS  IMPLANT, BILATERAL    . HERNIA REPAIR Right 1970s  . KNEE SURGERY Right 2000  . MASS EXCISION Left 07/30/2013   Procedure: EXCISION OF BASAL CELL CARCINOMA  LEFT FOREHEAD ;  Surgeon: Irene Limbo, MD;  Location: [facility];  Service: Plastics;  Laterality: Left;  . RETINAL DETACHMENT SURGERY Right 2009    Allergies  Allergen Reactions  . Penicillins Rash  . Bee Venom   . Risperidone And Related     Unstable gait    Outpatient Encounter Medications as of 12/26/2017  Medication Sig  . furosemide (LASIX) 20 MG tablet Take 20 mg by mouth daily.  Marland Kitchen apixaban (ELIQUIS) 2.5 MG TABS tablet Take 2.5 mg by mouth 2 (two) times daily.  Marland Kitchen  bisacodyl (DULCOLAX) 10 MG suppository Place 10 mg rectally every other day.  . ipratropium-albuterol (DUONEB) 0.5-2.5 (3) MG/3ML SOLN Take 3 mLs by nebulization every 6 (six) hours as needed (for wheezing and cough). q 6 hrs prn  . lactose free nutrition (BOOST) LIQD Take 237 mLs by mouth daily.   . nitroGLYCERIN (NITROSTAT) 0.4 MG SL tablet Place 0.4 mg under the tongue every 5 (five) minutes as needed for chest pain.  . polyethylene glycol (MIRALAX / GLYCOLAX) packet Take 17 g by mouth daily as needed.  . potassium chloride SA  (K-DUR,KLOR-CON) 20 MEQ tablet Take 40 mEq by mouth daily.   No facility-administered encounter medications on file as of 12/26/2017.     Review of Systems  Unable to perform ROS: Dementia    Immunization History  Administered Date(s) Administered  . Influenza Inj Mdck Quad Pf 04/25/2016  . Influenza Whole 04/20/2013  . Influenza-Unspecified 04/12/2014, 04/20/2015, 05/01/2017  . PPD Test 11/29/2012  . Pneumococcal Conjugate-13 08/01/2016   Pertinent  Health Maintenance Due  Topic Date Due  . INFLUENZA VACCINE  02/05/2018  . PNA vac Low Risk Adult  Completed   Fall Risk  05/28/2017 06/21/2016  Falls in the past year? No No   Functional Status Survey:    Vitals:   12/26/17 1004  BP: 132/79  Pulse: 89  Resp: 17  Temp: (!) 97.1 F (36.2 C)  SpO2: 90%  Weight: 168 lb (76.2 kg)   Body mass index is 22.78 kg/m. Physical Exam  Constitutional: No distress.  HENT:  Head: Normocephalic and atraumatic.  Mouth/Throat: No oropharyngeal exudate.  Neck: No JVD present.  Cardiovascular: Normal rate.  No murmur heard. Irregular  BLE pedal edema. NO edema to either calf.   Pulmonary/Chest: Effort normal and breath sounds normal. No stridor. No respiratory distress. He has no wheezes.  Abdominal: Soft. Bowel sounds are normal. He exhibits no distension.  Neurological: He is alert.  Skin: Skin is warm and dry. He is not diaphoretic.    Labs reviewed: Recent Labs    10/30/17 11/03/17 12/25/17  NA 142 142 142  K 4.3 4.2 4.3  BUN 30* 27* 16  CREATININE 1.1 1.1 1.0   No results for input(s): AST, ALT, ALKPHOS, BILITOT, PROT, ALBUMIN in the last 8760 hours. Recent Labs    06/23/17 09/11/17  WBC 16.2 7.2  HGB 13.9 14.1  HCT 44 46  PLT 240 212   Lab Results  Component Value Date   TSH 4.17 11/13/2017   No results found for: HGBA1C Lab Results  Component Value Date   CHOL 155 08/31/2015   HDL 33 (A) 08/31/2015   LDLCALC 99 08/31/2015   TRIG 116 08/31/2015     Significant Diagnostic Results in last 30 days:  No results found.  Assessment/Plan  1. Pedal edema No signs of resp distress Variable intake per staff which puts him at risk for dehydration Complete 3 day course of lasix Continue to monitor weights D/C hose  Keep legs elevated in the chair more often and try compression wraps "toes to nose" approach and monitor.   Family/ staff Communication: staff  Labs/tests ordered: BMP q 2 weeks

## 2018-01-02 DIAGNOSIS — I509 Heart failure, unspecified: Secondary | ICD-10-CM | POA: Diagnosis not present

## 2018-01-02 DIAGNOSIS — I4891 Unspecified atrial fibrillation: Secondary | ICD-10-CM | POA: Diagnosis not present

## 2018-01-02 DIAGNOSIS — G309 Alzheimer's disease, unspecified: Secondary | ICD-10-CM | POA: Diagnosis not present

## 2018-01-02 DIAGNOSIS — I25810 Atherosclerosis of coronary artery bypass graft(s) without angina pectoris: Secondary | ICD-10-CM | POA: Diagnosis not present

## 2018-01-02 DIAGNOSIS — I679 Cerebrovascular disease, unspecified: Secondary | ICD-10-CM | POA: Diagnosis not present

## 2018-01-02 DIAGNOSIS — N183 Chronic kidney disease, stage 3 (moderate): Secondary | ICD-10-CM | POA: Diagnosis not present

## 2018-01-05 DIAGNOSIS — R1310 Dysphagia, unspecified: Secondary | ICD-10-CM | POA: Diagnosis not present

## 2018-01-05 DIAGNOSIS — K219 Gastro-esophageal reflux disease without esophagitis: Secondary | ICD-10-CM | POA: Diagnosis not present

## 2018-01-05 DIAGNOSIS — E785 Hyperlipidemia, unspecified: Secondary | ICD-10-CM | POA: Diagnosis not present

## 2018-01-05 DIAGNOSIS — M2450 Contracture, unspecified joint: Secondary | ICD-10-CM | POA: Diagnosis not present

## 2018-01-05 DIAGNOSIS — I509 Heart failure, unspecified: Secondary | ICD-10-CM | POA: Diagnosis not present

## 2018-01-05 DIAGNOSIS — I4891 Unspecified atrial fibrillation: Secondary | ICD-10-CM | POA: Diagnosis not present

## 2018-01-05 DIAGNOSIS — G309 Alzheimer's disease, unspecified: Secondary | ICD-10-CM | POA: Diagnosis not present

## 2018-01-05 DIAGNOSIS — N183 Chronic kidney disease, stage 3 (moderate): Secondary | ICD-10-CM | POA: Diagnosis not present

## 2018-01-05 DIAGNOSIS — N401 Enlarged prostate with lower urinary tract symptoms: Secondary | ICD-10-CM | POA: Diagnosis not present

## 2018-01-05 DIAGNOSIS — I679 Cerebrovascular disease, unspecified: Secondary | ICD-10-CM | POA: Diagnosis not present

## 2018-01-05 DIAGNOSIS — I25810 Atherosclerosis of coronary artery bypass graft(s) without angina pectoris: Secondary | ICD-10-CM | POA: Diagnosis not present

## 2018-01-08 DIAGNOSIS — R634 Abnormal weight loss: Secondary | ICD-10-CM | POA: Diagnosis not present

## 2018-01-08 DIAGNOSIS — D649 Anemia, unspecified: Secondary | ICD-10-CM | POA: Diagnosis not present

## 2018-01-08 DIAGNOSIS — I5020 Unspecified systolic (congestive) heart failure: Secondary | ICD-10-CM | POA: Diagnosis not present

## 2018-01-09 DIAGNOSIS — I679 Cerebrovascular disease, unspecified: Secondary | ICD-10-CM | POA: Diagnosis not present

## 2018-01-09 DIAGNOSIS — G309 Alzheimer's disease, unspecified: Secondary | ICD-10-CM | POA: Diagnosis not present

## 2018-01-09 DIAGNOSIS — I509 Heart failure, unspecified: Secondary | ICD-10-CM | POA: Diagnosis not present

## 2018-01-09 DIAGNOSIS — N183 Chronic kidney disease, stage 3 (moderate): Secondary | ICD-10-CM | POA: Diagnosis not present

## 2018-01-09 DIAGNOSIS — I4891 Unspecified atrial fibrillation: Secondary | ICD-10-CM | POA: Diagnosis not present

## 2018-01-09 DIAGNOSIS — I25810 Atherosclerosis of coronary artery bypass graft(s) without angina pectoris: Secondary | ICD-10-CM | POA: Diagnosis not present

## 2018-01-15 DIAGNOSIS — I25810 Atherosclerosis of coronary artery bypass graft(s) without angina pectoris: Secondary | ICD-10-CM | POA: Diagnosis not present

## 2018-01-15 DIAGNOSIS — I4891 Unspecified atrial fibrillation: Secondary | ICD-10-CM | POA: Diagnosis not present

## 2018-01-15 DIAGNOSIS — I679 Cerebrovascular disease, unspecified: Secondary | ICD-10-CM | POA: Diagnosis not present

## 2018-01-15 DIAGNOSIS — G309 Alzheimer's disease, unspecified: Secondary | ICD-10-CM | POA: Diagnosis not present

## 2018-01-15 DIAGNOSIS — I509 Heart failure, unspecified: Secondary | ICD-10-CM | POA: Diagnosis not present

## 2018-01-15 DIAGNOSIS — N183 Chronic kidney disease, stage 3 (moderate): Secondary | ICD-10-CM | POA: Diagnosis not present

## 2018-01-16 DIAGNOSIS — G309 Alzheimer's disease, unspecified: Secondary | ICD-10-CM | POA: Diagnosis not present

## 2018-01-16 DIAGNOSIS — I25810 Atherosclerosis of coronary artery bypass graft(s) without angina pectoris: Secondary | ICD-10-CM | POA: Diagnosis not present

## 2018-01-16 DIAGNOSIS — I509 Heart failure, unspecified: Secondary | ICD-10-CM | POA: Diagnosis not present

## 2018-01-16 DIAGNOSIS — I4891 Unspecified atrial fibrillation: Secondary | ICD-10-CM | POA: Diagnosis not present

## 2018-01-16 DIAGNOSIS — I679 Cerebrovascular disease, unspecified: Secondary | ICD-10-CM | POA: Diagnosis not present

## 2018-01-16 DIAGNOSIS — N183 Chronic kidney disease, stage 3 (moderate): Secondary | ICD-10-CM | POA: Diagnosis not present

## 2018-01-19 DIAGNOSIS — G309 Alzheimer's disease, unspecified: Secondary | ICD-10-CM | POA: Diagnosis not present

## 2018-01-19 DIAGNOSIS — I25810 Atherosclerosis of coronary artery bypass graft(s) without angina pectoris: Secondary | ICD-10-CM | POA: Diagnosis not present

## 2018-01-19 DIAGNOSIS — I679 Cerebrovascular disease, unspecified: Secondary | ICD-10-CM | POA: Diagnosis not present

## 2018-01-19 DIAGNOSIS — N183 Chronic kidney disease, stage 3 (moderate): Secondary | ICD-10-CM | POA: Diagnosis not present

## 2018-01-19 DIAGNOSIS — I509 Heart failure, unspecified: Secondary | ICD-10-CM | POA: Diagnosis not present

## 2018-01-19 DIAGNOSIS — I4891 Unspecified atrial fibrillation: Secondary | ICD-10-CM | POA: Diagnosis not present

## 2018-01-22 DIAGNOSIS — I5020 Unspecified systolic (congestive) heart failure: Secondary | ICD-10-CM | POA: Diagnosis not present

## 2018-01-22 DIAGNOSIS — D649 Anemia, unspecified: Secondary | ICD-10-CM | POA: Diagnosis not present

## 2018-01-22 DIAGNOSIS — R634 Abnormal weight loss: Secondary | ICD-10-CM | POA: Diagnosis not present

## 2018-01-22 LAB — BASIC METABOLIC PANEL
BUN: 25 — AB (ref 4–21)
CREATININE: 0.9 (ref 0.6–1.3)
GLUCOSE: 100
Potassium: 4.2 (ref 3.4–5.3)
Sodium: 143 (ref 137–147)

## 2018-01-23 ENCOUNTER — Encounter: Payer: Self-pay | Admitting: Internal Medicine

## 2018-01-23 ENCOUNTER — Telehealth: Payer: Self-pay | Admitting: Internal Medicine

## 2018-01-23 NOTE — Telephone Encounter (Signed)
I received the following email in my [facility] email:  Tongela Encinas  My name is Enos Muhl Ilona Sorrel Troop's daughter).  I handle most of my parents financial activities and they recently changed their checking account to [place] from [employer] [facility] [employer].  I went to [employer] to have his payment transferred to the new account.  Before they can help me, I need a doctor's letter stating that Dad has Alzheimer's disease and is not able to physically go to [employer] and make the transfer himself.  Can you please provide this letter to Mom Margy Clarks) and I will get it from her during one of my many visits.  Thanks Mizraim Harmening [email or web address] [phone]

## 2018-01-23 NOTE — Telephone Encounter (Signed)
Letter completed and printed.  I will bring it to [place] on Tuesday to be given to Mrs. Margy Clarks.

## 2018-01-26 ENCOUNTER — Non-Acute Institutional Stay: Payer: Medicare Other | Admitting: Adult Health

## 2018-01-26 ENCOUNTER — Encounter: Payer: Self-pay | Admitting: Adult Health

## 2018-01-26 DIAGNOSIS — N183 Chronic kidney disease, stage 3 unspecified: Secondary | ICD-10-CM

## 2018-01-26 DIAGNOSIS — F0281 Dementia in other diseases classified elsewhere with behavioral disturbance: Secondary | ICD-10-CM | POA: Diagnosis not present

## 2018-01-26 DIAGNOSIS — E876 Hypokalemia: Secondary | ICD-10-CM | POA: Diagnosis not present

## 2018-01-26 DIAGNOSIS — R634 Abnormal weight loss: Secondary | ICD-10-CM

## 2018-01-26 DIAGNOSIS — R339 Retention of urine, unspecified: Secondary | ICD-10-CM

## 2018-01-26 DIAGNOSIS — R1310 Dysphagia, unspecified: Secondary | ICD-10-CM

## 2018-01-26 DIAGNOSIS — G301 Alzheimer's disease with late onset: Secondary | ICD-10-CM | POA: Diagnosis not present

## 2018-01-26 DIAGNOSIS — I482 Chronic atrial fibrillation: Secondary | ICD-10-CM

## 2018-01-26 DIAGNOSIS — I4821 Permanent atrial fibrillation: Secondary | ICD-10-CM

## 2018-01-26 NOTE — Assessment & Plan Note (Signed)
Continue Kdur 40 meq qd

## 2018-01-26 NOTE — Progress Notes (Signed)
Location:  Occupational psychologist of Service:  ALF (13) Provider:   Cindi Carbon, ANP [place] [phone]  Bryan Curry, DO  Patient Care Team: Bryan Curry, DO as PCP - General (Geriatric Medicine) Community, Well Bryan [name], Bryan Journey, NP as Nurse Practitioner (Nurse Practitioner)  Extended Emergency Contact Information Primary Emergency Contact: Bryan [name] Address: [address]          [place], [place] [address] [place] Phone: [phone] Relation: Spouse Secondary Emergency Contact: Bryan [name], [place] Phone: [phone] Relation: Daughter  Code Status:  DNR Goals of care: Advanced Directive information Advanced Directives 11/10/2017  Does Patient Have a Medical Advance Directive? Yes  Type of Paramedic of [place];Living will;Out of facility DNR (pink MOST or yellow form)  Does patient want to make changes to medical advance directive? No - Patient declined  Copy of [place] in Chart? Yes  Pre-existing out of facility DNR order (yellow form or pink MOST form) Yellow form placed in chart (order not valid for inpatient use);Pink MOST form placed in chart (order not valid for inpatient use)     Chief Complaint  Patient presents with  . Medical Management of Chronic Issues    HPI:  Pt is a 84 y.o. male seen today for medical management of chronic diseases.  He resides in memory care due to advanced Alzheimer's dementia.   AD: non verbal and not able to perform for MMSE FAST stage 7 No longer on meds due to [name] of benefit Followed by hospice  Dysphagia: puree diet and NTL. NO reports of cough/congestion  Urinary rentention due to BPH: foley with good output, tends to be cloudy.  No issues with dysuria or retention Skin irritation at stat lock site  AFIB: rate controlled, remains  on eliquis for CVA risk reduction  CKD FIE:PPIR 56.11 ml/min Lab Results  Component Value Date   BUN 25 (A) 01/22/2018   Lab Results  Component Value Date   CREATININE 0.9 01/22/2018   Weight loss: progressive due to decreased intake Takes boost daily Wt Readings from Last 3 Encounters:  01/26/18 166 lb (75.3 kg)  12/26/17 168 lb (76.2 kg)  11/10/17 162 lb 12.8 oz (73.8 kg)  Weight now monitored twice weekly  Hypokalemia:takes kdur 40 meq Lab Results  Component Value Date   K 4.2 01/22/2018     Past Medical History:  Diagnosis Date  . Allergic rhinitis   . Alzheimer's disease 2013  . Atrial fibrillation ([facility])    Long term anti coagulation w/ warfarin fro stroke risk reduction  . BPH (benign prostatic hyperplasia)   . Chronic venous insufficiency 01/12/2013  . Dementia with behavioral disturbance 12/29/13  . Gait disorder   . GERD (gastroesophageal reflux disease)   . History of CVA (cerebrovascular accident) 2001   Rt.MCA  . Hyperlipidemia   . Lactose intolerance   . Long term (current) use of anticoagulants 11/23/2012   Long-term anticoagulation with warfarin for stroke risk reduction related to AF   Past Surgical History:  Procedure Laterality Date  . CATARACT EXTRACTION W/ INTRAOCULAR LENS  IMPLANT, BILATERAL    . HERNIA REPAIR Right 1970s  . KNEE SURGERY Right 2000  . MASS EXCISION Left 07/30/2013   Procedure: EXCISION OF BASAL CELL CARCINOMA  LEFT FOREHEAD ;  Surgeon: Irene Limbo, MD;  Location:  [facility];  Service: Plastics;  Laterality: Left;  . RETINAL DETACHMENT SURGERY Right 2009    Allergies  Allergen Reactions  . Penicillins Rash  . Bee Venom   . Risperidone And Related     Unstable gait    Outpatient Encounter Medications as of 01/26/2018  Medication Sig  . apixaban (ELIQUIS) 2.5 MG TABS tablet Take 2.5 mg by mouth 2 (two) times daily.  Marland Kitchen ipratropium-albuterol (DUONEB) 0.5-2.5 (3) MG/3ML SOLN Take 3 mLs by nebulization every  6 (six) hours as needed (for wheezing and cough). q 6 hrs prn  . lactose free nutrition (BOOST) LIQD Take 237 mLs by mouth daily.   . nitroGLYCERIN (NITROSTAT) 0.4 MG SL tablet Place 0.4 mg under the tongue every 5 (five) minutes as needed for chest pain.  . polyethylene glycol (MIRALAX / GLYCOLAX) packet Take 17 g by mouth daily as needed.  . potassium chloride SA (K-DUR,KLOR-CON) 20 MEQ tablet Take 40 mEq by mouth daily.  . [DISCONTINUED] bisacodyl (DULCOLAX) 10 MG suppository Place 10 mg rectally every other day.   No facility-administered encounter medications on file as of 01/26/2018.     Review of Systems  Unable to perform ROS: Dementia    Immunization History  Administered Date(s) Administered  . Influenza Inj Mdck Quad Pf 04/25/2016  . Influenza Whole 04/20/2013  . Influenza-Unspecified 04/12/2014, 04/20/2015, 05/01/2017  . PPD Test 11/29/2012  . Pneumococcal Conjugate-13 08/01/2016   Pertinent  Health Maintenance Due  Topic Date Due  . INFLUENZA VACCINE  02/05/2018  . PNA vac Low Risk Adult  Completed   Fall Risk  05/28/2017 06/21/2016  Falls in the past year? No No   Functional Status Survey:    Vitals:   01/26/18 1606  Weight: 166 lb (75.3 kg)   Body mass index is 22.51 kg/m. Physical Exam  Constitutional: No distress.  Frail and thin  HENT:  Head: Normocephalic and atraumatic.  Neck: No JVD present.  Cardiovascular: Normal rate and regular rhythm.  No murmur heard. Distance heart sounds  Pulmonary/Chest: Effort normal and breath sounds normal. No respiratory distress. He has no wheezes.  Abdominal: Soft. Bowel sounds are normal. He exhibits no distension. There is no tenderness.  Genitourinary:  Genitourinary Comments: Cloudy urine. Penis with hypospadias. No redness or purulent drainage  Musculoskeletal:  Decreased ROM to both arms and legs  Lymphadenopathy:    He has no cervical adenopathy.  Neurological: He is alert.  Not verbal or able to f/c    Skin: Skin is warm and dry. No rash noted. He is not diaphoretic. There is erythema (right inner thigh). No pallor.  Nursing note and vitals reviewed.   Labs reviewed: Recent Labs    11/03/17 12/25/17 01/22/18  NA 142 142 143  K 4.2 4.3 4.2  BUN 27* 16 25*  CREATININE 1.1 1.0 0.9   No results for input(s): AST, ALT, ALKPHOS, BILITOT, PROT, ALBUMIN in the last 8760 hours. Recent Labs    06/23/17 09/11/17  WBC 16.2 7.2  HGB 13.9 14.1  HCT 44 46  PLT 240 212   Lab Results  Component Value Date   TSH 4.17 11/13/2017   No results found for: HGBA1C Lab Results  Component Value Date   CHOL 155 08/31/2015   HDL 33 (A) 08/31/2015   LDLCALC 99 08/31/2015   TRIG 116 08/31/2015    Significant Diagnostic Results in last 30 days:  No results found.  Assessment/Plan    Family/ staff Communication: staff/resident  Labs/tests ordered:  BMP q 2 weeks per family request

## 2018-01-26 NOTE — Assessment & Plan Note (Signed)
Continue modified diet and asp prec

## 2018-01-26 NOTE — Assessment & Plan Note (Signed)
Continue to periodically monitor BMP and avoid nephrotoxic agents

## 2018-01-26 NOTE — Assessment & Plan Note (Signed)
Nearing end stage disease and followed by hospice Continue supportive care

## 2018-01-26 NOTE — Assessment & Plan Note (Signed)
Rate controlled without medications Continue eliquis for CVA risk reduction. Not clear if he benefits from this medication. His family would like to continue it to prevent further decline associated with a stroke.

## 2018-01-26 NOTE — Assessment & Plan Note (Signed)
Continue foley catheter with care provided [facility].

## 2018-01-26 NOTE — Assessment & Plan Note (Signed)
Continues to progressively lose weight due to his dementia, dysphagia, and poor intake. Continue boost, no aggressive care of feeding tubes

## 2018-01-29 DIAGNOSIS — I4891 Unspecified atrial fibrillation: Secondary | ICD-10-CM | POA: Diagnosis not present

## 2018-01-29 DIAGNOSIS — I25810 Atherosclerosis of coronary artery bypass graft(s) without angina pectoris: Secondary | ICD-10-CM | POA: Diagnosis not present

## 2018-01-29 DIAGNOSIS — G309 Alzheimer's disease, unspecified: Secondary | ICD-10-CM | POA: Diagnosis not present

## 2018-01-29 DIAGNOSIS — N183 Chronic kidney disease, stage 3 (moderate): Secondary | ICD-10-CM | POA: Diagnosis not present

## 2018-01-29 DIAGNOSIS — I679 Cerebrovascular disease, unspecified: Secondary | ICD-10-CM | POA: Diagnosis not present

## 2018-01-29 DIAGNOSIS — I509 Heart failure, unspecified: Secondary | ICD-10-CM | POA: Diagnosis not present

## 2018-02-05 DIAGNOSIS — I25810 Atherosclerosis of coronary artery bypass graft(s) without angina pectoris: Secondary | ICD-10-CM | POA: Diagnosis not present

## 2018-02-05 DIAGNOSIS — E785 Hyperlipidemia, unspecified: Secondary | ICD-10-CM | POA: Diagnosis not present

## 2018-02-05 DIAGNOSIS — R1310 Dysphagia, unspecified: Secondary | ICD-10-CM | POA: Diagnosis not present

## 2018-02-05 DIAGNOSIS — D649 Anemia, unspecified: Secondary | ICD-10-CM | POA: Diagnosis not present

## 2018-02-05 DIAGNOSIS — N401 Enlarged prostate with lower urinary tract symptoms: Secondary | ICD-10-CM | POA: Diagnosis not present

## 2018-02-05 DIAGNOSIS — K219 Gastro-esophageal reflux disease without esophagitis: Secondary | ICD-10-CM | POA: Diagnosis not present

## 2018-02-05 DIAGNOSIS — I509 Heart failure, unspecified: Secondary | ICD-10-CM | POA: Diagnosis not present

## 2018-02-05 DIAGNOSIS — I5020 Unspecified systolic (congestive) heart failure: Secondary | ICD-10-CM | POA: Diagnosis not present

## 2018-02-05 DIAGNOSIS — R634 Abnormal weight loss: Secondary | ICD-10-CM | POA: Diagnosis not present

## 2018-02-05 DIAGNOSIS — I679 Cerebrovascular disease, unspecified: Secondary | ICD-10-CM | POA: Diagnosis not present

## 2018-02-05 DIAGNOSIS — G309 Alzheimer's disease, unspecified: Secondary | ICD-10-CM | POA: Diagnosis not present

## 2018-02-05 DIAGNOSIS — M2450 Contracture, unspecified joint: Secondary | ICD-10-CM | POA: Diagnosis not present

## 2018-02-05 DIAGNOSIS — N183 Chronic kidney disease, stage 3 (moderate): Secondary | ICD-10-CM | POA: Diagnosis not present

## 2018-02-05 DIAGNOSIS — I4891 Unspecified atrial fibrillation: Secondary | ICD-10-CM | POA: Diagnosis not present

## 2018-02-12 DIAGNOSIS — G309 Alzheimer's disease, unspecified: Secondary | ICD-10-CM | POA: Diagnosis not present

## 2018-02-12 DIAGNOSIS — N183 Chronic kidney disease, stage 3 (moderate): Secondary | ICD-10-CM | POA: Diagnosis not present

## 2018-02-12 DIAGNOSIS — I4891 Unspecified atrial fibrillation: Secondary | ICD-10-CM | POA: Diagnosis not present

## 2018-02-12 DIAGNOSIS — I509 Heart failure, unspecified: Secondary | ICD-10-CM | POA: Diagnosis not present

## 2018-02-12 DIAGNOSIS — I679 Cerebrovascular disease, unspecified: Secondary | ICD-10-CM | POA: Diagnosis not present

## 2018-02-12 DIAGNOSIS — I25810 Atherosclerosis of coronary artery bypass graft(s) without angina pectoris: Secondary | ICD-10-CM | POA: Diagnosis not present

## 2018-02-17 DIAGNOSIS — N183 Chronic kidney disease, stage 3 (moderate): Secondary | ICD-10-CM | POA: Diagnosis not present

## 2018-02-17 DIAGNOSIS — I25810 Atherosclerosis of coronary artery bypass graft(s) without angina pectoris: Secondary | ICD-10-CM | POA: Diagnosis not present

## 2018-02-17 DIAGNOSIS — G309 Alzheimer's disease, unspecified: Secondary | ICD-10-CM | POA: Diagnosis not present

## 2018-02-17 DIAGNOSIS — I509 Heart failure, unspecified: Secondary | ICD-10-CM | POA: Diagnosis not present

## 2018-02-17 DIAGNOSIS — I4891 Unspecified atrial fibrillation: Secondary | ICD-10-CM | POA: Diagnosis not present

## 2018-02-17 DIAGNOSIS — I679 Cerebrovascular disease, unspecified: Secondary | ICD-10-CM | POA: Diagnosis not present

## 2018-02-19 DIAGNOSIS — D649 Anemia, unspecified: Secondary | ICD-10-CM | POA: Diagnosis not present

## 2018-02-19 DIAGNOSIS — R634 Abnormal weight loss: Secondary | ICD-10-CM | POA: Diagnosis not present

## 2018-02-19 DIAGNOSIS — I5020 Unspecified systolic (congestive) heart failure: Secondary | ICD-10-CM | POA: Diagnosis not present

## 2018-02-26 DIAGNOSIS — I509 Heart failure, unspecified: Secondary | ICD-10-CM | POA: Diagnosis not present

## 2018-02-26 DIAGNOSIS — I679 Cerebrovascular disease, unspecified: Secondary | ICD-10-CM | POA: Diagnosis not present

## 2018-02-26 DIAGNOSIS — I4891 Unspecified atrial fibrillation: Secondary | ICD-10-CM | POA: Diagnosis not present

## 2018-02-26 DIAGNOSIS — N183 Chronic kidney disease, stage 3 (moderate): Secondary | ICD-10-CM | POA: Diagnosis not present

## 2018-02-26 DIAGNOSIS — G309 Alzheimer's disease, unspecified: Secondary | ICD-10-CM | POA: Diagnosis not present

## 2018-02-26 DIAGNOSIS — I25810 Atherosclerosis of coronary artery bypass graft(s) without angina pectoris: Secondary | ICD-10-CM | POA: Diagnosis not present

## 2018-03-03 DIAGNOSIS — N183 Chronic kidney disease, stage 3 (moderate): Secondary | ICD-10-CM | POA: Diagnosis not present

## 2018-03-03 DIAGNOSIS — G309 Alzheimer's disease, unspecified: Secondary | ICD-10-CM | POA: Diagnosis not present

## 2018-03-03 DIAGNOSIS — I679 Cerebrovascular disease, unspecified: Secondary | ICD-10-CM | POA: Diagnosis not present

## 2018-03-03 DIAGNOSIS — I4891 Unspecified atrial fibrillation: Secondary | ICD-10-CM | POA: Diagnosis not present

## 2018-03-03 DIAGNOSIS — I25810 Atherosclerosis of coronary artery bypass graft(s) without angina pectoris: Secondary | ICD-10-CM | POA: Diagnosis not present

## 2018-03-03 DIAGNOSIS — I509 Heart failure, unspecified: Secondary | ICD-10-CM | POA: Diagnosis not present

## 2018-03-05 DIAGNOSIS — D649 Anemia, unspecified: Secondary | ICD-10-CM | POA: Diagnosis not present

## 2018-03-05 DIAGNOSIS — R634 Abnormal weight loss: Secondary | ICD-10-CM | POA: Diagnosis not present

## 2018-03-05 DIAGNOSIS — I5020 Unspecified systolic (congestive) heart failure: Secondary | ICD-10-CM | POA: Diagnosis not present

## 2018-03-05 LAB — BASIC METABOLIC PANEL
BUN: 17 (ref 4–21)
CREATININE: 1.1 (ref 0.6–1.3)
Glucose: 93
POTASSIUM: 4 (ref 3.4–5.3)
Sodium: 140 (ref 137–147)

## 2018-03-06 DIAGNOSIS — I679 Cerebrovascular disease, unspecified: Secondary | ICD-10-CM | POA: Diagnosis not present

## 2018-03-06 DIAGNOSIS — I25810 Atherosclerosis of coronary artery bypass graft(s) without angina pectoris: Secondary | ICD-10-CM | POA: Diagnosis not present

## 2018-03-06 DIAGNOSIS — I4891 Unspecified atrial fibrillation: Secondary | ICD-10-CM | POA: Diagnosis not present

## 2018-03-06 DIAGNOSIS — N183 Chronic kidney disease, stage 3 (moderate): Secondary | ICD-10-CM | POA: Diagnosis not present

## 2018-03-06 DIAGNOSIS — I509 Heart failure, unspecified: Secondary | ICD-10-CM | POA: Diagnosis not present

## 2018-03-06 DIAGNOSIS — G309 Alzheimer's disease, unspecified: Secondary | ICD-10-CM | POA: Diagnosis not present

## 2018-03-08 DIAGNOSIS — N183 Chronic kidney disease, stage 3 (moderate): Secondary | ICD-10-CM | POA: Diagnosis not present

## 2018-03-08 DIAGNOSIS — M2450 Contracture, unspecified joint: Secondary | ICD-10-CM | POA: Diagnosis not present

## 2018-03-08 DIAGNOSIS — K219 Gastro-esophageal reflux disease without esophagitis: Secondary | ICD-10-CM | POA: Diagnosis not present

## 2018-03-08 DIAGNOSIS — I25810 Atherosclerosis of coronary artery bypass graft(s) without angina pectoris: Secondary | ICD-10-CM | POA: Diagnosis not present

## 2018-03-08 DIAGNOSIS — R1310 Dysphagia, unspecified: Secondary | ICD-10-CM | POA: Diagnosis not present

## 2018-03-08 DIAGNOSIS — I509 Heart failure, unspecified: Secondary | ICD-10-CM | POA: Diagnosis not present

## 2018-03-08 DIAGNOSIS — G309 Alzheimer's disease, unspecified: Secondary | ICD-10-CM | POA: Diagnosis not present

## 2018-03-08 DIAGNOSIS — I4891 Unspecified atrial fibrillation: Secondary | ICD-10-CM | POA: Diagnosis not present

## 2018-03-08 DIAGNOSIS — N401 Enlarged prostate with lower urinary tract symptoms: Secondary | ICD-10-CM | POA: Diagnosis not present

## 2018-03-08 DIAGNOSIS — I679 Cerebrovascular disease, unspecified: Secondary | ICD-10-CM | POA: Diagnosis not present

## 2018-03-08 DIAGNOSIS — E785 Hyperlipidemia, unspecified: Secondary | ICD-10-CM | POA: Diagnosis not present

## 2018-03-12 DIAGNOSIS — G309 Alzheimer's disease, unspecified: Secondary | ICD-10-CM | POA: Diagnosis not present

## 2018-03-12 DIAGNOSIS — N183 Chronic kidney disease, stage 3 (moderate): Secondary | ICD-10-CM | POA: Diagnosis not present

## 2018-03-12 DIAGNOSIS — I4891 Unspecified atrial fibrillation: Secondary | ICD-10-CM | POA: Diagnosis not present

## 2018-03-12 DIAGNOSIS — I25810 Atherosclerosis of coronary artery bypass graft(s) without angina pectoris: Secondary | ICD-10-CM | POA: Diagnosis not present

## 2018-03-12 DIAGNOSIS — I509 Heart failure, unspecified: Secondary | ICD-10-CM | POA: Diagnosis not present

## 2018-03-12 DIAGNOSIS — I679 Cerebrovascular disease, unspecified: Secondary | ICD-10-CM | POA: Diagnosis not present

## 2018-03-13 DIAGNOSIS — I509 Heart failure, unspecified: Secondary | ICD-10-CM | POA: Diagnosis not present

## 2018-03-13 DIAGNOSIS — I679 Cerebrovascular disease, unspecified: Secondary | ICD-10-CM | POA: Diagnosis not present

## 2018-03-13 DIAGNOSIS — I25810 Atherosclerosis of coronary artery bypass graft(s) without angina pectoris: Secondary | ICD-10-CM | POA: Diagnosis not present

## 2018-03-13 DIAGNOSIS — I4891 Unspecified atrial fibrillation: Secondary | ICD-10-CM | POA: Diagnosis not present

## 2018-03-13 DIAGNOSIS — G309 Alzheimer's disease, unspecified: Secondary | ICD-10-CM | POA: Diagnosis not present

## 2018-03-13 DIAGNOSIS — N183 Chronic kidney disease, stage 3 (moderate): Secondary | ICD-10-CM | POA: Diagnosis not present

## 2018-03-18 DIAGNOSIS — I4891 Unspecified atrial fibrillation: Secondary | ICD-10-CM | POA: Diagnosis not present

## 2018-03-18 DIAGNOSIS — N183 Chronic kidney disease, stage 3 (moderate): Secondary | ICD-10-CM | POA: Diagnosis not present

## 2018-03-18 DIAGNOSIS — I509 Heart failure, unspecified: Secondary | ICD-10-CM | POA: Diagnosis not present

## 2018-03-18 DIAGNOSIS — I679 Cerebrovascular disease, unspecified: Secondary | ICD-10-CM | POA: Diagnosis not present

## 2018-03-18 DIAGNOSIS — G309 Alzheimer's disease, unspecified: Secondary | ICD-10-CM | POA: Diagnosis not present

## 2018-03-18 DIAGNOSIS — I25810 Atherosclerosis of coronary artery bypass graft(s) without angina pectoris: Secondary | ICD-10-CM | POA: Diagnosis not present

## 2018-03-19 DIAGNOSIS — D649 Anemia, unspecified: Secondary | ICD-10-CM | POA: Diagnosis not present

## 2018-03-19 DIAGNOSIS — R634 Abnormal weight loss: Secondary | ICD-10-CM | POA: Diagnosis not present

## 2018-03-19 DIAGNOSIS — I5020 Unspecified systolic (congestive) heart failure: Secondary | ICD-10-CM | POA: Diagnosis not present

## 2018-03-19 LAB — BASIC METABOLIC PANEL
BUN: 18 (ref 4–21)
Creatinine: 1 (ref 0.6–1.3)
Glucose: 92
Potassium: 4.7 (ref 3.4–5.3)
Sodium: 139 (ref 137–147)

## 2018-03-23 ENCOUNTER — Non-Acute Institutional Stay: Payer: Medicare Other | Admitting: Adult Health

## 2018-03-23 DIAGNOSIS — E440 Moderate protein-calorie malnutrition: Secondary | ICD-10-CM | POA: Diagnosis not present

## 2018-03-23 DIAGNOSIS — F0281 Dementia in other diseases classified elsewhere with behavioral disturbance: Secondary | ICD-10-CM | POA: Diagnosis not present

## 2018-03-23 DIAGNOSIS — I5032 Chronic diastolic (congestive) heart failure: Secondary | ICD-10-CM

## 2018-03-23 DIAGNOSIS — N183 Chronic kidney disease, stage 3 unspecified: Secondary | ICD-10-CM

## 2018-03-23 DIAGNOSIS — I4821 Permanent atrial fibrillation: Secondary | ICD-10-CM

## 2018-03-23 DIAGNOSIS — R1310 Dysphagia, unspecified: Secondary | ICD-10-CM

## 2018-03-23 DIAGNOSIS — I482 Chronic atrial fibrillation: Secondary | ICD-10-CM | POA: Diagnosis not present

## 2018-03-23 DIAGNOSIS — R339 Retention of urine, unspecified: Secondary | ICD-10-CM | POA: Diagnosis not present

## 2018-03-23 DIAGNOSIS — I872 Venous insufficiency (chronic) (peripheral): Secondary | ICD-10-CM | POA: Diagnosis not present

## 2018-03-23 DIAGNOSIS — G301 Alzheimer's disease with late onset: Secondary | ICD-10-CM | POA: Diagnosis not present

## 2018-03-25 ENCOUNTER — Encounter: Payer: Self-pay | Admitting: Adult Health

## 2018-03-25 NOTE — Progress Notes (Signed)
Location:  [facility]upational psychologist of Service:  ALF (13) Provider:   Cindi Carbon, ANP [place] [phone]   [name], DO  Patient Care Team: [name], DO as PCP - General (Geriatric Medicine) [facility], [facility] Angel[facility] Holm, Margreta Journey, NP as Nurse Practitioner (Nurse Practitioner)  Extended Emergency Contact Information Primary Emergency Contact: [name] Address: [address]          [place], [place] [address] [place] Phone: [phone] Relation: Spouse Secondary Emergency Contact: [name], [place] Phone: [phone] Relation: Daughter  Code Status:  DNR Goals of care: Advanced Directive information Advanced Directives 11/10/2017  Does Patient Have a Medical Advance Directive? Yes  Type of Paramedic of [place];Living will;Out of facility DNR (pink MOST or yellow form)  Does patient want to make changes to medical advance directive? No - Patient declined  Copy of [place] in Chart? Yes  Pre-existing out of facility DNR order (yellow form or pink MOST form) Yellow form placed in chart (order not valid for inpatient use);Pink MOST form placed in chart (order not valid for inpatient use)     Chief Complaint  Patient presents with  . Medical Management of Chronic Issues    HPI:  Pt is a 84 y.o. male seen today for medical management of chronic diseases.   He resides in the memory care unit. There are no complaints regarding his care for the visit. He was just placed in bed via the hoyer lift for my visit and is resting comfortable.   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 He is end stage and requires assistance for all ADLs. He is incontinent, non verbal, and not ambulatory. Followed by hospice  Dysphagia: continues to cough periodically when he is finished eating. No fevers or decreased 02 sats.   Past Medical History:  Diagnosis Date  . Allergic rhinitis   . Alzheimer's disease 2013  . Atrial fibrillation ([place])    Long term anti coagulation w/ warfarin fro stroke risk reduction  . BPH (benign prostatic hyperplasia)   . Chronic venous insufficiency 01/12/2013  . Dementia with behavioral disturbance 12/29/13  . Gait disorder   . GERD (gastroesophageal reflux disease)   . History of CVA (cerebrovascular accident) 2001   Rt.MCA  . Hyperlipidemia   . Lactose intolerance   . Long term (current) use of anticoagulants 11/23/2012   Long-term anticoagulation with warfarin for stroke risk reduction related to AF   Past Surgical History:  Procedure Laterality Date  . CATARACT EXTRACTION W/ INTRAOCULAR LENS  IMPLANT, BILATERAL    . HERNIA REPAIR Right 1970s  . KNEE SURGERY Right 2000  .  MASS EXCISION Left 07/30/2013   Procedure: EXCISION OF BASAL CELL CARCINOMA  LEFT FOREHEAD ;  Surgeon: Irene Limbo, MD;  Location: [facility];  Service: Plastics;  Laterality: Left;  . RETINAL DETACHMENT SURGERY Right 2009    Allergies  Allergen Reactions  . Penicillins Rash  . Bee Venom   . Risperidone And Related     Unstable gait    Outpatient Encounter Medications as of 03/23/2018  Medication Sig  . apixaban (ELIQUIS) 2.5 MG TABS tablet Take 2.5 mg by mouth 2  (two) times daily.  Marland Kitchen ipratropium-albuterol (DUONEB) 0.5-2.5 (3) MG/3ML SOLN Take 3 mLs by nebulization every 6 (six) hours as needed (for wheezing and cough). q 6 hrs prn  . lactose free nutrition (BOOST) LIQD Take 237 mLs by mouth daily.   . nitroGLYCERIN (NITROSTAT) 0.4 MG SL tablet Place 0.4 mg under the tongue every 5 (five) minutes as needed for chest pain.  . polyethylene glycol (MIRALAX / GLYCOLAX) packet Take 17 g by mouth daily as needed.  . potassium chloride SA (K-DUR,KLOR-CON) 20 MEQ tablet Take 40 mEq by mouth daily.   No facility-administered encounter medications on file as of 03/23/2018.     Review of Systems  Unable to perform ROS: Dementia    Immunization History  Administered Date(s) Administered  . Influenza Inj Mdck Quad Pf 04/25/2016  . Influenza Whole 04/20/2013  . Influenza-Unspecified 04/12/2014, 04/20/2015, 05/01/2017  . PPD Test 11/29/2012  . Pneumococcal Conjugate-13 08/01/2016   Pertinent  Health Maintenance Due  Topic Date Due  . INFLUENZA VACCINE  02/05/2018  . PNA vac Low Risk Adult  Completed   Fall Risk  05/28/2017 06/21/2016  Falls in the past year? No No   Functional Status Survey:    Vitals:   03/25/18 0623  BP: (!) 132/96  Pulse: 61  Resp: 18  Temp: (!) 96.6 F (35.9 C)  SpO2: 99%  Weight: 167 lb (75.8 kg)   Body mass index is 22.65 kg/m.  Wt Readings from Last 3 Encounters:  03/25/18 167 lb (75.8 kg)  01/26/18 166 lb (75.3 kg)  12/26/17 168 lb (76.2 kg)   Physical Exam  Constitutional: No distress.  HENT:  Head: Normocephalic and atraumatic.  Neck: No JVD present. No tracheal deviation present. No thyromegaly present.  Cardiovascular: Normal rate.  No murmur heard. Irregular. Edema to pedal area 2+  Pulmonary/Chest: Effort normal and breath sounds normal. No respiratory distress. He has no wheezes.  Abdominal: Soft. Bowel sounds are normal. He exhibits no distension. There is no tenderness.  Genitourinary: Penis  normal.  Musculoskeletal: He exhibits no edema or tenderness.  Rigidity to BUE and BLE  Lymphadenopathy:    He has no cervical adenopathy.  Neurological: He is alert.  Not verbal and not able to f/c  Skin: Skin is warm and dry. He is not diaphoretic.  Right groin area with dressing in place, wound is mostly healed with small amt of pink tissue noted   Psychiatric: He has a normal mood and affect.  Nursing note and vitals reviewed.   Labs reviewed: Recent Labs    12/25/17 01/22/18 03/05/18  NA 142 143 140  K 4.3 4.2 4.0  BUN 16 25* 17  CREATININE 1.0 0.9 1.1   No results for input(s): AST, ALT, ALKPHOS, BILITOT, PROT, ALBUMIN in the last 8760 hours. Recent Labs    06/23/17 09/11/17  WBC 16.2 7.2  HGB 13.9 14.1  HCT 44 46  PLT 240 212  Lab Results  Component Value Date   TSH 4.17 11/13/2017   No results found for: HGBA1C Lab Results  Component Value Date   CHOL 155 08/31/2015   HDL 33 (A) 08/31/2015   LDLCALC 99 08/31/2015   TRIG 116 08/31/2015    Significant Diagnostic Results in last 30 days:  No results found.  Assessment/Plan  1. Permanent atrial fibrillation (HCC) Continue Eliquis 2.5 mg BID Rate controlled without medicaitons  2. Chronic venous insufficiency Edema concentrated around the pedal area  Continue leg elevation and compression hose  3. Chronic diastolic congestive heart failure ([place]) Compensated  4. Dysphagia, unspecified type Continue puree diet with NTL Asp prec  5. Late onset Alzheimer's disease with behavioral disturbance With a component of vascular  Severe in nature with dependency on staff for all ADLs Comfort care followed by hospice  6. CKD (chronic kidney disease) stage 3, GFR 30-59 ml/min (HCC) Continue to periodically monitor BMP and avoid nephrotoxic agents  7. Urinary retention Due to BPH [facility]   8. Moderate protein-calorie malnutrition ([place]) Continue Boost and calorie dense meals  as indicated by dietician   Family/ staff Communication: staff/caretaker  Labs/tests ordered:  No new

## 2018-03-26 DIAGNOSIS — N183 Chronic kidney disease, stage 3 (moderate): Secondary | ICD-10-CM | POA: Diagnosis not present

## 2018-03-26 DIAGNOSIS — I509 Heart failure, unspecified: Secondary | ICD-10-CM | POA: Diagnosis not present

## 2018-03-26 DIAGNOSIS — G309 Alzheimer's disease, unspecified: Secondary | ICD-10-CM | POA: Diagnosis not present

## 2018-03-26 DIAGNOSIS — I25810 Atherosclerosis of coronary artery bypass graft(s) without angina pectoris: Secondary | ICD-10-CM | POA: Diagnosis not present

## 2018-03-26 DIAGNOSIS — I4891 Unspecified atrial fibrillation: Secondary | ICD-10-CM | POA: Diagnosis not present

## 2018-03-26 DIAGNOSIS — I679 Cerebrovascular disease, unspecified: Secondary | ICD-10-CM | POA: Diagnosis not present

## 2018-04-02 DIAGNOSIS — G309 Alzheimer's disease, unspecified: Secondary | ICD-10-CM | POA: Diagnosis not present

## 2018-04-02 DIAGNOSIS — I509 Heart failure, unspecified: Secondary | ICD-10-CM | POA: Diagnosis not present

## 2018-04-02 DIAGNOSIS — R634 Abnormal weight loss: Secondary | ICD-10-CM | POA: Diagnosis not present

## 2018-04-02 DIAGNOSIS — D649 Anemia, unspecified: Secondary | ICD-10-CM | POA: Diagnosis not present

## 2018-04-02 DIAGNOSIS — I4891 Unspecified atrial fibrillation: Secondary | ICD-10-CM | POA: Diagnosis not present

## 2018-04-02 DIAGNOSIS — N183 Chronic kidney disease, stage 3 (moderate): Secondary | ICD-10-CM | POA: Diagnosis not present

## 2018-04-02 DIAGNOSIS — I5020 Unspecified systolic (congestive) heart failure: Secondary | ICD-10-CM | POA: Diagnosis not present

## 2018-04-02 DIAGNOSIS — I679 Cerebrovascular disease, unspecified: Secondary | ICD-10-CM | POA: Diagnosis not present

## 2018-04-02 DIAGNOSIS — I25810 Atherosclerosis of coronary artery bypass graft(s) without angina pectoris: Secondary | ICD-10-CM | POA: Diagnosis not present

## 2018-04-02 LAB — BASIC METABOLIC PANEL
BUN: 20 (ref 4–21)
Creatinine: 1 (ref 0.6–1.3)
Glucose: 104
Potassium: 4.6 (ref 3.4–5.3)
Sodium: 141 (ref 137–147)

## 2018-04-07 DIAGNOSIS — M2450 Contracture, unspecified joint: Secondary | ICD-10-CM | POA: Diagnosis not present

## 2018-04-07 DIAGNOSIS — N401 Enlarged prostate with lower urinary tract symptoms: Secondary | ICD-10-CM | POA: Diagnosis not present

## 2018-04-07 DIAGNOSIS — G309 Alzheimer's disease, unspecified: Secondary | ICD-10-CM | POA: Diagnosis not present

## 2018-04-07 DIAGNOSIS — I679 Cerebrovascular disease, unspecified: Secondary | ICD-10-CM | POA: Diagnosis not present

## 2018-04-07 DIAGNOSIS — N183 Chronic kidney disease, stage 3 (moderate): Secondary | ICD-10-CM | POA: Diagnosis not present

## 2018-04-07 DIAGNOSIS — I4891 Unspecified atrial fibrillation: Secondary | ICD-10-CM | POA: Diagnosis not present

## 2018-04-07 DIAGNOSIS — R1310 Dysphagia, unspecified: Secondary | ICD-10-CM | POA: Diagnosis not present

## 2018-04-07 DIAGNOSIS — K219 Gastro-esophageal reflux disease without esophagitis: Secondary | ICD-10-CM | POA: Diagnosis not present

## 2018-04-07 DIAGNOSIS — E785 Hyperlipidemia, unspecified: Secondary | ICD-10-CM | POA: Diagnosis not present

## 2018-04-07 DIAGNOSIS — I509 Heart failure, unspecified: Secondary | ICD-10-CM | POA: Diagnosis not present

## 2018-04-07 DIAGNOSIS — I25810 Atherosclerosis of coronary artery bypass graft(s) without angina pectoris: Secondary | ICD-10-CM | POA: Diagnosis not present

## 2018-04-09 DIAGNOSIS — I4891 Unspecified atrial fibrillation: Secondary | ICD-10-CM | POA: Diagnosis not present

## 2018-04-09 DIAGNOSIS — I679 Cerebrovascular disease, unspecified: Secondary | ICD-10-CM | POA: Diagnosis not present

## 2018-04-09 DIAGNOSIS — G309 Alzheimer's disease, unspecified: Secondary | ICD-10-CM | POA: Diagnosis not present

## 2018-04-09 DIAGNOSIS — I25810 Atherosclerosis of coronary artery bypass graft(s) without angina pectoris: Secondary | ICD-10-CM | POA: Diagnosis not present

## 2018-04-09 DIAGNOSIS — I509 Heart failure, unspecified: Secondary | ICD-10-CM | POA: Diagnosis not present

## 2018-04-09 DIAGNOSIS — N183 Chronic kidney disease, stage 3 (moderate): Secondary | ICD-10-CM | POA: Diagnosis not present

## 2018-04-16 DIAGNOSIS — I4891 Unspecified atrial fibrillation: Secondary | ICD-10-CM | POA: Diagnosis not present

## 2018-04-16 DIAGNOSIS — I509 Heart failure, unspecified: Secondary | ICD-10-CM | POA: Diagnosis not present

## 2018-04-16 DIAGNOSIS — R634 Abnormal weight loss: Secondary | ICD-10-CM | POA: Diagnosis not present

## 2018-04-16 DIAGNOSIS — G309 Alzheimer's disease, unspecified: Secondary | ICD-10-CM | POA: Diagnosis not present

## 2018-04-16 DIAGNOSIS — D649 Anemia, unspecified: Secondary | ICD-10-CM | POA: Diagnosis not present

## 2018-04-16 DIAGNOSIS — I679 Cerebrovascular disease, unspecified: Secondary | ICD-10-CM | POA: Diagnosis not present

## 2018-04-16 DIAGNOSIS — I5020 Unspecified systolic (congestive) heart failure: Secondary | ICD-10-CM | POA: Diagnosis not present

## 2018-04-16 DIAGNOSIS — N183 Chronic kidney disease, stage 3 (moderate): Secondary | ICD-10-CM | POA: Diagnosis not present

## 2018-04-16 DIAGNOSIS — I25810 Atherosclerosis of coronary artery bypass graft(s) without angina pectoris: Secondary | ICD-10-CM | POA: Diagnosis not present

## 2018-04-16 LAB — BASIC METABOLIC PANEL
BUN: 20 (ref 4–21)
Creatinine: 0.9 (ref 0.6–1.3)
Glucose: 98
Potassium: 4.3 (ref 3.4–5.3)
Sodium: 143 (ref 137–147)

## 2018-04-23 DIAGNOSIS — I4891 Unspecified atrial fibrillation: Secondary | ICD-10-CM | POA: Diagnosis not present

## 2018-04-23 DIAGNOSIS — I25810 Atherosclerosis of coronary artery bypass graft(s) without angina pectoris: Secondary | ICD-10-CM | POA: Diagnosis not present

## 2018-04-23 DIAGNOSIS — N183 Chronic kidney disease, stage 3 (moderate): Secondary | ICD-10-CM | POA: Diagnosis not present

## 2018-04-23 DIAGNOSIS — I679 Cerebrovascular disease, unspecified: Secondary | ICD-10-CM | POA: Diagnosis not present

## 2018-04-23 DIAGNOSIS — I509 Heart failure, unspecified: Secondary | ICD-10-CM | POA: Diagnosis not present

## 2018-04-23 DIAGNOSIS — G309 Alzheimer's disease, unspecified: Secondary | ICD-10-CM | POA: Diagnosis not present

## 2018-04-25 DIAGNOSIS — I25810 Atherosclerosis of coronary artery bypass graft(s) without angina pectoris: Secondary | ICD-10-CM | POA: Diagnosis not present

## 2018-04-25 DIAGNOSIS — N183 Chronic kidney disease, stage 3 (moderate): Secondary | ICD-10-CM | POA: Diagnosis not present

## 2018-04-25 DIAGNOSIS — I679 Cerebrovascular disease, unspecified: Secondary | ICD-10-CM | POA: Diagnosis not present

## 2018-04-25 DIAGNOSIS — I509 Heart failure, unspecified: Secondary | ICD-10-CM | POA: Diagnosis not present

## 2018-04-25 DIAGNOSIS — I4891 Unspecified atrial fibrillation: Secondary | ICD-10-CM | POA: Diagnosis not present

## 2018-04-25 DIAGNOSIS — G309 Alzheimer's disease, unspecified: Secondary | ICD-10-CM | POA: Diagnosis not present

## 2018-04-28 DIAGNOSIS — G309 Alzheimer's disease, unspecified: Secondary | ICD-10-CM | POA: Diagnosis not present

## 2018-04-28 DIAGNOSIS — I25810 Atherosclerosis of coronary artery bypass graft(s) without angina pectoris: Secondary | ICD-10-CM | POA: Diagnosis not present

## 2018-04-28 DIAGNOSIS — N183 Chronic kidney disease, stage 3 (moderate): Secondary | ICD-10-CM | POA: Diagnosis not present

## 2018-04-28 DIAGNOSIS — I509 Heart failure, unspecified: Secondary | ICD-10-CM | POA: Diagnosis not present

## 2018-04-28 DIAGNOSIS — I4891 Unspecified atrial fibrillation: Secondary | ICD-10-CM | POA: Diagnosis not present

## 2018-04-28 DIAGNOSIS — I679 Cerebrovascular disease, unspecified: Secondary | ICD-10-CM | POA: Diagnosis not present

## 2018-04-29 DIAGNOSIS — I509 Heart failure, unspecified: Secondary | ICD-10-CM | POA: Diagnosis not present

## 2018-04-29 DIAGNOSIS — I25810 Atherosclerosis of coronary artery bypass graft(s) without angina pectoris: Secondary | ICD-10-CM | POA: Diagnosis not present

## 2018-04-29 DIAGNOSIS — I679 Cerebrovascular disease, unspecified: Secondary | ICD-10-CM | POA: Diagnosis not present

## 2018-04-29 DIAGNOSIS — I4891 Unspecified atrial fibrillation: Secondary | ICD-10-CM | POA: Diagnosis not present

## 2018-04-29 DIAGNOSIS — N183 Chronic kidney disease, stage 3 (moderate): Secondary | ICD-10-CM | POA: Diagnosis not present

## 2018-04-29 DIAGNOSIS — G309 Alzheimer's disease, unspecified: Secondary | ICD-10-CM | POA: Diagnosis not present

## 2018-05-01 DIAGNOSIS — N183 Chronic kidney disease, stage 3 (moderate): Secondary | ICD-10-CM | POA: Diagnosis not present

## 2018-05-01 DIAGNOSIS — D649 Anemia, unspecified: Secondary | ICD-10-CM | POA: Diagnosis not present

## 2018-05-01 LAB — BASIC METABOLIC PANEL
BUN: 19 (ref 4–21)
Creatinine: 0.9 (ref 0.6–1.3)
Glucose: 92
Potassium: 4.4 (ref 3.4–5.3)
Sodium: 141 (ref 137–147)

## 2018-05-05 ENCOUNTER — Non-Acute Institutional Stay: Payer: Medicare Other | Admitting: Internal Medicine

## 2018-05-05 ENCOUNTER — Encounter: Payer: Self-pay | Admitting: Internal Medicine

## 2018-05-05 DIAGNOSIS — G301 Alzheimer's disease with late onset: Secondary | ICD-10-CM

## 2018-05-05 DIAGNOSIS — I4891 Unspecified atrial fibrillation: Secondary | ICD-10-CM | POA: Diagnosis not present

## 2018-05-05 DIAGNOSIS — D6869 Other thrombophilia: Secondary | ICD-10-CM | POA: Diagnosis not present

## 2018-05-05 DIAGNOSIS — I5032 Chronic diastolic (congestive) heart failure: Secondary | ICD-10-CM | POA: Diagnosis not present

## 2018-05-05 DIAGNOSIS — F02818 Dementia in other diseases classified elsewhere, unspecified severity, with other behavioral disturbance: Secondary | ICD-10-CM

## 2018-05-05 DIAGNOSIS — I872 Venous insufficiency (chronic) (peripheral): Secondary | ICD-10-CM | POA: Diagnosis not present

## 2018-05-05 DIAGNOSIS — R1310 Dysphagia, unspecified: Secondary | ICD-10-CM | POA: Diagnosis not present

## 2018-05-05 DIAGNOSIS — N184 Chronic kidney disease, stage 4 (severe): Secondary | ICD-10-CM | POA: Diagnosis not present

## 2018-05-05 DIAGNOSIS — F0281 Dementia in other diseases classified elsewhere with behavioral disturbance: Secondary | ICD-10-CM | POA: Diagnosis not present

## 2018-05-05 NOTE — Progress Notes (Signed)
Patient ID: Bryan [name], male   DOB: [birth year], 84 y.o.   MRN: [id number]  Location:  [place] Room Number: [place] of Service:  ALF [phone]) Provider:   [name], DO  Patient Care Team: [name], DO as PCP - General (Geriatric Medicine) [facility] Edgar Frisk, NP as Nurse Practitioner (Nurse Practitioner)  Extended Emergency Contact Information Primary Emergency Contact: [name] Address: [address]          [place], [place] [address] [place] Phone: [phone] Relation: Spouse Secondary Emergency Contact: [name], [place] Phone: [phone] Relation: Daughter  Code Status:  DNR Goals of care: Advanced Directive information Advanced Directives 05/05/2018  Does Patient Have a Medical Advance Directive? Yes  Type of Paramedic of [place];Living will;Out of facility DNR (pink MOST or yellow form)  Does patient want to make changes to medical advance directive? No - Patient declined  Copy of [place] in Chart? Yes  Pre-existing out of facility DNR order (yellow form or pink MOST form) Yellow form placed in chart (order not valid for inpatient use);Pink MOST form placed in chart (order not valid for inpatient use)   Chief Complaint  Patient presents with  . Medical Management of Chronic Issues    Routine Visit    HPI:  Pt is a 84 y.o. male with end stage dementia fast 7, afib on eliquis, CKDIV seen today for medical management of chronic diseases.  He's had some conjunctivitis with erythema and clear drainage, has been seen itching and rubbing his eyes with his blanket.   He is fully dependent in ADLs long term and requires a lift for transfers.  No recent changes in edema or breathing. Has not had behavioral concerns recently.    Past Medical History:   Diagnosis Date  . Allergic rhinitis   . Alzheimer's disease ([place]) 2013  . Atrial fibrillation ([place])    Long term anti coagulation w/ warfarin fro stroke risk reduction  . BPH (benign prostatic hyperplasia)   . Chronic venous insufficiency 01/12/2013  . Dementia with behavioral disturbance ([place]) 12/29/13  . Gait disorder   . GERD (gastroesophageal reflux disease)   . History of CVA (cerebrovascular accident) 2001   Rt.MCA  . Hyperlipidemia   . Lactose intolerance   . Long term (current) use of anticoagulants 11/23/2012   Long-term anticoagulation with warfarin for stroke risk reduction related to AF   Past Surgical History:  Procedure Laterality Date  . CATARACT EXTRACTION W/ INTRAOCULAR LENS  IMPLANT, BILATERAL    . HERNIA REPAIR Right 1970s  . KNEE SURGERY Right 2000  . MASS EXCISION Left 07/30/2013   Procedure: EXCISION OF BASAL CELL CARCINOMA  LEFT FOREHEAD ;  Surgeon: Irene Limbo, MD;  Location: [facility];  Service: Plastics;  Laterality: Left;  . RETINAL DETACHMENT SURGERY Right 2009    Allergies  Allergen Reactions  . Penicillins Rash  . Bee Venom   . Risperidone And Related     Unstable gait    Outpatient Encounter Medications as of 05/05/2018  Medication Sig  . apixaban (ELIQUIS) 2.5 MG TABS tablet Take 2.5 mg by mouth 2 (two) times daily.  . Hypromellose (ARTIFICIAL TEARS OP) Apply 2 drops to eye 3 (three) times daily.  Marland Kitchen ipratropium-albuterol (DUONEB) 0.5-2.5 (3) MG/3ML SOLN Take 3 mLs  by nebulization every 6 (six) hours as needed (for wheezing and cough). q 6 hrs prn  . lactose free nutrition (BOOST) LIQD Take 237 mLs by mouth daily.   . nitroGLYCERIN (NITROSTAT) 0.4 MG SL tablet Place 0.4 mg under the tongue every 5 (five) minutes as needed for chest pain.  . potassium chloride SA (K-DUR,KLOR-CON) 20 MEQ tablet Take 40 mEq by mouth daily.  . [DISCONTINUED] polyethylene glycol (MIRALAX / GLYCOLAX) packet Take 17 g by mouth daily as needed.    No facility-administered encounter medications on file as of 05/05/2018.     Review of Systems  Constitutional: Negative for activity change, appetite change, chills, fever and unexpected weight change.  HENT: Positive for trouble swallowing.   Eyes: Negative for visual disturbance.  Respiratory: Negative for chest tightness and shortness of breath.   Cardiovascular: Positive for leg swelling. Negative for chest pain.       No changes in edema  Gastrointestinal: Negative for abdominal pain, blood in stool and constipation.  Genitourinary: Negative for dysuria.  Musculoskeletal: Positive for gait problem.  Skin: Negative for color change.  Neurological: Negative for dizziness.  Hematological: Bruises/bleeds easily.  Psychiatric/Behavioral: Positive for confusion. Negative for agitation and behavioral problems.    Immunization History  Administered Date(s) Administered  . Influenza Inj Mdck Quad Pf 04/25/2016  . Influenza Whole 04/20/2013  . Influenza,inj,Quad PF,6+ Mos 04/28/2018  . Influenza-Unspecified 04/12/2014, 04/20/2015, 05/01/2017  . PPD Test 11/29/2012  . Pneumococcal Conjugate-13 08/01/2016  . Pneumococcal Polysaccharide-23 10/07/2017  . Zoster Recombinat (Shingrix) 05/20/2017, 09/30/2017   Pertinent  Health Maintenance Due  Topic Date Due  . INFLUENZA VACCINE  Completed  . PNA vac Low Risk Adult  Completed   Fall Risk  05/28/2017 06/21/2016  Falls in the past year? No No    Vitals:   05/05/18 1327  BP: (!) 116/58  Pulse: 76  Resp: 18  Temp: (!) 96.8 F (36 C)  TempSrc: Oral  SpO2: 99%  Weight: 169 lb (76.7 kg)  Height: 6' (1.829 m)   Body mass index is 22.92 kg/m. Physical Exam  Constitutional: He appears well-developed. No distress.  HENT:  Head: Normocephalic and atraumatic.  Eyes: Pupils are equal, round, and reactive to light. EOM are normal.  Erythema and clear drainage from conjunctiva  Neck: Neck supple.  Cardiovascular:  irreg irreg;  chronic nonpitting edema  Pulmonary/Chest: Effort normal and breath sounds normal. He has no rales.  Abdominal: Bowel sounds are normal.  Musculoskeletal:  Contractures of lower extremities  Neurological: He is alert.    Labs reviewed: Recent Labs    04/02/18 0700 04/16/18 0700 05/01/18 1119  NA 141 143 141  K 4.6 4.3 4.4  BUN 20 20 19   CREATININE 1.0 0.9 0.9   No results for input(s): AST, ALT, ALKPHOS, BILITOT, PROT, ALBUMIN in the last 8760 hours. Recent Labs    06/23/17 09/11/17  WBC 16.2 7.2  HGB 13.9 14.1  HCT 44 46  PLT 240 212   Lab Results  Component Value Date   TSH 4.17 11/13/2017   No results found for: HGBA1C Lab Results  Component Value Date   CHOL 155 08/31/2015   HDL 33 (A) 08/31/2015   LDLCALC 99 08/31/2015   TRIG 116 08/31/2015    Assessment/Plan 1. Hypercoagulable state due to atrial fibrillation ([place]) -continues on eliquis for stroke prevention with afib  2. Chronic kidney disease (CKD), stage IV (severe) (HCC) -Avoid nephrotoxic agents like nsaids, dose adjust renally excreted meds, hydrate.  3. Late onset Alzheimer's disease with behavioral disturbance ([place]) -end stage for a long time, cont supportive care  4. Chronic diastolic congestive heart failure (HCC) -stable recently without major weight changes or shortness of breath, stable edema  5. Chronic venous insufficiency -stable swelling, no changes needed  6. Dysphagia, unspecified type -cont modified diet and assistance with meals and beverages, asporation precautions  Family/ staff Communication: discussed with memory care nurse  Labs/tests ordered:  No new, gets regular bmps at family request  Hyamp[place] L. Issaiah Seabrooks, D.O. [facility] [address], [place] [address] Cell Phone (Mon-Fri 8am-5pm):  [phone] On Call:  [phone] & follow prompts after 5pm & weekends Office Phone:  [phone] Office Fax:   [phone]

## 2018-05-07 DIAGNOSIS — N183 Chronic kidney disease, stage 3 (moderate): Secondary | ICD-10-CM | POA: Diagnosis not present

## 2018-05-07 DIAGNOSIS — I25810 Atherosclerosis of coronary artery bypass graft(s) without angina pectoris: Secondary | ICD-10-CM | POA: Diagnosis not present

## 2018-05-07 DIAGNOSIS — I679 Cerebrovascular disease, unspecified: Secondary | ICD-10-CM | POA: Diagnosis not present

## 2018-05-07 DIAGNOSIS — I4891 Unspecified atrial fibrillation: Secondary | ICD-10-CM | POA: Diagnosis not present

## 2018-05-07 DIAGNOSIS — G309 Alzheimer's disease, unspecified: Secondary | ICD-10-CM | POA: Diagnosis not present

## 2018-05-07 DIAGNOSIS — I509 Heart failure, unspecified: Secondary | ICD-10-CM | POA: Diagnosis not present

## 2018-05-07 DIAGNOSIS — Z23 Encounter for immunization: Secondary | ICD-10-CM | POA: Diagnosis not present

## 2018-05-08 DIAGNOSIS — G309 Alzheimer's disease, unspecified: Secondary | ICD-10-CM | POA: Diagnosis not present

## 2018-05-08 DIAGNOSIS — M2450 Contracture, unspecified joint: Secondary | ICD-10-CM | POA: Diagnosis not present

## 2018-05-08 DIAGNOSIS — I25810 Atherosclerosis of coronary artery bypass graft(s) without angina pectoris: Secondary | ICD-10-CM | POA: Diagnosis not present

## 2018-05-08 DIAGNOSIS — N401 Enlarged prostate with lower urinary tract symptoms: Secondary | ICD-10-CM | POA: Diagnosis not present

## 2018-05-08 DIAGNOSIS — I509 Heart failure, unspecified: Secondary | ICD-10-CM | POA: Diagnosis not present

## 2018-05-08 DIAGNOSIS — I4891 Unspecified atrial fibrillation: Secondary | ICD-10-CM | POA: Diagnosis not present

## 2018-05-08 DIAGNOSIS — N183 Chronic kidney disease, stage 3 (moderate): Secondary | ICD-10-CM | POA: Diagnosis not present

## 2018-05-08 DIAGNOSIS — K219 Gastro-esophageal reflux disease without esophagitis: Secondary | ICD-10-CM | POA: Diagnosis not present

## 2018-05-08 DIAGNOSIS — I679 Cerebrovascular disease, unspecified: Secondary | ICD-10-CM | POA: Diagnosis not present

## 2018-05-08 DIAGNOSIS — R1310 Dysphagia, unspecified: Secondary | ICD-10-CM | POA: Diagnosis not present

## 2018-05-08 DIAGNOSIS — E785 Hyperlipidemia, unspecified: Secondary | ICD-10-CM | POA: Diagnosis not present

## 2018-05-11 DIAGNOSIS — I4891 Unspecified atrial fibrillation: Secondary | ICD-10-CM | POA: Insufficient documentation

## 2018-05-11 DIAGNOSIS — D6869 Other thrombophilia: Secondary | ICD-10-CM | POA: Insufficient documentation

## 2018-05-11 DIAGNOSIS — N184 Chronic kidney disease, stage 4 (severe): Secondary | ICD-10-CM | POA: Insufficient documentation

## 2018-05-12 DIAGNOSIS — N183 Chronic kidney disease, stage 3 (moderate): Secondary | ICD-10-CM | POA: Diagnosis not present

## 2018-05-12 DIAGNOSIS — I679 Cerebrovascular disease, unspecified: Secondary | ICD-10-CM | POA: Diagnosis not present

## 2018-05-12 DIAGNOSIS — G309 Alzheimer's disease, unspecified: Secondary | ICD-10-CM | POA: Diagnosis not present

## 2018-05-12 DIAGNOSIS — I4891 Unspecified atrial fibrillation: Secondary | ICD-10-CM | POA: Diagnosis not present

## 2018-05-12 DIAGNOSIS — I509 Heart failure, unspecified: Secondary | ICD-10-CM | POA: Diagnosis not present

## 2018-05-12 DIAGNOSIS — I25810 Atherosclerosis of coronary artery bypass graft(s) without angina pectoris: Secondary | ICD-10-CM | POA: Diagnosis not present

## 2018-05-14 DIAGNOSIS — D649 Anemia, unspecified: Secondary | ICD-10-CM | POA: Diagnosis not present

## 2018-05-14 DIAGNOSIS — N183 Chronic kidney disease, stage 3 (moderate): Secondary | ICD-10-CM | POA: Diagnosis not present

## 2018-05-20 DIAGNOSIS — G309 Alzheimer's disease, unspecified: Secondary | ICD-10-CM | POA: Diagnosis not present

## 2018-05-20 DIAGNOSIS — I4891 Unspecified atrial fibrillation: Secondary | ICD-10-CM | POA: Diagnosis not present

## 2018-05-20 DIAGNOSIS — I679 Cerebrovascular disease, unspecified: Secondary | ICD-10-CM | POA: Diagnosis not present

## 2018-05-20 DIAGNOSIS — I509 Heart failure, unspecified: Secondary | ICD-10-CM | POA: Diagnosis not present

## 2018-05-20 DIAGNOSIS — I25810 Atherosclerosis of coronary artery bypass graft(s) without angina pectoris: Secondary | ICD-10-CM | POA: Diagnosis not present

## 2018-05-20 DIAGNOSIS — N183 Chronic kidney disease, stage 3 (moderate): Secondary | ICD-10-CM | POA: Diagnosis not present

## 2018-05-21 DIAGNOSIS — I679 Cerebrovascular disease, unspecified: Secondary | ICD-10-CM | POA: Diagnosis not present

## 2018-05-21 DIAGNOSIS — N183 Chronic kidney disease, stage 3 (moderate): Secondary | ICD-10-CM | POA: Diagnosis not present

## 2018-05-21 DIAGNOSIS — G309 Alzheimer's disease, unspecified: Secondary | ICD-10-CM | POA: Diagnosis not present

## 2018-05-21 DIAGNOSIS — I25810 Atherosclerosis of coronary artery bypass graft(s) without angina pectoris: Secondary | ICD-10-CM | POA: Diagnosis not present

## 2018-05-21 DIAGNOSIS — I4891 Unspecified atrial fibrillation: Secondary | ICD-10-CM | POA: Diagnosis not present

## 2018-05-21 DIAGNOSIS — I509 Heart failure, unspecified: Secondary | ICD-10-CM | POA: Diagnosis not present

## 2018-05-26 DIAGNOSIS — I509 Heart failure, unspecified: Secondary | ICD-10-CM | POA: Diagnosis not present

## 2018-05-26 DIAGNOSIS — I4891 Unspecified atrial fibrillation: Secondary | ICD-10-CM | POA: Diagnosis not present

## 2018-05-26 DIAGNOSIS — N183 Chronic kidney disease, stage 3 (moderate): Secondary | ICD-10-CM | POA: Diagnosis not present

## 2018-05-26 DIAGNOSIS — I679 Cerebrovascular disease, unspecified: Secondary | ICD-10-CM | POA: Diagnosis not present

## 2018-05-26 DIAGNOSIS — I25810 Atherosclerosis of coronary artery bypass graft(s) without angina pectoris: Secondary | ICD-10-CM | POA: Diagnosis not present

## 2018-05-26 DIAGNOSIS — G309 Alzheimer's disease, unspecified: Secondary | ICD-10-CM | POA: Diagnosis not present

## 2018-05-28 DIAGNOSIS — D649 Anemia, unspecified: Secondary | ICD-10-CM | POA: Diagnosis not present

## 2018-05-28 DIAGNOSIS — N183 Chronic kidney disease, stage 3 (moderate): Secondary | ICD-10-CM | POA: Diagnosis not present

## 2018-06-03 ENCOUNTER — Non-Acute Institutional Stay: Payer: Medicare Other

## 2018-06-03 ENCOUNTER — Other Ambulatory Visit: Payer: Self-pay | Admitting: Internal Medicine

## 2018-06-03 DIAGNOSIS — Z0000 Encounter for general adult medical examination without abnormal findings: Secondary | ICD-10-CM

## 2018-06-03 DIAGNOSIS — Z7189 Other specified counseling: Secondary | ICD-10-CM

## 2018-06-03 NOTE — Progress Notes (Signed)
Subjective:   Bryan [name] is a 84 y.o. male who presents for Medicare Annual/Subsequent preventive examination at [facility] memory care; hospice patient; incapacitated patient unable to answer questions appropriately. History, medication list, and ADL's verified by medical record/family. Completed visit with caregiver  Last AWV-05/28/2017    Objective:    Vitals: BP 120/66 (BP Location: Right Arm, Patient Position: Sitting)   Pulse 75   Temp (!) 97.5 F (36.4 C) (Oral)   Ht 6' (1.829 m)   Wt 169 lb (76.7 kg)   BMI 22.92 kg/m   Body mass index is 22.92 kg/m.  Advanced Directives 06/03/2018 05/05/2018 11/10/2017 11/10/2017 09/16/2017 06/23/2017 05/28/2017  Does Patient Have a Medical Advance Directive? Yes Yes Yes Yes Yes Yes Yes  Type of Paramedic of [place];Living will;Out of facility DNR (pink MOST or yellow form) [place];Living will;Out of facility DNR (pink MOST or yellow form) [place];Living will;Out of facility DNR (pink MOST or yellow form) [place];Living will;Out of facility DNR (pink MOST or yellow form) [place];Living will;Out of facility DNR (pink MOST or yellow form) [place];Living will;Out of facility DNR (pink MOST or yellow form) [place];Living will;Out of facility DNR (pink MOST or yellow form)  Does patient want to make changes to medical advance directive? No - Patient declined No - Patient declined No - Patient declined No - Patient declined No - Patient declined - No - Patient declined  Copy of [place] in Chart? Yes - validated most recent copy scanned in chart (See row information) Yes Yes Yes Yes Yes Yes  Pre-existing out of facility DNR order (yellow form or pink MOST form) Yellow form placed in chart (order not valid for inpatient use);Pink MOST form placed in chart (order not valid for inpatient  use) Yellow form placed in chart (order not valid for inpatient use);Pink MOST form placed in chart (order not valid for inpatient use) Yellow form placed in chart (order not valid for inpatient use);Pink MOST form placed in chart (order not valid for inpatient use) Yellow form placed in chart (order not valid for inpatient use);Pink MOST form placed in chart (order not valid for inpatient use) Yellow form placed in chart (order not valid for inpatient use);Pink MOST form placed in chart (order not valid for inpatient use) Pink MOST form placed in chart (order not valid for inpatient use);Yellow form placed in chart (order not valid for inpatient use) Pink MOST form placed in chart (order not valid for inpatient use);Yellow form placed in chart (order not valid for inpatient use)    Tobacco Social History   Tobacco Use  Smoking Status Former Smoker  . Last attempt to quit: 07/24/1983  . Years since quitting: 34.8  Smokeless Tobacco Former Systems developer  . Quit date: 11/18/1970     Counseling given: Not Answered   Clinical Intake:  Pre-visit preparation completed: No  Pain : 0-10 Pain Score: 0-No pain     Diabetes: No  How often do you need to have someone help you when you read instructions, pamphlets, or other written materials from your doctor or pharmacy?: 5 - Always  Interpreter Needed?: No  Information entered by :: Tyson Dense, RN  Past Medical History:  Diagnosis Date  . Allergic rhinitis   . Alzheimer's disease ([place]) 2013  . Atrial fibrillation ([place])    Long term anti coagulation w/ warfarin fro stroke risk reduction  .  BPH (benign prostatic hyperplasia)   . Chronic venous insufficiency 01/12/2013  . Dementia with behavioral disturbance ([place]) 12/29/13  . Gait disorder   . GERD (gastroesophageal reflux disease)   . History of CVA (cerebrovascular accident) 2001   Rt.MCA  . Hyperlipidemia   . Lactose intolerance   . Long term (current) use of anticoagulants 11/23/2012    Long-term anticoagulation with warfarin for stroke risk reduction related to AF   Past Surgical History:  Procedure Laterality Date  . CATARACT EXTRACTION W/ INTRAOCULAR LENS  IMPLANT, BILATERAL    . HERNIA REPAIR Right 1970s  . KNEE SURGERY Right 2000  . MASS EXCISION Left 07/30/2013   Procedure: EXCISION OF BASAL CELL CARCINOMA  LEFT FOREHEAD ;  Surgeon: Irene Limbo, MD;  Location: [facility] [place];  Service: Plastics;  Laterality: Left;  . RETINAL DETACHMENT SURGERY Right 2009   History reviewed. No pertinent family history. Social History   Socioeconomic History  . Marital status: Married    Spouse name: Izora Gala  . Number of children: Not on file  . Years of education: Not on file  . Highest education level: Not on file  Occupational History  . Not on file  Social Needs  . Financial resource strain: Not on file  . Food insecurity:    Worry: Not on file    Inability: Not on file  . Transportation needs:    Medical: Not on file    Non-medical: Not on file  Tobacco Use  . Smoking status: Former Smoker    Last attempt to quit: 07/24/1983    Years since quitting: 34.8  . Smokeless tobacco: Former Systems developer    Quit date: 11/18/1970  Substance and Sexual Activity  . Alcohol use: Not Currently    Frequency: Never    Comment: occasionally  . Drug use: No  . Sexual activity: Never  Lifestyle  . Physical activity:    Days per week: 0 days    Minutes per session: 0 min  . Stress: Not on file  Relationships  . Social connections:    Talks on phone: Not on file    Gets together: More than three times a week    Attends religious service: Not on file    Active member of club or organization: Not on file    Attends meetings of clubs or organizations: Not on file    Relationship status: Married  Other Topics Concern  . Not on file  Social History Narrative   Married. 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. Patient and spouse have resided at [facility]  since June 2010. Patient recently moved to the Memory Care section in same community. He quit smoking 1972, drinks an occasional alcoholic drink.   Has a living will and healthcare power of attorney.    Outpatient Encounter Medications as of 06/03/2018  Medication Sig  . apixaban (ELIQUIS) 2.5 MG TABS tablet Take 2.5 mg by mouth 2 (two) times daily.  . Hypromellose (ARTIFICIAL TEARS OP) Apply 2 drops to eye 3 (three) times daily.  Marland Kitchen ipratropium-albuterol (DUONEB) 0.5-2.5 (3) MG/3ML SOLN Take 3 mLs by nebulization every 6 (six) hours as needed (for wheezing and cough). q 6 hrs prn  . lactose free nutrition (BOOST) LIQD Take 237 mLs by mouth daily.   . nitroGLYCERIN (NITROSTAT) 0.4 MG SL tablet Place 0.4 mg under the tongue every 5 (five) minutes as needed for chest pain.  . potassium chloride SA (K-DUR,KLOR-CON) 20 MEQ tablet Take 40 mEq by mouth  daily.   No facility-administered encounter medications on file as of 06/03/2018.     Activities of Daily Living In your present state of health, do you have any difficulty performing the following activities: 06/03/2018  Hearing? N  Vision? N  Difficulty concentrating or making decisions? Y  Walking or climbing stairs? Y  Dressing or bathing? Y  Doing errands, shopping? Y  Preparing Food and eating ? Y  Using the Toilet? Y  In the past six months, have you accidently leaked urine? Y  Do you have problems with loss of bowel control? Y  Managing your Medications? Y  Managing your Finances? Y  Housekeeping or managing your Housekeeping? Y  Some recent data might be hidden    Patient Care Team: [name], DO as PCP - General (Geriatric Medicine) Community[facility] Angelique Holm, Margreta Journey, NP as Nurse Practitioner (Nurse Practitioner)   Assessment:   This is a routine wellness examination for Bryan [name].  Exercise Activities and Dietary recommendations Current Exercise Habits: The patient does not participate in regular  exercise at present, Exercise limited by: orthopedic condition(s)  Goals   None     Fall Risk Fall Risk  06/03/2018 05/28/2017 06/21/2016  Falls in the past year? 0 No No  Number falls in past yr: 0 - -  Injury with Fall? 0 - -   Is the patient's home free of loose throw rugs in walkways, pet beds, electrical cords, etc?   yes      Grab bars in the bathroom? yes      Handrails on the stairs?   yes      Adequate lighting?   yes   Depression Screen PHQ 2/9 Scores 06/03/2018 05/28/2017 06/21/2016  Exception Documentation Medical reason Medical reason Patient refusal    Cognitive Function MMSE - Mini Mental State Exam 06/03/2018 05/28/2017 11/16/2012  Not completed: Unable to complete Unable to complete -  Orientation to time - - 0  Orientation to Place - - 0  Registration - - 0  Attention/ Calculation - - 0  Recall - - 0  Language- name 2 objects - - 0  Language- repeat - - 1  Language- follow 3 step command - - 3  Language- read & follow direction - - 1  Write a sentence - - 0  Copy design - - 0  Total score - - 5        Immunization History  Administered Date(s) Administered  . Influenza Inj Mdck Quad Pf 04/25/2016  . Influenza Whole 04/20/2013  . Influenza,inj,Quad PF,6+ Mos 04/28/2018  . Influenza-Unspecified 04/12/2014, 04/20/2015, 05/01/2017  . PPD Test 11/29/2012  . Pneumococcal Conjugate-13 08/01/2016  . Pneumococcal Polysaccharide-23 10/07/2017  . Zoster Recombinat (Shingrix) 05/20/2017, 09/30/2017    Qualifies for Shingles Vaccine? Up to date, completed  Screening Tests Health Maintenance  Topic Date Due  . TETANUS/TDAP  08/04/2018 (Originally 06/08/1942)  . INFLUENZA VACCINE  Completed  . PNA vac Low Risk Adult  Completed   Cancer Screenings: Lung: Low Dose CT Chest recommended if Age 63-80 years, 30 pack-year currently smoking OR have quit w/in 15years. Patient does not qualify. Colorectal: up to date  Additional Screenings:  Hepatitis C  Screening: unable to appropriately accept or decline      Plan:    I have personally reviewed and addressed the Medicare Annual Wellness questionnaire and have noted the following in the patient's chart:  A. Medical and social history B. Use of alcohol, tobacco or illicit drugs  C. Current medications and supplements D. Functional ability and status E.  Nutritional status F.  Physical activity G. Advance directives H. List of other physicians I.  Hospitalizations, surgeries, and ER visits in previous 12 months J.  Calcium to include hearing, vision, cognitive, depression L. Referrals and appointments - none  In addition, I am unable to review and discuss with incapacitated patient certain preventive protocols, quality metrics, and best practice recommendations. A written personalized care plan for preventive services as well as general preventive health recommendations were provided to facility of residence.   See attached scanned questionnaire for additional information.   Signed,   Tyson Dense, RN Nurse Health Advisor

## 2018-06-03 NOTE — Patient Instructions (Signed)
[name] , I have completed the annual wellness visit as per Medicare guidelines. The attached information is provided for the patient's family, care providers, and facility of residence.  Screening recommendations/referrals: Colonoscopy excluded, over age 82 Recommended yearly ophthalmology/optometry visit for glaucoma screening and checkup Recommended yearly dental visit for hygiene and checkup  Vaccinations: Influenza vaccine up to date Pneumococcal vaccine up to date, completed Tdap vaccine due, not ordered patient is hospice Shingles vaccine up to date, completed    Advanced directives: in chart  Conditions/risks identified: Fall Risk  Next appointment: Dr. Mariea Clonts makes rounds  Preventive Care 64 Years and Older, Male Preventive care refers to lifestyle choices and visits with your health care provider that can promote health and wellness. What does preventive care include?  A yearly physical exam. This is also called an annual well check.  Dental exams once or twice a year.  Routine eye exams. Ask your health care provider how often you should have your eyes checked.  Personal lifestyle choices, including:  Daily care of your teeth and gums.  Regular physical activity.  Eating a healthy diet.  Avoiding tobacco and drug use.  Limiting alcohol use.  Taking vitamin and mineral supplements as recommended by your health care provider. What happens during an annual well check? The services and screenings done by your health care provider during your annual well check will depend on your age, overall health, lifestyle risk factors, and family history of disease. Counseling  Your health care provider may ask you questions about your:  Alcohol use.  Tobacco use.  Drug use.  Emotional well-being.  Home and relationship well-being.  Sexual activity.  Eating habits.  History of falls.  Memory and ability to understand (cognition).  Work and work  Statistician. Screening  You may have the following tests or measurements:  Height, weight, and BMI.  Blood pressure.  Lipid and cholesterol levels. These may be checked every 5 years, or more frequently if you are over 95 years old.  Skin check.  Lung cancer screening. You may have this screening every year starting at age 61 if you have a 30-pack-year history of smoking and currently smoke or have quit within the past 15 years.  Fecal occult blood test (FOBT) of the stool. You may have this test every year starting at age 16.  Flexible sigmoidoscopy or colonoscopy. You may have a sigmoidoscopy every 5 years or a colonoscopy every 10 years starting at age 34.  Prostate cancer screening. Recommendations will vary depending on your family history and other risks.  Hepatitis C blood test.  Hepatitis B blood test.  Diabetes screening. This is done by checking your blood sugar (glucose) after you have not eaten for a while (fasting). You may have this done every 1-3 years.  Abdominal aortic aneurysm (AAA) screening. You may need this if you are a current or former smoker.  Osteoporosis. You may be screened starting at age 51 if you are at high risk. Talk with your health care provider about your test results, treatment options, and if necessary, the need for more tests. Vaccines  Your health care provider may recommend certain vaccines, such as:  Influenza vaccine. This is recommended every year.  Tetanus, diphtheria, and acellular pertussis (Tdap, Td) vaccine. You may need a Td booster every 10 years.  Zoster vaccine. You may need this after age 40.  Pneumococcal 13-valent conjugate (PCV13) vaccine. One dose is recommended after age 82.  Pneumococcal polysaccharide (PPSV23) vaccine. One dose  is recommended after age 57. Talk to your health care provider about which screenings and vaccines you need and how often you need them. This information is not intended to replace advice  given to you by your health care provider. Make sure you discuss any questions you have with your health care provider. Document Released: 07/21/2015 Document Revised: 03/13/2016 Document Reviewed: 04/25/2015 Elsevier Interactive Patient Education  2017 [place] can cause injuries. They can happen to people of all ages. There are many things you can do to make your home safe and to help prevent falls.  What can I do in the bathroom?  Use night lights.  Install grab bars by the toilet and in the tub and shower. Do not use towel bars as grab bars.  Use non-skid mats or decals in the tub or shower.  If you need to sit down in the shower, use a plastic, non-slip stool.  Keep the floor dry. Clean up any water that spills on the floor as soon as it happens.  Remove soap buildup in the tub or shower regularly.  Attach bath mats securely with double-sided non-slip rug tape.  Do not have throw rugs and other things on the floor that can make you trip. What can I do in the bedroom?  Use night lights.  Make sure that you have a light by your bed that is easy to reach.  Do not use any sheets or blankets that are too big for your bed. They should not hang down onto the floor.  Have a firm chair that has side arms. You can use this for support while you get dressed.  Do not have throw rugs and other things on the floor that can make you trip. What else can I do to help prevent falls?  Wear shoes that:  Do not have high heels.  Have rubber bottoms.  Are comfortable and fit you well.  Are closed at the toe. Do not wear sandals.  If you use a stepladder:  Make sure that it is fully opened. Do not climb a closed stepladder.  Make sure that both sides of the stepladder are locked into place.  Ask someone to hold it for you, if possible.  Clearly mark and make sure that you can see:  Any grab bars or handrails.  First and last steps.  Where the  edge of each step is.  Use tools that help you move around (mobility aids) if they are needed. These include:  Canes.  Walkers.  Scooters.  Crutches.  Turn on the lights when you go into a dark area. Replace any light bulbs as soon as they burn out.  Set up your furniture so you have a clear path. Avoid moving your furniture around.  If any of your floors are uneven, fix them.  Review your medicines with your doctor. Some medicines can make you feel dizzy. This can increase your chance of falling. Ask your doctor what other things that you can do to help prevent falls. This information is not intended to replace advice given to you by your health care provider. Make sure you discuss any questions you have with your health care provider. Document Released: 04/20/2009 Document Revised: 11/30/2015 Document Reviewed: 07/29/2014 Elsevier Interactive Patient Education  2017 [employer].

## 2018-06-05 DIAGNOSIS — I509 Heart failure, unspecified: Secondary | ICD-10-CM | POA: Diagnosis not present

## 2018-06-05 DIAGNOSIS — G309 Alzheimer's disease, unspecified: Secondary | ICD-10-CM | POA: Diagnosis not present

## 2018-06-05 DIAGNOSIS — I679 Cerebrovascular disease, unspecified: Secondary | ICD-10-CM | POA: Diagnosis not present

## 2018-06-05 DIAGNOSIS — I25810 Atherosclerosis of coronary artery bypass graft(s) without angina pectoris: Secondary | ICD-10-CM | POA: Diagnosis not present

## 2018-06-05 DIAGNOSIS — N183 Chronic kidney disease, stage 3 (moderate): Secondary | ICD-10-CM | POA: Diagnosis not present

## 2018-06-05 DIAGNOSIS — I4891 Unspecified atrial fibrillation: Secondary | ICD-10-CM | POA: Diagnosis not present

## 2018-06-07 DIAGNOSIS — N183 Chronic kidney disease, stage 3 (moderate): Secondary | ICD-10-CM | POA: Diagnosis not present

## 2018-06-07 DIAGNOSIS — I509 Heart failure, unspecified: Secondary | ICD-10-CM | POA: Diagnosis not present

## 2018-06-07 DIAGNOSIS — G309 Alzheimer's disease, unspecified: Secondary | ICD-10-CM | POA: Diagnosis not present

## 2018-06-07 DIAGNOSIS — I4891 Unspecified atrial fibrillation: Secondary | ICD-10-CM | POA: Diagnosis not present

## 2018-06-07 DIAGNOSIS — K219 Gastro-esophageal reflux disease without esophagitis: Secondary | ICD-10-CM | POA: Diagnosis not present

## 2018-06-07 DIAGNOSIS — E785 Hyperlipidemia, unspecified: Secondary | ICD-10-CM | POA: Diagnosis not present

## 2018-06-07 DIAGNOSIS — R509 Fever, unspecified: Secondary | ICD-10-CM | POA: Diagnosis not present

## 2018-06-07 DIAGNOSIS — D649 Anemia, unspecified: Secondary | ICD-10-CM | POA: Diagnosis not present

## 2018-06-07 DIAGNOSIS — R1310 Dysphagia, unspecified: Secondary | ICD-10-CM | POA: Diagnosis not present

## 2018-06-07 DIAGNOSIS — M2450 Contracture, unspecified joint: Secondary | ICD-10-CM | POA: Diagnosis not present

## 2018-06-07 DIAGNOSIS — I25810 Atherosclerosis of coronary artery bypass graft(s) without angina pectoris: Secondary | ICD-10-CM | POA: Diagnosis not present

## 2018-06-07 DIAGNOSIS — R062 Wheezing: Secondary | ICD-10-CM | POA: Diagnosis not present

## 2018-06-07 DIAGNOSIS — N401 Enlarged prostate with lower urinary tract symptoms: Secondary | ICD-10-CM | POA: Diagnosis not present

## 2018-06-07 DIAGNOSIS — I679 Cerebrovascular disease, unspecified: Secondary | ICD-10-CM | POA: Diagnosis not present

## 2018-06-07 LAB — CBC AND DIFFERENTIAL
HCT: 34 — AB (ref 41–53)
Hemoglobin: 11.2 — AB (ref 13.5–17.5)
Neutrophils Absolute: 12
PLATELETS: 185 (ref 150–399)
WBC: 13.9

## 2018-06-07 LAB — BASIC METABOLIC PANEL
BUN: 24 — AB (ref 4–21)
CREATININE: 1.2 (ref 0.6–1.3)
Glucose: 107
Potassium: 4.4 (ref 3.4–5.3)
Sodium: 139 (ref 137–147)

## 2018-06-07 LAB — HEPATIC FUNCTION PANEL
ALK PHOS: 114 (ref 25–125)
ALT: 11 (ref 10–40)
AST: 17 (ref 14–40)
BILIRUBIN, TOTAL: 0.5

## 2018-06-08 ENCOUNTER — Encounter: Payer: Self-pay | Admitting: Adult Health

## 2018-06-08 ENCOUNTER — Non-Acute Institutional Stay: Payer: Medicare Other | Admitting: Adult Health

## 2018-06-08 DIAGNOSIS — R140 Abdominal distension (gaseous): Secondary | ICD-10-CM

## 2018-06-08 DIAGNOSIS — E8729 Other acidosis: Secondary | ICD-10-CM

## 2018-06-08 DIAGNOSIS — I5033 Acute on chronic diastolic (congestive) heart failure: Secondary | ICD-10-CM | POA: Diagnosis not present

## 2018-06-08 DIAGNOSIS — E872 Acidosis: Secondary | ICD-10-CM | POA: Diagnosis not present

## 2018-06-08 DIAGNOSIS — J189 Pneumonia, unspecified organism: Secondary | ICD-10-CM

## 2018-06-08 NOTE — Progress Notes (Signed)
Location:  Occupational psychologist of Service:  ALF (13) Provider:   Cindi Carbon, ANP [place] [phone]   [name], DO  Patient Care Team: [name], DO as PCP - General (Geriatric Medicine) Community, Well Angelique Holm, Margreta Journey, NP as Nurse Practitioner (Nurse Practitioner)  Extended Emergency Contact Information Primary Emergency Contact: [name] Address: [address]          [place], [place] [address] [place] Phone: [phone] Relation: Spouse Secondary Emergency Contact: [name], [place] Phone: [phone] Relation: Daughter  Code Status:  DNR Goals of care: Advanced Directive information Advanced Directives 06/03/2018  Does Patient Have a Medical Advance Directive? Yes  Type of Paramedic of [place];Living will;Out of facility DNR (pink MOST or yellow form)  Does patient want to make changes to medical advance directive? No - Patient declined  Copy of [place] in Chart? Yes - validated most recent copy scanned in chart (See row information)  Pre-existing out of facility DNR order (yellow form or pink MOST form) Yellow form placed in chart (order not valid for inpatient use);Pink MOST form placed in chart (order not valid for inpatient use)     Chief Complaint  Patient presents with  . Acute Visit    f/u temp and wheezing    HPI:  Pt is a 84 y.o. male seen today for an acute visit for temp and wheezing. He resides in memory care and has severe AD and is dependent on staff for all ADLs. He has a hx dysphagia and is on a puree diet with NTL and is followed by hospice. He developed some wheezing on 11/30 and 12/1 and then a temp of 101.7.  The oncall psc provider was notified and duonebs q 6 hrs and a CXR were ordered.  06/07/18 CXR mild pulmonary edema compatible with  pna, acute respiratory distress syndrome, or fluid overload.  He was started on levaquin on 12/1. He is more alert his morning, sats are 92% on room air, and temp rang 97-98.  He has a hx of diastolic CHF/venous insuff and carries edema in both feet but the staff reports that the edema has increased. His weight has also increased from 6-10lbs depending upon variability of weights.  Noted labs below. CO2 19.2 anion gap 17  Past Medical History:  Diagnosis Date  . Allergic rhinitis   . Alzheimer's disease ([facility]) 2013  . Atrial fibrillation ([facility])    Long term anti coagulation w/ warfarin fro stroke risk reduction  . BPH (benign prostatic hyperplasia)   . Chronic venous insufficiency 01/12/2013  . Dementia with behavioral disturbance ([facility]) 12/29/13  . Gait disorder   . GERD (gastroesophageal reflux disease)   . History of CVA (cerebrovascular accident) 2001   Rt.MCA  . Hyperlipidemia   . Lactose intolerance   . Long term (current) use of anticoagulants 11/23/2012   Long-term anticoagulation with warfarin for stroke risk reduction related to AF   Past Surgical History:  Procedure Laterality Date  . CATARACT EXTRACTION W/ INTRAOCULAR LENS  IMPLANT, BILATERAL    . HERNIA REPAIR Right 1970s  . KNEE SURGERY Right 2000  . MASS EXCISION Left 07/30/2013   Procedure: EXCISION OF BASAL CELL CARCINOMA  LEFT FOREHEAD ;  Surgeon: Irene Limbo, MD;  Location: [facility];  Service: Plastics;  Laterality: Left;  . RETINAL DETACHMENT SURGERY Right 2009    Allergies  Allergen Reactions  . Penicillins Rash  . Bee Venom   . Risperidone And Related     Unstable gait    Outpatient Encounter Medications as of 06/08/2018  Medication Sig  . levofloxacin (LEVAQUIN) 500 MG tablet Take 500 mg by mouth daily. 500 mg on day 1, then 250  Mg qd x 6 more days  . saccharomyces boulardii (FLORASTOR) 250 MG capsule Take 250 mg by mouth 2 (two) times daily.  Marland Kitchen apixaban (ELIQUIS) 2.5 MG TABS tablet  Take 2.5 mg by mouth 2 (two) times daily.  . Hypromellose (ARTIFICIAL TEARS OP) Apply 2 drops to eye 3 (three) times daily.  Marland Kitchen ipratropium-albuterol (DUONEB) 0.5-2.5 (3) MG/3ML SOLN Take 3 mLs by nebulization every 6 (six) hours as needed (for wheezing and cough). q 6 hrs prn  . lactose free nutrition (BOOST) LIQD Take 237 mLs by mouth daily.   . nitroGLYCERIN (NITROSTAT) 0.4 MG SL tablet Place 0.4 mg under the tongue every 5 (five) minutes as needed for chest pain.  . potassium chloride SA (K-DUR,KLOR-CON) 20 MEQ tablet Take 40 mEq by mouth daily.   No facility-administered encounter medications on file as of 06/08/2018.     Review of Systems  Unable to perform ROS: Dementia    Immunization History  Administered Date(s) Administered  . Influenza Inj Mdck Quad Pf 04/25/2016  . Influenza Whole 04/20/2013  . Influenza,inj,Quad PF,6+ Mos 04/28/2018  . Influenza-Unspecified 04/12/2014, 04/20/2015, 05/01/2017  . PPD Test 11/29/2012  . Pneumococcal Conjugate-13 08/01/2016  . Pneumococcal Polysaccharide-23 10/07/2017  . Zoster Recombinat (Shingrix) 05/20/2017, 09/30/2017   Pertinent  Health Maintenance Due  Topic Date Due  . INFLUENZA VACCINE  Completed  . PNA vac Low Risk Adult  Completed   Fall Risk  06/03/2018 05/28/2017 06/21/2016  Falls in the past year? 0 No No  Number falls in past yr: 0 - -  Injury with Fall? 0 - -   Functional Status Survey:    Vitals:   06/08/18 1039  BP: (!) 110/59  Pulse: 69  Resp: (!) 22  Temp: 97.6 F (36.4 C)  SpO2: 92%  Weight: 171 lb (77.6 kg)   Body mass index is 23.19 kg/m. Physical Exam  Constitutional: No distress.  HENT:  Head: Normocephalic and atraumatic.  Right Ear: External ear normal.  Left Ear: External ear normal.  Nose: Nose normal.  Mouth/Throat: Oropharynx is clear and moist. No oropharyngeal exudate.  Eyes: Pupils are equal, round, and reactive to light. Conjunctivae are normal. Right eye exhibits discharge. Left eye  exhibits discharge.  Neck: No JVD present. No tracheal deviation present. No thyromegaly present.  Cardiovascular: Normal rate.  No murmur heard. Irreg, irreg BLE edema +3 to ankles and feet  Pulmonary/Chest: Effort normal. No respiratory distress. He has no wheezes. He has rales (bibasilar).  Abdominal: Soft. Bowel sounds are normal. He exhibits distension. There is no tenderness.  Grimaces during abd exam  Lymphadenopathy:    He has cervical adenopathy.  Neurological: He is alert.  Not verbal and not able to f/c  Skin: Skin is warm and dry. He is not diaphoretic.  Vitals reviewed.   Labs reviewed: Recent Labs    04/16/18 0700 05/01/18 1119 06/07/18  NA 143 141 139  K 4.3 4.4 4.4  BUN 20 19 24*  CREATININE 0.9 0.9 1.2   Recent Labs    06/07/18  AST 17  ALT 11  ALKPHOS  114   Recent Labs    06/23/17 09/11/17 06/07/18  WBC 16.2 7.2 13.9  NEUTROABS  --   --  12  HGB 13.9 14.1 11.2*  HCT 44 46 34*  PLT 240 212 185   Lab Results  Component Value Date   TSH 4.17 11/13/2017   No results found for: HGBA1C Lab Results  Component Value Date   CHOL 155 08/31/2015   HDL 33 (A) 08/31/2015   LDLCALC 99 08/31/2015   TRIG 116 08/31/2015    Significant Diagnostic Results in last 30 days:  No results found.  Assessment/Plan  1. Healthcare-associated pneumonia Improved symptoms with no further fevers.  Continue Levaquin to complete 7 day course Pattern on xray concerning for fluid overload. Oxygenating well and does not appear to be distress (not likely ARDS as report suggest, film also under penetrated) Continue Duonebs as needed for sob and wheeze  2. Acute on chronic diastolic congestive heart failure (HCC) Lasix 20 mg qd x 3 days Daily weights x 2 weeks  3. Abdominal distention With regular large BMs Likely due to difficulty passing gas and fluid overload. Place rectal tube until relief noted or 8 hrs, whichever comes first  4. Metabolic acidosis  Avoid IVF  given appearance of fluid overload, recheck BMP   Family/ staff Communication: discussed with his son Dr. Apolonio Schneiders  Labs/tests ordered:  F/U CXR and BMP 12/5

## 2018-06-09 DIAGNOSIS — I509 Heart failure, unspecified: Secondary | ICD-10-CM | POA: Diagnosis not present

## 2018-06-09 DIAGNOSIS — I4891 Unspecified atrial fibrillation: Secondary | ICD-10-CM | POA: Diagnosis not present

## 2018-06-09 DIAGNOSIS — I25810 Atherosclerosis of coronary artery bypass graft(s) without angina pectoris: Secondary | ICD-10-CM | POA: Diagnosis not present

## 2018-06-09 DIAGNOSIS — G309 Alzheimer's disease, unspecified: Secondary | ICD-10-CM | POA: Diagnosis not present

## 2018-06-09 DIAGNOSIS — I679 Cerebrovascular disease, unspecified: Secondary | ICD-10-CM | POA: Diagnosis not present

## 2018-06-09 DIAGNOSIS — N183 Chronic kidney disease, stage 3 (moderate): Secondary | ICD-10-CM | POA: Diagnosis not present

## 2018-06-11 DIAGNOSIS — R05 Cough: Secondary | ICD-10-CM | POA: Diagnosis not present

## 2018-06-11 DIAGNOSIS — D508 Other iron deficiency anemias: Secondary | ICD-10-CM | POA: Diagnosis not present

## 2018-06-11 DIAGNOSIS — D649 Anemia, unspecified: Secondary | ICD-10-CM | POA: Diagnosis not present

## 2018-06-11 LAB — BASIC METABOLIC PANEL
BUN: 20 (ref 4–21)
CREATININE: 1 (ref 0.6–1.3)
GLUCOSE: 88
Potassium: 4 (ref 3.4–5.3)
SODIUM: 140 (ref 137–147)

## 2018-06-15 ENCOUNTER — Non-Acute Institutional Stay: Payer: Medicare Other | Admitting: Adult Health

## 2018-06-15 DIAGNOSIS — I4821 Permanent atrial fibrillation: Secondary | ICD-10-CM

## 2018-06-15 DIAGNOSIS — I5032 Chronic diastolic (congestive) heart failure: Secondary | ICD-10-CM

## 2018-06-15 DIAGNOSIS — I872 Venous insufficiency (chronic) (peripheral): Secondary | ICD-10-CM | POA: Diagnosis not present

## 2018-06-15 DIAGNOSIS — J189 Pneumonia, unspecified organism: Secondary | ICD-10-CM

## 2018-06-15 DIAGNOSIS — R1310 Dysphagia, unspecified: Secondary | ICD-10-CM | POA: Diagnosis not present

## 2018-06-15 DIAGNOSIS — F02818 Dementia in other diseases classified elsewhere, unspecified severity, with other behavioral disturbance: Secondary | ICD-10-CM

## 2018-06-15 DIAGNOSIS — F0281 Dementia in other diseases classified elsewhere with behavioral disturbance: Secondary | ICD-10-CM | POA: Diagnosis not present

## 2018-06-15 DIAGNOSIS — G301 Alzheimer's disease with late onset: Secondary | ICD-10-CM

## 2018-06-16 DIAGNOSIS — N183 Chronic kidney disease, stage 3 (moderate): Secondary | ICD-10-CM | POA: Diagnosis not present

## 2018-06-16 DIAGNOSIS — I509 Heart failure, unspecified: Secondary | ICD-10-CM | POA: Diagnosis not present

## 2018-06-16 DIAGNOSIS — G309 Alzheimer's disease, unspecified: Secondary | ICD-10-CM | POA: Diagnosis not present

## 2018-06-16 DIAGNOSIS — I4891 Unspecified atrial fibrillation: Secondary | ICD-10-CM | POA: Diagnosis not present

## 2018-06-16 DIAGNOSIS — I679 Cerebrovascular disease, unspecified: Secondary | ICD-10-CM | POA: Diagnosis not present

## 2018-06-16 DIAGNOSIS — I25810 Atherosclerosis of coronary artery bypass graft(s) without angina pectoris: Secondary | ICD-10-CM | POA: Diagnosis not present

## 2018-06-17 ENCOUNTER — Encounter: Payer: Self-pay | Admitting: Adult Health

## 2018-06-17 NOTE — Progress Notes (Signed)
Location:  Occupational psychologist of Service:  ALF (13) Provider:   Cindi Carbon, ANP [place] [phone]   [name], DO  Patient Care Team: [name], DO as PCP - General (Geriatric Medicine) Community, Well Angelique Holm, Margreta Journey, NP as Nurse Practitioner (Nurse Practitioner)  Extended Emergency Contact Information Primary Emergency Contact: [name] Address: [address]          [place], [place] [address] [place] Phone: [phone] Relation: Spouse Secondary Emergency Contact: [name], [place] Phone: [phone] Relation: Daughter  Code Status:  DNR Goals of care: Advanced Directive information Advanced Directives 06/03/2018  Does Patient Have a Medical Advance Directive? Yes  Type of Paramedic of [place];Living will;Out of facility DNR (pink MOST or yellow form)  Does patient want to make changes to medical advance directive? No - Patient declined  Copy of [place] in Chart? Yes - validated most recent copy scanned in chart (See row information)  Pre-existing out of facility DNR order (yellow form or pink MOST form) Yellow form placed in chart (order not valid for inpatient use);Pink MOST form placed in chart (order not valid for inpatient use)     Chief Complaint  Patient presents with  . Medical Management of Chronic Issues    HPI:  Pt is a 84 y.o. male seen today for medical management of chronic diseases.  He is followed by hospice for advanced dementia and dysphagia.  HCAP: treated for pneumonia with fever and wheezing on 12/1 with resolutions of symptoms noted during this visit. Sats are WNL and no fever. Back to eating 100% of meals and alert per nurse.  Diastolic CHF: treated with 3 days of lasix on 12/2 for weight gain and wheezing with pulmonary edema found on  CXR. Weight down to 167 lbs.   Dysphagia: severe, eats puree diet with NTL  AD: non verbal and dependent on staff for all ADLs. Can be resistive to care and pulls at his foley catheter.   Venous insuff: has a large amt of edema to both feet chronically  Afib: rate controlled without meds. Remains on eliquis without s/e.  Functional status: hoyer lift, incontinent Past Medical History:  Diagnosis Date  . Allergic rhinitis   . Alzheimer's disease ([facility]) 2013  . Atrial fibrillation ([facility])    Long term anti coagulation w/ warfarin fro stroke risk reduction  . BPH (benign prostatic hyperplasia)   . Chronic venous insufficiency 01/12/2013  . Dementia with behavioral disturbance ([facility]) 12/29/13  . Gait disorder   . GERD (gastroesophageal reflux disease)   . History of CVA (cerebrovascular accident) 2001   Rt.MCA  . Hyperlipidemia   . Lactose intolerance   . Long term (current) use of anticoagulants 11/23/2012   Long-term anticoagulation with warfarin for stroke risk reduction related to AF   Past Surgical History:  Procedure Laterality Date  . CATARACT EXTRACTION W/ INTRAOCULAR LENS  IMPLANT, BILATERAL    . HERNIA REPAIR Right 1970s  . KNEE SURGERY Right 2000  . MASS EXCISION Left 07/30/2013   Procedure: EXCISION OF BASAL CELL CARCINOMA  LEFT FOREHEAD ;  Surgeon: Irene Limbo, MD;  Location: [facility];  Service: Plastics;  Laterality: Left;  . RETINAL DETACHMENT SURGERY Right 2009    Allergies  Allergen Reactions  . Penicillins Rash  . Bee  Venom   . Risperidone And Related     Unstable gait    Outpatient Encounter Medications as of 06/15/2018  Medication Sig  . [EXPIRED] levofloxacin (LEVAQUIN) 250 MG tablet Take 250 mg by mouth daily.  Marland Kitchen apixaban (ELIQUIS) 2.5 MG TABS tablet Take 2.5 mg by mouth 2 (two) times daily.  Marland Kitchen ipratropium-albuterol (DUONEB) 0.5-2.5 (3) MG/3ML SOLN Take 3 mLs by nebulization every 6 (six) hours as needed (for wheezing and cough). q 6  hrs prn  . lactose free nutrition (BOOST) LIQD Take 237 mLs by mouth daily.   . nitroGLYCERIN (NITROSTAT) 0.4 MG SL tablet Place 0.4 mg under the tongue every 5 (five) minutes as needed for chest pain.  . potassium chloride SA (K-DUR,KLOR-CON) 20 MEQ tablet Take 40 mEq by mouth daily.  . [EXPIRED] saccharomyces boulardii (FLORASTOR) 250 MG capsule Take 250 mg by mouth 2 (two) times daily.  . [DISCONTINUED] Hypromellose (ARTIFICIAL TEARS OP) Apply 2 drops to eye 3 (three) times daily.   No facility-administered encounter medications on file as of 06/15/2018.     Review of Systems  Unable to perform ROS: Dementia    Immunization History  Administered Date(s) Administered  . Influenza Inj Mdck Quad Pf 04/25/2016  . Influenza Whole 04/20/2013  . Influenza,inj,Quad PF,6+ Mos 04/28/2018  . Influenza-Unspecified 04/12/2014, 04/20/2015, 05/01/2017  . PPD Test 11/29/2012  . Pneumococcal Conjugate-13 08/01/2016  . Pneumococcal Polysaccharide-23 10/07/2017  . Zoster Recombinat (Shingrix) 05/20/2017, 09/30/2017   Pertinent  Health Maintenance Due  Topic Date Due  . INFLUENZA VACCINE  Completed  . PNA vac Low Risk Adult  Completed   Fall Risk  06/03/2018 05/28/2017 06/21/2016  Falls in the past year? 0 No No  Number falls in past yr: 0 - -  Injury with Fall? 0 - -   Functional Status Survey:    Vitals:   06/17/18 0826  Weight: 167 lb (75.8 kg)   Body mass index is 22.65 kg/m. Physical Exam  Constitutional: No distress.  HENT:  Head: Normocephalic and atraumatic.  Mouth/Throat: No oropharyngeal exudate.  Eyes: Pupils are equal, round, and reactive to light. Conjunctivae are normal. Right eye exhibits no discharge. Left eye exhibits no discharge.  Neck: No JVD present.  Cardiovascular: Normal rate and regular rhythm.  No murmur heard. Pulmonary/Chest: Effort normal and breath sounds normal. No respiratory distress.  Abdominal: Soft. Bowel sounds are normal. He exhibits no  distension. There is no tenderness.  Musculoskeletal: He exhibits no tenderness or deformity.  Decreased ROM to BUE and BLE  Lymphadenopathy:    He has no cervical adenopathy.  Neurological: He is alert.  Not verbal or able to follow commands.   Skin: Skin is dry. He is not diaphoretic.  Nursing note and vitals reviewed.   Labs reviewed: Recent Labs    05/01/18 1119 06/07/18 06/11/18  NA 141 139 140  K 4.4 4.4 4.0  BUN 19 24* 20  CREATININE 0.9 1.2 1.0   Recent Labs    06/07/18  AST 17  ALT 11  ALKPHOS 114   Recent Labs    06/23/17 09/11/17 06/07/18  WBC 16.2 7.2 13.9  NEUTROABS  --   --  12  HGB 13.9 14.1 11.2*  HCT 44 46 34*  PLT 240 212 185   Lab Results  Component Value Date   TSH 4.17 11/13/2017   No results found for: HGBA1C Lab Results  Component Value Date   CHOL 155 08/31/2015   HDL 33 (A) 08/31/2015  [place] 99 08/31/2015   TRIG 116 08/31/2015    Significant Diagnostic Results in last 30 days:  No results found.  Assessment/Plan  1. HCAP (healthcare-associated pneumonia) Has improved after completion of Levaquin and appears to be back at baseline  2. Chronic diastolic congestive heart failure (HCC) Compensated after 3 day course of lasix  3. Permanent atrial fibrillation Rate controlled Continue Eliquis for CVA risk reduction   4. Chronic venous insufficiency Compression hose on in the am and off in the pm Keep legs elevated  5. Late onset Alzheimer's disease with behavioral disturbance (HCC) Severe nearing end stages of the disease Followed by hospice  6. Dysphagia, unspecified type Continues to have episodes of pna associated with this problem. His family continues to want treatment for infections but would like to avoid hospitalizations Continue puree diet with NTL and asp prec with 1:1 supervision  Family/ staff Communication: discussed with nurse  Labs/tests ordered:  BMP bi monthly per family request

## 2018-06-25 DIAGNOSIS — B351 Tinea unguium: Secondary | ICD-10-CM | POA: Diagnosis not present

## 2018-06-25 DIAGNOSIS — N183 Chronic kidney disease, stage 3 (moderate): Secondary | ICD-10-CM | POA: Diagnosis not present

## 2018-06-25 DIAGNOSIS — D649 Anemia, unspecified: Secondary | ICD-10-CM | POA: Diagnosis not present

## 2018-06-26 DIAGNOSIS — I509 Heart failure, unspecified: Secondary | ICD-10-CM | POA: Diagnosis not present

## 2018-06-26 DIAGNOSIS — I25810 Atherosclerosis of coronary artery bypass graft(s) without angina pectoris: Secondary | ICD-10-CM | POA: Diagnosis not present

## 2018-06-26 DIAGNOSIS — G309 Alzheimer's disease, unspecified: Secondary | ICD-10-CM | POA: Diagnosis not present

## 2018-06-26 DIAGNOSIS — N183 Chronic kidney disease, stage 3 (moderate): Secondary | ICD-10-CM | POA: Diagnosis not present

## 2018-06-26 DIAGNOSIS — I4891 Unspecified atrial fibrillation: Secondary | ICD-10-CM | POA: Diagnosis not present

## 2018-06-26 DIAGNOSIS — I679 Cerebrovascular disease, unspecified: Secondary | ICD-10-CM | POA: Diagnosis not present

## 2018-07-02 DIAGNOSIS — I509 Heart failure, unspecified: Secondary | ICD-10-CM | POA: Diagnosis not present

## 2018-07-02 DIAGNOSIS — I679 Cerebrovascular disease, unspecified: Secondary | ICD-10-CM | POA: Diagnosis not present

## 2018-07-02 DIAGNOSIS — I4891 Unspecified atrial fibrillation: Secondary | ICD-10-CM | POA: Diagnosis not present

## 2018-07-02 DIAGNOSIS — N183 Chronic kidney disease, stage 3 (moderate): Secondary | ICD-10-CM | POA: Diagnosis not present

## 2018-07-02 DIAGNOSIS — G309 Alzheimer's disease, unspecified: Secondary | ICD-10-CM | POA: Diagnosis not present

## 2018-07-02 DIAGNOSIS — I25810 Atherosclerosis of coronary artery bypass graft(s) without angina pectoris: Secondary | ICD-10-CM | POA: Diagnosis not present

## 2018-07-08 DIAGNOSIS — M2450 Contracture, unspecified joint: Secondary | ICD-10-CM | POA: Diagnosis not present

## 2018-07-08 DIAGNOSIS — N401 Enlarged prostate with lower urinary tract symptoms: Secondary | ICD-10-CM | POA: Diagnosis not present

## 2018-07-08 DIAGNOSIS — I679 Cerebrovascular disease, unspecified: Secondary | ICD-10-CM | POA: Diagnosis not present

## 2018-07-08 DIAGNOSIS — G309 Alzheimer's disease, unspecified: Secondary | ICD-10-CM | POA: Diagnosis not present

## 2018-07-08 DIAGNOSIS — I509 Heart failure, unspecified: Secondary | ICD-10-CM | POA: Diagnosis not present

## 2018-07-08 DIAGNOSIS — N183 Chronic kidney disease, stage 3 (moderate): Secondary | ICD-10-CM | POA: Diagnosis not present

## 2018-07-08 DIAGNOSIS — I4891 Unspecified atrial fibrillation: Secondary | ICD-10-CM | POA: Diagnosis not present

## 2018-07-08 DIAGNOSIS — E785 Hyperlipidemia, unspecified: Secondary | ICD-10-CM | POA: Diagnosis not present

## 2018-07-08 DIAGNOSIS — R1310 Dysphagia, unspecified: Secondary | ICD-10-CM | POA: Diagnosis not present

## 2018-07-08 DIAGNOSIS — K219 Gastro-esophageal reflux disease without esophagitis: Secondary | ICD-10-CM | POA: Diagnosis not present

## 2018-07-08 DIAGNOSIS — I25810 Atherosclerosis of coronary artery bypass graft(s) without angina pectoris: Secondary | ICD-10-CM | POA: Diagnosis not present

## 2018-07-09 DIAGNOSIS — N183 Chronic kidney disease, stage 3 (moderate): Secondary | ICD-10-CM | POA: Diagnosis not present

## 2018-07-09 DIAGNOSIS — D649 Anemia, unspecified: Secondary | ICD-10-CM | POA: Diagnosis not present

## 2018-07-10 DIAGNOSIS — I25810 Atherosclerosis of coronary artery bypass graft(s) without angina pectoris: Secondary | ICD-10-CM | POA: Diagnosis not present

## 2018-07-10 DIAGNOSIS — I509 Heart failure, unspecified: Secondary | ICD-10-CM | POA: Diagnosis not present

## 2018-07-10 DIAGNOSIS — I679 Cerebrovascular disease, unspecified: Secondary | ICD-10-CM | POA: Diagnosis not present

## 2018-07-10 DIAGNOSIS — I4891 Unspecified atrial fibrillation: Secondary | ICD-10-CM | POA: Diagnosis not present

## 2018-07-10 DIAGNOSIS — N183 Chronic kidney disease, stage 3 (moderate): Secondary | ICD-10-CM | POA: Diagnosis not present

## 2018-07-10 DIAGNOSIS — G309 Alzheimer's disease, unspecified: Secondary | ICD-10-CM | POA: Diagnosis not present

## 2018-07-16 DIAGNOSIS — N183 Chronic kidney disease, stage 3 (moderate): Secondary | ICD-10-CM | POA: Diagnosis not present

## 2018-07-16 DIAGNOSIS — I25810 Atherosclerosis of coronary artery bypass graft(s) without angina pectoris: Secondary | ICD-10-CM | POA: Diagnosis not present

## 2018-07-16 DIAGNOSIS — I679 Cerebrovascular disease, unspecified: Secondary | ICD-10-CM | POA: Diagnosis not present

## 2018-07-16 DIAGNOSIS — I4891 Unspecified atrial fibrillation: Secondary | ICD-10-CM | POA: Diagnosis not present

## 2018-07-16 DIAGNOSIS — G309 Alzheimer's disease, unspecified: Secondary | ICD-10-CM | POA: Diagnosis not present

## 2018-07-16 DIAGNOSIS — I509 Heart failure, unspecified: Secondary | ICD-10-CM | POA: Diagnosis not present

## 2018-07-21 DIAGNOSIS — I4891 Unspecified atrial fibrillation: Secondary | ICD-10-CM | POA: Diagnosis not present

## 2018-07-21 DIAGNOSIS — I509 Heart failure, unspecified: Secondary | ICD-10-CM | POA: Diagnosis not present

## 2018-07-21 DIAGNOSIS — I679 Cerebrovascular disease, unspecified: Secondary | ICD-10-CM | POA: Diagnosis not present

## 2018-07-21 DIAGNOSIS — G309 Alzheimer's disease, unspecified: Secondary | ICD-10-CM | POA: Diagnosis not present

## 2018-07-21 DIAGNOSIS — I25810 Atherosclerosis of coronary artery bypass graft(s) without angina pectoris: Secondary | ICD-10-CM | POA: Diagnosis not present

## 2018-07-21 DIAGNOSIS — N183 Chronic kidney disease, stage 3 (moderate): Secondary | ICD-10-CM | POA: Diagnosis not present

## 2018-07-23 ENCOUNTER — Non-Acute Institutional Stay: Payer: Medicare Other | Admitting: Adult Health

## 2018-07-23 DIAGNOSIS — R635 Abnormal weight gain: Secondary | ICD-10-CM

## 2018-07-23 DIAGNOSIS — D649 Anemia, unspecified: Secondary | ICD-10-CM | POA: Diagnosis not present

## 2018-07-23 DIAGNOSIS — N183 Chronic kidney disease, stage 3 (moderate): Secondary | ICD-10-CM | POA: Diagnosis not present

## 2018-07-23 LAB — BASIC METABOLIC PANEL
BUN: 31 — AB (ref 4–21)
Creatinine: 1.5 — AB (ref 0.6–1.3)
GLUCOSE: 93
Potassium: 4.1 (ref 3.4–5.3)
Sodium: 142 (ref 137–147)

## 2018-07-24 ENCOUNTER — Encounter: Payer: Self-pay | Admitting: Adult Health

## 2018-07-24 NOTE — Progress Notes (Signed)
Location:  Occupational psychologist of Service:  ALF (13) Provider:   Cindi Carbon, ANP [place] [phone]   [name], DO  Patient Care Team: [name], DO as PCP - General (Geriatric Medicine) Community, Well Angelique Holm, Margreta Journey, NP as Nurse Practitioner (Nurse Practitioner)  Extended Emergency Contact Information Primary Emergency Contact: [name] Address: [address]          [place], [place] [address] [place] Phone: [phone] Relation: Spouse Secondary Emergency Contact: [name], [place] Phone: [phone] Relation: Daughter  Code Status:  DNR Goals of care: Advanced Directive information Advanced Directives 06/03/2018  Does Patient Have a Medical Advance Directive? Yes  Type of Paramedic of [place];Living will;Out of facility DNR (pink MOST or yellow form)  Does patient want to make changes to medical advance directive? No - Patient declined  Copy of [place] in Chart? Yes - validated most recent copy scanned in chart (See row information)  Pre-existing out of facility DNR order (yellow form or pink MOST form) Yellow form placed in chart (order not valid for inpatient use);Pink MOST form placed in chart (order not valid for inpatient use)     Chief Complaint  Patient presents with  . Acute Visit    weight gain    HPI:  Pt is a 85 y.o. male seen today for an acute visit for weight gain. The hospice nurse reported that he had a weight gain of 7 lbs. He has had a steady increase in the weights that have been obtained over the past two weeks. (weights ordered Monday and Friday).  He has a hx of diastolic chf and venous insuff and carries edema chronically in both feet. He has not had any change in vital signs and no signs of sob.    Past Medical History:  Diagnosis  Date  . Allergic rhinitis   . Alzheimer's disease ([facility]) 2013  . Atrial fibrillation ([facility])    Long term anti coagulation w/ warfarin fro stroke risk reduction  . BPH (benign prostatic hyperplasia)   . Chronic venous insufficiency 01/12/2013  . Dementia with behavioral disturbance ([facility]) 12/29/13  . Gait disorder   . GERD (gastroesophageal reflux disease)   . History of CVA (cerebrovascular accident) 2001   Rt.MCA  . Hyperlipidemia   . Lactose intolerance   . Long term (current) use of anticoagulants 11/23/2012   Long-term anticoagulation with warfarin for stroke risk reduction related to AF   Past Surgical History:  Procedure Laterality Date  . CATARACT EXTRACTION W/ INTRAOCULAR LENS  IMPLANT, BILATERAL    . HERNIA REPAIR Right 1970s  . KNEE SURGERY Right 2000  . MASS EXCISION Left 07/30/2013   Procedure: EXCISION OF BASAL CELL CARCINOMA  LEFT FOREHEAD ;  Surgeon: Irene Limbo, MD;  Location: [facility];  Service: Plastics;  Laterality: Left;  . RETINAL DETACHMENT SURGERY Right 2009    Allergies  Allergen Reactions  . Penicillins Rash  . Bee Venom   . Risperidone And Related     Unstable gait    Outpatient Encounter Medications as of 07/23/2018  Medication Sig  . apixaban (ELIQUIS) 2.5 MG TABS tablet Take 2.5 mg by mouth 2 (two) times daily.  Marland Kitchen ipratropium-albuterol (DUONEB) 0.5-2.5 (3) MG/3ML SOLN Take 3 mLs by nebulization every 6 (six) hours as needed (  for wheezing and cough). q 6 hrs prn  . lactose free nutrition (BOOST) LIQD Take 237 mLs by mouth daily.   . nitroGLYCERIN (NITROSTAT) 0.4 MG SL tablet Place 0.4 mg under the tongue every 5 (five) minutes as needed for chest pain.  . potassium chloride SA (K-DUR,KLOR-CON) 20 MEQ tablet Take 40 mEq by mouth daily.   No facility-administered encounter medications on file as of 07/23/2018.     Review of Systems  Unable to perform ROS: Dementia    Immunization History  Administered Date(s) Administered  .  Influenza Inj Mdck Quad Pf 04/25/2016  . Influenza Whole 04/20/2013  . Influenza,inj,Quad PF,6+ Mos 04/28/2018  . Influenza-Unspecified 04/12/2014, 04/20/2015, 05/01/2017  . PPD Test 11/29/2012  . Pneumococcal Conjugate-13 08/01/2016  . Pneumococcal Polysaccharide-23 10/07/2017  . Zoster Recombinat (Shingrix) 05/20/2017, 09/30/2017   Pertinent  Health Maintenance Due  Topic Date Due  . INFLUENZA VACCINE  Completed  . PNA vac Low Risk Adult  Completed   Fall Risk  06/03/2018 05/28/2017 06/21/2016  Falls in the past year? 0 No No  Number falls in past yr: 0 - -  Injury with Fall? 0 - -   Functional Status Survey:    Vitals:   07/24/18 1230  Weight: 177 lb (80.3 kg)   Body mass index is 24.01 kg/m. Physical Exam Constitutional:      General: He is not in acute distress.    Appearance: He is not toxic-appearing.  HENT:     Head: Atraumatic.  Cardiovascular:     Rate and Rhythm: Normal rate and regular rhythm.     Heart sounds: No murmur.  Pulmonary:     Effort: Pulmonary effort is normal. No respiratory distress.     Breath sounds: Normal breath sounds. No stridor. No wheezing or rhonchi.  Abdominal:     General: Abdomen is flat. Bowel sounds are normal.     Palpations: Abdomen is soft.  Musculoskeletal:     Right lower leg: Edema present.     Left lower leg: Edema present.  Neurological:     Mental Status: He is alert. Mental status is at baseline.     Comments: Not verbal and not able to f/c  Psychiatric:        Mood and Affect: Mood normal.     Labs reviewed: Recent Labs    06/07/18 06/11/18 07/23/18  NA 139 140 142  K 4.4 4.0 4.1  BUN 24* 20 31*  CREATININE 1.2 1.0 1.5*   Recent Labs    06/07/18  AST 17  ALT 11  ALKPHOS 114   Recent Labs    09/11/17 06/07/18  WBC 7.2 13.9  NEUTROABS  --  12  HGB 14.1 11.2*  HCT 46 34*  PLT 212 185   Lab Results  Component Value Date   TSH 4.17 11/13/2017   No results found for: HGBA1C Lab Results    Component Value Date   CHOL 155 08/31/2015   HDL 33 (A) 08/31/2015   LDLCALC 99 08/31/2015   TRIG 116 08/31/2015    Significant Diagnostic Results in last 30 days:  No results found.  Assessment/Plan 1. Weight gain ? If this is related to diastolic chf  He is not sob and appears comfortable. Edema to feet remains unchanged.  Weight has not been re done today (and now he is in bed) and Cr is rising so would avoid aggressive diuresis. Staff to reweigh in the morning and weights will be changed to daily  from Monday and Fridays.  Give one dose of lasix 20 mg now.     Family/ staff Communication: staff  Labs/tests ordered:  NA

## 2018-07-31 DIAGNOSIS — I679 Cerebrovascular disease, unspecified: Secondary | ICD-10-CM | POA: Diagnosis not present

## 2018-07-31 DIAGNOSIS — I509 Heart failure, unspecified: Secondary | ICD-10-CM | POA: Diagnosis not present

## 2018-07-31 DIAGNOSIS — I25810 Atherosclerosis of coronary artery bypass graft(s) without angina pectoris: Secondary | ICD-10-CM | POA: Diagnosis not present

## 2018-07-31 DIAGNOSIS — N183 Chronic kidney disease, stage 3 (moderate): Secondary | ICD-10-CM | POA: Diagnosis not present

## 2018-07-31 DIAGNOSIS — I4891 Unspecified atrial fibrillation: Secondary | ICD-10-CM | POA: Diagnosis not present

## 2018-07-31 DIAGNOSIS — G309 Alzheimer's disease, unspecified: Secondary | ICD-10-CM | POA: Diagnosis not present

## 2018-08-03 DIAGNOSIS — I4891 Unspecified atrial fibrillation: Secondary | ICD-10-CM | POA: Diagnosis not present

## 2018-08-03 DIAGNOSIS — N183 Chronic kidney disease, stage 3 (moderate): Secondary | ICD-10-CM | POA: Diagnosis not present

## 2018-08-03 DIAGNOSIS — I679 Cerebrovascular disease, unspecified: Secondary | ICD-10-CM | POA: Diagnosis not present

## 2018-08-03 DIAGNOSIS — I25810 Atherosclerosis of coronary artery bypass graft(s) without angina pectoris: Secondary | ICD-10-CM | POA: Diagnosis not present

## 2018-08-03 DIAGNOSIS — G309 Alzheimer's disease, unspecified: Secondary | ICD-10-CM | POA: Diagnosis not present

## 2018-08-03 DIAGNOSIS — I509 Heart failure, unspecified: Secondary | ICD-10-CM | POA: Diagnosis not present

## 2018-08-06 DIAGNOSIS — N183 Chronic kidney disease, stage 3 (moderate): Secondary | ICD-10-CM | POA: Diagnosis not present

## 2018-08-06 DIAGNOSIS — D649 Anemia, unspecified: Secondary | ICD-10-CM | POA: Diagnosis not present

## 2018-08-06 LAB — BASIC METABOLIC PANEL
BUN: 43 — AB (ref 4–21)
CREATININE: 2 — AB (ref 0.6–1.3)
GLUCOSE: 102
POTASSIUM: 4.6 (ref 3.4–5.3)
SODIUM: 146 (ref 137–147)

## 2018-08-07 ENCOUNTER — Encounter: Payer: Self-pay | Admitting: Adult Health

## 2018-08-07 ENCOUNTER — Non-Acute Institutional Stay: Payer: Medicare Other | Admitting: Adult Health

## 2018-08-07 DIAGNOSIS — G301 Alzheimer's disease with late onset: Secondary | ICD-10-CM | POA: Diagnosis not present

## 2018-08-07 DIAGNOSIS — I5032 Chronic diastolic (congestive) heart failure: Secondary | ICD-10-CM

## 2018-08-07 DIAGNOSIS — N179 Acute kidney failure, unspecified: Secondary | ICD-10-CM

## 2018-08-07 DIAGNOSIS — F02818 Dementia in other diseases classified elsewhere, unspecified severity, with other behavioral disturbance: Secondary | ICD-10-CM

## 2018-08-07 DIAGNOSIS — N183 Chronic kidney disease, stage 3 (moderate): Secondary | ICD-10-CM | POA: Diagnosis not present

## 2018-08-07 DIAGNOSIS — Z79899 Other long term (current) drug therapy: Secondary | ICD-10-CM | POA: Diagnosis not present

## 2018-08-07 DIAGNOSIS — G309 Alzheimer's disease, unspecified: Secondary | ICD-10-CM | POA: Diagnosis not present

## 2018-08-07 DIAGNOSIS — I872 Venous insufficiency (chronic) (peripheral): Secondary | ICD-10-CM

## 2018-08-07 DIAGNOSIS — F0281 Dementia in other diseases classified elsewhere with behavioral disturbance: Secondary | ICD-10-CM | POA: Diagnosis not present

## 2018-08-07 DIAGNOSIS — N390 Urinary tract infection, site not specified: Secondary | ICD-10-CM | POA: Diagnosis not present

## 2018-08-07 DIAGNOSIS — R062 Wheezing: Secondary | ICD-10-CM | POA: Diagnosis not present

## 2018-08-07 DIAGNOSIS — I4891 Unspecified atrial fibrillation: Secondary | ICD-10-CM | POA: Diagnosis not present

## 2018-08-07 DIAGNOSIS — I679 Cerebrovascular disease, unspecified: Secondary | ICD-10-CM | POA: Diagnosis not present

## 2018-08-07 DIAGNOSIS — I509 Heart failure, unspecified: Secondary | ICD-10-CM | POA: Diagnosis not present

## 2018-08-07 DIAGNOSIS — R319 Hematuria, unspecified: Secondary | ICD-10-CM | POA: Diagnosis not present

## 2018-08-07 DIAGNOSIS — R339 Retention of urine, unspecified: Secondary | ICD-10-CM | POA: Diagnosis not present

## 2018-08-07 DIAGNOSIS — I25810 Atherosclerosis of coronary artery bypass graft(s) without angina pectoris: Secondary | ICD-10-CM | POA: Diagnosis not present

## 2018-08-07 NOTE — Progress Notes (Signed)
Location:  Occupational psychologist of Service:  ALF (13) Provider:   Cindi Carbon, ANP [place] [phone]   [name], DO  Patient Care Team: [name], DO as PCP - General (Geriatric Medicine) [facility], Margreta Journey, NP as Nurse Practitioner (Nurse Practitioner)  Extended Emergency Contact Information Primary Emergency Contact: [name] Address: [address]          [place], [place] [address] [place] Phone: [phone] Relation: Spouse Secondary Emergency Contact: [name], [place] Phone: [phone] Relation: Daughter  Code Status:  DNR Goals of care: Advanced Directive information Advanced Directives 06/03/2018  Does Patient Have a Medical Advance Directive? Yes  Type of Paramedic of [place];Living will;Out of facility DNR (pink MOST or yellow form)  Does patient want to make changes to medical advance directive? No - Patient declined  Copy of [place] in Chart? Yes - validated most recent copy scanned in chart (See row information)  Pre-existing out of facility DNR order (yellow form or pink MOST form) Yellow form placed in chart (order not valid for inpatient use);Pink MOST form placed in chart (order not valid for inpatient use)     Chief Complaint  Patient presents with  . Acute Visit    f/u increased BUN/Cr    HPI:  Pt is a 85 y.o. male seen today for an acute visit for follow up regarding acute on chronic renal failure. He has a hx of severe AD, CHF, CKD, afib, OA, CVA, venous insuff, BPH with retention, and diastolic CHF.  He has labs drawn bi monthly per family request. BUN and CR have been rising from a baseline of 20/1.0 to 43/2.0.  He has been eating and drinking as we usual with normal output in his foley catheter. No reports of fever, chills,  cough, sob, etc. He has chronic edema in both feet and the staff were having trouble with compression hose and so we tried wraps but those left some bruising so now he is just wearing sock. Unfortunately the edema is worse in his feet but his weight is stable at 172 lbs. Down from 177 two weeks ago after one dose of 20 mg of lasix.    Past Medical History:  Diagnosis Date  . Allergic rhinitis   . Alzheimer's disease ([place]) 2013  . Atrial fibrillation ([place])    Long term anti coagulation w/ warfarin fro stroke risk reduction  . BPH (benign prostatic hyperplasia)   . Chronic venous insufficiency 01/12/2013  . Dementia with behavioral disturbance ([place]) 12/29/13  . Gait disorder   . GERD (gastroesophageal reflux disease)   . History of CVA (cerebrovascular accident) 2001   Rt.MCA  . Hyperlipidemia   . Lactose intolerance   . Long term (current) use of anticoagulants 11/23/2012   Long-term anticoagulation with warfarin for stroke risk reduction related to AF   Past Surgical History:  Procedure Laterality Date  . CATARACT EXTRACTION W/ INTRAOCULAR LENS  IMPLANT, BILATERAL    . HERNIA REPAIR Right 1970s  . KNEE SURGERY Right 2000  . MASS EXCISION Left 07/30/2013   Procedure: EXCISION OF BASAL CELL CARCINOMA  LEFT FOREHEAD ;  Surgeon: Irene Limbo, MD;  Location: [facility];  Service: Plastics;  Laterality: Left;  . RETINAL DETACHMENT SURGERY Right 2009    Allergies  Allergen Reactions  . Penicillins Rash  . Bee Venom   . Risperidone And Related     Unstable gait    Outpatient Encounter Medications as of 08/07/2018  Medication Sig  . apixaban (ELIQUIS) 2.5 MG TABS tablet Take 2.5 mg by mouth 2 (two) times daily.  Marland Kitchen ipratropium-albuterol (DUONEB) 0.5-2.5 (3) MG/3ML SOLN Take 3 mLs by nebulization every 6 (six) hours as needed (for wheezing and cough). q 6 hrs prn  . lactose free nutrition (BOOST) LIQD Take 237 mLs by mouth daily.   . nitroGLYCERIN (NITROSTAT) 0.4 MG SL  tablet Place 0.4 mg under the tongue every 5 (five) minutes as needed for chest pain.  . potassium chloride SA (K-DUR,KLOR-CON) 20 MEQ tablet Take 40 mEq by mouth daily.   No facility-administered encounter medications on file as of 08/07/2018.     Review of Systems  Unable to perform ROS: Dementia    Immunization History  Administered Date(s) Administered  . Influenza Inj Mdck Quad Pf 04/25/2016  . Influenza Whole 04/20/2013  . Influenza,inj,Quad PF,6+ Mos 04/28/2018  . Influenza-Unspecified 04/12/2014, 04/20/2015, 05/01/2017  . PPD Test 11/29/2012  . Pneumococcal Conjugate-13 08/01/2016  . Pneumococcal Polysaccharide-23 10/07/2017  . Zoster Recombinat (Shingrix) 05/20/2017, 09/30/2017   Pertinent  Health Maintenance Due  Topic Date Due  . INFLUENZA VACCINE  Completed  . PNA vac Low Risk Adult  Completed   Fall Risk  06/03/2018 05/28/2017 06/21/2016  Falls in the past year? 0 No No  Number falls in past yr: 0 - -  Injury with Fall? 0 - -   Functional Status Survey:    Vitals:   08/07/18 1327  BP: 129/75  Pulse: 82  Resp: 18  Temp: (!) 96.6 F (35.9 C)  SpO2: 99%  Weight: 172 lb 4.8 oz (78.2 kg)   Body mass index is 23.37 kg/m.  Wt Readings from Last 3 Encounters:  08/07/18 172 lb 4.8 oz (78.2 kg)  07/24/18 177 lb (80.3 kg)  06/17/18 167 lb (75.8 kg)   Physical Exam Vitals signs and nursing note reviewed.  Constitutional:      General: He is not in acute distress.    Appearance: He is not diaphoretic.  HENT:     Head: Normocephalic and atraumatic.     Nose: Nose normal.     Mouth/Throat:     Mouth: Mucous membranes are moist.     Pharynx: Oropharynx is clear. No oropharyngeal exudate or posterior oropharyngeal erythema.  Eyes:     Conjunctiva/sclera: Conjunctivae normal.     Pupils: Pupils are equal, round, and reactive to light.  Neck:     Thyroid: No thyromegaly.     Vascular: No JVD.     Trachea: No tracheal deviation.  Cardiovascular:     Rate  and Rhythm: Normal rate. Rhythm irregular.     Heart sounds: No murmur.  Pulmonary:     Effort: Pulmonary effort is normal. No respiratory distress.     Breath sounds: Wheezing present.  Abdominal:     General: Bowel sounds are normal. There is no distension.     Palpations: Abdomen is soft.     Tenderness: There is no abdominal tenderness.  Musculoskeletal:     Right lower leg: Edema (pedal +3) present.     Left lower leg: Edema (pedal +3) present.  Lymphadenopathy:     Cervical: No cervical adenopathy.  Skin:    General: Skin is warm and dry.     Findings: Rash (petechial rash to  the left foot dorsum) present.  Neurological:     General: No focal deficit present.     Mental Status: He is alert. Mental status is at baseline.     Labs reviewed: Recent Labs    06/11/18 07/23/18 08/06/18  NA 140 142 146  K 4.0 4.1 4.6  BUN 20 31* 43*  CREATININE 1.0 1.5* 2.0*   Recent Labs    06/07/18  AST 17  ALT 11  ALKPHOS 114   Recent Labs    09/11/17 06/07/18  WBC 7.2 13.9  NEUTROABS  --  12  HGB 14.1 11.2*  HCT 46 34*  PLT 212 185   Lab Results  Component Value Date   TSH 4.17 11/13/2017   No results found for: HGBA1C Lab Results  Component Value Date   CHOL 155 08/31/2015   HDL 33 (A) 08/31/2015   LDLCALC 99 08/31/2015   TRIG 116 08/31/2015    Significant Diagnostic Results in last 30 days:  No results found.  Assessment/Plan 1. Acute renal failure superimposed on stage 3 chronic kidney disease, unspecified acute renal failure type ([place]) Worsening with no change in intake or output  Repeat bmp due on 2/1.  ?if there is underlying infection or worsening hydronephrosis. He has a hx of this to the left kidney but no additional intervention was taken due to his advanced dementia. UA pending but no outward sign of infection at this time. We agreed to avoid hospitalizations and he has DNR. His son, Dr Cheryln Manly would like to continue to treat him for reversible issues  here at [facility].    2. Chronic diastolic congestive heart failure (HCC) Wheezing on exam after 500 cc of IVF Weight down 5 lbs from two weeks ago.   Check CXR, avoid further lasix due to worsening renal failure unless absolutely necessary. Would also avoid additional IVF due to wheezing.   3. Chronic venous insufficiency Worse to feet, try to order the next size up of compression hose with a zipper for ease of application.   4. Late onset Alzheimer's disease with behavioral disturbance (HCC) Severe nearing end stage. Continue supportive care in the memory unit.    Family/ staff Communication: discussed with her son Dr Apolonio Schneiders  Labs/tests ordered:  UA CBC BMP

## 2018-08-08 DIAGNOSIS — I509 Heart failure, unspecified: Secondary | ICD-10-CM | POA: Diagnosis not present

## 2018-08-08 DIAGNOSIS — G309 Alzheimer's disease, unspecified: Secondary | ICD-10-CM | POA: Diagnosis not present

## 2018-08-08 DIAGNOSIS — I679 Cerebrovascular disease, unspecified: Secondary | ICD-10-CM | POA: Diagnosis not present

## 2018-08-08 DIAGNOSIS — K219 Gastro-esophageal reflux disease without esophagitis: Secondary | ICD-10-CM | POA: Diagnosis not present

## 2018-08-08 DIAGNOSIS — E785 Hyperlipidemia, unspecified: Secondary | ICD-10-CM | POA: Diagnosis not present

## 2018-08-08 DIAGNOSIS — R1310 Dysphagia, unspecified: Secondary | ICD-10-CM | POA: Diagnosis not present

## 2018-08-08 DIAGNOSIS — D649 Anemia, unspecified: Secondary | ICD-10-CM | POA: Diagnosis not present

## 2018-08-08 DIAGNOSIS — M2450 Contracture, unspecified joint: Secondary | ICD-10-CM | POA: Diagnosis not present

## 2018-08-08 DIAGNOSIS — N183 Chronic kidney disease, stage 3 (moderate): Secondary | ICD-10-CM | POA: Diagnosis not present

## 2018-08-08 DIAGNOSIS — N401 Enlarged prostate with lower urinary tract symptoms: Secondary | ICD-10-CM | POA: Diagnosis not present

## 2018-08-08 DIAGNOSIS — I25810 Atherosclerosis of coronary artery bypass graft(s) without angina pectoris: Secondary | ICD-10-CM | POA: Diagnosis not present

## 2018-08-08 DIAGNOSIS — E860 Dehydration: Secondary | ICD-10-CM | POA: Diagnosis not present

## 2018-08-08 DIAGNOSIS — I4891 Unspecified atrial fibrillation: Secondary | ICD-10-CM | POA: Diagnosis not present

## 2018-08-10 DIAGNOSIS — E860 Dehydration: Secondary | ICD-10-CM | POA: Diagnosis not present

## 2018-08-10 DIAGNOSIS — D649 Anemia, unspecified: Secondary | ICD-10-CM | POA: Diagnosis not present

## 2018-08-10 DIAGNOSIS — N179 Acute kidney failure, unspecified: Secondary | ICD-10-CM | POA: Diagnosis not present

## 2018-08-10 DIAGNOSIS — D508 Other iron deficiency anemias: Secondary | ICD-10-CM | POA: Diagnosis not present

## 2018-08-10 LAB — BASIC METABOLIC PANEL
BUN: 42 — AB (ref 4–21)
Creatinine: 2 — AB (ref 0.6–1.3)
Glucose: 100
POTASSIUM: 4.7 (ref 3.4–5.3)
Sodium: 142 (ref 137–147)

## 2018-08-10 LAB — CBC AND DIFFERENTIAL
HCT: 32 — AB (ref 41–53)
Hemoglobin: 10.6 — AB (ref 13.5–17.5)
Platelets: 148 — AB (ref 150–399)
WBC: 5.3

## 2018-08-11 DIAGNOSIS — G309 Alzheimer's disease, unspecified: Secondary | ICD-10-CM | POA: Diagnosis not present

## 2018-08-11 DIAGNOSIS — I509 Heart failure, unspecified: Secondary | ICD-10-CM | POA: Diagnosis not present

## 2018-08-11 DIAGNOSIS — N183 Chronic kidney disease, stage 3 (moderate): Secondary | ICD-10-CM | POA: Diagnosis not present

## 2018-08-11 DIAGNOSIS — I679 Cerebrovascular disease, unspecified: Secondary | ICD-10-CM | POA: Diagnosis not present

## 2018-08-11 DIAGNOSIS — I25810 Atherosclerosis of coronary artery bypass graft(s) without angina pectoris: Secondary | ICD-10-CM | POA: Diagnosis not present

## 2018-08-11 DIAGNOSIS — I4891 Unspecified atrial fibrillation: Secondary | ICD-10-CM | POA: Diagnosis not present

## 2018-08-13 ENCOUNTER — Non-Acute Institutional Stay: Payer: Medicare Other | Admitting: Adult Health

## 2018-08-13 DIAGNOSIS — R1310 Dysphagia, unspecified: Secondary | ICD-10-CM

## 2018-08-13 DIAGNOSIS — D631 Anemia in chronic kidney disease: Secondary | ICD-10-CM | POA: Diagnosis not present

## 2018-08-13 DIAGNOSIS — I5032 Chronic diastolic (congestive) heart failure: Secondary | ICD-10-CM | POA: Diagnosis not present

## 2018-08-13 DIAGNOSIS — J690 Pneumonitis due to inhalation of food and vomit: Secondary | ICD-10-CM

## 2018-08-13 DIAGNOSIS — R339 Retention of urine, unspecified: Secondary | ICD-10-CM | POA: Diagnosis not present

## 2018-08-13 DIAGNOSIS — G301 Alzheimer's disease with late onset: Secondary | ICD-10-CM

## 2018-08-13 DIAGNOSIS — N3000 Acute cystitis without hematuria: Secondary | ICD-10-CM | POA: Diagnosis not present

## 2018-08-13 DIAGNOSIS — E860 Dehydration: Secondary | ICD-10-CM | POA: Diagnosis not present

## 2018-08-13 DIAGNOSIS — N183 Chronic kidney disease, stage 3 unspecified: Secondary | ICD-10-CM

## 2018-08-13 DIAGNOSIS — F0281 Dementia in other diseases classified elsewhere with behavioral disturbance: Secondary | ICD-10-CM | POA: Diagnosis not present

## 2018-08-13 DIAGNOSIS — N179 Acute kidney failure, unspecified: Secondary | ICD-10-CM

## 2018-08-13 DIAGNOSIS — R634 Abnormal weight loss: Secondary | ICD-10-CM | POA: Diagnosis not present

## 2018-08-13 DIAGNOSIS — D649 Anemia, unspecified: Secondary | ICD-10-CM | POA: Diagnosis not present

## 2018-08-13 DIAGNOSIS — F02818 Dementia in other diseases classified elsewhere, unspecified severity, with other behavioral disturbance: Secondary | ICD-10-CM

## 2018-08-13 DIAGNOSIS — D508 Other iron deficiency anemias: Secondary | ICD-10-CM | POA: Diagnosis not present

## 2018-08-13 LAB — BASIC METABOLIC PANEL
BUN: 38 — AB (ref 4–21)
CREATININE: 2.1 — AB (ref 0.6–1.3)
Glucose: 89
POTASSIUM: 4.5 (ref 3.4–5.3)
SODIUM: 140 (ref 137–147)

## 2018-08-13 LAB — CBC AND DIFFERENTIAL
HCT: 33 — AB (ref 41–53)
Hemoglobin: 11.1 — AB (ref 13.5–17.5)
Platelets: 170 (ref 150–399)
WBC: 6.3

## 2018-08-13 LAB — IRON,TIBC AND FERRITIN PANEL: Iron: 60

## 2018-08-13 NOTE — Progress Notes (Signed)
Location:  Occupational psychologist of Service:  ALF (13) Provider:   Cindi Carbon, ANP [place] [phone]   [name], DO  Patient Care Team: [name], DO as PCP - General (Geriatric Medicine) [facility], Margreta Journey, NP as Nurse Practitioner (Nurse Practitioner)  Extended Emergency Contact Information Primary Emergency Contact: [name] Address: [address]          [place], [place] [address] [place] Phone: [phone] Relation: Spouse Secondary Emergency Contact: [name], [place] Phone: [phone] Relation: Daughter  Code Status:  DNR Goals of care: Advanced Directive information Advanced Directives 06/03/2018  Does Patient Have a Medical Advance Directive? Yes  Type of Paramedic of [place];Living will;Out of facility DNR (pink MOST or yellow form)  Does patient want to make changes to medical advance directive? No - Patient declined  Copy of [place] in Chart? Yes - validated most recent copy scanned in chart (See row information)  Pre-existing out of facility DNR order (yellow form or pink MOST form) Yellow form placed in chart (order not valid for inpatient use);Pink MOST form placed in chart (order not valid for inpatient use)     Chief Complaint  Patient presents with  . Medical Management of Chronic Issues    HPI:  Pt is a 85 y.o. male seen today for medical management of chronic diseases. He has a hx of severe AD, CHF, CKD, afib, OA, CVA, venous insuff, BPH with retention, and diastolic CHF.  His BUN/Cr have been rising for reasons that are not clear from a baseline of 1.0 to now 2.02. His catheter is draining well. Noted history of hydronephrosis.  He received 500 cc of IVF due to this finding per his son's request but it did not improve the situation  and then he had some wheezing/edema and so a chest xray was ordered which showed the below findings.  A urine was obtained per the son's request which showed signs of a UTI. He was originally placed on Doxycycline for the possible "pna" by the oncall provider but later it was changed due to the sensitivities on the urine report and his allergy list to [place]. He has not had any fever or purulent sputum. He has periodically need duonebs due to wheezing. His caretaker reports that he is having more difficulty swallowing and "rattles" after eating meals at times.  Due to his advanced dementia he can no contribute to the history.  He also has worsening edema to both feet but not calves. Wt Readings from Last 3 Encounters:  08/13/18 173 lb (78.5 kg)  08/07/18 172 lb 4.8 oz (78.2 kg)  07/24/18 177 lb (80.3 kg)  Weights are down from 1/17 at 172-173 lbs. He has a hx of chronic edema to his feet. The nurses are having trouble getting his compression hose one. We tried compression wraps but these left bruises. The nurses are trying to order hose with a zipper.   Anemia: Hgb was trending downward from a baseline 10.6 on 08/10/18 from 11.2 on 06/07/18, but was in the 13-14 range earlier that same year. Stools are neg for blood x 3. Iron panel WNL  CXR 08/07/2018: mild pulmonary edema pattern more prominent to previous with worsening pna, acute respiratory resp distress syndrome or volume overload.   UA C and S  Klebsiella UTI 100,000 colonies resistant to ampicillin   Past Medical History:  Diagnosis Date  . Allergic rhinitis   . Alzheimer's disease ([place]) 2013  . Atrial fibrillation ([place])    Long term anti coagulation w/ warfarin fro stroke risk reduction  . BPH (benign prostatic hyperplasia)   . Chronic venous insufficiency 01/12/2013  . Dementia with behavioral disturbance ([place]) 12/29/13  . Gait disorder   . GERD (gastroesophageal reflux disease)   . 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 Hyperlipidemia   . Lactose intolerance   . Long term (current) use of anticoagulants 11/23/2012   Long-term anticoagulation with warfarin for stroke risk reduction related to AF   Past Surgical History:  Procedure Laterality Date  . CATARACT EXTRACTION W/ INTRAOCULAR LENS  IMPLANT, BILATERAL    . HERNIA REPAIR Right 1970s  . KNEE SURGERY Right 2000  . MASS EXCISION Left 07/30/2013   Procedure: EXCISION OF BASAL CELL CARCINOMA  LEFT FOREHEAD ;  Surgeon: Irene Limbo, MD;  Location: [facility];  Service: Plastics;  Laterality: Left;  . RETINAL DETACHMENT SURGERY Right 2009    Allergies  Allergen Reactions  . Penicillins Rash  . Bee Venom   . Risperidone And Related     Unstable gait    Outpatient Encounter Medications as of 08/13/2018  Medication Sig  . apixaban (ELIQUIS) 2.5 MG TABS tablet Take 2.5 mg by mouth 2 (two) times daily.  Marland Kitchen ipratropium-albuterol (DUONEB) 0.5-2.5 (3) MG/3ML SOLN Take 3 mLs by nebulization every 6 (six) hours as needed (for wheezing and cough). q 6 hrs prn  . lactose free nutrition (BOOST) LIQD Take 237 mLs by mouth daily.   Marland Kitchen levofloxacin (LEVAQUIN) 250 MG tablet Take 250 mg by mouth daily. 500 mg on day 1, then 250 mg qd x 6 more days  . nitroGLYCERIN (NITROSTAT) 0.4 MG SL tablet Place 0.4 mg under the tongue every 5 (five) minutes as needed for chest pain.  . potassium chloride SA (K-DUR,KLOR-CON) 20 MEQ tablet Take 40 mEq by mouth daily.  Marland Kitchen saccharomyces boulardii (FLORASTOR) 250 MG capsule Take 250 mg by mouth 2 (two) times daily.   No facility-administered encounter medications on file as of 08/13/2018.     Review of Systems  Unable to perform ROS: Dementia    Immunization History  Administered Date(s) Administered  . Influenza Inj Mdck Quad Pf 04/25/2016  . Influenza Whole 04/20/2013  . Influenza,inj,Quad PF,6+ Mos 04/28/2018  . Influenza-Unspecified 04/12/2014, 04/20/2015, 05/01/2017  . PPD Test 11/29/2012  .  Pneumococcal Conjugate-13 08/01/2016  . Pneumococcal Polysaccharide-23 10/07/2017  . Zoster Recombinat (Shingrix) 05/20/2017, 09/30/2017   Pertinent  Health Maintenance Due  Topic Date Due  . INFLUENZA VACCINE  Completed  . PNA vac Low Risk Adult  Completed   Fall Risk  06/03/2018 05/28/2017 06/21/2016  Falls in the past year? 0 No No  Number falls in past yr: 0 - -  Injury with Fall? 0 - -   Functional Status Survey:    Vitals:   08/13/18 1416  Temp: (!) 96.5 F (35.8 C)   There is no height or weight on file to calculate BMI.  Wt Readings from Last 3 Encounters:  08/13/18 173 lb (78.5 kg)  08/07/18 172 lb 4.8 oz (78.2 kg)  07/24/18 177 lb (80.3 kg)   Physical Exam Vitals signs and nursing note reviewed.  Physical Exam Constitutional:      General: He is not in acute distress.  Appearance: He is not diaphoretic.     Comments: frail  HENT:     Head: Normocephalic and atraumatic.     Nose: Rhinorrhea present.     Mouth/Throat:     Mouth: Mucous membranes are moist.     Pharynx: Oropharynx is clear. No oropharyngeal exudate.  Eyes:     General:        Right eye: No discharge.        Left eye: No discharge.     Conjunctiva/sclera: Conjunctivae normal.     Pupils: Pupils are equal, round, and reactive to light.  Neck:     Thyroid: No thyromegaly.     Vascular: No JVD.     Trachea: No tracheal deviation.  Cardiovascular:     Rate and Rhythm: Normal rate. Rhythm irregular.     Heart sounds: No murmur.  Pulmonary:     Effort: Pulmonary effort is normal. No respiratory distress.     Breath sounds: Rhonchi and rales present. No wheezing.     Comments: Wet quality noted in upper airway "rattle" Abdominal:     General: Bowel sounds are normal. There is no distension.     Palpations: Abdomen is soft.     Tenderness: There is no abdominal tenderness.  Musculoskeletal:     Right lower leg: Edema present.     Left lower leg: Edema present.  Lymphadenopathy:      Cervical: No cervical adenopathy.  Skin:    General: Skin is warm and dry.  Neurological:     General: No focal deficit present.     Mental Status: He is alert.     Comments: Non verbal and not able to f/c     Labs reviewed: Recent Labs    08/06/18 08/10/18 08/13/18  NA 146 142 140  K 4.6 4.7 4.5  BUN 43* 42* 38*  CREATININE 2.0* 2.0* 2.1*   Recent Labs    06/07/18  AST 17  ALT 11  ALKPHOS 114   Recent Labs    06/07/18 08/10/18 08/13/18  WBC 13.9 5.3 6.3  NEUTROABS 12  --   --   HGB 11.2* 10.6* 11.1*  HCT 34* 32* 33*  PLT 185 148* 170   Lab Results  Component Value Date   TSH 4.17 11/13/2017   No results found for: HGBA1C Lab Results  Component Value Date   CHOL 155 08/31/2015   HDL 33 (A) 08/31/2015   LDLCALC 99 08/31/2015   TRIG 116 08/31/2015    Significant Diagnostic Results in last 30 days:  No results found.  Assessment/Plan  1. Acute cystitis without hematuria Complete course of levaquin  2. Aspiration pneumonia of both lower lobes, unspecified aspiration pneumonia type ([place]) Complete course of levaquin Schedule Duonebs bid   3. Acute renal failure, unspecified acute renal failure type ([place]) Unclear cause. ? If this is due to worsening hydronephrosis or cardio renal issues. Would not pursue additional work up due to his debility.   4. Dysphagia, unspecified type Progressing per staff with more difficulty swallowing. Continues to aspirate and has an overall poor quality of life. Continue asp prec and current diet. No feeding tubes.   5. Chronic diastolic congestive heart failure (HCC) Edema and lung sounds may suggest slight volume overload. Given his renal failure would not give more lasix. Continue to elevated legs and use compression hose to facilitate fluid removal.   6. Urinary retention Due to BPH with cath flowing well  7. Late onset Alzheimer's disease  with behavioral disturbance ([place]) Nearing end stage and followed by hospice. His  family continues to want reversible issues treated in the facility but would like to avoid hospitalizations.   8. Anemia Likely due to worsening renal failure.   Family/ staff Communication: discussed with nurse  Labs/tests ordered:  BMP ordered

## 2018-08-14 ENCOUNTER — Encounter: Payer: Self-pay | Admitting: Adult Health

## 2018-08-14 DIAGNOSIS — D649 Anemia, unspecified: Secondary | ICD-10-CM | POA: Insufficient documentation

## 2018-08-18 DIAGNOSIS — I4891 Unspecified atrial fibrillation: Secondary | ICD-10-CM | POA: Diagnosis not present

## 2018-08-18 DIAGNOSIS — I679 Cerebrovascular disease, unspecified: Secondary | ICD-10-CM | POA: Diagnosis not present

## 2018-08-18 DIAGNOSIS — I25810 Atherosclerosis of coronary artery bypass graft(s) without angina pectoris: Secondary | ICD-10-CM | POA: Diagnosis not present

## 2018-08-18 DIAGNOSIS — I509 Heart failure, unspecified: Secondary | ICD-10-CM | POA: Diagnosis not present

## 2018-08-18 DIAGNOSIS — G309 Alzheimer's disease, unspecified: Secondary | ICD-10-CM | POA: Diagnosis not present

## 2018-08-18 DIAGNOSIS — N183 Chronic kidney disease, stage 3 (moderate): Secondary | ICD-10-CM | POA: Diagnosis not present

## 2018-08-20 DIAGNOSIS — D649 Anemia, unspecified: Secondary | ICD-10-CM | POA: Diagnosis not present

## 2018-08-20 DIAGNOSIS — N183 Chronic kidney disease, stage 3 (moderate): Secondary | ICD-10-CM | POA: Diagnosis not present

## 2018-08-25 DIAGNOSIS — I509 Heart failure, unspecified: Secondary | ICD-10-CM | POA: Diagnosis not present

## 2018-08-25 DIAGNOSIS — I679 Cerebrovascular disease, unspecified: Secondary | ICD-10-CM | POA: Diagnosis not present

## 2018-08-25 DIAGNOSIS — G309 Alzheimer's disease, unspecified: Secondary | ICD-10-CM | POA: Diagnosis not present

## 2018-08-25 DIAGNOSIS — N183 Chronic kidney disease, stage 3 (moderate): Secondary | ICD-10-CM | POA: Diagnosis not present

## 2018-08-25 DIAGNOSIS — I25810 Atherosclerosis of coronary artery bypass graft(s) without angina pectoris: Secondary | ICD-10-CM | POA: Diagnosis not present

## 2018-08-25 DIAGNOSIS — I4891 Unspecified atrial fibrillation: Secondary | ICD-10-CM | POA: Diagnosis not present

## 2018-08-27 DIAGNOSIS — N183 Chronic kidney disease, stage 3 (moderate): Secondary | ICD-10-CM | POA: Diagnosis not present

## 2018-08-27 DIAGNOSIS — I509 Heart failure, unspecified: Secondary | ICD-10-CM | POA: Diagnosis not present

## 2018-08-27 DIAGNOSIS — G309 Alzheimer's disease, unspecified: Secondary | ICD-10-CM | POA: Diagnosis not present

## 2018-08-27 DIAGNOSIS — I679 Cerebrovascular disease, unspecified: Secondary | ICD-10-CM | POA: Diagnosis not present

## 2018-08-27 DIAGNOSIS — I25810 Atherosclerosis of coronary artery bypass graft(s) without angina pectoris: Secondary | ICD-10-CM | POA: Diagnosis not present

## 2018-08-27 DIAGNOSIS — I4891 Unspecified atrial fibrillation: Secondary | ICD-10-CM | POA: Diagnosis not present

## 2018-09-01 DIAGNOSIS — I509 Heart failure, unspecified: Secondary | ICD-10-CM | POA: Diagnosis not present

## 2018-09-01 DIAGNOSIS — I4891 Unspecified atrial fibrillation: Secondary | ICD-10-CM | POA: Diagnosis not present

## 2018-09-01 DIAGNOSIS — G309 Alzheimer's disease, unspecified: Secondary | ICD-10-CM | POA: Diagnosis not present

## 2018-09-01 DIAGNOSIS — N183 Chronic kidney disease, stage 3 (moderate): Secondary | ICD-10-CM | POA: Diagnosis not present

## 2018-09-01 DIAGNOSIS — I25810 Atherosclerosis of coronary artery bypass graft(s) without angina pectoris: Secondary | ICD-10-CM | POA: Diagnosis not present

## 2018-09-01 DIAGNOSIS — I679 Cerebrovascular disease, unspecified: Secondary | ICD-10-CM | POA: Diagnosis not present

## 2018-09-03 DIAGNOSIS — N183 Chronic kidney disease, stage 3 (moderate): Secondary | ICD-10-CM | POA: Diagnosis not present

## 2018-09-03 DIAGNOSIS — D649 Anemia, unspecified: Secondary | ICD-10-CM | POA: Diagnosis not present

## 2018-09-04 DIAGNOSIS — I509 Heart failure, unspecified: Secondary | ICD-10-CM | POA: Diagnosis not present

## 2018-09-04 DIAGNOSIS — G309 Alzheimer's disease, unspecified: Secondary | ICD-10-CM | POA: Diagnosis not present

## 2018-09-04 DIAGNOSIS — I679 Cerebrovascular disease, unspecified: Secondary | ICD-10-CM | POA: Diagnosis not present

## 2018-09-04 DIAGNOSIS — N183 Chronic kidney disease, stage 3 (moderate): Secondary | ICD-10-CM | POA: Diagnosis not present

## 2018-09-04 DIAGNOSIS — I4891 Unspecified atrial fibrillation: Secondary | ICD-10-CM | POA: Diagnosis not present

## 2018-09-04 DIAGNOSIS — I25810 Atherosclerosis of coronary artery bypass graft(s) without angina pectoris: Secondary | ICD-10-CM | POA: Diagnosis not present

## 2018-09-06 DIAGNOSIS — I4891 Unspecified atrial fibrillation: Secondary | ICD-10-CM | POA: Diagnosis not present

## 2018-09-06 DIAGNOSIS — R1310 Dysphagia, unspecified: Secondary | ICD-10-CM | POA: Diagnosis not present

## 2018-09-06 DIAGNOSIS — I679 Cerebrovascular disease, unspecified: Secondary | ICD-10-CM | POA: Diagnosis not present

## 2018-09-06 DIAGNOSIS — I25810 Atherosclerosis of coronary artery bypass graft(s) without angina pectoris: Secondary | ICD-10-CM | POA: Diagnosis not present

## 2018-09-06 DIAGNOSIS — N401 Enlarged prostate with lower urinary tract symptoms: Secondary | ICD-10-CM | POA: Diagnosis not present

## 2018-09-06 DIAGNOSIS — K219 Gastro-esophageal reflux disease without esophagitis: Secondary | ICD-10-CM | POA: Diagnosis not present

## 2018-09-06 DIAGNOSIS — E785 Hyperlipidemia, unspecified: Secondary | ICD-10-CM | POA: Diagnosis not present

## 2018-09-06 DIAGNOSIS — N183 Chronic kidney disease, stage 3 (moderate): Secondary | ICD-10-CM | POA: Diagnosis not present

## 2018-09-06 DIAGNOSIS — M2450 Contracture, unspecified joint: Secondary | ICD-10-CM | POA: Diagnosis not present

## 2018-09-06 DIAGNOSIS — G309 Alzheimer's disease, unspecified: Secondary | ICD-10-CM | POA: Diagnosis not present

## 2018-09-06 DIAGNOSIS — I509 Heart failure, unspecified: Secondary | ICD-10-CM | POA: Diagnosis not present

## 2018-09-09 DIAGNOSIS — G309 Alzheimer's disease, unspecified: Secondary | ICD-10-CM | POA: Diagnosis not present

## 2018-09-09 DIAGNOSIS — N183 Chronic kidney disease, stage 3 (moderate): Secondary | ICD-10-CM | POA: Diagnosis not present

## 2018-09-09 DIAGNOSIS — I509 Heart failure, unspecified: Secondary | ICD-10-CM | POA: Diagnosis not present

## 2018-09-09 DIAGNOSIS — I679 Cerebrovascular disease, unspecified: Secondary | ICD-10-CM | POA: Diagnosis not present

## 2018-09-09 DIAGNOSIS — I4891 Unspecified atrial fibrillation: Secondary | ICD-10-CM | POA: Diagnosis not present

## 2018-09-09 DIAGNOSIS — I25810 Atherosclerosis of coronary artery bypass graft(s) without angina pectoris: Secondary | ICD-10-CM | POA: Diagnosis not present

## 2018-09-10 ENCOUNTER — Non-Acute Institutional Stay: Payer: Medicare Other | Admitting: Adult Health

## 2018-09-10 ENCOUNTER — Encounter: Payer: Self-pay | Admitting: Adult Health

## 2018-09-10 DIAGNOSIS — G301 Alzheimer's disease with late onset: Secondary | ICD-10-CM | POA: Diagnosis not present

## 2018-09-10 DIAGNOSIS — N184 Chronic kidney disease, stage 4 (severe): Secondary | ICD-10-CM | POA: Diagnosis not present

## 2018-09-10 DIAGNOSIS — I5031 Acute diastolic (congestive) heart failure: Secondary | ICD-10-CM | POA: Diagnosis not present

## 2018-09-10 DIAGNOSIS — F0281 Dementia in other diseases classified elsewhere with behavioral disturbance: Secondary | ICD-10-CM

## 2018-09-10 DIAGNOSIS — F02818 Dementia in other diseases classified elsewhere, unspecified severity, with other behavioral disturbance: Secondary | ICD-10-CM

## 2018-09-10 NOTE — Progress Notes (Signed)
Location:  Occupational psychologist of Service:  ALF (13) Provider:   Cindi Carbon, ANP [place] [phone]   [name], DO  Patient Care Team: [name], DO as PCP - General (Geriatric Medicine) Community, Well Angelique Holm, Margreta Journey, NP as Nurse Practitioner (Nurse Practitioner)  Extended Emergency Contact Information Primary Emergency Contact: [name] Address: [address]          [place], [place] [address] [place] Phone: [phone] Relation: Spouse Secondary Emergency Contact: [name], [place] Phone: [phone] Relation: Daughter  Code Status:  DNR Goals of care: Advanced Directive information Advanced Directives 06/03/2018  Does Patient Have a Medical Advance Directive? Yes  Type of Paramedic of [place];Living will;Out of facility DNR (pink MOST or yellow form)  Does patient want to make changes to medical advance directive? No - Patient declined  Copy of [place] in Chart? Yes - validated most recent copy scanned in chart (See row information)  Pre-existing out of facility DNR order (yellow form or pink MOST form) Yellow form placed in chart (order not valid for inpatient use);Pink MOST form placed in chart (order not valid for inpatient use)     Chief Complaint  Patient presents with  . Acute Visit    sob    HPI:  Pt is a 85 y.o. male seen today for an acute visit for shortness of breath. He has a hx of AD, dysphagia, chronic venous insuff, CHF, CKD, afib, CVA, BPH with urinary retention, abd distention with periodic need for a rectal tube, among others. He is followed by hospice. The nurse reports that he was pocketing food today but did drink well. He has been experiencing wheezing for the past week with a furrowed brow and appears uncomfortable. He is more short of  breath in the lying position. No change in chronic cough, no sputum production,  No fever, etc.  His weight has had an increase and there is more edema in his feet. Wt Readings from Last 3 Encounters:  09/10/18 178 lb 1.6 oz (80.8 kg)  08/13/18 173 lb (78.5 kg)  08/07/18 172 lb 4.8 oz (78.2 kg)  The nurse also reports that his eyes rolled back in his head today and he appeared very pale, no shaking was present. He came back to a more alert state. He is non verbal so we are not able to ask him questions.   I called his son and spoke with him regarding his change in condition and asked for an order for morphine for comfort care but he declined. He wanted to continue to treat acute illnesses.    CR worsening over time now at 2.1.    Past Medical History:  Diagnosis Date  . Allergic rhinitis   . Alzheimer's disease ([place]) 2013  . Atrial fibrillation ([place])    Long term anti coagulation w/ warfarin fro stroke risk reduction  . BPH (benign prostatic hyperplasia)   . Chronic venous insufficiency 01/12/2013  . Dementia with behavioral disturbance ([place]) 12/29/13  . Gait disorder   . GERD (gastroesophageal reflux disease)   . History of CVA (cerebrovascular accident) 2001   Rt.MCA  . Hyperlipidemia   . Lactose intolerance   . Long term (current) use of anticoagulants 11/23/2012   Long-term anticoagulation with warfarin for stroke risk reduction related to AF  Past Surgical History:  Procedure Laterality Date  . CATARACT EXTRACTION W/ INTRAOCULAR LENS  IMPLANT, BILATERAL    . HERNIA REPAIR Right 1970s  . KNEE SURGERY Right 2000  . MASS EXCISION Left 07/30/2013   Procedure: EXCISION OF BASAL CELL CARCINOMA  LEFT FOREHEAD ;  Surgeon: Irene Limbo, MD;  Location: [facility];  Service: Plastics;  Laterality: Left;  . RETINAL DETACHMENT SURGERY Right 2009    Allergies  Allergen Reactions  . Penicillins Rash  . Bee Venom   . Risperidone And Related     Unstable gait     Outpatient Encounter Medications as of 09/10/2018  Medication Sig  . apixaban (ELIQUIS) 2.5 MG TABS tablet Take 2.5 mg by mouth 2 (two) times daily.  Marland Kitchen ipratropium-albuterol (DUONEB) 0.5-2.5 (3) MG/3ML SOLN Take 3 mLs by nebulization every 6 (six) hours as needed (for wheezing and cough). q 6 hrs prn  . lactose free nutrition (BOOST) LIQD Take 237 mLs by mouth daily.   . nitroGLYCERIN (NITROSTAT) 0.4 MG SL tablet Place 0.4 mg under the tongue every 5 (five) minutes as needed for chest pain.  . potassium chloride SA (K-DUR,KLOR-CON) 20 MEQ tablet Take 40 mEq by mouth daily.  . [DISCONTINUED] levofloxacin (LEVAQUIN) 250 MG tablet Take 250 mg by mouth daily. 500 mg on day 1, then 250 mg qd x 6 more days  . [DISCONTINUED] saccharomyces boulardii (FLORASTOR) 250 MG capsule Take 250 mg by mouth 2 (two) times daily.   No facility-administered encounter medications on file as of 09/10/2018.     Review of Systems  Unable to perform ROS: Dementia    Immunization History  Administered Date(s) Administered  . Influenza Inj Mdck Quad Pf 04/25/2016  . Influenza Whole 04/20/2013  . Influenza,inj,Quad PF,6+ Mos 04/28/2018  . Influenza-Unspecified 04/12/2014, 04/20/2015, 05/01/2017  . PPD Test 11/29/2012  . Pneumococcal Conjugate-13 08/01/2016  . Pneumococcal Polysaccharide-23 10/07/2017  . Zoster Recombinat (Shingrix) 05/20/2017, 09/30/2017   Pertinent  Health Maintenance Due  Topic Date Due  . INFLUENZA VACCINE  Completed  . PNA vac Low Risk Adult  Completed   Fall Risk  06/03/2018 05/28/2017 06/21/2016  Falls in the past year? 0 No No  Number falls in past yr: 0 - -  Injury with Fall? 0 - -   Functional Status Survey:    Vitals:   09/10/18 1634  BP: 128/70  Pulse: 79  Resp: (!) 24  Temp: (!) 96.2 F (35.7 C)  SpO2: 98%  Weight: 178 lb 1.6 oz (80.8 kg)   Body mass index is 24.15 kg/m. Physical Exam Vitals signs and nursing note reviewed.  Constitutional:      Appearance: He  is ill-appearing.  HENT:     Head: Normocephalic and atraumatic.     Nose: Nose normal. No congestion.     Mouth/Throat:     Mouth: Mucous membranes are moist.     Pharynx: Oropharynx is clear. No oropharyngeal exudate.  Eyes:     Conjunctiva/sclera: Conjunctivae normal.     Pupils: Pupils are equal, round, and reactive to light.  Cardiovascular:     Rate and Rhythm: Normal rate. Rhythm irregular.  Pulmonary:     Breath sounds: Wheezing and rhonchi present.     Comments: Slight increased wob Abdominal:     General: Bowel sounds are normal. There is distension.     Palpations: Abdomen is soft.  Musculoskeletal:     Right lower leg: Edema (+3) present.     Left lower  leg: Edema (+3) present.  Lymphadenopathy:     Cervical: No cervical adenopathy.  Skin:    General: Skin is warm and dry.  Neurological:     General: No focal deficit present.     Comments: Alert with furrowed brow, appears uncomfortable. Not able to f/c     Labs reviewed: Recent Labs    08/06/18 08/10/18 08/13/18  NA 146 142 140  K 4.6 4.7 4.5  BUN 43* 42* 38*  CREATININE 2.0* 2.0* 2.1*   Recent Labs    06/07/18  AST 17  ALT 11  ALKPHOS 114   Recent Labs    06/07/18 08/10/18 08/13/18  WBC 13.9 5.3 6.3  NEUTROABS 12  --   --   HGB 11.2* 10.6* 11.1*  HCT 34* 32* 33*  PLT 185 148* 170   Lab Results  Component Value Date   TSH 4.17 11/13/2017   No results found for: HGBA1C Lab Results  Component Value Date   CHOL 155 08/31/2015   HDL 33 (A) 08/31/2015   LDLCALC 99 08/31/2015   TRIG 116 08/31/2015    Significant Diagnostic Results in last 30 days:  No results found.  Assessment/Plan 1. Acute diastolic CHF (congestive heart failure) (HCC) Give lasix 40 mg IM x 1 dose Monitor weight daily Apply rectal tube to help with abd distention from gas Chest percussion per family request Duonebs q 6 hrs scheduled.  If his conditions worsens or does not improve we will notify his son Dr. Cheryln Manly  and request again for orders for morphine for comfort.  He has a DNR. His family does not want him sent to the hospital unless there is some circumstance where it would be needed for comfort reasons.   2. Late onset Alzheimer's disease with behavioral disturbance ([place]) End stage with food pocketing, followed by hospice.   3. CKD (chronic kidney disease) stage 4, GFR 15-29 ml/min (HCC) Severe and worsening with unclear etiology ?cardiorenal syndrome, also has a hx of hydronephrosis. NO additional work up has been indicated due to his debility    Pharmacist, hospital Communication: discussed with his son Dr. Cheryln Manly  Labs/tests ordered:  BMP bi monthly

## 2018-09-11 DIAGNOSIS — I509 Heart failure, unspecified: Secondary | ICD-10-CM | POA: Diagnosis not present

## 2018-09-11 DIAGNOSIS — I4891 Unspecified atrial fibrillation: Secondary | ICD-10-CM | POA: Diagnosis not present

## 2018-09-11 DIAGNOSIS — I25810 Atherosclerosis of coronary artery bypass graft(s) without angina pectoris: Secondary | ICD-10-CM | POA: Diagnosis not present

## 2018-09-11 DIAGNOSIS — I679 Cerebrovascular disease, unspecified: Secondary | ICD-10-CM | POA: Diagnosis not present

## 2018-09-11 DIAGNOSIS — N183 Chronic kidney disease, stage 3 (moderate): Secondary | ICD-10-CM | POA: Diagnosis not present

## 2018-09-11 DIAGNOSIS — G309 Alzheimer's disease, unspecified: Secondary | ICD-10-CM | POA: Diagnosis not present

## 2018-09-16 DIAGNOSIS — N183 Chronic kidney disease, stage 3 (moderate): Secondary | ICD-10-CM | POA: Diagnosis not present

## 2018-09-16 DIAGNOSIS — G309 Alzheimer's disease, unspecified: Secondary | ICD-10-CM | POA: Diagnosis not present

## 2018-09-16 DIAGNOSIS — I509 Heart failure, unspecified: Secondary | ICD-10-CM | POA: Diagnosis not present

## 2018-09-16 DIAGNOSIS — I679 Cerebrovascular disease, unspecified: Secondary | ICD-10-CM | POA: Diagnosis not present

## 2018-09-16 DIAGNOSIS — I4891 Unspecified atrial fibrillation: Secondary | ICD-10-CM | POA: Diagnosis not present

## 2018-09-16 DIAGNOSIS — I25810 Atherosclerosis of coronary artery bypass graft(s) without angina pectoris: Secondary | ICD-10-CM | POA: Diagnosis not present

## 2018-09-17 ENCOUNTER — Non-Acute Institutional Stay: Payer: Medicare Other | Admitting: Adult Health

## 2018-09-17 DIAGNOSIS — F0281 Dementia in other diseases classified elsewhere with behavioral disturbance: Secondary | ICD-10-CM | POA: Diagnosis not present

## 2018-09-17 DIAGNOSIS — R1312 Dysphagia, oropharyngeal phase: Secondary | ICD-10-CM | POA: Diagnosis not present

## 2018-09-17 DIAGNOSIS — D649 Anemia, unspecified: Secondary | ICD-10-CM | POA: Diagnosis not present

## 2018-09-17 DIAGNOSIS — F02818 Dementia in other diseases classified elsewhere, unspecified severity, with other behavioral disturbance: Secondary | ICD-10-CM

## 2018-09-17 DIAGNOSIS — R0602 Shortness of breath: Secondary | ICD-10-CM | POA: Diagnosis not present

## 2018-09-17 DIAGNOSIS — E860 Dehydration: Secondary | ICD-10-CM | POA: Diagnosis not present

## 2018-09-17 DIAGNOSIS — M25561 Pain in right knee: Secondary | ICD-10-CM | POA: Diagnosis not present

## 2018-09-17 DIAGNOSIS — G301 Alzheimer's disease with late onset: Secondary | ICD-10-CM | POA: Diagnosis not present

## 2018-09-17 DIAGNOSIS — N183 Chronic kidney disease, stage 3 (moderate): Secondary | ICD-10-CM | POA: Diagnosis not present

## 2018-09-17 NOTE — Progress Notes (Addendum)
Location:  Occupational psychologist of Service:  ALF (13) Provider:   Cindi Carbon, ANP [place] [phone]   [name], DO  Patient Care Team: [name], DO as PCP - General (Geriatric Medicine) Community, Well Angelique Holm, Margreta Journey, NP as Nurse Practitioner (Nurse Practitioner)  Extended Emergency Contact Information Primary Emergency Contact: [name] Address: [address]          [place], [place] [address] [place] Phone: [phone] Relation: Spouse Secondary Emergency Contact: [name], [place] Phone: [phone] Relation: Daughter  Code Status:  DNR Goals of care: Advanced Directive information Advanced Directives 06/03/2018  Does Patient Have a Medical Advance Directive? Yes  Type of Paramedic of [place];Living will;Out of facility DNR (pink MOST or yellow form)  Does patient want to make changes to medical advance directive? No - Patient declined  Copy of [place] in Chart? Yes - validated most recent copy scanned in chart (See row information)  Pre-existing out of facility DNR order (yellow form or pink MOST form) Yellow form placed in chart (order not valid for inpatient use);Pink MOST form placed in chart (order not valid for inpatient use)     Chief Complaint  Patient presents with  . Acute Visit    f/u dyspnea and weight loss    HPI:  Pt is a 85 y.o. male seen today for an acute visit for dyspnea and weight loss. He has a hx of severe dementia and has been in declining health due to dysphagia with recurrent infections, urinary retention due to BPH with recurrent UTI, weight loss, contracture, CHF exac and etc.  He received one dose of 40 mg of lasix due to sob and weight gain and lost 16 lbs due to continued poor intake.  Wt Readings from Last 3 Encounters:   09/17/18 162 lb (73.5 kg)  09/10/18 178 lb 1.6 oz (80.8 kg)  08/13/18 173 lb (78.5 kg)   He is experiencing gurgling, increased work of breathing, and lethargy. Labs were drawn and his sodium returned at 153, BUN 67.8, Cr 3.33. NO fever Sats WNL on 2 liters of oxygen. Staff has administered 2.5 mg of morphine for sob with small benefit noted. Dose increased to 5mg  with improvement noted in sob.   Past Medical History:  Diagnosis Date  . Allergic rhinitis   . Alzheimer's disease ([place]) 2013  . Atrial fibrillation ([facility])    Long term anti coagulation w/ warfarin fro stroke risk reduction  . BPH (benign prostatic hyperplasia)   . Chronic venous insufficiency 01/12/2013  . Dementia with behavioral disturbance ([place]) 12/29/13  . Gait disorder   . GERD (gastroesophageal reflux disease)   . History of CVA (cerebrovascular accident) 2001   Rt.MCA  . Hyperlipidemia   . Lactose intolerance   . Long term (current) use of anticoagulants 11/23/2012   Long-term anticoagulation with warfarin for stroke risk reduction related to AF   Past Surgical History:  Procedure Laterality Date  . CATARACT EXTRACTION W/ INTRAOCULAR LENS  IMPLANT, BILATERAL    . HERNIA REPAIR Right 1970s  . KNEE SURGERY Right 2000  . MASS EXCISION Left 07/30/2013   Procedure: EXCISION OF BASAL CELL CARCINOMA  LEFT FOREHEAD ;  Surgeon: Irene Limbo, MD;  Location: [facility];  Service: Plastics;  Laterality: Left;  . RETINAL DETACHMENT  SURGERY Right 2009    Allergies  Allergen Reactions  . Penicillins Rash  . Bee Venom   . Risperidone And Related     Unstable gait    Outpatient Encounter Medications as of 09/17/2018  Medication Sig  . ipratropium-albuterol (DUONEB) 0.5-2.5 (3) MG/3ML SOLN Take 3 mLs by nebulization every 6 (six) hours. q 6 hrs prn  . morphine (ROXANOL) 20 MG/ML concentrated solution Take 5 mg by mouth every 4 (four) hours as needed for severe pain.  Marland Kitchen acetaminophen (TYLENOL) 325 MG tablet  Take 650 mg by mouth 3 (three) times daily.  Marland Kitchen apixaban (ELIQUIS) 2.5 MG TABS tablet Take 2.5 mg by mouth 2 (two) times daily.  Marland Kitchen lactose free nutrition (BOOST) LIQD Take 237 mLs by mouth daily.   . nitroGLYCERIN (NITROSTAT) 0.4 MG SL tablet Place 0.4 mg under the tongue every 5 (five) minutes as needed for chest pain.  . potassium chloride SA (K-DUR,KLOR-CON) 20 MEQ tablet Take 40 mEq by mouth daily.   No facility-administered encounter medications on file as of 09/17/2018.     Review of Systems  Unable to perform ROS: Dementia    Immunization History  Administered Date(s) Administered  . Influenza Inj Mdck Quad Pf 04/25/2016  . Influenza Whole 04/20/2013  . Influenza,inj,Quad PF,6+ Mos 04/28/2018  . Influenza-Unspecified 04/12/2014, 04/20/2015, 05/01/2017  . PPD Test 11/29/2012  . Pneumococcal Conjugate-13 08/01/2016  . Pneumococcal Polysaccharide-23 10/07/2017  . Zoster Recombinat (Shingrix) 05/20/2017, 09/30/2017   Pertinent  Health Maintenance Due  Topic Date Due  . INFLUENZA VACCINE  Completed  . PNA vac Low Risk Adult  Completed   Fall Risk  06/03/2018 05/28/2017 06/21/2016  Falls in the past year? 0 No No  Number falls in past yr: 0 - -  Injury with Fall? 0 - -   Functional Status Survey:    Vitals:   09/17/18 1549  BP: (!) 105/59  Pulse: 95  Resp: 18  Temp: 98.1 F (36.7 C)  SpO2: 99%  Weight: 162 lb (73.5 kg)   Body mass index is 21.97 kg/m. Physical Exam Vitals signs and nursing note reviewed.  Constitutional:      General: He is not in acute distress.    Appearance: He is not diaphoretic.  HENT:     Head: Normocephalic and atraumatic.     Mouth/Throat:     Mouth: Mucous membranes are dry.  Neck:     Thyroid: No thyromegaly.     Vascular: No JVD.     Trachea: No tracheal deviation.  Cardiovascular:     Rate and Rhythm: Normal rate. Rhythm irregular.     Heart sounds: No murmur.  Pulmonary:     Effort: Respiratory distress present.      Breath sounds: No stridor. Wheezing, rhonchi and rales present.  Abdominal:     General: Bowel sounds are normal. There is no distension.     Palpations: Abdomen is soft.     Tenderness: There is no abdominal tenderness.  Lymphadenopathy:     Cervical: No cervical adenopathy.  Skin:    General: Skin is warm and dry.  Neurological:     Mental Status: He is alert.     Comments: Not verbal and not able to f/c     Labs reviewed: Recent Labs    08/06/18 08/10/18 08/13/18  NA 146 142 140  K 4.6 4.7 4.5  BUN 43* 42* 38*  CREATININE 2.0* 2.0* 2.1*   Recent Labs    06/07/18  AST  17  ALT 11  ALKPHOS 114   Recent Labs    06/07/18 08/10/18 08/13/18  WBC 13.9 5.3 6.3  NEUTROABS 12  --   --   HGB 11.2* 10.6* 11.1*  HCT 34* 32* 33*  PLT 185 148* 170   Lab Results  Component Value Date   TSH 4.17 11/13/2017   No results found for: HGBA1C Lab Results  Component Value Date   CHOL 155 08/31/2015   HDL 33 (A) 08/31/2015   LDLCALC 99 08/31/2015   TRIG 116 08/31/2015    Significant Diagnostic Results in last 30 days:  No results found.  Assessment/Plan 1. Dehydration Nurse will attempt to obtain an IV and give D5W at 70 cc/hr x 1 liter over 16 hrs per family request.  If any worsening shortness of breath develops the IV will be discontinued.   2. Shortness of breath Improved with roxanol use at 5 mg q 4 hrs prn Likely due to worsening dysphagia ?aspiration injury   3. Acute pain of right knee With mild swelling Has roxanol as needed for pain and scheduled tylenol Likely this is arthritic related given he has a hx of pain there and has had knee replacement in the past  4. Oropharyngeal dysphagia Worsening with minimal p.o. intake Only allow puree diet with HTL if resident is not short of breath, is alert, and appears to want to eat (comfort feeds)  5. Late onset Alzheimer's disease with behavioral disturbance ([place]) At the end of life see note below  6. Advanced care  planning/discussion  Mr. Basista is unfortunately at the end of life. He has a hx of severe dementia, dysphagia, urinary retention, CHF, urinary retention, prostate nodule, renal failure, hydronephrosis, contracted limbs, etc.  He is experiencing gurgling and shortness of breath periodically. He is on roxanol for pain and shortness of breath which seems to help. His Cr. Has increased to 3.33 with a sodium of 153. In the past he has responded well to fluid but this time I recommended against it given he has worsening dysphagia, resp symptoms and worsening renal failure. I spoke with his daughter and she is on board with this plan of care. She spoke with her brother, Dr. Apolonio Schneiders, and he would like [place] to try one last time to give his dad some IVF to see if he "responds".  He expressed that his mother, the wife of the resident, is devastated and is not dealing with the loss. She is experiencing worsening dementia in the past few weeks. We agreed to try 1 liter of fluid D5W if we can find a vein. If he develops any shortness of breath we will stop the IV.  If he does not respond we will remove all meds, labs, monitoring etc and focus on comfort care only.  I spent 50 min in counseling and coordination regarding end of life issues.    Family/ staff Communication: discussed with his daughter Butch Penny, and son Dr Apolonio Schneiders  Labs/tests ordered:  NA

## 2018-09-18 ENCOUNTER — Encounter: Payer: Self-pay | Admitting: Adult Health

## 2018-09-18 ENCOUNTER — Non-Acute Institutional Stay: Payer: Medicare Other | Admitting: Adult Health

## 2018-09-18 DIAGNOSIS — F0281 Dementia in other diseases classified elsewhere with behavioral disturbance: Secondary | ICD-10-CM | POA: Diagnosis not present

## 2018-09-18 DIAGNOSIS — G301 Alzheimer's disease with late onset: Secondary | ICD-10-CM | POA: Diagnosis not present

## 2018-09-18 DIAGNOSIS — I4891 Unspecified atrial fibrillation: Secondary | ICD-10-CM | POA: Diagnosis not present

## 2018-09-18 DIAGNOSIS — N183 Chronic kidney disease, stage 3 (moderate): Secondary | ICD-10-CM | POA: Diagnosis not present

## 2018-09-18 DIAGNOSIS — K5901 Slow transit constipation: Secondary | ICD-10-CM | POA: Diagnosis not present

## 2018-09-18 DIAGNOSIS — R634 Abnormal weight loss: Secondary | ICD-10-CM

## 2018-09-18 DIAGNOSIS — R0602 Shortness of breath: Secondary | ICD-10-CM

## 2018-09-18 DIAGNOSIS — E860 Dehydration: Secondary | ICD-10-CM | POA: Diagnosis not present

## 2018-09-18 DIAGNOSIS — I509 Heart failure, unspecified: Secondary | ICD-10-CM | POA: Diagnosis not present

## 2018-09-18 DIAGNOSIS — I25810 Atherosclerosis of coronary artery bypass graft(s) without angina pectoris: Secondary | ICD-10-CM | POA: Diagnosis not present

## 2018-09-18 DIAGNOSIS — I679 Cerebrovascular disease, unspecified: Secondary | ICD-10-CM | POA: Diagnosis not present

## 2018-09-18 DIAGNOSIS — G309 Alzheimer's disease, unspecified: Secondary | ICD-10-CM | POA: Diagnosis not present

## 2018-09-18 LAB — BASIC METABOLIC PANEL
BUN: 68 — AB (ref 4–21)
Creatinine: 3.3 — AB (ref 0.6–1.3)
Potassium: 4 (ref 3.4–5.3)
Sodium: 153 — AB (ref 137–147)

## 2018-09-18 MED ORDER — LORAZEPAM 2 MG/ML PO CONC: 0.5000 mg | ORAL | 1 refills | Status: AC | PRN

## 2018-09-18 MED ORDER — MORPHINE SULFATE (CONCENTRATE) 20 MG/ML PO SOLN: 5.0000 mg | ORAL | 0 refills | Status: AC | PRN

## 2018-09-18 NOTE — Progress Notes (Signed)
Location:  Occupational psychologist of Service:  ALF (13) Provider:  Cindi Carbon, ANP [place] [phone]   [name], DO  Patient Care Team: [name], DO as PCP - General (Geriatric Medicine) [facility]m, Margreta Journey, NP as Nurse Practitioner (Nurse Practitioner)  Extended Emergency Contact Information Primary Emergency Contact: [name] Address: [address]          [place], [place] [address] [place] Phone: [phone] Relation: Spouse Secondary Emergency Contact: [name], [place] Phone: [phone] Relation: Daughter  Code Status:  DNR Goals of care: Advanced Directive information Advanced Directives 06/03/2018  Does Patient Have a Medical Advance Directive? Yes  Type of Paramedic of [place];Living will;Out of facility DNR (pink MOST or yellow form)  Does patient want to make changes to medical advance directive? No - Patient declined  Copy of [place] in Chart? Yes - validated most recent copy scanned in chart (See row information)  Pre-existing out of facility DNR order (yellow form or pink MOST form) Yellow form placed in chart (order not valid for inpatient use);Pink MOST form placed in chart (order not valid for inpatient use)     Chief Complaint  Patient presents with  . Acute Visit    f/u dyspnea    HPI:  Pt is a 85 y.o. male seen today for an acute visit for follow up regarding dyspnea.   He has a hx of severe dementia and has been in declining health due to dysphagia with recurrent infections, urinary retention due to BPH with recurrent UTI, weight loss, contracture, CHF exac and etc.  He received one dose of 40 mg of lasix due to sob and weight gain and lost 16 lbs due to continued poor intake in the past week.   09/18/18 He was given 1 liter of  D5W per his family's request. He did not respond and is now more lethargic. He has poor oral intake. He continues with increased wob and "rattling" sounds.   Past Medical History:  Diagnosis Date  . Allergic rhinitis   . Alzheimer's disease ([facility]) 2013  . Atrial fibrillation ([place])    Long term anti coagulation w/ warfarin fro stroke risk reduction  . BPH (benign prostatic hyperplasia)   . Chronic venous insufficiency 01/12/2013  . Dementia with behavioral disturbance ([place]) 12/29/13  . Gait disorder   . GERD (gastroesophageal reflux disease)   . History of CVA (cerebrovascular accident) 2001   Rt.MCA  . Hyperlipidemia   . Lactose intolerance   . Long term (current) use of anticoagulants 11/23/2012   Long-term anticoagulation with warfarin for stroke risk reduction related to AF   Past Surgical History:  Procedure Laterality Date  . CATARACT EXTRACTION W/ INTRAOCULAR LENS  IMPLANT, BILATERAL    . HERNIA REPAIR Right 1970s  . KNEE SURGERY Right 2000  . MASS EXCISION Left 07/30/2013   Procedure: EXCISION OF BASAL CELL CARCINOMA  LEFT FOREHEAD ;  Surgeon: Irene Limbo, MD;  Location: [facility];  Service: Plastics;  Laterality: Left;  . RETINAL DETACHMENT SURGERY Right 2009    Allergies  Allergen Reactions  . Penicillins Rash  . Bee Venom   . Risperidone And Related     Unstable gait    Outpatient Encounter Medications as of 09/18/2018  Medication Sig  . LORazepam (  ATIVAN) 2 MG/ML concentrated solution Take 0.3 mLs (0.6 mg total) by mouth every 2 (two) hours as needed for anxiety.  . [DISCONTINUED] LORazepam (ATIVAN) 2 MG/ML concentrated solution Take 0.5 mg by mouth every 2 (two) hours as needed for anxiety.  Marland Kitchen acetaminophen (TYLENOL) 325 MG tablet Take 650 mg by mouth 3 (three) times daily.  Marland Kitchen apixaban (ELIQUIS) 2.5 MG TABS tablet Take 2.5 mg by mouth 2 (two) times daily.  Marland Kitchen ipratropium-albuterol (DUONEB) 0.5-2.5 (3) MG/3ML SOLN Take 3 mLs by nebulization every 6  (six) hours. q 6 hrs prn  . lactose free nutrition (BOOST) LIQD Take 237 mLs by mouth daily.   Marland Kitchen morphine (ROXANOL) 20 MG/ML concentrated solution Take 0.25 mLs (5 mg total) by mouth every hour as needed for severe pain.  . nitroGLYCERIN (NITROSTAT) 0.4 MG SL tablet Place 0.4 mg under the tongue every 5 (five) minutes as needed for chest pain.  . potassium chloride SA (K-DUR,KLOR-CON) 20 MEQ tablet Take 40 mEq by mouth daily.  . [DISCONTINUED] morphine (ROXANOL) 20 MG/ML concentrated solution Take 5 mg by mouth every hour as needed for severe pain.    No facility-administered encounter medications on file as of 09/18/2018.     Review of Systems  Unable to perform ROS: Dementia    Immunization History  Administered Date(s) Administered  . Influenza Inj Mdck Quad Pf 04/25/2016  . Influenza Whole 04/20/2013  . Influenza,inj,Quad PF,6+ Mos 04/28/2018  . Influenza-Unspecified 04/12/2014, 04/20/2015, 05/01/2017  . PPD Test 11/29/2012  . Pneumococcal Conjugate-13 08/01/2016  . Pneumococcal Polysaccharide-23 10/07/2017  . Zoster Recombinat (Shingrix) 05/20/2017, 09/30/2017   Pertinent  Health Maintenance Due  Topic Date Due  . INFLUENZA VACCINE  Completed  . PNA vac Low Risk Adult  Completed   Fall Risk  06/03/2018 05/28/2017 06/21/2016  Falls in the past year? 0 No No  Number falls in past yr: 0 - -  Injury with Fall? 0 - -   Functional Status Survey:    There were no vitals filed for this visit. There is no height or weight on file to calculate BMI. Physical Exam Vitals signs and nursing note reviewed.  Constitutional:      General: He is in acute distress.     Appearance: He is not diaphoretic.  HENT:     Head: Normocephalic and atraumatic.     Mouth/Throat:     Mouth: Mucous membranes are dry.     Pharynx: No oropharyngeal exudate.  Eyes:     General:        Right eye: No discharge.        Left eye: No discharge.     Conjunctiva/sclera: Conjunctivae normal.      Pupils: Pupils are equal, round, and reactive to light.  Neck:     Musculoskeletal: Normal range of motion and neck supple.     Thyroid: No thyromegaly.     Vascular: No JVD.     Trachea: No tracheal deviation.  Cardiovascular:     Rate and Rhythm: Normal rate. Rhythm irregular.     Heart sounds: No murmur.  Pulmonary:     Effort: Respiratory distress present.     Breath sounds: Wheezing, rhonchi and rales present.  Abdominal:     General: Bowel sounds are normal. There is no distension.     Palpations: Abdomen is soft.     Tenderness: There is no abdominal tenderness.  Lymphadenopathy:     Cervical: No cervical adenopathy.  Skin:    General:  Skin is warm and dry.  Neurological:     Comments: Lethargic and not able to f/c. Eyes are open but can not track.      Labs reviewed: Recent Labs    08/10/18 08/13/18 09/18/18  NA 142 140 153*  K 4.7 4.5 4.0  BUN 42* 38* 68*  CREATININE 2.0* 2.1* 3.3*   Recent Labs    06/07/18  AST 17  ALT 11  ALKPHOS 114   Recent Labs    06/07/18 08/10/18 08/13/18  WBC 13.9 5.3 6.3  NEUTROABS 12  --   --   HGB 11.2* 10.6* 11.1*  HCT 34* 32* 33*  PLT 185 148* 170   Lab Results  Component Value Date   TSH 4.17 11/13/2017   No results found for: HGBA1C Lab Results  Component Value Date   CHOL 155 08/31/2015   HDL 33 (A) 08/31/2015   LDLCALC 99 08/31/2015   TRIG 116 08/31/2015    Significant Diagnostic Results in last 30 days:  No results found.  Assessment/Plan  1. Dehydration Unfortunately he did not respond to the IVF and appears to be at the end of his life. His family has agreed to stop additional interventions and provide comfort care. No further labs, intake records, weights, etc Refresh 2 gtts both eyes qid prn dry eyes  2. Shortness of breath Increase roxanol to 5 mg q 1 hr prn sob or pain Add ativan 0.5 mg q 2 hrs prn sob or agitation Continue duonebs and oxygen  Discontinue oral meds except above orders Atropine  gtts 4 gtts qid prn excess secretions  3. Late onset Alzheimer's disease with behavioral disturbance ([place]) End stage followed by hospice  4. Weight loss Due to progressive dementia and dysphagia  5. Slow transit constipation Dulcolax supp pr qd prn   Family/ staff Communication: discussed with his wife Izora Gala and his daughter Leodis [place] ordered:  NA

## 2018-09-21 ENCOUNTER — Encounter: Payer: Self-pay | Admitting: Internal Medicine

## 2018-10-07 DEATH — deceased
